# Patient Record
Sex: Female | Born: 1960 | Race: Black or African American | Hispanic: No | Smoking: Never smoker
Health system: Southern US, Community
[De-identification: ages and names within clinical notes are randomized; demographics above are authoritative.]

## PROBLEM LIST (undated history)

## (undated) DIAGNOSIS — F329 Major depressive disorder, single episode, unspecified: Secondary | ICD-10-CM

## (undated) DIAGNOSIS — I10 Essential (primary) hypertension: Secondary | ICD-10-CM

## (undated) DIAGNOSIS — M1990 Unspecified osteoarthritis, unspecified site: Secondary | ICD-10-CM

## (undated) DIAGNOSIS — I714 Abdominal aortic aneurysm, without rupture, unspecified: Secondary | ICD-10-CM

## (undated) DIAGNOSIS — F32A Depression, unspecified: Secondary | ICD-10-CM

## (undated) DIAGNOSIS — F419 Anxiety disorder, unspecified: Secondary | ICD-10-CM

## (undated) DIAGNOSIS — R011 Cardiac murmur, unspecified: Secondary | ICD-10-CM

## (undated) DIAGNOSIS — D860 Sarcoidosis of lung: Secondary | ICD-10-CM

## (undated) DIAGNOSIS — D126 Benign neoplasm of colon, unspecified: Secondary | ICD-10-CM

## (undated) DIAGNOSIS — K279 Peptic ulcer, site unspecified, unspecified as acute or chronic, without hemorrhage or perforation: Secondary | ICD-10-CM

## (undated) DIAGNOSIS — I4892 Unspecified atrial flutter: Secondary | ICD-10-CM

## (undated) DIAGNOSIS — T7840XA Allergy, unspecified, initial encounter: Secondary | ICD-10-CM

## (undated) DIAGNOSIS — H269 Unspecified cataract: Secondary | ICD-10-CM

## (undated) DIAGNOSIS — E042 Nontoxic multinodular goiter: Secondary | ICD-10-CM

## (undated) HISTORY — PX: OTHER SURGICAL HISTORY: SHX169

## (undated) HISTORY — DX: Depression, unspecified: F32.A

## (undated) HISTORY — PX: BUNIONECTOMY: SHX129

## (undated) HISTORY — PX: GLAUCOMA SURGERY: SHX656

## (undated) HISTORY — DX: Unspecified osteoarthritis, unspecified site: M19.90

## (undated) HISTORY — PX: DENTAL SURGERY: SHX609

## (undated) HISTORY — DX: Sarcoidosis of lung: D86.0

## (undated) HISTORY — DX: Abdominal aortic aneurysm, without rupture: I71.4

## (undated) HISTORY — PX: CATARACT EXTRACTION: SUR2

## (undated) HISTORY — DX: Essential (primary) hypertension: I10

## (undated) HISTORY — DX: Allergy, unspecified, initial encounter: T78.40XA

## (undated) HISTORY — PX: MYOMECTOMY: SHX85

## (undated) HISTORY — PX: DILATION AND CURETTAGE OF UTERUS: SHX78

## (undated) HISTORY — DX: Anxiety disorder, unspecified: F41.9

## (undated) HISTORY — DX: Nontoxic multinodular goiter: E04.2

## (undated) HISTORY — DX: Peptic ulcer, site unspecified, unspecified as acute or chronic, without hemorrhage or perforation: K27.9

## (undated) HISTORY — DX: Major depressive disorder, single episode, unspecified: F32.9

## (undated) HISTORY — DX: Benign neoplasm of colon, unspecified: D12.6

## (undated) HISTORY — DX: Cardiac murmur, unspecified: R01.1

## (undated) HISTORY — DX: Unspecified atrial flutter: I48.92

## (undated) HISTORY — DX: Unspecified cataract: H26.9

## (undated) HISTORY — DX: Abdominal aortic aneurysm, without rupture, unspecified: I71.40

---

## 1997-04-13 ENCOUNTER — Ambulatory Visit (HOSPITAL_COMMUNITY): Admission: RE | Admit: 1997-04-13 | Discharge: 1997-04-13 | Payer: Self-pay | Admitting: Obstetrics and Gynecology

## 1998-12-29 ENCOUNTER — Other Ambulatory Visit: Admission: RE | Admit: 1998-12-29 | Discharge: 1998-12-29 | Payer: Self-pay | Admitting: Obstetrics and Gynecology

## 2000-01-22 ENCOUNTER — Other Ambulatory Visit: Admission: RE | Admit: 2000-01-22 | Discharge: 2000-01-22 | Payer: Self-pay | Admitting: Obstetrics and Gynecology

## 2000-05-01 ENCOUNTER — Encounter: Payer: Self-pay | Admitting: Internal Medicine

## 2000-09-26 ENCOUNTER — Ambulatory Visit: Admission: RE | Admit: 2000-09-26 | Discharge: 2000-09-26 | Payer: Self-pay | Admitting: Critical Care Medicine

## 2000-11-27 ENCOUNTER — Encounter: Admission: RE | Admit: 2000-11-27 | Discharge: 2001-02-25 | Payer: Self-pay | Admitting: Internal Medicine

## 2001-02-13 ENCOUNTER — Other Ambulatory Visit: Admission: RE | Admit: 2001-02-13 | Discharge: 2001-02-13 | Payer: Self-pay | Admitting: Obstetrics and Gynecology

## 2001-03-04 ENCOUNTER — Encounter: Admission: RE | Admit: 2001-03-04 | Discharge: 2001-06-02 | Payer: Self-pay | Admitting: Internal Medicine

## 2001-05-19 ENCOUNTER — Encounter: Payer: Self-pay | Admitting: Surgery

## 2001-05-19 ENCOUNTER — Encounter (INDEPENDENT_AMBULATORY_CARE_PROVIDER_SITE_OTHER): Payer: Self-pay | Admitting: Specialist

## 2001-05-19 ENCOUNTER — Encounter: Admission: RE | Admit: 2001-05-19 | Discharge: 2001-05-19 | Payer: Self-pay | Admitting: Surgery

## 2001-09-07 ENCOUNTER — Encounter: Admission: RE | Admit: 2001-09-07 | Discharge: 2001-10-28 | Payer: Self-pay | Admitting: Internal Medicine

## 2001-11-09 ENCOUNTER — Encounter: Admission: RE | Admit: 2001-11-09 | Discharge: 2002-02-07 | Payer: Self-pay | Admitting: Internal Medicine

## 2002-02-26 ENCOUNTER — Other Ambulatory Visit: Admission: RE | Admit: 2002-02-26 | Discharge: 2002-02-26 | Payer: Self-pay | Admitting: Obstetrics and Gynecology

## 2002-05-07 ENCOUNTER — Ambulatory Visit (HOSPITAL_BASED_OUTPATIENT_CLINIC_OR_DEPARTMENT_OTHER): Admission: RE | Admit: 2002-05-07 | Discharge: 2002-05-07 | Payer: Self-pay | Admitting: Critical Care Medicine

## 2002-09-22 ENCOUNTER — Emergency Department (HOSPITAL_COMMUNITY): Admission: EM | Admit: 2002-09-22 | Discharge: 2002-09-22 | Payer: Self-pay | Admitting: Emergency Medicine

## 2003-03-25 ENCOUNTER — Other Ambulatory Visit: Admission: RE | Admit: 2003-03-25 | Discharge: 2003-03-25 | Payer: Self-pay | Admitting: Obstetrics and Gynecology

## 2003-11-17 ENCOUNTER — Ambulatory Visit: Payer: Self-pay | Admitting: Internal Medicine

## 2004-01-27 ENCOUNTER — Ambulatory Visit: Payer: Self-pay | Admitting: Internal Medicine

## 2004-02-17 ENCOUNTER — Ambulatory Visit: Payer: Self-pay | Admitting: Internal Medicine

## 2004-02-21 ENCOUNTER — Ambulatory Visit: Payer: Self-pay | Admitting: Critical Care Medicine

## 2004-03-18 ENCOUNTER — Emergency Department (HOSPITAL_COMMUNITY): Admission: EM | Admit: 2004-03-18 | Discharge: 2004-03-19 | Payer: Self-pay | Admitting: Emergency Medicine

## 2004-04-03 ENCOUNTER — Other Ambulatory Visit: Admission: RE | Admit: 2004-04-03 | Discharge: 2004-04-03 | Payer: Self-pay | Admitting: Obstetrics and Gynecology

## 2004-05-03 ENCOUNTER — Ambulatory Visit: Payer: Self-pay | Admitting: Internal Medicine

## 2004-07-26 ENCOUNTER — Ambulatory Visit: Payer: Self-pay | Admitting: Critical Care Medicine

## 2004-08-06 ENCOUNTER — Ambulatory Visit: Payer: Self-pay | Admitting: Cardiology

## 2004-08-07 ENCOUNTER — Ambulatory Visit: Payer: Self-pay | Admitting: Internal Medicine

## 2004-09-03 ENCOUNTER — Ambulatory Visit: Payer: Self-pay | Admitting: Critical Care Medicine

## 2004-11-05 ENCOUNTER — Ambulatory Visit: Payer: Self-pay | Admitting: Internal Medicine

## 2005-01-30 ENCOUNTER — Ambulatory Visit: Payer: Self-pay | Admitting: Critical Care Medicine

## 2005-02-07 ENCOUNTER — Ambulatory Visit: Payer: Self-pay | Admitting: Internal Medicine

## 2005-05-08 ENCOUNTER — Ambulatory Visit: Payer: Self-pay | Admitting: Internal Medicine

## 2005-05-08 ENCOUNTER — Other Ambulatory Visit: Admission: RE | Admit: 2005-05-08 | Discharge: 2005-05-08 | Payer: Self-pay | Admitting: Obstetrics and Gynecology

## 2005-05-16 ENCOUNTER — Ambulatory Visit: Payer: Self-pay | Admitting: Critical Care Medicine

## 2005-07-01 ENCOUNTER — Encounter: Admission: RE | Admit: 2005-07-01 | Discharge: 2005-07-01 | Payer: Self-pay | Admitting: Obstetrics and Gynecology

## 2005-08-22 ENCOUNTER — Ambulatory Visit: Payer: Self-pay | Admitting: Internal Medicine

## 2005-09-25 ENCOUNTER — Ambulatory Visit: Payer: Self-pay | Admitting: Critical Care Medicine

## 2005-10-02 ENCOUNTER — Ambulatory Visit: Payer: Self-pay | Admitting: Internal Medicine

## 2005-10-21 ENCOUNTER — Ambulatory Visit: Payer: Self-pay | Admitting: Critical Care Medicine

## 2005-11-28 ENCOUNTER — Ambulatory Visit: Payer: Self-pay | Admitting: Gastroenterology

## 2005-12-26 ENCOUNTER — Ambulatory Visit: Payer: Self-pay | Admitting: Gastroenterology

## 2006-01-02 ENCOUNTER — Ambulatory Visit: Payer: Self-pay | Admitting: Internal Medicine

## 2006-01-02 LAB — CONVERTED CEMR LAB
ALT: 23 units/L (ref 0–40)
AST: 25 units/L (ref 0–37)
Albumin: 3.3 g/dL — ABNORMAL LOW (ref 3.5–5.2)
Alkaline Phosphatase: 42 units/L (ref 39–117)
Basophils Absolute: 0.1 10*3/uL (ref 0.0–0.1)
Basophils Relative: 1.2 % — ABNORMAL HIGH (ref 0.0–1.0)
Chloride: 103 meq/L (ref 96–112)
Cholesterol: 137 mg/dL (ref 0–200)
Creatinine, Ser: 0.9 mg/dL (ref 0.4–1.2)
Eosinophil percent: 2.1 % (ref 0.0–5.0)
GFR calc non Af Amer: 72 mL/min
Glomerular Filtration Rate, Af Am: 87 mL/min/{1.73_m2}
Hemoglobin, Urine: NEGATIVE
Ketones, ur: NEGATIVE mg/dL
MCHC: 34.1 g/dL (ref 30.0–36.0)
Nitrite: NEGATIVE
Sodium: 137 meq/L (ref 135–145)
Specific Gravity, Urine: <=1.005 (ref 1.000–1.03)
TSH: 1.36 microintl units/mL (ref 0.35–5.50)
Total Protein, Urine: NEGATIVE mg/dL
Total Protein: 6.8 g/dL (ref 6.0–8.3)
Triglyceride fasting, serum: 86 mg/dL (ref 0–149)
Urine Glucose: NEGATIVE mg/dL
WBC: 7.2 10*3/uL (ref 4.5–10.5)
pH: 7 (ref 5.0–8.0)

## 2006-01-23 ENCOUNTER — Ambulatory Visit: Payer: Self-pay | Admitting: Critical Care Medicine

## 2006-02-19 ENCOUNTER — Ambulatory Visit: Payer: Self-pay | Admitting: Internal Medicine

## 2006-02-19 LAB — CONVERTED CEMR LAB
BUN: 8 mg/dL (ref 6–23)
Calcium: 9.5 mg/dL (ref 8.4–10.5)
Chloride: 108 meq/L (ref 96–112)
Creatinine, Ser: 0.9 mg/dL (ref 0.4–1.2)

## 2006-04-03 ENCOUNTER — Ambulatory Visit: Payer: Self-pay | Admitting: Internal Medicine

## 2006-04-03 LAB — CONVERTED CEMR LAB
BUN: 8 mg/dL (ref 6–23)
CO2: 29 meq/L (ref 19–32)
Calcium: 9.5 mg/dL (ref 8.4–10.5)
GFR calc non Af Amer: 82 mL/min
PRA: 2
Potassium Urine: 25 mmol/L
Potassium: 3.9 meq/L (ref 3.5–5.1)
TSH: 1.53 microintl units/mL (ref 0.35–5.50)

## 2006-04-14 ENCOUNTER — Encounter: Payer: Self-pay | Admitting: Internal Medicine

## 2006-04-14 LAB — CONVERTED CEMR LAB: Dopamine 24 Hr Urine: 145 mcg/24hr (ref <500)

## 2006-06-05 ENCOUNTER — Ambulatory Visit: Payer: Self-pay | Admitting: Critical Care Medicine

## 2006-07-07 LAB — HM MAMMOGRAPHY: HM Mammogram: NORMAL

## 2006-08-12 ENCOUNTER — Ambulatory Visit: Payer: Self-pay | Admitting: Internal Medicine

## 2006-08-12 LAB — CONVERTED CEMR LAB
Bacteria, UA: NEGATIVE
Bilirubin Urine: NEGATIVE
Crystals: NEGATIVE
Hemoglobin, Urine: NEGATIVE
Ketones, ur: NEGATIVE mg/dL
Mucus, UA: NEGATIVE
Nitrite: NEGATIVE
RBC / HPF: NONE SEEN
Specific Gravity, Urine: <=1.005 (ref 1.000–1.03)
Total Protein, Urine: NEGATIVE mg/dL
Urine Glucose: NEGATIVE mg/dL
Urobilinogen, UA: 0.2 (ref 0.0–1.0)
pH: 7 (ref 5.0–8.0)

## 2006-09-05 ENCOUNTER — Ambulatory Visit: Payer: Self-pay | Admitting: Critical Care Medicine

## 2006-11-06 DIAGNOSIS — H409 Unspecified glaucoma: Secondary | ICD-10-CM | POA: Insufficient documentation

## 2006-11-06 DIAGNOSIS — M810 Age-related osteoporosis without current pathological fracture: Secondary | ICD-10-CM | POA: Insufficient documentation

## 2006-11-06 DIAGNOSIS — I10 Essential (primary) hypertension: Secondary | ICD-10-CM | POA: Insufficient documentation

## 2006-12-05 ENCOUNTER — Telehealth: Payer: Self-pay | Admitting: Critical Care Medicine

## 2006-12-09 ENCOUNTER — Ambulatory Visit: Payer: Self-pay | Admitting: Critical Care Medicine

## 2006-12-10 ENCOUNTER — Ambulatory Visit: Payer: Self-pay | Admitting: Internal Medicine

## 2006-12-10 DIAGNOSIS — J4540 Moderate persistent asthma, uncomplicated: Secondary | ICD-10-CM | POA: Insufficient documentation

## 2006-12-10 DIAGNOSIS — B353 Tinea pedis: Secondary | ICD-10-CM | POA: Insufficient documentation

## 2006-12-10 DIAGNOSIS — F418 Other specified anxiety disorders: Secondary | ICD-10-CM | POA: Insufficient documentation

## 2006-12-19 ENCOUNTER — Encounter: Payer: Self-pay | Admitting: Internal Medicine

## 2007-03-11 ENCOUNTER — Ambulatory Visit: Payer: Self-pay | Admitting: Internal Medicine

## 2007-03-18 LAB — CONVERTED CEMR LAB
Basophils Absolute: 0.1 10*3/uL (ref 0.0–0.1)
Bilirubin, Direct: 0.1 mg/dL (ref 0.0–0.3)
Calcium: 9.1 mg/dL (ref 8.4–10.5)
Eosinophils Absolute: 0.2 10*3/uL (ref 0.0–0.6)
Eosinophils Relative: 2.2 % (ref 0.0–5.0)
GFR calc Af Amer: 99 mL/min
GFR calc non Af Amer: 82 mL/min
Glucose, Bld: 87 mg/dL (ref 70–99)
Lymphocytes Relative: 23.5 % (ref 12.0–46.0)
MCHC: 33 g/dL (ref 30.0–36.0)
MCV: 91.4 fL (ref 78.0–100.0)
Neutro Abs: 4.3 10*3/uL (ref 1.4–7.7)
Neutrophils Relative %: 60.6 % (ref 43.0–77.0)
Platelets: 343 10*3/uL (ref 150–400)
Sodium: 139 meq/L (ref 135–145)
TSH: 1.73 microintl units/mL (ref 0.35–5.50)
WBC: 7 10*3/uL (ref 4.5–10.5)

## 2007-03-24 ENCOUNTER — Ambulatory Visit: Payer: Self-pay | Admitting: Internal Medicine

## 2007-03-24 DIAGNOSIS — M1990 Unspecified osteoarthritis, unspecified site: Secondary | ICD-10-CM | POA: Insufficient documentation

## 2007-03-24 DIAGNOSIS — J309 Allergic rhinitis, unspecified: Secondary | ICD-10-CM | POA: Insufficient documentation

## 2007-03-24 DIAGNOSIS — M79609 Pain in unspecified limb: Secondary | ICD-10-CM | POA: Insufficient documentation

## 2007-04-08 ENCOUNTER — Telehealth: Payer: Self-pay | Admitting: Internal Medicine

## 2007-04-09 ENCOUNTER — Ambulatory Visit: Payer: Self-pay | Admitting: Internal Medicine

## 2007-04-09 DIAGNOSIS — R002 Palpitations: Secondary | ICD-10-CM | POA: Insufficient documentation

## 2007-04-10 ENCOUNTER — Telehealth: Payer: Self-pay | Admitting: Internal Medicine

## 2007-04-13 ENCOUNTER — Telehealth: Payer: Self-pay | Admitting: Internal Medicine

## 2007-05-05 ENCOUNTER — Encounter: Payer: Self-pay | Admitting: Critical Care Medicine

## 2007-05-18 ENCOUNTER — Ambulatory Visit: Payer: Self-pay | Admitting: Critical Care Medicine

## 2007-06-18 ENCOUNTER — Ambulatory Visit: Payer: Self-pay | Admitting: Internal Medicine

## 2007-06-28 DIAGNOSIS — D8685 Sarcoid myocarditis: Secondary | ICD-10-CM | POA: Insufficient documentation

## 2007-06-28 DIAGNOSIS — D869 Sarcoidosis, unspecified: Secondary | ICD-10-CM

## 2007-07-16 ENCOUNTER — Telehealth (INDEPENDENT_AMBULATORY_CARE_PROVIDER_SITE_OTHER): Payer: Self-pay | Admitting: *Deleted

## 2007-07-16 ENCOUNTER — Ambulatory Visit: Payer: Self-pay | Admitting: Internal Medicine

## 2007-07-17 ENCOUNTER — Ambulatory Visit: Payer: Self-pay | Admitting: Family Medicine

## 2007-07-17 DIAGNOSIS — M6740 Ganglion, unspecified site: Secondary | ICD-10-CM | POA: Insufficient documentation

## 2007-08-05 ENCOUNTER — Ambulatory Visit: Payer: Self-pay | Admitting: Internal Medicine

## 2007-08-05 LAB — CONVERTED CEMR LAB
Basophils Absolute: 0.1 10*3/uL (ref 0.0–0.1)
Calcium: 9.9 mg/dL (ref 8.4–10.5)
Cholesterol: 139 mg/dL (ref 0–200)
GFR calc Af Amer: 62 mL/min
Glucose, Bld: 98 mg/dL (ref 70–99)
HCT: 39.7 % (ref 36.0–46.0)
Hemoglobin: 13.7 g/dL (ref 12.0–15.0)
MCHC: 34.6 g/dL (ref 30.0–36.0)
Monocytes Absolute: 0.8 10*3/uL (ref 0.1–1.0)
Monocytes Relative: 10.3 % (ref 3.0–12.0)
Neutro Abs: 5 10*3/uL (ref 1.4–7.7)
RDW: 13.9 % (ref 11.5–14.6)
Sodium: 142 meq/L (ref 135–145)
TSH: 2.18 microintl units/mL (ref 0.35–5.50)
Triglycerides: 109 mg/dL (ref 0–149)

## 2007-08-19 ENCOUNTER — Ambulatory Visit: Payer: Self-pay | Admitting: Internal Medicine

## 2007-09-18 ENCOUNTER — Ambulatory Visit: Payer: Self-pay | Admitting: Critical Care Medicine

## 2007-10-16 ENCOUNTER — Encounter: Payer: Self-pay | Admitting: Internal Medicine

## 2007-10-21 ENCOUNTER — Encounter (INDEPENDENT_AMBULATORY_CARE_PROVIDER_SITE_OTHER): Payer: Self-pay | Admitting: Obstetrics and Gynecology

## 2007-10-21 ENCOUNTER — Ambulatory Visit (HOSPITAL_COMMUNITY): Admission: RE | Admit: 2007-10-21 | Discharge: 2007-10-21 | Payer: Self-pay | Admitting: Obstetrics and Gynecology

## 2007-11-09 ENCOUNTER — Ambulatory Visit: Payer: Self-pay | Admitting: Internal Medicine

## 2007-11-09 ENCOUNTER — Ambulatory Visit: Payer: Self-pay | Admitting: Gastroenterology

## 2007-11-09 DIAGNOSIS — R198 Other specified symptoms and signs involving the digestive system and abdomen: Secondary | ICD-10-CM | POA: Insufficient documentation

## 2007-12-02 ENCOUNTER — Ambulatory Visit: Payer: Self-pay | Admitting: Internal Medicine

## 2007-12-02 LAB — CONVERTED CEMR LAB
ALT: 18 units/L (ref 0–35)
Albumin: 3.5 g/dL (ref 3.5–5.2)
CO2: 28 meq/L (ref 19–32)
Calcium: 9.1 mg/dL (ref 8.4–10.5)
Creatinine, Ser: 1 mg/dL (ref 0.4–1.2)
Glucose, Bld: 93 mg/dL (ref 70–99)
TSH: 1.99 microintl units/mL (ref 0.35–5.50)
Total Bilirubin: 0.6 mg/dL (ref 0.3–1.2)
Total Protein: 6.9 g/dL (ref 6.0–8.3)

## 2007-12-07 ENCOUNTER — Ambulatory Visit: Payer: Self-pay | Admitting: Internal Medicine

## 2007-12-07 DIAGNOSIS — R5381 Other malaise: Secondary | ICD-10-CM | POA: Insufficient documentation

## 2007-12-07 DIAGNOSIS — R000 Tachycardia, unspecified: Secondary | ICD-10-CM | POA: Insufficient documentation

## 2007-12-07 DIAGNOSIS — R5383 Other fatigue: Secondary | ICD-10-CM

## 2007-12-28 ENCOUNTER — Ambulatory Visit: Payer: Self-pay | Admitting: Internal Medicine

## 2007-12-28 ENCOUNTER — Telehealth: Payer: Self-pay | Admitting: Internal Medicine

## 2007-12-29 ENCOUNTER — Telehealth: Payer: Self-pay | Admitting: Internal Medicine

## 2008-02-08 ENCOUNTER — Ambulatory Visit: Payer: Self-pay | Admitting: Internal Medicine

## 2008-02-11 ENCOUNTER — Telehealth: Payer: Self-pay | Admitting: Internal Medicine

## 2008-03-09 ENCOUNTER — Ambulatory Visit: Payer: Self-pay | Admitting: Internal Medicine

## 2008-03-22 ENCOUNTER — Encounter: Payer: Self-pay | Admitting: Internal Medicine

## 2008-05-03 ENCOUNTER — Ambulatory Visit: Payer: Self-pay | Admitting: Critical Care Medicine

## 2008-05-05 ENCOUNTER — Encounter: Payer: Self-pay | Admitting: Critical Care Medicine

## 2008-05-23 ENCOUNTER — Telehealth: Payer: Self-pay | Admitting: Internal Medicine

## 2008-05-24 ENCOUNTER — Ambulatory Visit: Payer: Self-pay | Admitting: Internal Medicine

## 2008-05-24 ENCOUNTER — Telehealth: Payer: Self-pay | Admitting: Internal Medicine

## 2008-05-24 DIAGNOSIS — N959 Unspecified menopausal and perimenopausal disorder: Secondary | ICD-10-CM | POA: Insufficient documentation

## 2008-05-26 LAB — CONVERTED CEMR LAB
AST: 28 units/L (ref 0–37)
Alkaline Phosphatase: 60 units/L (ref 39–117)
Basophils Absolute: 0 10*3/uL (ref 0.0–0.1)
Ketones, ur: NEGATIVE mg/dL
Lymphocytes Relative: 24 % (ref 12.0–46.0)
Monocytes Relative: 8.1 % (ref 3.0–12.0)
Neutrophils Relative %: 64.2 % (ref 43.0–77.0)
Platelets: 267 10*3/uL (ref 150.0–400.0)
RDW: 13.7 % (ref 11.5–14.6)
Specific Gravity, Urine: <=1.005 (ref 1.000–1.030)
Total Bilirubin: 0.7 mg/dL (ref 0.3–1.2)
Urine Glucose: NEGATIVE mg/dL
Vitamin B-12: 1147 pg/mL — ABNORMAL HIGH (ref 211–911)
pH: 7.5 (ref 5.0–8.0)

## 2008-06-08 ENCOUNTER — Ambulatory Visit: Payer: Self-pay | Admitting: Internal Medicine

## 2008-07-06 ENCOUNTER — Telehealth: Payer: Self-pay | Admitting: Internal Medicine

## 2008-08-29 ENCOUNTER — Ambulatory Visit: Payer: Self-pay | Admitting: Internal Medicine

## 2008-08-29 LAB — CONVERTED CEMR LAB
CO2: 32 meq/L (ref 19–32)
Calcium: 9.4 mg/dL (ref 8.4–10.5)
Chloride: 100 meq/L (ref 96–112)
Sodium: 139 meq/L (ref 135–145)
TSH: 2.2 microintl units/mL (ref 0.35–5.50)

## 2008-08-31 ENCOUNTER — Ambulatory Visit: Payer: Self-pay | Admitting: Internal Medicine

## 2008-08-31 ENCOUNTER — Ambulatory Visit: Payer: Self-pay | Admitting: Critical Care Medicine

## 2008-09-06 ENCOUNTER — Ambulatory Visit: Payer: Self-pay | Admitting: Internal Medicine

## 2008-09-15 ENCOUNTER — Ambulatory Visit: Payer: Self-pay | Admitting: Critical Care Medicine

## 2008-09-16 ENCOUNTER — Encounter: Payer: Self-pay | Admitting: Critical Care Medicine

## 2008-11-21 ENCOUNTER — Ambulatory Visit: Payer: Self-pay | Admitting: Internal Medicine

## 2008-11-22 LAB — CONVERTED CEMR LAB
Calcium: 9.3 mg/dL (ref 8.4–10.5)
GFR calc non Af Amer: 97.91 mL/min (ref >60)
Glucose, Bld: 103 mg/dL — ABNORMAL HIGH (ref 70–99)
Potassium: 3.2 meq/L — ABNORMAL LOW (ref 3.5–5.1)
Sodium: 140 meq/L (ref 135–145)

## 2008-11-24 ENCOUNTER — Ambulatory Visit: Payer: Self-pay | Admitting: Internal Medicine

## 2008-11-24 DIAGNOSIS — E876 Hypokalemia: Secondary | ICD-10-CM | POA: Insufficient documentation

## 2009-01-04 ENCOUNTER — Telehealth (INDEPENDENT_AMBULATORY_CARE_PROVIDER_SITE_OTHER): Payer: Self-pay | Admitting: *Deleted

## 2009-01-11 ENCOUNTER — Telehealth: Payer: Self-pay | Admitting: Internal Medicine

## 2009-01-11 ENCOUNTER — Ambulatory Visit: Payer: Self-pay | Admitting: Critical Care Medicine

## 2009-01-11 DIAGNOSIS — J018 Other acute sinusitis: Secondary | ICD-10-CM | POA: Insufficient documentation

## 2009-01-23 ENCOUNTER — Telehealth: Payer: Self-pay | Admitting: Pulmonary Disease

## 2009-01-26 ENCOUNTER — Telehealth: Payer: Self-pay | Admitting: Gastroenterology

## 2009-02-20 ENCOUNTER — Ambulatory Visit: Payer: Self-pay | Admitting: Internal Medicine

## 2009-02-20 LAB — CONVERTED CEMR LAB
BUN: 10 mg/dL (ref 6–23)
Chloride: 103 meq/L (ref 96–112)
GFR calc non Af Amer: 75.61 mL/min (ref >60)
Potassium: 3.9 meq/L (ref 3.5–5.1)
Sodium: 144 meq/L (ref 135–145)
TSH: 3.06 microintl units/mL (ref 0.35–5.50)

## 2009-02-23 ENCOUNTER — Ambulatory Visit: Payer: Self-pay | Admitting: Internal Medicine

## 2009-02-23 DIAGNOSIS — J019 Acute sinusitis, unspecified: Secondary | ICD-10-CM | POA: Insufficient documentation

## 2009-02-23 DIAGNOSIS — F4321 Adjustment disorder with depressed mood: Secondary | ICD-10-CM | POA: Insufficient documentation

## 2009-03-06 ENCOUNTER — Telehealth (INDEPENDENT_AMBULATORY_CARE_PROVIDER_SITE_OTHER): Payer: Self-pay | Admitting: *Deleted

## 2009-03-06 ENCOUNTER — Telehealth: Payer: Self-pay | Admitting: Critical Care Medicine

## 2009-04-18 ENCOUNTER — Ambulatory Visit: Payer: Self-pay | Admitting: Internal Medicine

## 2009-04-18 LAB — CONVERTED CEMR LAB
GFR calc non Af Amer: 75.56 mL/min (ref >60)
Potassium: 3.6 meq/L (ref 3.5–5.1)
Sodium: 141 meq/L (ref 135–145)

## 2009-04-23 LAB — CONVERTED CEMR LAB
Alkaline Phosphatase: 71 units/L (ref 39–117)
Bilirubin, Direct: 0 mg/dL (ref 0.0–0.3)
TSH: 2.02 microintl units/mL (ref 0.35–5.50)

## 2009-04-25 ENCOUNTER — Ambulatory Visit: Payer: Self-pay | Admitting: Critical Care Medicine

## 2009-06-30 ENCOUNTER — Encounter: Payer: Self-pay | Admitting: Internal Medicine

## 2009-07-03 ENCOUNTER — Ambulatory Visit: Payer: Self-pay | Admitting: Pulmonary Disease

## 2009-07-06 ENCOUNTER — Telehealth: Payer: Self-pay | Admitting: Critical Care Medicine

## 2009-07-18 ENCOUNTER — Ambulatory Visit: Payer: Self-pay | Admitting: Internal Medicine

## 2009-07-18 LAB — CONVERTED CEMR LAB
BUN: 15 mg/dL (ref 6–23)
Chloride: 104 meq/L (ref 96–112)
Glucose, Bld: 102 mg/dL — ABNORMAL HIGH (ref 70–99)
Potassium: 3.8 meq/L (ref 3.5–5.1)

## 2009-07-20 ENCOUNTER — Ambulatory Visit: Payer: Self-pay | Admitting: Internal Medicine

## 2009-09-04 ENCOUNTER — Ambulatory Visit: Payer: Self-pay | Admitting: Critical Care Medicine

## 2009-09-20 ENCOUNTER — Ambulatory Visit: Payer: Self-pay | Admitting: Critical Care Medicine

## 2009-09-22 ENCOUNTER — Encounter: Payer: Self-pay | Admitting: Critical Care Medicine

## 2009-10-02 ENCOUNTER — Telehealth: Payer: Self-pay | Admitting: Internal Medicine

## 2009-10-20 ENCOUNTER — Ambulatory Visit: Payer: Self-pay | Admitting: Internal Medicine

## 2009-10-20 DIAGNOSIS — M26629 Arthralgia of temporomandibular joint, unspecified side: Secondary | ICD-10-CM | POA: Insufficient documentation

## 2009-12-14 ENCOUNTER — Ambulatory Visit: Payer: Self-pay | Admitting: Critical Care Medicine

## 2010-01-31 ENCOUNTER — Ambulatory Visit
Admission: RE | Admit: 2010-01-31 | Discharge: 2010-01-31 | Payer: Self-pay | Source: Home / Self Care | Attending: Internal Medicine | Admitting: Internal Medicine

## 2010-01-31 ENCOUNTER — Other Ambulatory Visit: Payer: Self-pay | Admitting: Internal Medicine

## 2010-01-31 LAB — BASIC METABOLIC PANEL
BUN: 13 mg/dL (ref 6–23)
CO2: 30 mEq/L (ref 19–32)
Calcium: 10.1 mg/dL (ref 8.4–10.5)
Chloride: 104 mEq/L (ref 96–112)
Creatinine, Ser: 0.9 mg/dL (ref 0.4–1.2)
GFR: 80.89 mL/min (ref >60.00)
Glucose, Bld: 84 mg/dL (ref 70–99)
Potassium: 4 mEq/L (ref 3.5–5.1)
Sodium: 141 mEq/L (ref 135–145)

## 2010-01-31 LAB — CBC WITH DIFFERENTIAL/PLATELET
Basophils Absolute: 0.1 10*3/uL (ref 0.0–0.1)
Basophils Relative: 1.1 % (ref 0.0–3.0)
Eosinophils Absolute: 0.1 10*3/uL (ref 0.0–0.7)
Eosinophils Relative: 2.7 % (ref 0.0–5.0)
HCT: 41.5 % (ref 36.0–46.0)
Hemoglobin: 14.3 g/dL (ref 12.0–15.0)
Lymphocytes Relative: 33.1 % (ref 12.0–46.0)
Lymphs Abs: 1.8 10*3/uL (ref 0.7–4.0)
MCHC: 34.5 g/dL (ref 30.0–36.0)
MCV: 93.3 fl (ref 78.0–100.0)
Monocytes Absolute: 0.6 10*3/uL (ref 0.1–1.0)
Monocytes Relative: 11 % (ref 3.0–12.0)
Neutro Abs: 2.8 10*3/uL (ref 1.4–7.7)
Neutrophils Relative %: 52.1 % (ref 43.0–77.0)
Platelets: 314 10*3/uL (ref 150.0–400.0)
RBC: 4.44 Mil/uL (ref 3.87–5.11)
RDW: 14.5 % (ref 11.5–14.6)
WBC: 5.4 10*3/uL (ref 4.5–10.5)

## 2010-01-31 LAB — URINALYSIS, ROUTINE W REFLEX MICROSCOPIC
Bilirubin Urine: NEGATIVE
Hemoglobin, Urine: NEGATIVE
Ketones, ur: NEGATIVE
Nitrite: NEGATIVE
Specific Gravity, Urine: <=1.005 (ref 1.000–1.030)
Total Protein, Urine: NEGATIVE
Urine Glucose: NEGATIVE
Urobilinogen, UA: 0.2 (ref 0.0–1.0)
pH: 7 (ref 5.0–8.0)

## 2010-01-31 LAB — LIPID PANEL
Cholesterol: 158 mg/dL (ref 0–200)
HDL: 38.5 mg/dL — ABNORMAL LOW (ref >39.00)
LDL Cholesterol: 102 mg/dL — ABNORMAL HIGH (ref 0–99)
Total CHOL/HDL Ratio: 4
Triglycerides: 87 mg/dL (ref 0.0–149.0)
VLDL: 17.4 mg/dL (ref 0.0–40.0)

## 2010-01-31 LAB — HEPATIC FUNCTION PANEL
ALT: 37 U/L — ABNORMAL HIGH (ref 0–35)
AST: 31 U/L (ref 0–37)
Albumin: 4 g/dL (ref 3.5–5.2)
Alkaline Phosphatase: 68 U/L (ref 39–117)
Bilirubin, Direct: 0.1 mg/dL (ref 0.0–0.3)
Total Bilirubin: 0.8 mg/dL (ref 0.3–1.2)
Total Protein: 7 g/dL (ref 6.0–8.3)

## 2010-01-31 LAB — TSH: TSH: 2.37 u[IU]/mL (ref 0.35–5.50)

## 2010-02-04 ENCOUNTER — Encounter: Payer: Self-pay | Admitting: Obstetrics and Gynecology

## 2010-02-05 ENCOUNTER — Ambulatory Visit
Admission: RE | Admit: 2010-02-05 | Discharge: 2010-02-05 | Payer: Self-pay | Source: Home / Self Care | Attending: Internal Medicine | Admitting: Internal Medicine

## 2010-02-05 ENCOUNTER — Encounter: Payer: Self-pay | Admitting: Internal Medicine

## 2010-02-08 ENCOUNTER — Encounter: Payer: Self-pay | Admitting: Internal Medicine

## 2010-02-08 ENCOUNTER — Ambulatory Visit
Admission: RE | Admit: 2010-02-08 | Discharge: 2010-02-08 | Payer: Self-pay | Source: Home / Self Care | Attending: Internal Medicine | Admitting: Internal Medicine

## 2010-02-09 ENCOUNTER — Telehealth: Payer: Self-pay | Admitting: Internal Medicine

## 2010-02-11 LAB — CONVERTED CEMR LAB
GFR calc Af Amer: 86 mL/min
GFR calc non Af Amer: 71 mL/min
Magnesium: 1.9 mg/dL (ref 1.5–2.5)
Potassium: 3.4 meq/L — ABNORMAL LOW (ref 3.5–5.1)
Sodium: 138 meq/L (ref 135–145)

## 2010-02-12 ENCOUNTER — Telehealth: Payer: Self-pay | Admitting: Internal Medicine

## 2010-02-13 ENCOUNTER — Ambulatory Visit
Admission: RE | Admit: 2010-02-13 | Discharge: 2010-02-13 | Payer: Self-pay | Source: Home / Self Care | Attending: Internal Medicine | Admitting: Internal Medicine

## 2010-02-13 DIAGNOSIS — R519 Headache, unspecified: Secondary | ICD-10-CM | POA: Insufficient documentation

## 2010-02-13 DIAGNOSIS — R51 Headache: Secondary | ICD-10-CM | POA: Insufficient documentation

## 2010-02-15 NOTE — Progress Notes (Signed)
Summary: meds  Phone Note Call from Patient Call back at [phone]   Caller: Patient Call For: wright Reason for Call: Talk to Nurse, Talk to Doctor Summary of Call: patient feels like she has the flu, says she spoke to nurse earlier regarding this.Wants to discuss meds. Initial call taken by: Eugene Gavia,  March 06, 2009 2:13 PM  Follow-up for Phone Call        ansewered questions for pt about ibuprofen and tylenol, mucinex and cough syrup Follow-up by: Philipp Deputy CMA,  March 06, 2009 4:23 PM

## 2010-02-15 NOTE — Assessment & Plan Note (Signed)
Summary: NP follow up - sarcoid   Primary Provider/Referring Provider:  Plotnikov  CC:  follow up.  states breathing is doing well.  no new complaints.  would like to see cxr from august and states was told it was "different" than previous.[name]  History of Present Illness: Pulmonary OV: [name] is a  50 year-old [place] American  female with a history of sarcoidosis.    January 11, 2009 4:49 PM Pt does ok except at night gets thick mucous in sinuses.   There is some pressure in sinuses. The pt  hurts across the both frontals and r maxillary sinus Pt had depo injection at [facility] two days ago plus mucinex  April 25, 2009 1:33 PM F/u sarcoid.  Dyspnea only with exertion fast or upstairs.  Sinus pressure is less.  Has sneezing and pndrip esp if outdoors.  Needs clariten rx.   This helps the rhinitis.  No real cough and if does is dry.  No f/c/s. Has occ hot flashes.   07/03/09 Acute OV  KC: the pt is a 50 y/o female who comes in today for an acute sick visit.  She has known sarcoidosis that has been fairly stable, but also has recurrent issues with sinusitis.  She began to have sinus issues about 2-3 weeks ago, and was treated at Urgent care with a zpak.  Despite taking this, she has continued to have nasal congestion, postnasal drip, and cough with purulent material.  She also has some chest congestion, as well as doe above her usual baseline.  She denies any f/c/s.  September 04, 2009 11:39 AM Pt seen 6/20 for acute ov and rx levaquin and pulse pred with sinus rinse. Now is better.  Less cough .  Has stress at work.  Wants to exercise more . 12/14/09--Presents for follow up.  States breathing is doing well.  no new complaints.  would like to see cxr from august, states was told it was "different" than previous. We reviewed her xray which showed chronic sarcoid changes w/ slight progression of scarring in RUL. She is active we talked about advancing her exericse as tolerated. Denies chest pain,   orthopnea, hemoptysis, fever, n/v/d, edema, headache.   Medications Prior to Update: 1)  Bystolic 20 Mg Tabs (Nebivolol Hcl) .[name].. 1 By Mouth Qd 2)  Norvasc 5 Mg  Tabs (Amlodipine Besylate) .... 1/2 By Mouth Daily 3)  Valtrex 1 Gm Tabs (Valacyclovir Hcl) .... 1/2 Tab Two Times A Day 4)  Cosopt 2-0.5 %  Soln (Dorzolamide-Timolol) .[name].. 1 Gtt Ou Two Times A Day in Left Eye 5)  K-Vescent 25 Meq  Tbef (Potassium Bicarbonate) .... Take 1 Tablet By Mouth Bid 6)  Claritin 10 Mg Tabs (Loratadine) .... Take 2 By Mouth  Daily 7)  Multivitamins   Tabs (Multiple Vitamin) .... Take 1 Tablet By Mouth Once A Day 8)  Zovirax 5 % Crea (Acyclovir) .... Use As Needed As Dirr 9)  Triamcinolone Acetonide 0.1 % Crea (Triamcinolone Acetonide) .... Apply Bid To Affected Area Prn 10)  Fish Oil  Oil (Fish Oil) .... Two Capsules Once Daily 11)  Hair/skin/nails  Tabs (Multiple Vitamins-Minerals) .... Take 1 Tablet By Mouth Once A Day 12)  Cozaar 100 Mg Tabs (Losartan Potassium) .[name].. 1 By Mouth Qd 13)  Artificial Tears  Soln (Artificial Tear Solution) .... As Needed To Both Eyes 14)  Neti Pot .... Once Daily 15)  L-S-Htp .... Once Daily 16)  Super Cal Meg Vitd 1000mg  K 500  Mg 800 Iu Vitd .... One To Two Tab Daily 17)  Fluticasone Propionate 50 Mcg/act Susp (Fluticasone Propionate) .... Two Puffs Each Nostril  Daily 18)  Voltaren 1 % Gel (Diclofenac Sodium) .... Use Two Times A Day On A Joint(S) 19)  Vimovo 500-20 Mg Tbec (Naproxen-Esomeprazole) .[name].. 1 By Mouth Once Daily - Two Times A Day Pc As Needed Pain  Current Medications (verified): 1)  Bystolic 20 Mg Tabs (Nebivolol Hcl) .[name].. 1 By Mouth Qd 2)  Norvasc 5 Mg  Tabs (Amlodipine Besylate) .... 1/2 By Mouth Daily 3)  Valtrex 1 Gm Tabs (Valacyclovir Hcl) .... 1/2 Tab Two Times A Day 4)  Cosopt 2-0.5 %  Soln (Dorzolamide-Timolol) .[name].. 1 Gtt Ou Two Times A Day in Left Eye 5)  K-Vescent 25 Meq  Tbef (Potassium Bicarbonate) .... Take 1 Tablet By Mouth Bid 6)  Claritin 10  Mg Tabs (Loratadine) .... Take 2 By Mouth  Daily 7)  Zovirax 5 % Crea (Acyclovir) .... Use As Needed As Dirr 8)  Triamcinolone Acetonide 0.1 % Crea (Triamcinolone Acetonide) .... Apply Twice Daily To Affected Area As Needed 9)  Fish Oil  Oil (Fish Oil) .... Two Capsules Once Daily 10)  Hair/skin/nails  Tabs (Multiple Vitamins-Minerals) .... Take 1 Tablet By Mouth Once A Day 11)  Cozaar 100 Mg Tabs (Losartan Potassium) .[name].. 1 By Mouth Qd 12)  Artificial Tears  Soln (Artificial Tear Solution) .... As Needed To Both Eyes 13)  Neti Pot .... Once Daily 14)  L-S-Htp .... Once Daily 15)  Super Cal Meg Vitd 1000mg  K 500 Mg 800 Iu Vitd .... One To Two Tab Daily 16)  Fluticasone Propionate 50 Mcg/act Susp (Fluticasone Propionate) .... Two Puffs Each Nostril   As Needed 17)  Voltaren 1 % Gel (Diclofenac Sodium) .... Use Two Times A Day On A Joint(S) 18)  Vimovo 500-20 Mg Tbec (Naproxen-Esomeprazole) .[name].. 1 By Mouth Once Daily - Two Times A Day Pc As Needed Pain  Allergies (verified): 1)  ! Sulfa 2)  ! * Flu Shots 3)  ! 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 Simplex Allergic rhinitis Skin test 08/19/07 Osteoarthritis GYN Dr Henderson Cloud  Past Surgical History: Last updated: 05/03/2008 Bunions x 2 Fibroid tumor Dental surgery D+C  R eye glaucoma surgery 2010 Dr Harlon Flor  Family History: Last updated: 02/23/2009 Family History Hypertension CVA, CAD Mother died 5yo- lung cancer, sarcoid  Mother- deceased age 61; sarcoidosis; lung cancer Father- deceased age 77; stroke, Hypertension, heart problems Sibling 1-  living age 68; Hypertension;smoker Sibling 2-living age 51;anemic  Social History: Last updated: 07/20/2009 `Occupation: SW Single/ divorced;no children; 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 Exercise-up to 4 times week Caffeine-1-2 per week  Risk Factors: Smoking Status: never (10/20/2009) Passive Smoke Exposure: yes (10/20/2009)  Review of Systems      See HPI  Vital Signs:  Patient profile:   50 year old female Height:      60 inches Weight:      154.25 pounds BMI:     30.23 O2 Sat:      98 % on Room air Temp:     97.5 degrees F oral Pulse rate:  65 / minute BP sitting:   122 / 74  (left arm) Cuff size:   regular  Vitals Entered By: Boone Master CNA/MA (December 14, 2009 12:04 PM)  O2 Flow:  Room air  Physical Exam  Additional Exam:  Gen: Pleasant, well nourished, in no distress ENT: severe turbinate edema, nares not  purulent  , no postnasal drip Neck: No JVD, no TMG, no carotid bruits Lungs: no use of accessory muscles, no dullness to percussion, clear without rales or rhonchi Cardiovascular: RRR, heart sounds normal, no murmurs or gallops, no peripheral edema Musculoskeletal: No deformities, no cyanosis or clubbing     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   Medications Added to Medication List This Visit: 1)  Triamcinolone Acetonide 0.1 % Crea (Triamcinolone acetonide) .... Apply twice daily to affected area as needed 2)  Fluticasone Propionate 50 Mcg/act Susp (Fluticasone propionate) .... Two puffs each nostril   as needed  Other Orders: Est. Patient Level II (95284)  Patient Instructions: 1)  Continue on same meds.  2)  Advance exercise as tolerated.  3)  follow up Dr. Delford Field in 4 months and as needed

## 2010-02-15 NOTE — Progress Notes (Signed)
Summary: Voltaren Gel   Phone Note Call from Patient Call back at [facility] Phone [phone]   Summary of Call: Patient is requesting rx for voltaren gel. Per pt, she has had this in the past but rx is expired or pharm lost rx.  Initial call taken by: Lamar Sprinkles, CMA,  October 02, 2009 4:45 PM  Follow-up for Phone Call        ok Follow-up by: Tresa Garter MD,  October 02, 2009 6:00 PM  Additional Follow-up for Phone Call Additional follow up Details #1::        Pt informed  Additional Follow-up by: Lamar Sprinkles, CMA,  October 03, 2009 9:24 AM    New/Updated Medications: VOLTAREN 1 % GEL (DICLOFENAC SODIUM) use two times a day on a joint(s) Prescriptions: VOLTAREN 1 % GEL (DICLOFENAC SODIUM) use two times a day on a joint(s)  #90 g x 3   Entered and Authorized by:   Tresa Garter MD   Signed by:   Lamar Sprinkles, CMA on 10/03/2009   Method used:   [facility] to        [facility]  [address] (retail)       [address]       [place], [place]  [address]       Ph: [phone] or [phone]       Fax: [phone]   RxID:   [id number]

## 2010-02-15 NOTE — Miscellaneous (Signed)
Summary: Letter for work  To whom it may concern:   This patient has sarcoidosis  and is under treatment for this since 1993.  Sincerely yours       Shan Levans MD Clinical Lists Changes

## 2010-02-15 NOTE — Miscellaneous (Signed)
Summary: Orders Update pft charges  Clinical Lists Changes  Orders: Added new Service order of Carbon Monoxide diffusing w/capacity (94720) - Signed Added new Service order of Lung Volumes (94240) - Signed Added new Service order of Spirometry (Pre & Post) (94060) - Signed 

## 2010-02-15 NOTE — Progress Notes (Signed)
Summary: fyi  Phone Note Call from Patient Call back at [phone]   Caller: Patient Call For: wright Reason for Call: Talk to Nurse Summary of Call: just to let you know, diagnosed with flu at urgent care on Sunday.  Have appt with [facility] 03/13/2009. Initial call taken by: Angela Coberly,  March 06, 2009 10:55 AM  Follow-up for Phone Call        FYI. Jennifer Castillo CMA  March 06, 2009 11:27 AM   Additional Follow-up for Phone Call Additional follow up Details #1::        Make sure she was given  Tamiflu 75mg two times a day for 5 days by mouth  Additional Follow-up by: Patrick E Wright MD,  March 06, 2009 11:37 AM    Additional Follow-up for Phone Call Additional follow up Details #2::    The was given cough syrup and was told to alternate tylenol and ibuprofen for fever and aches. Pt states symptoms started on Friday evening 03-03-09. She was not given tamiflu. Jennifer Castillo CMA  March 06, 2009 11:42 AM   Additional Follow-up for Phone Call Additional follow up Details #3:: Details for Additional Follow-up Action Taken: Pls Rx Tamiflu 75mg two times a day x 5 days Asap  Additional Follow-up by: Patrick E Wright MD,  March 06, 2009 11:50 AM  New/Updated Medications: TAMIFLU 75 MG CAPS (OSELTAMIVIR PHOSPHATE) two times a day x 5 days Prescriptions: TAMIFLU 75 MG CAPS (OSELTAMIVIR PHOSPHATE) two times a day x 5 days  #10 x 0   Entered by:   Jennifer Castillo CMA   Authorized by:   Patrick E Wright MD   Signed by:   Jennifer Castillo CMA on 03/06/2009   Method used:   [facility]        [facility]  [id number]       [address]       [place], [place]  [address]       Ph: [phone] or [phone]       Fax: [phone]   RxID:   [id number]  rx sent. pt aware to get med and start asap. Carron Curie CMA  March 06, 2009 11:57 AM

## 2010-02-15 NOTE — Progress Notes (Signed)
Summary: TRIAGE   Phone Note Call from Patient Call back at Home Phone [phone]   Caller: Patient Call For: Dr. Arlyce Dice Reason for Call: Talk to Nurse Summary of Call: pt would like medication rx'ed for loose and dark green stools Initial call taken by: Vallarie Mare,  January 26, 2009 10:35 AM  Follow-up for Phone Call        Pt. took 3 days of Augmentin in 12/2008, had diarrhea, so she stopped it. Stools returned to normal.  Now pt. c/o looser stools for 1 week. Has 2 stools daily, always in the morning, stools are very dark green. 1 episode of lower abd. discomfort this morning.  Denies blood, black stools,fever,n/v.   1) Soft,bland diet x2-4 days. Advanced as tolerated. 2) tylenol/Ibuprofen as needed 3) I will call pt., if new orders, after MD reviews.     Follow-up by: Laureen Ochs LPN,  January 26, 2009 11:00 AM  Additional Follow-up for Phone Call Additional follow up Details #1::        c/b if no better next 48 hours, then will need stool for C. dificile Additional Follow-up by: Louis Meckel MD,  January 30, 2009 10:47 AM    Additional Follow-up for Phone Call Additional follow up Details #2::    Message left for patient to callback. Laureen Ochs LPN  January 30, 2009 11:00 AM  Message left for patient to callback. Laureen Ochs LPN  January 30, 2009 3:06 PM  Message left for patient to callback. Laureen Ochs LPN  January 31, 2009 9:14 AM   Pt. states she is much better, her stool color is back to normal and she is eating normally. Pt. instructed to call back as needed.  Follow-up by: Laureen Ochs LPN,  January 31, 2009 2:13 PM

## 2010-02-15 NOTE — Miscellaneous (Signed)
Summary: PFTs   Pulmonary Function Test Date: 09/20/2009 Height (in.): 60 Gender: Female  Pre-Spirometry FVC    Value: 2.35 L/min   Pred: 2.85 L/min     % Pred: 83 % FEV1    Value: 1.98 L     Pred: 2.16 L     % Pred: 92 % FEV1/FVC  Value: 84 %     Pred: 76 %    FEF 25-75  Value: 1.95 L/min   Pred: 2.65 L/min     % Pred: 74 %  Post-Spirometry FVC    Value: 2.44 L/min   Pred: 2.85 L/min     % Pred: 86 % FEV1    Value: 2.05 L     Pred: 2.16 L     % Pred: 95 % FEV1/FVC  Value: 84 %     Pred: 76 %    FEF 25-75  Value: 2.09 L/min   Pred: 2.65 L/min     % Pred: 79 %  Lung Volumes TLC    Value: 3.47 L   % Pred: 83 % RV    Value: 1.08 L   % Pred: 75 % DLCO    Value: 14.5 %   % Pred: 72 % DLCO/VA  Value: 4.54 %   % Pred: 114 %  Comments: Normal spirometry and lung volumes, slight reduction in Dlco compared to 2010 values  Clinical Lists Changes  Observations: Added new observation of PFT COMMENTS: Normal spirometry and lung volumes, slight reduction in Dlco compared to 2010 values (09/22/2009 10:05) Added new observation of DLCO/VA%EXP: 114 % (09/22/2009 10:05) Added new observation of DLCO/VA: 4.54 % (09/22/2009 10:05) Added new observation of DLCO % EXPEC: 72 % (09/22/2009 10:05) Added new observation of DLCO: 14.5 % (09/22/2009 10:05) Added new observation of RV % EXPECT: 75 % (09/22/2009 10:05) Added new observation of RV: 1.08 L (09/22/2009 10:05) Added new observation of TLC % EXPECT: 83 % (09/22/2009 10:05) Added new observation of TLC: 3.47 L (09/22/2009 10:05) Added new observation of FEF2575%EXPS: 79 % (09/22/2009 10:05) Added new observation of PSTFEF25/75P: 2.65  (09/22/2009 10:05) Added new observation of PSTFEF25/75%: 2.09 L/min (09/22/2009 10:05) Added new observation of FEV1FVCPRDPS: 76 % (09/22/2009 10:05) Added new observation of PSTFEV1/FVC: 84 % (09/22/2009 10:05) Added new observation of POSTFEV1%PRD: 95 % (09/22/2009 10:05) Added new observation of FEV1PRDPST:  2.16 L (09/22/2009 10:05) Added new observation of POST FEV1: 2.05 L/min (09/22/2009 10:05) Added new observation of POST FVC%EXP: 86 % (09/22/2009 10:05) Added new observation of FVCPRDPST: 2.85 L/min (09/22/2009 10:05) Added new observation of POST FVC: 2.44 L (09/22/2009 10:05) Added new observation of FEF % EXPEC: 74 % (09/22/2009 10:05) Added new observation of FEF25-75%PRE: 2.65 L/min (09/22/2009 10:05) Added new observation of FEF 25-75%: 1.95 L/min (09/22/2009 10:05) Added new observation of FEV1/FVC PRE: 76 % (09/22/2009 10:05) Added new observation of FEV1/FVC: 84 % (09/22/2009 10:05) Added new observation of FEV1 % EXP: 92 % (09/22/2009 10:05) Added new observation of FEV1 PREDICT: 2.16 L (09/22/2009 10:05) Added new observation of FEV1: 1.98 L (09/22/2009 10:05) Added new observation of FVC % EXPECT: 83 % (09/22/2009 10:05) Added new observation of FVC PREDICT: 2.85 L (09/22/2009 10:05) Added new observation of FVC: 2.35 L (09/22/2009 10:05) Added new observation of PFT DATE: 09/20/2009  (09/22/2009 10:05)  Appended Document: PFTs result noted  patient aware

## 2010-02-15 NOTE — Assessment & Plan Note (Signed)
Summary: acute sick visit for sinusitis   Primary Provider/Referring Provider:  Plotnikov  CC:  Pt is here for a sick visit.  Pw's pt.  Pt was [facility] approx [facility] ago [facility] for sinus infection and has finished abx approx 1 week ago.  Pt c/o increased sob with exertion, coughing up green sputum, PND esp at night, vomited this morning, and and also feeling very "sleepy and tired."  .  History of Present Illness: the pt is a 50 y/o female who comes in today for an acute sick visit.  She has known sarcoidosis that has been fairly stable, but also has recurrent issues with sinusitis.  She began to have sinus issues about 2-3 weeks ago, and was treated at Urgent care with a zpak.  Despite taking this, she has continued to have nasal congestion, postnasal drip, and cough with purulent material.  She also has some chest congestion, as well as doe above her usual baseline.  She denies any f/c/s.  Current Medications (verified): 1)  Bystolic 20 Mg Tabs (Nebivolol Hcl) .[name].. 1 By Mouth Qd 2)  Norvasc 5 Mg  Tabs (Amlodipine Besylate) .... 1/2 By Mouth Daily 3)  Valtrex 1 Gm Tabs (Valacyclovir Hcl) .... 1/2 Tab Two Times A Day 4)  Cosopt 2-0.5 %  Soln (Dorzolamide-Timolol) .[name].. 1 Gtt Ou Two Times A Day in Left Eye 5)  K-Vescent 25 Meq  Tbef (Potassium Bicarbonate) .... Take 1 Tablet By Mouth Bid 6)  Claritin 10 Mg Tabs (Loratadine) .... Take 2 By Mouth  Daily 7)  Multivitamins   Tabs (Multiple Vitamin) .... Take 1 Tablet By Mouth Once A Day 8)  Zovirax 5 % Crea (Acyclovir) .... Use As Needed As Dirr 9)  Triamcinolone Acetonide 0.1 % Crea (Triamcinolone Acetonide) .... Apply Bid To Affected Area Prn 10)  Fish Oil  Oil (Fish Oil) .... Two Capsules Once Daily 11)  Hair/skin/nails  Tabs (Multiple Vitamins-Minerals) .... Take 1 Tablet By Mouth Once A Day 12)  Cozaar 100 Mg Tabs (Losartan Potassium) .[name].. 1 By Mouth Qd 13)  Neti Pot .... As Needed 14)  L-S-Htp .... Once Daily 15)  Super Cal Meg Vitd  1000mg  K 500 Mg 800 Iu Vitd .... One To Two Tab Daily 16)  Artificial Tears  Soln (Artificial Tear Solution) .... As Needed To Right Eye 17)  Astepro 0.15 % Soln (Azelastine Hcl) .... Two Sprays Each Nostril Nightly  Allergies (verified): 1)  ! Sulfa 2)  ! * Flu Shots 3)  ! Augmentin 4)  Cephalexin (Cephalexin) 5)  * Omnicef 6)  Hydrochlorothiazide (Hydrochlorothiazide) 7)  Coreg (Carvedilol) 8)  Sulfamethoxazole (Sulfamethoxazole)  Past History:  Past Medical History: Reviewed history from 09/18/2007 and no changes required. 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 Simplex Allergic rhinitis Skin test 08/19/07 Osteoarthritis  Past Surgical History: Reviewed history from 05/03/2008 and no changes required. Bunions x 2 Fibroid tumor Dental surgery D+C  R eye glaucoma surgery 2010 Dr Harlon Flor  Family History: Reviewed history from 02/23/2009 and no changes required. Family History Hypertension CVA, CAD Mother died 31yo- lung cancer, sarcoid  Mother- deceased age 22; sarcoidosis; lung cancer Father- deceased age 16; stroke, Hypertension, heart problems Sibling 1- living age 69; Hypertension;smoker Sibling 2-living age 21;anemic  Social History: Reviewed history from 06/08/2008 and no changes required. 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 Exercise-up to  4 times week Caffeine-1-2 per week  Review of Systems       The patient complains of shortness of breath with activity, productive cough, sore throat, nasal congestion/difficulty breathing through nose, and change in color of mucus.  The patient denies shortness of breath at rest, non-productive cough, coughing up blood, chest pain, irregular heartbeats, acid heartburn, indigestion, loss of appetite,  weight change, abdominal pain, difficulty swallowing, tooth/dental problems, headaches, sneezing, itching, ear ache, anxiety, depression, hand/feet swelling, joint stiffness or pain, rash, and fever.    Vital Signs:  Patient profile:   50 year old female Height:      60 inches Weight:      147 pounds BMI:     28.81 O2 Sat:      95 % on Room air Temp:     98.2 degrees F oral Pulse rate:   76 / minute BP sitting:   128 / 78  (left arm) Cuff size:   regular  Vitals Entered By: Arman Filter LPN (July 03, 2009 1:28 PM)  O2 Flow:  Room air CC: Pt is here for a sick visit.  Pw's pt.  Pt was [facility]x [facility] for [facility] infection and has finished abx approx 1 week ago.  Pt c/o increased sob with exertion, coughing up green sputum, PND esp at night, vomited this morning, and also feeling very "sleepy and tired."   Comments Medications reviewed with patient Arman Filter LPN  July 03, 2009 1:28 PM    Physical Exam  General:  wd female in nad Nose:  swollen turbinates bilat with significant nasal airway compromise erythematous mucosa Mouth:  mildly erythematious, no exudates or lesions noted. Neck:  no LN noted Lungs:  surprisingly totally clear to auscultation no wheezing or rhonchi Heart:  rrr, no mrg Extremities:  no edema or cyanosis Neurologic:  alert and oriented,moves all 4.   Impression & Recommendations:  Problem # 1:  SINUSITIS, ACUTE (ICD-461.9)  the pt is describing symptoms most c/w acute sinusitis.  She is very congested in her nasal airway, has nasal voice, and I suspect her cough with purulence is coming from the back of her throat.  I suspect the zpak was inadequate treatment, and she may have persistent infection.  She also needs an aggressive nasal hygiene regimen and a course of steroids to reduce nasal airway edema and allow for adequate drainage.  Her chest today is totally clear.  Medications Added to Medication List This Visit: 1)   Hair/skin/nails Tabs (Multiple vitamins-minerals) .... Take 1 tablet by mouth once a day 2)  Levaquin 750 Mg Tabs (Levofloxacin) .... One tablet by mouth daily 3)  Prednisone 10 Mg Tabs (Prednisone) .... Take 4 each day for 2 days, then 3 each day for 2 days, then 2 each day for 2 days, then 1 each day for 2 days, then stop  Other Orders: Est. Patient Level IV (16109)  Patient Instructions: 1)  will treat with levaquin 750mg  one each day for 7 days 2)  will treat with prednisone as directed on prescription for a short course 3)  continue neilmed sinus rinses in am and pm if able 4)  use afrin over the counter 2 sprays in am and pm for 3 days only...use after sinus rinses, but before astepro. 5)  astepro as you are doing. 6)  followup with Dr. Delford Field if not returning to your usual baseline.   Prescriptions: PREDNISONE 10 MG  TABS (PREDNISONE) take  4 each day for 2 days, then 3 each day for 2 days, then 2 each day for 2 days, then 1 each day for 2 days, then stop  #20 x 0   Entered and Authorized by:   Barbaraann Share MD   Signed by:   Barbaraann Share MD on 07/03/2009   Method used:   Print then Give to Patient   RxID:   [id number] LEVAQUIN 750 MG  TABS (LEVOFLOXACIN) One tablet by mouth daily  #7 x 0   Entered and Authorized by:   Barbaraann Share MD   Signed by:   Barbaraann Share MD on 07/03/2009   Method used:   Print then Give to Patient   RxID:   [id number]

## 2010-02-15 NOTE — Miscellaneous (Signed)
Summary: BONE DENSITY  Clinical Lists Changes  Orders: Added new Test order of T-Lumbar Vertebral Assessment (77082) - Signed 

## 2010-02-15 NOTE — Progress Notes (Signed)
Summary: diarrhea  Phone Note Call from Patient Call back at Home Phone [phone]   Caller: Patient Call For: wright Reason for Call: Talk to Nurse Summary of Call: antibiotic gave her diarrhea, augmentin, bowels loose w/ dark green color.  Have not started back, because she doesn't want the diarrhea.  Please resond. Initial call taken by: Eugene Gavia,  January 23, 2009 2:30 PM  Follow-up for Phone Call        VS, please advise as to what abx can be given.Thanks. Reynaldo Minium CMA  January 23, 2009 2:35 PM   Additional Follow-up for Phone Call Additional follow up Details #1::        Patient was seen on 01/11/09 by Dr. Delford Field.  She was given a script for augmentin for 7 days.  She should have completed this by now.  Why is she still taking antibiotics?  If she has completed her course of augmentin, then I am not sure she would need any additional antibiotics.  If she is still having sinus difficulties she would need to come in for an ROV to further evaluate. Additional Follow-up by: Coralyn Helling MD,  January 23, 2009 3:25 PM     Appended Document: diarrhea  Pt aware of VS rec's. States that she only took abx for 3 days; stopped due to side effects;Pt states she will call PCP or Dr. Arlyce Dice regarding green stools. Added Augmentin to her allergy list due to side effects.   Clinical Lists Changes  Allergies: Added new allergy or adverse reaction of AUGMENTIN      Appended Document: diarrhea I agree with VS.  pt needs an OV  pw

## 2010-02-15 NOTE — Assessment & Plan Note (Signed)
Summary: Pulmonary OV   Primary Provider/Referring Provider:  Plotnikov  CC:  4 month follow up.  Pt states breathing is doing " good."  states it is no better or worse.  c/o sneezing and blowing nose with clear mucus and occ drainage x 1 wk.  Denies wheezing and chest tightness.  States she does have an occ dry cough.  Requesting writen rx for claritin so insurance will pay for med.  [name]  History of Present Illness: Pulmonary OV: [name] returns in followup. This is a 50year-old African American female with a history of sarcoidosis.  She is here today for pulmonary f/u.    January 11, 2009 4:49 PM Pt does ok except at night gets thick mucous in sinuses.   There is some pressure in sinuses. The pt  hurts across the both frontals and r maxillary sinus Pt had depo injection at [facility] two days ago plus mucinex  April 25, 2009 1:33 PM F/u sarcoid.  Dyspnea only with exertion fast or upstairs.  Sinus pressure is less.  Has sneezing and pndrip esp if outdoors.  Needs clariten rx.   This helps the rhinitis.  No real cough and if does is dry.  No f/c/s. Has occ hot flashes.    Preventive Screening-Counseling & Management  Alcohol-Tobacco     Smoking Status: never  Current Medications (verified): 1)  Bystolic 20 Mg Tabs (Nebivolol Hcl) .[name].. 1 By Mouth Qd 2)  Norvasc 5 Mg  Tabs (Amlodipine Besylate) .... 1/2 By Mouth Daily 3)  Valtrex 1 Gm Tabs (Valacyclovir Hcl) .... 1/2 Tab Two Times A Day 4)  Cosopt 2-0.5 %  Soln (Dorzolamide-Timolol) .[name].. 1 Gtt Ou Two Times A Day in Left Eye 5)  K-Vescent 25 Meq  Tbef (Potassium Bicarbonate) .... Take 1 Tablet By Mouth Bid 6)  Claritin 10 Mg Tabs (Loratadine) .... Take 2 By Mouth  Daily 7)  Multivitamins   Tabs (Multiple Vitamin) .... Take 1 Tablet By Mouth Once A Day 8)  Zovirax 5 % Crea (Acyclovir) .... Use As Needed As Dirr 9)  Triamcinolone Acetonide 0.1 % Crea (Triamcinolone Acetonide) .... Apply Bid To Affected Area Prn 10)  Fish Oil  Oil (Fish Oil)  .... Two Capsules Once Daily 11)  Hair/skin/nails  Tabs (Multiple Vitamins-Minerals) .... Every Other Day 12)  Cozaar 100 Mg Tabs (Losartan Potassium) .[name].. 1 By Mouth Qd 13)  Neti Pot .... As Needed 14)  L-S-Htp .... Once Daily 15)  Super Cal Meg Vitd 1000mg  K 500 Mg 800 Iu Vitd .... One To Two Tab Daily 16)  Lotrisone 1-0.05 % Crea (Clotrimazole-Betamethasone) .... Use Bid 17)  Artificial Tears  Soln (Artificial Tear Solution) .... As Needed To Right Eye  Allergies (verified): 1)  ! Sulfa 2)  ! * Flu Shots 3)  ! Augmentin 4)  Cephalexin (Cephalexin) 5)  * Omnicef 6)  Hydrochlorothiazide (Hydrochlorothiazide) 7)  Coreg (Carvedilol) 8)  Sulfamethoxazole (Sulfamethoxazole)  Past History:  Past medical, surgical, family and social histories (including risk factors) reviewed, and no changes noted (except as noted below).  Past Medical History: Reviewed history from 09/18/2007 and no changes required. 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 Simplex Allergic rhinitis Skin test 08/19/07 Osteoarthritis  Past Surgical History: Reviewed history from 05/03/2008 and no changes required. Bunions x 2 Fibroid tumor Dental surgery D+C  R eye glaucoma surgery 2010 Dr Harlon Flor  Family History: Reviewed history from  02/23/2009 and no changes required. Family History Hypertension CVA, CAD Mother died 7yo- lung cancer, sarcoid  Mother- deceased age 62; sarcoidosis; lung cancer Father- deceased age 50; stroke, Hypertension, heart problems Sibling 1- living age 91; Hypertension;smoker Sibling 2-living age 71;anemic  Social History: Reviewed history from 06/08/2008 and no changes required. 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 Exercise-up to 4 times week Caffeine-1-2  per week  Review of Systems       The patient complains of shortness of breath with activity, nasal congestion/difficulty breathing through nose, and sneezing.  The patient denies shortness of breath at rest, productive cough, non-productive cough, coughing up blood, chest pain, irregular heartbeats, acid heartburn, indigestion, loss of appetite, weight change, abdominal pain, difficulty swallowing, sore throat, tooth/dental problems, headaches, itching, ear ache, anxiety, depression, hand/feet swelling, joint stiffness or pain, rash, change in color of mucus, and fever.    Vital Signs:  Patient profile:   50 year old female Height:      60 inches Weight:      148.38 pounds BMI:     29.08 O2 Sat:      96 % on Room air Temp:     98.0 degrees F oral Pulse rate:   66 / minute BP sitting:   132 / 78  (right arm) Cuff size:   regular  Vitals Entered By: Gweneth Dimitri RN (April 25, 2009 1:35 PM)  O2 Flow:  Room air CC: 4 month follow up.  Pt states breathing is doing " good."  states it is no better or worse.  c/o sneezing, blowing nose with clear mucus and occ drainage x 1 wk.  Denies wheezing and chest tightness.  States she does have an occ dry cough.  Requesting writen rx for claritin so insurance will pay for med.   Comments Medications reviewed with patient Daytime contact number verified with patient. Gweneth Dimitri RN  April 25, 2009 1:36 PM    Physical Exam  Additional Exam:  Gen: Pleasant, well nourished, in no distress ENT: severe turbinate edema, nares not  purulent  , no postnasal drip Neck: No JVD, no TMG, no carotid bruits Lungs: no use of accessory muscles, no dullness to percussion, clear without rales or rhonchi Cardiovascular: RRR, heart sounds normal, no murmurs or gallops, no peripheral edema Musculoskeletal: No deformities, no cyanosis or clubbing     Impression & Recommendations:  Problem # 1:  ALLERGIC RHINITIS (ICD-477.9) Assessment Deteriorated Severe  allergic rhinitis with flare plan trial astepro two sprays each nostril daily refill claritin cont nasal wash The following medications were removed from the medication list:    Fluticasone Propionate 50 Mcg/act Susp (Fluticasone propionate) .[name]..[name] Two sprays each nostril daily Her updated medication list for this problem includes:    Claritin 10 Mg Tabs (Loratadine) .[name]... Take 2 by mouth  daily    Astepro 0.15 % Soln (Azelastine hcl) .[name]..[name] Two sprays each nostril nightly  Orders: Est. 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  Medications Added to Medication List This Visit: 1)  Valtrex 1 Gm Tabs (Valacyclovir hcl) .... 1/2 tab two times a day 2)  Cosopt 2-0.5 % Soln (Dorzolamide-timolol) .[name].. 1 gtt ou two times a day in left eye 3)  Hair/skin/nails Tabs (Multiple vitamins-minerals) .... Every other  day 4)  Artificial Tears Soln (Artificial tear solution) .... As needed to right eye 5)  Astepro 0.15 % Soln (Azelastine hcl) .... Two sprays each nostril nightly  Complete Medication List: 1)  Bystolic 20 Mg Tabs (Nebivolol hcl) .[name].. 1 by mouth qd 2)  Norvasc 5 Mg Tabs (Amlodipine besylate) .... 1/2 by mouth daily 3)  Valtrex 1 Gm Tabs (Valacyclovir hcl) .... 1/2 tab two times a day 4)  Cosopt 2-0.5 % Soln (Dorzolamide-timolol) .[name].. 1 gtt ou two times a day in left eye 5)  K-vescent 25 Meq Tbef (Potassium bicarbonate) .... Take 1 tablet by mouth bid 6)  Claritin 10 Mg Tabs (Loratadine) .... Take 2 by mouth  daily 7)  Multivitamins Tabs (Multiple vitamin) .... Take 1 tablet by mouth once a day 8)  Zovirax 5 % Crea (Acyclovir) .... Use as needed as dirr 9)  Triamcinolone Acetonide 0.1 % Crea (Triamcinolone acetonide) .... Apply bid to affected area prn 10)  Fish Oil Oil (Fish oil) .... Two capsules once  daily 11)  Hair/skin/nails Tabs (Multiple vitamins-minerals) .... Every other day 12)  Cozaar 100 Mg Tabs (Losartan potassium) .[name].. 1 by mouth qd 13)  Neti Pot  .... As needed 14)  L-s-htp  .... Once daily 15)  Super Cal Meg Vitd 1000mg  K 500 Mg 800 Iu Vitd  .... One to two tab daily 16)  Lotrisone 1-0.05 % Crea (Clotrimazole-betamethasone) .... Use bid 17)  Artificial Tears Soln (Artificial tear solution) .... As needed to right eye 18)  Astepro 0.15 % Soln (Azelastine hcl) .... Two sprays each nostril nightly  Patient Instructions: 1)  Trial astepro two sprays each nostril nightly 2)  Claritin refill was sent 3)  No change in other medications 4)  Return 4 months Prescriptions: CLARITIN 10 MG TABS (LORATADINE) take 2 by mouth  daily  #60 x 6   Entered and Authorized by:   Betty Frisk MD   Signed by:   Betty Frisk MD on 04/25/2009   Method used:   Electronically to        [facility]  [address] (retail)       [address]       [place], [place]  [address]       Ph: [phone] or [phone]       Fax: [phone]   RxID:   [id number] ASTEPRO 0.15 % SOLN (AZELASTINE HCL) Two sprays each nostril nightly  #1 x 6   Entered and Authorized by:   Betty Frisk MD   Signed by:   Betty Frisk MD on 04/25/2009   Method used:   Print then Give to Patient   RxID:   [id number]

## 2010-02-15 NOTE — Assessment & Plan Note (Signed)
Summary: 3 MTH FU  STC   Vital Signs:  Patient profile:   50 year old female Weight:      148 pounds BMI:     29.01 Temp:     98.2 degrees F oral Pulse rate:   85 / minute BP sitting:   126 / 92  (left arm)  Vitals Entered By: Tora Perches (February 23, 2009 8:22 AM) CC: f/u Is Patient Diabetic? No   Primary Care Provider:  Toshiyuki Fredell  CC:  f/u.  History of Present Illness: The patient presents for a follow up of HTN,  back pain, anxiety, depression. Dad died in December 30, 2022. She has had a URI x few days   Preventive Screening-Counseling & Management  Alcohol-Tobacco     Smoking Status: never  Problems Prior to Update: 1)  Other Acute Sinusitis  (ICD-461.8) 2)  Hypokalemia  (ICD-276.8) 3)  Perimenopausal Syndrome  (ICD-627.9) 4)  Fatigue  (ICD-780.79) 5)  Tachycardia  (ICD-785.0) 6)  Allergic Rhinitis  (ICD-477.9) 7)  Asthma  (ICD-493.90) 8)  Hx of Sarcoidosis, Pulmonary  (ICD-135) 9)  Ganglion Cyst, Wrist, Left  (ICD-727.41) 10)  Palpitations  (ICD-785.1) 11)  Osteoarthritis  (ICD-715.90) 12)  Hand Pain  (ICD-729.5) 13)  Tinea Pedis  (ICD-110.4) 14)  Anxiety  (ICD-300.00) 15)  Osteoporosis  (ICD-733.00) 16)  Hypertension  (ICD-401.9) 17)  Depression  (ICD-311) 18)  Glaucoma Nos  (ICD-365.9) 19)  Other Symptoms Involving Digestive System Other  (EAV-409.81)  Current Medications (verified): 1)  Bystolic 20 Mg Tabs (Nebivolol Hcl) .[name].. 1 By Mouth Qd 2)  Norvasc 5 Mg  Tabs (Amlodipine Besylate) .... 1/2 By Mouth Daily 3)  Valtrex 500 Mg  Tabs (Valacyclovir Hcl) .... One By Mouth Once Daily 4)  Voltaren 1 %  Gel (Diclofenac Sodium) .... As Needed 5)  Cosopt 2-0.5 %  Soln (Dorzolamide-Timolol) .[name].. 1 Gtt Ou Two Times A Day 6)  K-Vescent 25 Meq  Tbef (Potassium Bicarbonate) .... Take 1 Tablet By Mouth Bid 7)  Claritin 10 Mg Tabs (Loratadine) .... Take 2 By Mouth  Daily 8)  Multivitamins   Tabs (Multiple Vitamin) .... Take 1 Tablet By Mouth Once A Day 9)  Zovirax 5 % Crea  (Acyclovir) .... Prn 10)  Triamcinolone Acetonide 0.1 % Crea (Triamcinolone Acetonide) .... Apply Bid To Affected Area Prn 11)  Neti Pot .... As Needed 12)  Fish Oil  Oil (Fish Oil) .... Two Capsules Once Daily 13)  Hair/skin/nails  Tabs (Multiple Vitamins-Minerals) .... Once Daily 14)  L-S-Htp .... Once Daily 15)  Super Cal Meg Vitd 1000mg  K 500 Mg 800 Iu Vitd .... One To Two Tab Daily 16)  Cozaar 100 Mg Tabs (Losartan Potassium) .[name].. 1 By Mouth Qd 17)  Fluticasone Propionate 50 Mcg/act Susp (Fluticasone Propionate) .... Two Sprays Each Nostril Daily  Allergies: 1)  ! Sulfa 2)  ! * Flu Shots 3)  ! 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 Exercise-up to  4 times week Caffeine-1-2 per week  Family History: Family History Hypertension CVA, CAD Mother died 51yo- lung cancer, sarcoid  Mother- deceased age 18; sarcoidosis; lung cancer Father- deceased age 1; stroke, Hypertension, heart problems Sibling 1- living age 11; Hypertension;smoker Sibling 2-living age 46;anemic  Review of Systems       The patient complains of anorexia and depression.  The patient denies fever.    Physical Exam  General:  NAD, less  tired Nose:  External nasal examination shows no  deformity or inflammation. Nasal mucosa are pink and moist without lesions or exudates. Mouth:  Oral mucosa and oropharynx without lesions or exudates.  Teeth in good repair. Neck:  No deformities, masses, or tenderness noted. Lungs:  CTA Heart:  Normal rate and regular rhythm. S1 and S2 normal without gallop, murmur, click, rub or other extra sounds. Slight tachy Abdomen:  Bowel sounds positive,abdomen soft and non-tender without masses, organomegaly or hernias noted. Msk:  No deformity or scoliosis noted of thoracic or lumbar spine.  L lat ankle is a little puffy Neurologic:  No cranial nerve deficits noted. Station and gait are normal. Plantar reflexes are down-going bilaterally. DTRs are symmetrical throughout. Sensory, motor and coordinative functions appear intact. Skin:  Intact without suspicious lesions or rashes Psych:  Cognition and judgment appear intact. Alert and cooperative with normal attention span and concentration. No apparent delusions, illusions, hallucinations   Impression & Recommendations:  Problem # 1:  HYPERTENSION (ICD-401.9) Assessment Unchanged  Her updated medication list for this problem includes:    Bystolic 20 Mg Tabs (Nebivolol hcl) .[name]... 1 by mouth qd    Norvasc 5 Mg Tabs (Amlodipine besylate) .[name]... 1/2 by mouth daily    Cozaar 100 Mg Tabs (Losartan potassium) .[name]... 1 by mouth qd  Problem # 2:  ASTHMA (ICD-493.90) Assessment: Unchanged On prescription therapy   Problem # 3:  GRIEF REACTION (ICD-309.0) Assessment: New Discussed  Problem # 4:  ANXIETY (ICD-300.00) Assessment: Unchanged  Problem # 5:  SINUSITIS, ACUTE (ICD-461.9) Assessment: New Finishing Zpac Her updated medication list for this problem includes:    Fluticasone Propionate 50 Mcg/act Susp (Fluticasone propionate) .[name]..[name] Two sprays each nostril daily  Complete Medication List: 1)  Bystolic 20 Mg Tabs (Nebivolol hcl) .[name].. 1 by mouth qd 2)  Norvasc 5 Mg Tabs (Amlodipine besylate)  .... 1/2 by mouth daily 3)  Valtrex 500 Mg Tabs (Valacyclovir hcl) .... One by mouth once daily 4)  Voltaren 1 % Gel (Diclofenac sodium) .... As needed 5)  Cosopt 2-0.5 % Soln (Dorzolamide-timolol) .[name].. 1 gtt ou two times a day 6)  K-vescent 25 Meq Tbef (Potassium bicarbonate) .... Take 1 tablet by mouth bid 7)  Claritin 10 Mg Tabs (Loratadine) .... Take 2 by mouth  daily 8)  Multivitamins Tabs (Multiple vitamin) .... Take 1 tablet by mouth once a day 9)  Zovirax 5 % Crea (Acyclovir) .... Prn 10)  Triamcinolone Acetonide 0.1 % Crea (Triamcinolone acetonide) .... Apply bid to affected area prn 11)  Neti Pot  .... As needed 12)  Fish Oil Oil (Fish oil) .... Two capsules once daily 13)  Hair/skin/nails Tabs (Multiple vitamins-minerals) .... Once daily 14)  L-s-htp  .... Once daily 15)  Super Cal Meg Vitd 1000mg  K 500 Mg 800 Iu Vitd  .... One to two tab daily 16)  Cozaar 100 Mg Tabs (Losartan potassium) .[name].. 1 by mouth qd 17)  Fluticasone Propionate 50 Mcg/act Susp (Fluticasone propionate) .... Two sprays each nostril daily  Patient  Instructions: 1)  Please schedule a follow-up appointment in 2 months.

## 2010-02-15 NOTE — Assessment & Plan Note (Signed)
Summary: 2 MO ROV /NWS  #  MAY COME FASTING   Vital Signs:  Patient profile:   50 year old female Height:      60 inches Weight:      144.50 pounds BMI:     28.32 O2 Sat:      95 % on Room air Temp:     97.0 degrees F oral Pulse rate:   75 / minute BP sitting:   130 / 88  (left arm) Cuff size:   regular  Vitals Entered By: Lucious Groves (April 18, 2009 8:09 AM)  O2 Flow:  Room air CC: F/U--c/o fatigue and not sleeping well./kb Is Patient Diabetic? No Pain Assessment Patient in pain? no      Comments Patient states needs refills of Triamcinolone and Zovirax./kb    Primary Care Provider:  Gala Padovano  CC:  F/U--c/o fatigue and not sleeping well./kb.  History of Present Illness: The patient presents for a follow up of hypertension, depression, hyperlipidemia. C/o fatigue at times. BP was 138/70   Current Medications (verified): 1)  Bystolic 20 Mg Tabs (Nebivolol Hcl) .[name].. 1 By Mouth Qd 2)  Norvasc 5 Mg  Tabs (Amlodipine Besylate) .... 1/2 By Mouth Daily 3)  Valtrex 500 Mg  Tabs (Valacyclovir Hcl) .... One By Mouth Once Daily 4)  Voltaren 1 %  Gel (Diclofenac Sodium) .... As Needed 5)  Cosopt 2-0.5 %  Soln (Dorzolamide-Timolol) .[name].. 1 Gtt Ou Two Times A Day 6)  K-Vescent 25 Meq  Tbef (Potassium Bicarbonate) .... Take 1 Tablet By Mouth Bid 7)  Claritin 10 Mg Tabs (Loratadine) .... Take 2 By Mouth  Daily 8)  Multivitamins   Tabs (Multiple Vitamin) .... Take 1 Tablet By Mouth Once A Day 9)  Zovirax 5 % Crea (Acyclovir) .... Prn 10)  Triamcinolone Acetonide 0.1 % Crea (Triamcinolone Acetonide) .... Apply Bid To Affected Area Prn 11)  Neti Pot .... As Needed 12)  Fish Oil  Oil (Fish Oil) .... Two Capsules Once Daily 13)  Hair/skin/nails  Tabs (Multiple Vitamins-Minerals) .... Once Daily 14)  L-S-Htp .... Once Daily 15)  Super Cal Meg Vitd 1000mg  K 500 Mg 800 Iu Vitd .... One To Two Tab Daily 16)  Cozaar 100 Mg Tabs (Losartan Potassium) .[name].. 1 By Mouth Qd 17)  Fluticasone  Propionate 50 Mcg/act Susp (Fluticasone Propionate) .... Two Sprays Each Nostril Daily 18)  Tamiflu 75 Mg Caps (Oseltamivir Phosphate) .... Two Times A Day X 5 Days  Allergies (verified): 1)  ! Sulfa 2)  ! * Flu Shots 3)  ! Augmentin 4)  Cephalexin (Cephalexin) 5)  * Omnicef 6)  Hydrochlorothiazide (Hydrochlorothiazide) 7)  Coreg (Carvedilol) 8)  Sulfamethoxazole (Sulfamethoxazole)  Review of Systems       The patient complains of weight loss.  The patient denies chest pain, dyspnea on exertion, and melena.    Physical Exam  General:  NAD, less  tired Nose:  External nasal examination shows no deformity or inflammation. Nasal mucosa are pink and moist without lesions or exudates. Mouth:  Oral mucosa and oropharynx without lesions or exudates.  Teeth in good repair. Lungs:  CTA Heart:  Normal rate and regular rhythm. S1 and S2 normal without gallop, murmur, click, rub or other extra sounds. Slight tachy Abdomen:  Bowel sounds positive,abdomen soft and non-tender without masses, organomegaly or hernias noted. Msk:  No deformity or scoliosis noted of thoracic or lumbar spine.  L lat ankle is a little puffy Extremities:  No clubbing, cyanosis,  edema, or deformity noted with normal full range of motion of all joints.   Neurologic:  No cranial nerve deficits noted. Station and gait are normal. Plantar reflexes are down-going bilaterally. DTRs are symmetrical throughout. Sensory, motor and coordinative functions appear intact. Skin:  Intact without suspicious lesions or rashes Psych:  Cognition and judgment appear intact. Alert and cooperative with normal attention span and concentration. No apparent delusions, illusions, hallucinations   Impression & Recommendations:  Problem # 1:  FATIGUE (ICD-780.79) Assessment Unchanged Sleep more  Problem # 2:  ASTHMA (ICD-493.90) Assessment: Unchanged  Problem # 3:  HYPERTENSION (ICD-401.9) Assessment: Unchanged  Her updated medication list  for this problem includes:    Bystolic 20 Mg Tabs (Nebivolol hcl) .[name]... 1 by mouth qd    Norvasc 5 Mg Tabs (Amlodipine besylate) .[name]... 1/2 by mouth daily    Cozaar 100 Mg Tabs (Losartan potassium) .[name]... 1 by mouth qd  BP today: 130/88 Prior BP: 126/92 (02/23/2009)  Labs Reviewed: K+: 3.9 (02/20/2009) Creat: : 1.0 (02/20/2009)   Chol: 139 (08/05/2007)   HDL: 34.4 (08/05/2007)   LDL: 83 (08/05/2007)   TG: 109 (08/05/2007)  Problem # 4:  OSTEOPOROSIS (ICD-733.00) Assessment: Unchanged On prescription/OTC  therapy   Problem # 5:  DEPRESSION (ICD-311) Assessment: Unchanged  Problem # 6:  HYPOKALEMIA (ICD-276.8) Assessment: Comment Only  Orders: TLB-BMP (Basic Metabolic Panel-BMET) (80048-METABOL)  Complete Medication List: 1)  Bystolic 20 Mg Tabs (Nebivolol hcl) .[name].. 1 by mouth qd 2)  Norvasc 5 Mg Tabs (Amlodipine besylate) .... 1/2 by mouth daily 3)  Valtrex 500 Mg Tabs (Valacyclovir hcl) .... One by mouth once daily 4)  Voltaren 1 % Gel (Diclofenac sodium) .... As needed 5)  Cosopt 2-0.5 % Soln (Dorzolamide-timolol) .[name].. 1 gtt ou two times a day 6)  K-vescent 25 Meq Tbef (Potassium bicarbonate) .... Take 1 tablet by mouth bid 7)  Claritin 10 Mg Tabs (Loratadine) .... Take 2 by mouth  daily 8)  Multivitamins Tabs (Multiple vitamin) .... Take 1 tablet by mouth once a day 9)  Zovirax 5 % Crea (Acyclovir) .... Use as needed as dirr 10)  Triamcinolone Acetonide 0.1 % Crea (Triamcinolone acetonide) .... Apply bid to affected area prn 11)  Fish Oil Oil (Fish oil) .... Two capsules once daily 12)  Hair/skin/nails Tabs (Multiple vitamins-minerals) .... Once daily 13)  Cozaar 100 Mg Tabs (Losartan potassium) .[name].. 1 by mouth qd 14)  Fluticasone Propionate 50 Mcg/act Susp (Fluticasone propionate) .... Two sprays each nostril daily 15)  Neti Pot  .... As needed 16)  L-s-htp  .... Once daily 17)  Super Cal Meg Vitd 1000mg  K 500 Mg 800 Iu Vitd  .... One to two tab daily 18)  Lotrisone 1-0.05  % Crea (Clotrimazole-betamethasone) .... Use bid  Patient Instructions: 1)  Please schedule a follow-up appointment in 3 months. 2)  BMP prior to visit, ICD-9: 401.1 Prescriptions: ZOVIRAX 5 % CREA (ACYCLOVIR) use as needed as dirr  #1 x 2   Entered and Authorized by:   Tresa Garter MD   Signed by:   Tresa Garter MD on 04/18/2009   Method used:   Electronically to        [facility] (retail)       [address]       [place], [place]  [id number]       Ph: [phone]       Fax: [phone]   RxID:   [id number] LOTRISONE 1-0.05 %  CREA (CLOTRIMAZOLE-BETAMETHASONE) use bid  #90 g x 1   Entered and Authorized by:   Tresa Garter MD   Signed by:   Tresa Garter MD on 04/18/2009   Method used:   Electronically to        [facility] [address] (retail)       [address]       [place], [place]  [id number]       Ph: [phone]       Fax: [phone]   RxID:   [id number]   Appended Document: 2 MO ROV Natale Milch  #  MAY COME FASTING     Clinical Lists Changes  Orders: Added new Test order of TLB-TSH (Thyroid Stimulating Hormone) (84443-TSH) - Signed Added new Test order of TLB-Hepatic/Liver Function Pnl (80076-HEPATIC) - Signed Added new Test order of TLB-Hemoglobin (Hgb) (85018-HGB) - Signed

## 2010-02-15 NOTE — Assessment & Plan Note (Signed)
Summary: Pulmonary OV   Primary [name]/Referring Betty Platas:  Moore  CC:  4 month follow up.  Pt states breathing is unchange from last OV.  Denies wheezing, chest tightness, and cough.  .  History of Present Illness: Pulmonary OV: [name] is a  50 year-old [place] American  female with a history of sarcoidosis.    January 11, 2009 4:49 PM Pt does ok except at night gets thick mucous in sinuses.   There is some pressure in sinuses. The pt  hurts across the both frontals and r maxillary sinus Pt had depo injection at [facility] two days ago plus mucinex  April 25, 2009 1:33 PM F/u sarcoid.  Dyspnea only with exertion fast or upstairs.  Sinus pressure is less.  Has sneezing and pndrip esp if outdoors.  Needs clariten rx.   This helps the rhinitis.  No real cough and if does is dry.  No f/c/s. Has occ hot flashes.   07/03/09 Acute OV  [name]: the pt is a 50 y/o female who comes in today for an acute sick visit.  She has known sarcoidosis that has been fairly stable, but also has recurrent issues with sinusitis.  She began to have sinus issues about 2-3 weeks ago, and was treated at Urgent care with a zpak.  Despite taking this, she has continued to have nasal congestion, postnasal drip, and cough with purulent material.  She also has some chest congestion, as well as doe above her usual baseline.  She denies any f/c/s.  September 04, 2009 11:39 AM Pt seen 6/20 for acute ov and rx levaquin and pulse pred with sinus rinse. Now is better.  Less cough .  Has stress at work.  Wants to exercise more .   Preventive Screening-Counseling & Management  Alcohol-Tobacco     Smoking Status: never     Passive Smoke Exposure: yes  Current Medications (verified): 1)  Bystolic 20 Mg Tabs (Nebivolol Hcl) .[name].. 1 By Mouth Qd 2)  Norvasc 5 Mg  Tabs (Amlodipine Besylate) .... 1/2 By Mouth Daily 3)  Valtrex 1 Gm Tabs (Valacyclovir Hcl) .... 1/2 Tab Two Times A Day 4)  Cosopt 2-0.5 %  Soln (Dorzolamide-Timolol)  .[name].. 1 Gtt Ou Two Times A Day in Left Eye 5)  K-Vescent 25 Meq  Tbef (Potassium Bicarbonate) .... Take 1 Tablet By Mouth Bid 6)  Claritin 10 Mg Tabs (Loratadine) .... Take 2 By Mouth  Daily 7)  Multivitamins   Tabs (Multiple Vitamin) .... Take 1 Tablet By Mouth Once A Day 8)  Zovirax 5 % Crea (Acyclovir) .... Use As Needed As Dirr 9)  Triamcinolone Acetonide 0.1 % Crea (Triamcinolone Acetonide) .... Apply Bid To Affected Area Prn 10)  Fish Oil  Oil (Fish Oil) .... Two Capsules Once Daily 11)  Hair/skin/nails  Tabs (Multiple Vitamins-Minerals) .... Take 1 Tablet By Mouth Once A Day 12)  Cozaar 100 Mg Tabs (Losartan Potassium) .[name].. 1 By Mouth Qd 13)  Artificial Tears  Soln (Artificial Tear Solution) .... As Needed To Both Eyes 14)  Neti Pot .... Once Daily 15)  L-S-Htp .... Once Daily 16)  Super Cal Meg Vitd 1000mg  K 500 Mg 800 Iu Vitd .... One To Two Tab Daily 17)  Fluticasone Propionate 50 Mcg/act Susp (Fluticasone Propionate) .... As Needed At Bedtime  Allergies (verified): 1)  ! Sulfa 2)  ! * Flu Shots 3)  ! Augmentin 4)  Cephalexin (Cephalexin) 5)  * Omnicef 6)  Hydrochlorothiazide (Hydrochlorothiazide) 7)  Coreg (  Carvedilol) 8)  Sulfamethoxazole (Sulfamethoxazole)  Clinical Reports Reviewed:  CXR:  08/31/2008: CXR Results:  Findings: Mild bilateral hilar and right paratracheal lymphadenopathy remains stable.   Mild bilateral upper lobe interstitial infiltrates are again seen, without significant change.  No new or worsening areas of pulmonary infiltrate are seen.  There is no evidence of pleural effusion.   IMPRESSION: Stable bilateral hilar and right paratracheal adenopathy, and chronic bilateral upper lobe interstitial infiltrates, all consistent with known diagnosis of sarcoidosis.  No acute findings.    PFT's:  09/16/2008: DLCO %Predicted:  86 FEF 25/75 %Predicted:  62 FEV1 %Predicted:  103 FEV1/FVC %Predicted:  2.68 FVC %Predicted:  107 Post Spirometry FEF  25/75 %Predicted:  68 Post Spirometry FEV1 %Predicted:  101 Post Spirometry FVC %Predicted:  100 RV %Predicted:  109 TLC %Predicted:  111   Past History:  Past medical, surgical, family and social histories (including risk factors) reviewed, and no changes noted (except as noted below).  Past Medical History: Reviewed history from 07/20/2009 and no changes required. 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 [facility] Allergic rhinitis Skin test 08/19/07 Osteoarthritis GYN Dr Henderson Cloud  Past Surgical History: Reviewed history from 05/03/2008 and no changes required. Bunions x 2 Fibroid tumor Dental surgery D+C  R eye glaucoma surgery 2010 Dr Harlon Flor  Family History: Reviewed history from 02/23/2009 and no changes required. Family History Hypertension CVA, CAD Mother died 61yo- lung cancer, sarcoid  Mother- deceased age 25; sarcoidosis; lung cancer Father- deceased age 36; stroke, Hypertension, heart problems Sibling 1- living age 71; Hypertension;smoker Sibling 2-living age 51;anemic  Social History: Reviewed history from 07/20/2009 and no changes required. `Occupation: SW Single/ divorced;no children; 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 Exercise-up to 4 times week Caffeine-1-2 per week  Review of Systems       The patient complains of shortness of breath with activity.  The patient denies shortness of breath at rest, productive cough, non-productive cough, coughing up blood, chest pain, irregular heartbeats, acid heartburn, indigestion, loss of appetite, weight change, abdominal pain, difficulty swallowing, sore throat, tooth/dental problems, headaches, nasal congestion/difficulty breathing through nose, sneezing, itching, ear ache, anxiety, depression, hand/feet  swelling, joint stiffness or pain, rash, change in color of mucus, and fever.    Vital Signs:  Patient profile:   50 year old female Height:      60 inches Weight:      149.13 pounds BMI:     29.23 O2 Sat:      98 % on Room air Temp:     98.2 degrees F oral Pulse rate:   71 / minute BP sitting:   132 / 84  (right arm) Cuff size:   regular  Vitals Entered By: Gweneth Dimitri RN (September 04, 2009 11:30 AM)  O2 Flow:  Room air CC: 4 month follow up.  Pt states breathing is unchange from last OV.  Denies wheezing, chest tightness, cough.   Comments Medications reviewed with patient Daytime contact number verified with patient. Gweneth Dimitri RN  September 04, 2009 11:31 AM    Physical Exam  Additional Exam:  Gen: Pleasant, well nourished, in no distress ENT: severe turbinate edema, nares not  purulent  , no postnasal drip Neck: No JVD, no TMG, no carotid bruits Lungs: no use of accessory muscles, no dullness to percussion, clear without rales or rhonchi Cardiovascular: RRR,  heart sounds normal, no murmurs or gallops, no peripheral edema Musculoskeletal: No deformities, no cyanosis or clubbing     CXR  Procedure date:  09/04/2009  Findings:      IMPRESSION: Findings compatible with sarcoidosis as above.  Scar in the right upper lobe shows some progression since the prior study.    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) Assessment Unchanged stable sarcoidosis plan f/u cxr>>>stable f/u pfts   Problem # 2:  SINUSITIS, ACUTE (ICD-461.9) Assessment: Improved recent sinusitis improved plan cont inhaled meds and sinus rinse The following medications were removed from the medication list:    Astepro 0.15 % Soln (Azelastine hcl) .[name]..[name] Two sprays each nostril nightly Her updated medication list for this problem includes:    Fluticasone Propionate 50 Mcg/act Susp (Fluticasone propionate) .[name]..[name] Two puffs each nostril   daily  Medications Added to Medication List This Visit: 1)  Artificial Tears Soln (Artificial tear solution) .... As needed to both eyes 2)  Neti Pot  .... Once daily 3)  Fluticasone Propionate 50 Mcg/act Susp (Fluticasone propionate) .... As needed at bedtime 4)  Fluticasone Propionate 50 Mcg/act Susp (Fluticasone propionate) .... Two puffs each nostril  daily  Complete Medication List: 1)  Bystolic 20 Mg Tabs (Nebivolol hcl) .[name].. 1 by mouth qd 2)  Norvasc 5 Mg Tabs (Amlodipine besylate) .... 1/2 by mouth daily 3)  Valtrex 1 Gm Tabs (Valacyclovir hcl) .... 1/2 tab two times a day 4)  Cosopt 2-0.5 % Soln (Dorzolamide-timolol) .[name].. 1 gtt ou two times a day in left eye 5)  K-vescent 25 Meq Tbef (Potassium bicarbonate) .... Take 1 tablet by mouth bid 6)  Claritin 10 Mg Tabs (Loratadine) .... Take 2 by mouth  daily 7)  Multivitamins Tabs (Multiple vitamin) .... Take 1 tablet by mouth once a day 8)  Zovirax 5 % Crea (Acyclovir) .... Use as needed as dirr 9)  Triamcinolone Acetonide 0.1 % Crea (Triamcinolone acetonide) .... Apply bid to affected area prn 10)  Fish Oil Oil (Fish oil) .... Two capsules once daily 11)  Hair/skin/nails Tabs (Multiple vitamins-minerals) .... Take 1 tablet by mouth once a day 12)  Cozaar 100 Mg Tabs (Losartan potassium) .[name].. 1 by mouth qd 13)  Artificial Tears Soln (Artificial tear solution) .... As needed to both eyes 14)  Neti Pot  .... Once daily 15)  L-s-htp  .... Once daily 16)  Super Cal Meg Vitd 1000mg  K 500 Mg 800 Iu Vitd  .... One to two tab daily 17)  Fluticasone Propionate 50 Mcg/act Susp (Fluticasone propionate) .... Two puffs each nostril  daily  Other Orders: Est. Patient Level III (32440) T-2 View CXR (71020TC) Pulmonary Referral (Pulmonary)  Patient Instructions: 1)  A chest xray will be obtained today 2)  A set of pulmonary funciton studies will be obtained 3)  Stay on fluticasone two sprays each nostril daily for two weeks then as needed 4)   Return 4 months, I will call with XRAY and lung function test results Prescriptions: FLUTICASONE PROPIONATE 50 MCG/ACT SUSP (FLUTICASONE PROPIONATE) Two puffs each nostril  daily  #1 x 6   Entered and Authorized by:   Storm Frisk MD   Signed by:   Storm Frisk MD on 09/04/2009   Method used:   Print then Give to Patient   RxID:   [id number]   Prevention & Chronic Care Immunizations   Influenza vaccine: Not documented    Tetanus booster: 04/01/1994:  given    Pneumococcal vaccine: Not documented  Colorectal Screening   Hemoccult: Not documented    Colonoscopy: Not documented  Other Screening   Pap smear: normal  (07/07/2006)    Mammogram: normal  (07/07/2006)   Smoking status: never  (09/04/2009)  Lipids   Total Cholesterol: 139  (08/05/2007)   LDL: 83  (08/05/2007)   LDL Direct: Not documented   HDL: 34.4  (08/05/2007)   Triglycerides: 109  (08/05/2007)  Hypertension   Last Blood Pressure: 132 / 84  (09/04/2009)   Serum creatinine: 0.9  (07/18/2009)   Serum potassium 3.8  (07/18/2009)  Self-Management Support :    Hypertension self-management support: Not documented

## 2010-02-15 NOTE — Assessment & Plan Note (Signed)
Summary: 3 MO ROV /NWS #   Vital Signs:  Patient profile:   50 year old female Height:      60 inches (152.40 cm) Weight:      148 pounds (67.27 kg) BMI:     29.01 O2 Sat:      95 % on Room air Temp:     98.6 degrees F (37.00 degrees C) oral Pulse rate:   78 / minute Pulse rhythm:   regular Resp:     16 per minute BP sitting:   130 / 88  (left arm) Cuff size:   regular  Vitals Entered By: Lanier Prude, CMA(AAMA) (July 20, 2009 8:04 AM)  O2 Flow:  Room air CC: 3 mo f/u. c/o frequent nocturia Is Patient Diabetic? No Comments pt is not taking Levaquin or Prednisone.  Please remove from list   Primary Care Provider:  Dmiya Malphrus  CC:  3 mo f/u. c/o frequent nocturia.  History of Present Illness: The patient presents for a follow up of hypertension, sarcoid, bronchitis. Dr Shelle Iron treated for bronchitis. She saw her GYN and had labs incl HIV, LFTs   Current Medications (verified): 1)  Bystolic 20 Mg Tabs (Nebivolol Hcl) .[name].. 1 By Mouth Qd 2)  Norvasc 5 Mg  Tabs (Amlodipine Besylate) .... 1/2 By Mouth Daily 3)  Valtrex 1 Gm Tabs (Valacyclovir Hcl) .... 1/2 Tab Two Times A Day 4)  Cosopt 2-0.5 %  Soln (Dorzolamide-Timolol) .[name].. 1 Gtt Ou Two Times A Day in Left Eye 5)  K-Vescent 25 Meq  Tbef (Potassium Bicarbonate) .... Take 1 Tablet By Mouth Bid 6)  Claritin 10 Mg Tabs (Loratadine) .... Take 2 By Mouth  Daily 7)  Multivitamins   Tabs (Multiple Vitamin) .... Take 1 Tablet By Mouth Once A Day 8)  Zovirax 5 % Crea (Acyclovir) .... Use As Needed As Dirr 9)  Triamcinolone Acetonide 0.1 % Crea (Triamcinolone Acetonide) .... Apply Bid To Affected Area Prn 10)  Fish Oil  Oil (Fish Oil) .... Two Capsules Once Daily 11)  Hair/skin/nails  Tabs (Multiple Vitamins-Minerals) .... Take 1 Tablet By Mouth Once A Day 12)  Cozaar 100 Mg Tabs (Losartan Potassium) .[name].. 1 By Mouth Qd 13)  Neti Pot .... As Needed 14)  L-S-Htp .... Once Daily 15)  Super Cal Meg Vitd 1000mg  K 500 Mg 800 Iu Vitd ....  One To Two Tab Daily 16)  Artificial Tears  Soln (Artificial Tear Solution) .... As Needed To Right Eye 17)  Astepro 0.15 % Soln (Azelastine Hcl) .... Two Sprays Each Nostril Nightly 18)  Levaquin 750 Mg  Tabs (Levofloxacin) .... One Tablet By Mouth Daily 19)  Prednisone 10 Mg  Tabs (Prednisone) .... Take 4 Each Day For 2 Days, Then 3 Each Day For 2 Days, Then 2 Each Day For 2 Days, Then 1 Each Day For 2 Days, Then Stop  Allergies (verified): 1)  ! Sulfa 2)  ! * Flu Shots 3)  ! 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 Exercise-up to 4 times week Caffeine-1-2 per week  Physical Exam  General:  NAD, less  tired Ears:  External ear exam shows no significant lesions or deformities.  Otoscopic examination reveals clear canals, tympanic membranes are intact bilaterally without bulging, retraction, inflammation or discharge. Hearing is grossly normal bilaterally. Nose:  External nasal examination shows no deformity or inflammation. Nasal mucosa are pink and moist without lesions or exudates. Mouth:  Oral mucosa and oropharynx without lesions or exudates.  Teeth in good repair. Neck:  No deformities, masses, or tenderness noted. Lungs:  CTA Heart:  Normal rate and regular  rhythm. S1 and S2 normal without gallop, murmur, click, rub or other extra sounds. Slight tachy Abdomen:  Bowel sounds positive,abdomen soft and non-tender without masses, organomegaly or hernias noted. Msk:  No deformity or scoliosis noted of thoracic or lumbar spine.  L lat ankle is a little puffy Extremities:  No clubbing, cyanosis, edema, or deformity noted with normal full range of motion of all joints.   Neurologic:  No cranial nerve deficits noted. Station and gait are normal. Plantar reflexes are down-going bilaterally. DTRs are symmetrical throughout. Sensory, motor and coordinative functions appear intact. Skin:  Intact without suspicious lesions or rashes   Impression & Recommendations:  Problem # 1:  ASTHMA (ICD-493.90) Assessment Improved  The following medications were removed from the medication list:    Prednisone 10 Mg Tabs (Prednisone) .[name]... Take 4 each day for 2 days, then 3 each day for 2 days, then 2 each day for 2 days, then 1 each day for 2 days, then stop  Problem # 2:  DEPRESSION (ICD-311) Assessment: Improved  Problem # 3:  HYPERTENSION (ICD-401.9) Assessment: Improved  Her updated medication list for this problem includes:    Bystolic 20 Mg Tabs (Nebivolol hcl) .[name]... 1 by mouth qd    Norvasc 5 Mg Tabs (Amlodipine besylate) .[name]... 1/2 by mouth daily    Cozaar 100 Mg Tabs (Losartan potassium) .[name]... 1 by mouth qd  BP today: 130/88 Prior BP: 128/78 (07/03/2009)  Labs Reviewed: K+: 3.8 (07/18/2009) Creat: : 0.9 (07/18/2009)   Chol: 139 (08/05/2007)   HDL: 34.4 (08/05/2007)   LDL: 83 (08/05/2007)   TG: 109 (08/05/2007)  Problem # 4:  OSTEOPOROSIS (ICD-733.00) Assessment: Unchanged  Problem # 5:  HYPOKALEMIA (ICD-276.8) Assessment: Improved The labs were reviewed with the patient.   Problem # 6:  PALPITATIONS (ICD-785.1) Assessment: Improved  Her updated medication list for this problem includes:    Bystolic 20 Mg Tabs (Nebivolol hcl) .[name]... 1 by mouth  qd  Complete Medication List: 1)  Bystolic 20 Mg Tabs (Nebivolol hcl) .[name].. 1 by mouth qd 2)  Norvasc 5 Mg Tabs (Amlodipine besylate) .... 1/2 by mouth daily 3)  Valtrex 1 Gm Tabs (Valacyclovir hcl) .... 1/2 tab two times a day 4)  Cosopt 2-0.5 % Soln (Dorzolamide-timolol) .[name].. 1 gtt ou two times a day in left eye 5)  K-vescent 25 Meq Tbef (Potassium bicarbonate) .... Take 1 tablet by mouth bid 6)  Claritin 10 Mg Tabs (Loratadine) .... Take 2 by mouth  daily 7)  Multivitamins Tabs (Multiple vitamin) .... Take 1 tablet by mouth once a day 8)  Zovirax 5 % Crea (Acyclovir) .... Use as needed as dirr 9)  Triamcinolone Acetonide 0.1 % Crea (Triamcinolone acetonide) .... Apply bid to affected area prn 10)  Fish Oil Oil (Fish oil) .... Two capsules  once daily 11)  Hair/skin/nails Tabs (Multiple vitamins-minerals) .... Take 1 tablet by mouth once a day 12)  Cozaar 100 Mg Tabs (Losartan potassium) .[name].. 1 by mouth qd 13)  Artificial Tears Soln (Artificial tear solution) .... As needed to right eye 14)  Astepro 0.15 % Soln (Azelastine hcl) .... Two sprays each nostril nightly 15)  Neti Pot  .... As needed 16)  L-s-htp  .... Once daily 17)  Super Cal Meg Vitd 1000mg  K 500 Mg 800 Iu Vitd  .... One to two tab daily  Patient Instructions: 1)  Please schedule a follow-up appointment in 3 months.

## 2010-02-15 NOTE — Progress Notes (Signed)
Summary: call after 2:00pm  Phone Note Call from Patient Call back at Home Phone [phone]   Caller: Patient Call For: wright Reason for Call: Talk to Nurse Summary of Call: still taking all meds, given by [facility].  Sinus area plegm is thick but clear.  Plegm she is coughing up is green.  Need to know what to do re: thick ness in her sinus mucous.  Please advise. Initial call taken by: Eugene Gavia,  July 06, 2009 12:08 PM  Follow-up for Phone Call        Pt was seen on 6/20 by [facility] and given Levaquin and Prednisone.   Called, spoke with pt.  Pt states cough is getting better but still having a prod cough with green mucus.  States early this am she coughed up some blood tinged mucus x1.  Pt also states sinus issues are getting better.  States mucus is now clear but is "thicker."  Pt has 2 questions.  1.  What she could do for the thick sinus mucus and 2.  Currently on K-vescent two times a day, ? if she should stop that while on prednisone and abx.  Has heard not to take a mineral with these meds.  Informed pt PW is out of the office until tomorrow-pt requesting this to be addressed today by [facility] in the absence of PW.  WIll forward message to [facility]-pls advise.  Thanks! Follow-up by: Gweneth Dimitri RN,  July 06, 2009 3:33 PM  Additional Follow-up for Phone Call Additional follow up Details #1::        1) can take mucinex 2 tabs am and pm until better.  Needs to do sinus rinses am and pm as prescribed. 2)no issues with the mentioned meds and potassium supplements. Additional Follow-up by: Barbaraann Share MD,  July 06, 2009 5:19 PM    Additional Follow-up for Phone Call Additional follow up Details #2::    Spoke with pt.  pt informed of above recs and statement per [facility].  Also informed pt if sxs do not improve or get worse to call office back.  She verbalized understanding.  Will forward message to PW as FYI.  Gweneth Dimitri RN  July 06, 2009 5:23 PM   Additional Follow-up for Phone Call Additional  follow up Details #3:: Details for Additional Follow-up Action Taken: noted Additional Follow-up by: Storm Frisk MD,  July 07, 2009 9:05 AM

## 2010-02-15 NOTE — Letter (Signed)
Summary: Out of Work  [facility]  [address] [place], [place] [address]   Phone: [phone]  Fax: [phone]    February 08, 2010   Employee:  JOLISSA KAPRAL    To Whom It May Concern:   For Medical reasons, please excuse the above named employee from work for the following dates:  Start:   02/08/10  End:   02/12/10  If you need additional information, please feel free to contact our office.         Sincerely,    Etta Grandchild MD

## 2010-02-15 NOTE — Assessment & Plan Note (Signed)
Summary: DR AVP PT/NO SLOT BP:  155/98---STC   Vital Signs:  Patient profile:   50 year old female Menstrual status:  postmenopausal Height:      60 inches Weight:      146 pounds BMI:     28.62 O2 Sat:      97 % on Room air Temp:     97.6 degrees F oral Pulse rate:   68 / minute Pulse rhythm:   regular Resp:     16 per minute BP supine:   150 / 98  (right arm) BP sitting:   146 / 98  (left arm) Cuff size:   regular  Vitals Entered By: Rock Nephew CMA (February 08, 2010 9:49 AM)  Nutrition Counseling: Patient's BMI is greater than 25 and therefore counseled on weight management options.  O2 Flow:  Room air     Menstrual Status postmenopausal Last PAP Result normal   Primary Care Provider:  Plotnikov   History of Present Illness:  Hypertension Follow-Up      This is a 50 year old woman who presents for Hypertension follow-up.  The patient reports fatigue, but denies lightheadedness, urinary frequency, headaches, edema, and rash.  The patient denies the following associated symptoms: chest pain, chest pressure, exercise intolerance, dyspnea, palpitations, syncope, leg edema, and pedal edema.  Compliance with medications (by patient report) has been near 100%.  The patient reports that dietary compliance has been poor.  The patient reports no exercise.  Adjunctive measures currently used by the patient include relaxation.    Preventive Screening-Counseling & Management  Alcohol-Tobacco     Alcohol drinks/day: 0     Alcohol Counseling: not indicated; patient does not drink     Smoking Status: never     Tobacco Counseling: not indicated; no tobacco use  Hep-HIV-STD-Contraception     Hepatitis Risk: no risk noted     HIV Risk: no risk noted     STD Risk: no risk noted  Clinical Review Panels:  Prevention   Last Mammogram:  normal (07/07/2006)   Last Pap Smear:  normal (07/07/2006)  Immunizations   Last Tetanus Booster:  given (04/01/1994)  Lipid Management  Cholesterol:  158 (01/31/2010)   LDL (bad choesterol):  102 (01/31/2010)   HDL (good cholesterol):  38.50 (01/31/2010)   Triglycerides:  86 (01/02/2006)  Diabetes Management   HgBA1C:  5.9 (11/21/2008)   Creatinine:  0.9 (01/31/2010)  CBC   WBC:  5.4 (01/31/2010)   RBC:  4.44 (01/31/2010)   Hgb:  14.3 (01/31/2010)   Hct:  41.5 (01/31/2010)   Platelets:  314.0 (01/31/2010)   MCV  93.3 (01/31/2010)   MCHC  34.5 (01/31/2010)   RDW  14.5 (01/31/2010)   PMN:  52.1 (01/31/2010)   Lymphs:  33.1 (01/31/2010)   Monos:  11.0 (01/31/2010)   Eosinophils:  2.7 (01/31/2010)   Basophil:  1.1 (01/31/2010)  Complete Metabolic Panel   Glucose:  84 (01/31/2010)   Sodium:  141 (01/31/2010)   Potassium:  4.0 (01/31/2010)   Chloride:  104 (01/31/2010)   CO2:  30 (01/31/2010)   BUN:  13 (01/31/2010)   Creatinine:  0.9 (01/31/2010)   Albumin:  4.0 (01/31/2010)   Total Protein:  7.0 (01/31/2010)   Calcium:  10.1 (01/31/2010)   Total Bili:  0.8 (01/31/2010)   Alk Phos:  68 (01/31/2010)   SGPT (ALT):  37 (01/31/2010)   SGOT (AST):  31 (01/31/2010)   Current Medications (verified): 1)  Bystolic 20 Mg Tabs (Nebivolol  Hcl) .... 1 By Mouth Qd 2)  Norvasc 5 Mg  Tabs (Amlodipine Besylate) .... 1/2 By Mouth Daily 3)  Valtrex 1 Gm Tabs (Valacyclovir Hcl) .... 1/2 Tab Two Times A Day 4)  Cosopt 2-0.5 %  Soln (Dorzolamide-Timolol) .[name].. 1 Gtt Ou Two Times A Day in Left Eye 5)  K-Vescent 25 Meq  Tbef (Potassium Bicarbonate) .... Take 1 Tablet By Mouth Bid 6)  Claritin 10 Mg Tabs (Loratadine) .... Take 2 By Mouth  Daily 7)  Zovirax 5 % Crea (Acyclovir) .... Use As Needed As Dirr 8)  Triamcinolone Acetonide 0.1 % Crea (Triamcinolone Acetonide) .... Apply Twice Daily To Affected Area As Needed 9)  Fish Oil  Oil (Fish Oil) .... Two Capsules Once Daily 10)  Hair/skin/nails  Tabs (Multiple Vitamins-Minerals) .... Take 1 Tablet By Mouth Once A Day 11)  Cozaar 100 Mg Tabs (Losartan Potassium) .[name].. 1 By Mouth  Qd 12)  Artificial Tears  Soln (Artificial Tear Solution) .... As Needed To Both Eyes 13)  Neti Pot .... Once Daily 14)  L-S-Htp .... Once Daily 15)  Super Cal Meg Vitd 1000mg  K 500 Mg 800 Iu Vitd .... One To Two Tab Daily 16)  Fluticasone Propionate 50 Mcg/act Susp (Fluticasone Propionate) .... Two Puffs Each Nostril   As Needed  Allergies (verified): 1)  ! Sulfa 2)  ! * Flu Shots 3)  ! 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 Exercise-up to 4 times week Caffeine-1-2 per week  Risk Factors: Smoking Status: never (02/08/2010) Passive Smoke Exposure: yes (10/20/2009)  Social History: Hepatitis Risk:  no risk noted HIV Risk:  no risk noted STD Risk:  no risk noted  Review of Systems  The patient denies anorexia, fever, weight loss, weight gain, chest pain, syncope, dyspnea on exertion, peripheral edema, prolonged cough, headaches, hemoptysis, abdominal pain, hematuria, suspicious skin lesions, difficulty walking, depression, unusual weight change, abnormal bleeding, enlarged lymph nodes, and angioedema.    Physical Exam  General:  alert, well-developed, well-nourished, well-hydrated, appropriate dress, normal appearance, healthy-appearing, cooperative to examination, and overweight-appearing.   Head:  normocephalic, atraumatic, no abnormalities observed, and no abnormalities palpated.   Eyes:  vision grossly intact, pupils equal, no nystagmus, and a-v nicking.   Mouth:  Oral mucosa and oropharynx without lesions or exudates.   Teeth in good repair. Neck:  supple, full ROM, no masses, no thyromegaly, no thyroid nodules or tenderness, no JVD, normal carotid upstroke, no carotid bruits, no cervical lymphadenopathy, and no neck tenderness.   Lungs:  normal respiratory effort, no intercostal retractions, no accessory muscle use, normal breath sounds, no dullness, no fremitus, no crackles, and no wheezes.   Heart:  normal rate, regular rhythm, no murmur, no gallop, no rub, and no JVD.   Abdomen:  soft, non-tender, normal bowel sounds, no distention, no masses, no guarding, no rigidity, no rebound tenderness, no abdominal hernia, no inguinal hernia, no hepatomegaly, and no splenomegaly.   Msk:  No deformity or scoliosis noted of thoracic or lumbar spine.   Pulses:  R and L carotid,radial,femoral,dorsalis pedis and posterior tibial pulses are full and equal bilaterally Extremities:  No clubbing, cyanosis, edema, or deformity noted with normal full range of motion of all joints.   Neurologic:  No cranial nerve deficits noted. Station and gait are normal. Plantar reflexes are down-going bilaterally. DTRs are symmetrical throughout. Sensory, motor and coordinative functions appear intact. Skin:  Intact without suspicious lesions or rashes Cervical Nodes:  no anterior cervical adenopathy and no posterior cervical adenopathy.   Axillary Nodes:  no R axillary adenopathy and no L axillary adenopathy.   Inguinal Nodes:  no R inguinal adenopathy and no L inguinal adenopathy.   Psych:  Cognition and judgment appear intact. Alert and cooperative with normal attention span and concentration. No apparent delusions, illusions, hallucinations   Impression & Recommendations:  Problem # 1:  HYPERTENSION (ICD-401.9) Assessment Deteriorated  The following medications were removed from the medication list:    Norvasc 5 Mg Tabs (Amlodipine besylate) .[name]... 1/2 by mouth daily    Cozaar 100 Mg Tabs (Losartan potassium) .[name]... 1 by mouth qd Her  updated medication list for this problem includes:    Bystolic 20 Mg Tabs (Nebivolol hcl) .[name]... 1 by mouth qd    Azor 10-40 Mg Tabs (Amlodipine-olmesartan) ..... One by mouth once daily for high blood pressure  BP today: 146/98 Prior BP: 134/92 (02/05/2010)  Labs Reviewed: K+: 4.0 (01/31/2010) Creat: : 0.9 (01/31/2010)   Chol: 158 (01/31/2010)   HDL: 38.50 (01/31/2010)   LDL: 102 (01/31/2010)   TG: 87.0 (01/31/2010)  Problem # 2:  HYPOKALEMIA (ICD-276.8) Assessment: Improved  Complete Medication List: 1)  Bystolic 20 Mg Tabs (Nebivolol hcl) .[name].. 1 by mouth qd 2)  Valtrex 1 Gm Tabs (Valacyclovir hcl) .... 1/2 tab two times a day 3)  Cosopt 2-0.5 % Soln (Dorzolamide-timolol) .[name].. 1 gtt ou two times a day in left eye 4)  K-vescent 25 Meq Tbef (Potassium bicarbonate) .... Take 1 tablet by mouth bid 5)  Claritin 10 Mg Tabs (Loratadine) .... Take 2 by mouth  daily 6)  Zovirax 5 % Crea (Acyclovir) .... Use as needed as dirr 7)  Triamcinolone Acetonide 0.1 % Crea (Triamcinolone acetonide) .... Apply twice daily to affected area as needed 8)  Fish Oil Oil (Fish oil) .... Two capsules once daily 9)  Hair/skin/nails Tabs (Multiple vitamins-minerals) .... Take 1 tablet by mouth once a day 10)  Artificial Tears Soln (Artificial tear solution) .... As needed to both eyes 11)  Neti Pot  .... Once daily 12)  L-s-htp  .... Once daily 13)  Super Cal Meg Vitd 1000mg  K 500 Mg 800 Iu Vitd  .... One to two tab daily 14)  Fluticasone Propionate 50 Mcg/act Susp (Fluticasone propionate) .... Two puffs each nostril   as needed 15)  Azor 10-40 Mg Tabs (Amlodipine-olmesartan) .... One by mouth once daily for high blood pressure  Patient Instructions: 1)  Please schedule a follow-up appointment in 2 weeks. 2)  It is important that you exercise regularly at least 20 minutes 5 times a week. If you develop chest pain, have severe difficulty breathing, or feel very tired , stop exercising immediately and seek  medical attention. 3)  You need to lose weight. Consider a lower calorie diet and regular exercise.  4)  Check your Blood Pressure regularly. If it is above 140/90: you should make an appointment. Prescriptions: AZOR 10-40 MG TABS (AMLODIPINE-OLMESARTAN) One by mouth once daily for high blood pressure  #112 x 0   Entered and Authorized by:   Etta Grandchild MD   Signed by:  Etta Grandchild MD on 02/08/2010   Method used:   Samples Given   RxID:   [id number]    Orders Added: 1)  Est. Patient Level IV [69629]

## 2010-02-15 NOTE — Assessment & Plan Note (Signed)
Summary: 3 MO ROV /NWS  #   Vital Signs:  Patient profile:   50 year old female Height:      60 inches Weight:      150 pounds BMI:     29.40 Temp:     98.9 degrees F oral BP sitting:   118 / 86  (left arm) Cuff size:   regular  Vitals Entered By: Lanier Prude, CMA(AAMA) (October 20, 2009 7:53 AM) CC: 3 mo f/u c/o Lt side jaw inflammation Is Patient Diabetic? No   Primary Care Provider:  Plotnikov  CC:  3 mo f/u c/o Lt side jaw inflammation.  History of Present Illness: The patient presents for a follow up of HTN, anxiety, depression and sarcoid. C/o L jaw pain - saw Dr Ezzard Standing - TMJ, Motrin helped   Preventive Screening-Counseling & Management  Alcohol-Tobacco     Smoking Status: never     Passive Smoke Exposure: yes  Current Medications (verified): 1)  Bystolic 20 Mg Tabs (Nebivolol Hcl) .[name].. 1 By Mouth Qd 2)  Norvasc 5 Mg  Tabs (Amlodipine Besylate) .... 1/2 By Mouth Daily 3)  Valtrex 1 Gm Tabs (Valacyclovir Hcl) .... 1/2 Tab Two Times A Day 4)  Cosopt 2-0.5 %  Soln (Dorzolamide-Timolol) .[name].. 1 Gtt Ou Two Times A Day in Left Eye 5)  K-Vescent 25 Meq  Tbef (Potassium Bicarbonate) .... Take 1 Tablet By Mouth Bid 6)  Claritin 10 Mg Tabs (Loratadine) .... Take 2 By Mouth  Daily 7)  Multivitamins   Tabs (Multiple Vitamin) .... Take 1 Tablet By Mouth Once A Day 8)  Zovirax 5 % Crea (Acyclovir) .... Use As Needed As Dirr 9)  Triamcinolone Acetonide 0.1 % Crea (Triamcinolone Acetonide) .... Apply Bid To Affected Area Prn 10)  Fish Oil  Oil (Fish Oil) .... Two Capsules Once Daily 11)  Hair/skin/nails  Tabs (Multiple Vitamins-Minerals) .... Take 1 Tablet By Mouth Once A Day 12)  Cozaar 100 Mg Tabs (Losartan Potassium) .[name].. 1 By Mouth Qd 13)  Artificial Tears  Soln (Artificial Tear Solution) .... As Needed To Both Eyes 14)  Neti Pot .... Once Daily 15)  L-S-Htp .... Once Daily 16)  Super Cal Meg Vitd 1000mg  K 500 Mg 800 Iu Vitd .... One To Two Tab Daily 17)  Fluticasone  Propionate 50 Mcg/act Susp (Fluticasone Propionate) .... Two Puffs Each Nostril  Daily 18)  Voltaren 1 % Gel (Diclofenac Sodium) .... Use Two Times A Day On A Joint(S)  Allergies (verified): 1)  ! Sulfa 2)  ! * Flu Shots 3)  ! Augmentin 4)  Cephalexin (Cephalexin) 5)  * Omnicef 6)  Hydrochlorothiazide (Hydrochlorothiazide) 7)  Coreg (Carvedilol) 8)  Sulfamethoxazole (Sulfamethoxazole)  Review of Systems  The patient denies chest pain and dyspnea on exertion.         moody at times  Physical Exam  General:  NAD, less  tired Head:  L TMJ is tender Nose:  External nasal examination shows no deformity or inflammation. Nasal mucosa are pink and moist without lesions or exudates. Mouth:  Oral mucosa and oropharynx without lesions or exudates.  Teeth in good repair. Neck:  No deformities, masses, or tenderness noted. Lungs:  CTA Heart:  Normal rate and regular rhythm. S1 and S2 normal without gallop, murmur, click, rub or other extra sounds. Slight tachy Abdomen:  Bowel sounds positive,abdomen soft and non-tender without masses, organomegaly or hernias noted. Msk:  No deformity or scoliosis noted of thoracic or lumbar spine.  L lat ankle is a little puffy Neurologic:  No cranial nerve deficits noted. Station and gait are normal. Plantar reflexes are down-going bilaterally. DTRs are symmetrical throughout. Sensory, motor and coordinative functions appear intact. Skin:  Intact without suspicious lesions or rashes Psych:  Cognition and judgment appear intact. Alert and cooperative with normal attention span and concentration. No apparent delusions, illusions, hallucinations   Impression & Recommendations:  Problem # 1:  TMJ PAIN (ICD-524.62) L Assessment New  Problem # 2:  DEPRESSION (ICD-311) Assessment: Improved  Problem # 3:  GRIEF REACTION (ICD-309.0) Assessment: Improved  Problem # 4:  HYPERTENSION (ICD-401.9) Assessment: Improved  Her updated medication list for this  problem includes:    Bystolic 20 Mg Tabs (Nebivolol hcl) .[name]... 1 by mouth qd    Norvasc 5 Mg Tabs (Amlodipine besylate) .[name]... 1/2 by mouth daily    Cozaar 100 Mg Tabs (Losartan potassium) .[name]... 1 by mouth qd  BP today: 118/86 Prior BP: 132/84 (09/04/2009)  Labs Reviewed: K+: 3.8 (07/18/2009) Creat: : 0.9 (07/18/2009)   Chol: 139 (08/05/2007)   HDL: 34.4 (08/05/2007)   LDL: 83 (08/05/2007)   TG: 109 (08/05/2007)  Complete Medication List: 1)  Bystolic 20 Mg Tabs (Nebivolol hcl) .[name].. 1 by mouth qd 2)  Norvasc 5 Mg Tabs (Amlodipine besylate) .... 1/2 by mouth daily 3)  Valtrex 1 Gm Tabs (Valacyclovir hcl) .... 1/2 tab two times a day 4)  Cosopt 2-0.5 % Soln (Dorzolamide-timolol) .[name].. 1 gtt ou two times a day in left eye 5)  K-vescent 25 Meq Tbef (Potassium bicarbonate) .... Take 1 tablet by mouth bid 6)  Claritin 10 Mg Tabs (Loratadine) .... Take 2 by mouth  daily 7)  Multivitamins Tabs (Multiple vitamin) .... Take 1 tablet by mouth once a day 8)  Zovirax 5 % Crea (Acyclovir) .... Use as needed as dirr 9)  Triamcinolone Acetonide 0.1 % Crea (Triamcinolone acetonide) .... Apply bid to affected area prn 10)  Fish Oil Oil (Fish oil) .... Two capsules once daily 11)  Hair/skin/nails Tabs (Multiple vitamins-minerals) .... Take 1 tablet by mouth once a day 12)  Cozaar 100 Mg Tabs (Losartan potassium) .[name].. 1 by mouth qd 13)  Artificial Tears Soln (Artificial tear solution) .... As needed to both eyes 14)  Neti Pot  .... Once daily 15)  L-s-htp  .... Once daily 16)  Super Cal Meg Vitd 1000mg  K 500 Mg 800 Iu Vitd  .... One to two tab daily 17)  Fluticasone Propionate 50 Mcg/act Susp (Fluticasone propionate) .... Two puffs each nostril  daily 18)  Voltaren 1 % Gel (Diclofenac sodium) .... Use two times a day on a joint(s) 19)  Vimovo 500-20 Mg Tbec (Naproxen-esomeprazole) .[name].. 1 by mouth once daily - two times a day pc as needed pain  Patient Instructions: 1)  Please schedule a follow-up  appointment in 3 months well w/labs.  Contraindications/Deferment of Procedures/Staging:    Test/Procedure: FLU VAX    Reason for deferment: patient declined  Prescriptions: VIMOVO 500-20 MG TBEC (NAPROXEN-ESOMEPRAZOLE) 1 by mouth once daily - two times a day pc as needed pain  #60 x 3   Entered and Authorized by:   Tresa Garter MD   Signed by:   Tresa Garter MD on 10/20/2009   Method used:   Print then Give to Patient   RxID:   [id number]    Not Administered:    Influenza Vaccine not given due to: patient condition

## 2010-02-15 NOTE — Assessment & Plan Note (Signed)
Summary: 3 mos well//#/cd   Vital Signs:  Patient profile:   50 year old female Height:      60 inches Weight:      148 pounds BMI:     29.01 Temp:     98.6 degrees F oral Pulse rate:   72 / minute Pulse rhythm:   regular Resp:     16 per minute BP sitting:   134 / 92  (left arm) Cuff size:   regular  Vitals Entered By: Lanier Prude, CMA(AAMA) (February 05, 2010 9:35 AM) CC: CPX Is Patient Diabetic? No   Primary Care Provider:  Plotnikov  CC:  CPX.  History of Present Illness: The patient presents for a preventive health examination  C/o peeling between toes L foot  Current Medications (verified): 1)  Bystolic 20 Mg Tabs (Nebivolol Hcl) .[name].. 1 By Mouth Qd 2)  Norvasc 5 Mg  Tabs (Amlodipine Besylate) .... 1/2 By Mouth Daily 3)  Valtrex 1 Gm Tabs (Valacyclovir Hcl) .... 1/2 Tab Two Times A Day 4)  Cosopt 2-0.5 %  Soln (Dorzolamide-Timolol) .[name].. 1 Gtt Ou Two Times A Day in Left Eye 5)  K-Vescent 25 Meq  Tbef (Potassium Bicarbonate) .... Take 1 Tablet By Mouth Bid 6)  Claritin 10 Mg Tabs (Loratadine) .... Take 2 By Mouth  Daily 7)  Zovirax 5 % Crea (Acyclovir) .... Use As Needed As Dirr 8)  Triamcinolone Acetonide 0.1 % Crea (Triamcinolone Acetonide) .... Apply Twice Daily To Affected Area As Needed 9)  Fish Oil  Oil (Fish Oil) .... Two Capsules Once Daily 10)  Hair/skin/nails  Tabs (Multiple Vitamins-Minerals) .... Take 1 Tablet By Mouth Once A Day 11)  Cozaar 100 Mg Tabs (Losartan Potassium) .[name].. 1 By Mouth Qd 12)  Artificial Tears  Soln (Artificial Tear Solution) .... As Needed To Both Eyes 13)  Neti Pot .... Once Daily 14)  L-S-Htp .... Once Daily 15)  Super Cal Meg Vitd 1000mg  K 500 Mg 800 Iu Vitd .... One To Two Tab Daily 16)  Fluticasone Propionate 50 Mcg/act Susp (Fluticasone Propionate) .... Two Puffs Each Nostril   As Needed 17)  Voltaren 1 % Gel (Diclofenac Sodium) .... Use Two Times A Day On A Joint(S) 18)  Vimovo 500-20 Mg Tbec (Naproxen-Esomeprazole) .[name].. 1  By Mouth Once Daily - Two Times A Day Pc As Needed Pain  Allergies (verified): 1)  ! Sulfa 2)  ! * Flu Shots 3)  ! 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 Exercise-up to 4 times week Caffeine-1-2 per week  Family History: Family History Hypertension CVA, CAD Mother died 71yo- lung cancer, sarcoid  Mother- deceased age 47; sarcoidosis; lung cancer Father- deceased age 85; stroke, Hypertension, heart problems Sibling 1- living age 58; Hypertension;smoker, CRF Sibling 2-living age 46;anemic  Review of Systems       The patient complains of depression.  The patient  denies anorexia, fever, weight loss, weight gain, vision loss, decreased hearing, hoarseness, chest pain, syncope, dyspnea on exertion, peripheral edema, prolonged cough, headaches, hemoptysis, abdominal pain, melena, hematochezia, severe indigestion/heartburn, hematuria, incontinence, genital sores, muscle weakness, suspicious skin lesions, transient blindness, difficulty walking, unusual weight change, abnormal bleeding, enlarged lymph nodes, angioedema, and breast masses.    Physical Exam  General:  NAD, less  tired Head:  L TMJ is tender Ears:   External ear exam shows no significant lesions or deformities.  Otoscopic examination reveals clear canals, tympanic membranes are intact bilaterally without bulging, retraction, inflammation or discharge. Hearing is grossly normal bilaterally. Nose:  External nasal examination shows no deformity or inflammation. Nasal mucosa are pink and moist without lesions or exudates. Mouth:  Oral mucosa and oropharynx without lesions or exudates.  Teeth in good repair. Neck:  No deformities, masses, or tenderness noted. Lungs:  CTA Heart:  Normal rate and regular rhythm. S1 and S2 normal without gallop, murmur, click, rub or other extra sounds. Slight tachy Abdomen:  Bowel sounds positive,abdomen soft and non-tender without masses, organomegaly or hernias noted. Msk:  No deformity or scoliosis noted of thoracic or lumbar spine.  L lat ankle is a little puffy Extremities:  No clubbing, cyanosis, edema, or deformity noted with normal full range of motion of all joints.   Neurologic:  No cranial nerve deficits noted. Station and gait are normal. Plantar reflexes are down-going bilaterally. DTRs are symmetrical throughout. Sensory, motor and coordinative functions appear intact. Skin:  Intact without suspicious lesions or rashes Psych:  Cognition and judgment appear intact. Alert and cooperative with normal attention span and concentration. No apparent delusions, illusions, hallucinations   Impression & Recommendations:  Problem # 1:  ROUTINE GENERAL MEDICAL EXAM@HEALTH  CARE FACL (ICD-V70.0) Assessment New Health and age related issues were discussed. Available screening tests and vaccinations were discussed as well. Healthy life style including good diet and exercise was discussed.  The labs were reviewed with the patient.  She declined shots GYN q 12 months  Colon Up to date Orders: EKG w/ Interpretation (93000) OK T-Bone Densitometry (34742)  Problem # 2:  ASTHMA (ICD-493.90) Assessment:  Unchanged On the regimen of medicine(s) reflected in the chart    Problem # 3:  HYPERTENSION (ICD-401.9) Assessment: Unchanged  Her updated medication list for this problem includes:    Bystolic 20 Mg Tabs (Nebivolol hcl) .[name]... 1 by mouth qd    Norvasc 5 Mg Tabs (Amlodipine besylate) .[name]... 1/2 by mouth daily    Cozaar 100 Mg Tabs (Losartan potassium) .[name]... 1 by mouth qd  BP today: 134/92 Prior BP: 122/74 (12/14/2009)  Labs Reviewed: K+: 4.0 (01/31/2010) Creat: : 0.9 (01/31/2010)   Chol: 158 (01/31/2010)   HDL: 38.50 (01/31/2010)   LDL: 102 (01/31/2010)   TG: 87.0 (01/31/2010)  Problem # 4:  DEPRESSION (ICD-311) Assessment: Unchanged  Complete Medication List: 1)  Bystolic 20 Mg Tabs (Nebivolol hcl) .[name].. 1 by mouth qd 2)  Norvasc 5 Mg Tabs (Amlodipine besylate) .... 1/2 by mouth daily 3)  Valtrex 1 Gm Tabs (Valacyclovir hcl) .... 1/2 tab two times a day 4)  Cosopt 2-0.5 % Soln (Dorzolamide-timolol) .[name].. 1 gtt ou two times a day in left eye 5)  K-vescent 25 Meq Tbef (Potassium bicarbonate) .... Take 1 tablet by mouth bid 6)  Claritin 10 Mg Tabs (Loratadine) .... Take 2 by mouth  daily 7)  Zovirax 5 % Crea (Acyclovir) .... Use as needed as dirr 8)  Triamcinolone Acetonide 0.1 %  Crea (Triamcinolone acetonide) .... Apply twice daily to affected area as needed 9)  Fish Oil Oil (Fish oil) .... Two capsules once daily 10)  Hair/skin/nails Tabs (Multiple vitamins-minerals) .... Take 1 tablet by mouth once a day 11)  Cozaar 100 Mg Tabs (Losartan potassium) .[name].. 1 by mouth qd 12)  Artificial Tears Soln (Artificial tear solution) .... As needed to both eyes 13)  Neti Pot  .... Once daily 14)  L-s-htp  .... Once daily 15)  Super Cal Meg Vitd 1000mg  K 500 Mg 800 Iu Vitd  .... One to two tab daily 16)  Fluticasone Propionate 50 Mcg/act Susp (Fluticasone propionate) .... Two puffs each nostril   as needed  Patient Instructions: 1)  Please schedule a follow-up appointment in 3 months. 2)  BMP  prior to visit, ICD-9:401.1   Orders Added: 1)  EKG w/ Interpretation [93000] 2)  T-Bone Densitometry [77080] 3)  Est. Patient age 34-64 [68]    Contraindications/Deferment of Procedures/Staging:    Test/Procedure: Pneumovax vaccine    Reason for deferment: patient declined

## 2010-02-21 NOTE — Assessment & Plan Note (Signed)
Summary: ?bp meds-lb   Vital Signs:  Patient profile:   50 year old female Menstrual status:  postmenopausal Height:      60 inches Weight:      147 pounds BMI:     28.81 Temp:     98.4 degrees F oral Pulse rate:   76 / minute Pulse rhythm:   regular Resp:     16 per minute BP sitting:   130 / 85  (left arm) Cuff size:   regular  Vitals Entered By: Lanier Prude, CMA(AAMA) (February 13, 2010 11:32 AM) CC: f/u to discuss new medication that caused headache and changes in stool color Is Patient Diabetic? No Comments pt took last Azor on Saturday   Primary Care Provider:  Plotnikov  CC:  f/u to discuss new medication that caused headache and changes in stool color.  History of Present Illness: C/o HA on Azor. She stopped Azor and HA stopped, then she started back on Losartan/Amlod 5. F/u HTN  C/o stress  Allergies: 1)  ! Sulfa 2)  ! * Flu Shots 3)  ! 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 Exercise-up to 4 times week Caffeine-1-2 per week  Review of Systems  The patient denies fever, weight loss, weight gain, chest pain, syncope, and dyspnea on exertion.    Physical Exam  General:  alert, well-developed, well-nourished, well-hydrated, appropriate dress, normal appearance, healthy-appearing, cooperative to examination, and  overweight-appearing.   Mouth:  Oral mucosa and oropharynx without lesions or exudates.  Teeth in good repair. Lungs:  normal respiratory effort, no intercostal retractions, no accessory muscle use, normal breath sounds, no dullness, no fremitus, no crackles, and no wheezes.   Heart:  normal rate, regular rhythm, no murmur, no gallop, no rub, and no JVD.   Abdomen:  soft, non-tender, normal bowel sounds, no distention, no masses, no guarding, no rigidity, no rebound tenderness, no abdominal hernia, no inguinal hernia, no hepatomegaly, and no splenomegaly.   Msk:  No deformity or scoliosis noted of thoracic or lumbar spine.   Neurologic:  No cranial nerve deficits noted. Station and gait are normal. Plantar reflexes are down-going bilaterally. DTRs are symmetrical throughout. Sensory, motor and coordinative functions appear intact. Skin:  Intact without suspicious lesions or rashes Psych:  Cognition and judgment appear intact. Alert and cooperative with normal attention span and concentration. No apparent delusions, illusions, hallucinations   Impression & Recommendations:  Problem # 1:  HEADACHE (ICD-784.0) due to azor Assessment New D/c Azor  Problem # 2:  HYPERTENSION (ICD-401.9) Assessment: Comment Only  The following medications were removed from the medication list:    Azor 10-40 Mg Tabs (Amlodipine-olmesartan) ..... One by mouth once daily for high blood pressure Her updated medication list for this problem includes:    Losartan Potassium 100 Mg Tabs (Losartan potassium) .[name]... 1 by mouth once daily for blood pressure    Amlodipine Besylate 5 Mg Tabs (Amlodipine besylate) .[name]... 1 by mouth qd    Bystolic 20 Mg Tabs (Nebivolol hcl) .Marland KitchenMarland KitchenMarland KitchenMarland Kitchen 1  by mouth qd  Problem # 3:  Stress Assessment: Unchanged Discussed. Coping OK. Started to exercise  Complete Medication List: 1)  Losartan Potassium 100 Mg Tabs (Losartan potassium) .[name].. 1 by mouth once daily for blood pressure 2)  Amlodipine  Besylate 5 Mg Tabs (Amlodipine besylate) .[name].. 1 by mouth qd 3)  Bystolic 20 Mg Tabs (Nebivolol hcl) .[name].. 1 by mouth qd 4)  Valtrex 1 Gm Tabs (Valacyclovir hcl) .... 1/2 tab two times a day 5)  Cosopt 2-0.5 % Soln (Dorzolamide-timolol) .[name].. 1 gtt ou two times a day in left eye 6)  K-vescent 25 Meq Tbef (Potassium bicarbonate) .... Take 1 tablet by mouth bid 7)  Claritin 10 Mg Tabs (Loratadine) .... Take 2 by mouth  daily 8)  Zovirax 5 % Crea (Acyclovir) .... Use as needed as dirr 9)  Triamcinolone Acetonide 0.1 % Crea (Triamcinolone acetonide) .... Apply twice daily to affected area as needed 10)  Fish Oil Oil (Fish oil) .... Two capsules once daily 11)  Hair/skin/nails Tabs (Multiple vitamins-minerals) .... Take 1 tablet by mouth once a day 12)  Artificial Tears Soln (Artificial tear solution) .... As needed to both eyes 13)  Neti Pot  .... Once daily 14)  L-s-htp  .... Once daily 15)  Super Cal Meg Vitd 1000mg  K 500 Mg 800 Iu Vitd  .... One to two tab daily 16)  Fluticasone Propionate 50 Mcg/act Susp (Fluticasone propionate) .... Two puffs each nostril   as needed  Patient Instructions: 1)  Keep return office visit   Prescriptions: LOSARTAN POTASSIUM 100 MG TABS (LOSARTAN POTASSIUM) 1 by mouth once daily for blood pressure  #30 x 12   Entered and Authorized by:   Tresa Garter MD   Signed by:   Tresa Garter MD on 02/13/2010   Method used:   Print then Give to Patient   RxID:   [id number] AMLODIPINE BESYLATE 5 MG TABS (AMLODIPINE BESYLATE) 1 by mouth qd  #30 x 12   Entered and Authorized by:   Tresa Garter MD   Signed by:   Tresa Garter MD on 02/13/2010   Method used:   Print then Give to Patient   RxID:   [id number]    Orders Added: 1)  Est. Patient Level IV [13244]

## 2010-02-21 NOTE — Progress Notes (Signed)
Summary: [name]   Phone Note Call from Patient   Summary of Call: Please see call a nurse report and phone note - pt has apt tomorrow for eval.  Initial call taken by: Lamar Sprinkles, CMA,  February 12, 2010 4:39 PM  Follow-up for Phone Call        Thank you!  Follow-up by: Tresa Garter MD,  February 12, 2010 6:26 PM

## 2010-02-21 NOTE — Progress Notes (Signed)
Summary: Call Report  Phone Note Other Incoming   Caller: [facility] Summary of Call: [facility] Triage Call Report Triage Record Num: [id number] Operator: Edgar Frisk Patient Name: [name] Call Date & Time: 02/10/2010 12:00:03AM Patient Phone: [phone] PCP: Sonda Primes Patient Gender: Female PCP Fax : [phone] Patient DOB: 08-25-1960 Practice Name: [facility] Reason for Call: Pt calling reports she was placed on new BP med Azor , BP tonight 129/83. States had headache earlier but is relieved now. Pt concerned Med caused headache. Wants to know if should continue med. Advised to continue med in am , IF has another headache after taking med to call back Also call for any other questions or concerns Protocol(s) Used: Medication Question Calls, No Triage (Adults) Recommended Outcome per Protocol: Provide Information or Advice Only Reason for Outcome: Caller has medication question only and triager answers question Care Advice:  ~ 01/ Initial call taken by: Margaret Pyle, CMA,  February 12, 2010 8:25 AM  Follow-up for Phone Call        Noted Thank you!  Follow-up by: Tresa Garter MD,  February 12, 2010 6:00 PM

## 2010-02-21 NOTE — Progress Notes (Signed)
Summary: HEADACHES FROM BP MEDS?  Phone Note Call from Patient   Caller: Patient Summary of Call: PT SAW DR Rolan Lipa AND WAS GIVEN A NEW BP MEDICATION.  BP IS LOWER, BUT SHE IS HAVING HEADACHES.  SHE WANTS A CALL.  [phone] Initial call taken by: Hilarie Fredrickson,  February 09, 2010 3:23 PM  Follow-up for Phone Call        Returned call to patient who stated that she took her first dose of Azor today and started having headaches. She did mention a BP reading of 129/83, HR 68. She wants to know if she needs to cut tablet in half, or go back to amlodipine and lorsartan? Patient would like to know what MD suggest. Thanks.Alvy Beal Archie CMA  February 09, 2010 5:03 PM   Additional Follow-up for Phone Call Additional follow up Details #1::        that BP is great, if she can stay on Azor it wold be great Additional Follow-up by: Etta Grandchild MD,  February 12, 2010 7:15 AM    Additional Follow-up for Phone Call Additional follow up Details #2::    left detailed vm on pt's # Follow-up by: Lamar Sprinkles, CMA,  February 12, 2010 2:43 PM

## 2010-02-22 ENCOUNTER — Ambulatory Visit: Payer: Self-pay | Admitting: Internal Medicine

## 2010-02-27 ENCOUNTER — Ambulatory Visit (INDEPENDENT_AMBULATORY_CARE_PROVIDER_SITE_OTHER): Payer: 59 | Admitting: Internal Medicine

## 2010-02-27 ENCOUNTER — Encounter: Payer: Self-pay | Admitting: Internal Medicine

## 2010-02-27 DIAGNOSIS — I10 Essential (primary) hypertension: Secondary | ICD-10-CM

## 2010-02-27 DIAGNOSIS — N760 Acute vaginitis: Secondary | ICD-10-CM | POA: Insufficient documentation

## 2010-03-07 NOTE — Assessment & Plan Note (Signed)
Summary: 2 wk f/u #/cd   Vital Signs:  Patient profile:   50 year old female Menstrual status:  postmenopausal Height:      60 inches Weight:      150 pounds BMI:     29.40 Temp:     98.5 degrees F oral Pulse rate:   76 / minute Pulse rhythm:   regular Resp:     16 per minute BP sitting:   127 / 85  (left arm) Cuff size:   regular  Vitals Entered By: Lanier Prude, CMA(AAMA) (February 27, 2010 7:58 AM) CC: f/u Is Patient Diabetic? No   Primary Care Provider:  Namon Villarin  CC:  f/u.  History of Present Illness: F/u HTN. BP is nl at home C/o yeast infection - d/c and some itching  Current Medications (verified): 1)  Losartan Potassium 100 Mg Tabs (Losartan Potassium) .[name].. 1 By Mouth Once Daily For Blood Pressure 2)  Amlodipine Besylate 5 Mg Tabs (Amlodipine Besylate) .[name].. 1 By Mouth Qd 3)  Bystolic 20 Mg Tabs (Nebivolol Hcl) .[name].. 1 By Mouth Qd 4)  Valtrex 1 Gm Tabs (Valacyclovir Hcl) .... 1/2 Tab Two Times A Day 5)  Cosopt 2-0.5 %  Soln (Dorzolamide-Timolol) .[name].. 1 Gtt Ou Two Times A Day in Left Eye 6)  K-Vescent 25 Meq  Tbef (Potassium Bicarbonate) .... Take 1 Tablet By Mouth Bid 7)  Claritin 10 Mg Tabs (Loratadine) .... Take 2 By Mouth  Daily 8)  Zovirax 5 % Crea (Acyclovir) .... Use As Needed As Dirr 9)  Triamcinolone Acetonide 0.1 % Crea (Triamcinolone Acetonide) .... Apply Twice Daily To Affected Area As Needed 10)  Fish Oil  Oil (Fish Oil) .... Two Capsules Once Daily 11)  Hair/skin/nails  Tabs (Multiple Vitamins-Minerals) .... Take 1 Tablet By Mouth Once A Day 12)  Artificial Tears  Soln (Artificial Tear Solution) .... As Needed To Both Eyes 13)  Neti Pot .... Once Daily 14)  L-S-Htp .... Once Daily 15)  Super Cal Meg Vitd 1000mg  K 500 Mg 800 Iu Vitd .... One To Two Tab Daily 16)  Fluticasone Propionate 50 Mcg/act Susp (Fluticasone Propionate) .... Two Puffs Each Nostril   As Needed  Allergies (verified): 1)  ! Sulfa 2)  ! * Flu Shots 3)  ! Augmentin 4)   Cephalexin (Cephalexin) 5)  * Omnicef 6)  Hydrochlorothiazide (Hydrochlorothiazide) 7)  Coreg (Carvedilol) 8)  Sulfamethoxazole (Sulfamethoxazole)  Physical Exam  General:  alert, well-developed, well-nourished, well-hydrated, appropriate dress, normal appearance, healthy-appearing, cooperative to examination, and overweight-appearing.   Nose:  External nasal examination shows no deformity or inflammation. Nasal mucosa are pink and moist without lesions or exudates. Mouth:  Oral mucosa and oropharynx without lesions or exudates.  Teeth in good repair. Neck:  supple, full ROM, no masses, no thyromegaly, no thyroid nodules or tenderness, no JVD, normal carotid upstroke, no carotid bruits, no cervical lymphadenopathy, and no neck tenderness.   Lungs:  normal respiratory effort, no intercostal retractions, no accessory muscle use, normal breath sounds, no dullness, no fremitus, no crackles, and no wheezes.   Heart:  normal rate, regular rhythm, no murmur, no gallop, no rub, and no JVD.   Abdomen:  soft, non-tender, normal bowel sounds, no distention, no masses, no guarding, no rigidity, no rebound tenderness, no abdominal hernia, no inguinal hernia, no hepatomegaly, and no splenomegaly.   Msk:  No deformity or scoliosis noted of thoracic or lumbar spine.   Neurologic:  No cranial nerve deficits noted. Station and gait are  normal. Plantar reflexes are down-going bilaterally. DTRs are symmetrical throughout. Sensory, motor and coordinative functions appear intact.   Impression & Recommendations:  Problem # 1:  HYPERTENSION (ICD-401.9) Assessment Improved  Her updated medication list for this problem includes:    Losartan Potassium 100 Mg Tabs (Losartan potassium) .[name]... 1 by mouth once daily for blood pressure    Amlodipine Besylate 5 Mg Tabs (Amlodipine besylate) .[name]... 1 by mouth qd    Bystolic 20 Mg Tabs (Nebivolol hcl) .[name]... 1 by mouth qd  Problem # 2:  VAGINITIS (ICD-616.10) Assessment:  New Diflucan  Complete Medication List: 1)  Losartan Potassium 100 Mg Tabs (Losartan potassium) .[name].. 1 by mouth once daily for blood pressure 2)  Amlodipine Besylate 5 Mg Tabs (Amlodipine besylate) .[name].. 1 by mouth qd 3)  Bystolic 20 Mg Tabs (Nebivolol hcl) .[name].. 1 by mouth qd 4)  Valtrex 1 Gm Tabs (Valacyclovir hcl) .... 1/2 tab two times a day 5)  Cosopt 2-0.5 % Soln (Dorzolamide-timolol) .[name].. 1 gtt ou two times a day in left eye 6)  K-vescent 25 Meq Tbef (Potassium bicarbonate) .... Take 1 tablet by mouth bid 7)  Claritin 10 Mg Tabs (Loratadine) .... Take 2 by mouth  daily 8)  Zovirax 5 % Crea (Acyclovir) .... Use as needed as dirr 9)  Triamcinolone Acetonide 0.1 % Crea (Triamcinolone acetonide) .... Apply twice daily to affected area as needed 10)  Fish Oil Oil (Fish oil) .... Two capsules once daily 11)  Hair/skin/nails Tabs (Multiple vitamins-minerals) .... Take 1 tablet by mouth once a day 12)  Artificial Tears Soln (Artificial tear solution) .... As needed to both eyes 13)  Neti Pot  .... Once daily 14)  L-s-htp  .... Once daily 15)  Super Cal Meg Vitd 1000mg  K 500 Mg 800 Iu Vitd  .... One to two tab daily 16)  Fluticasone Propionate 50 Mcg/act Susp (Fluticasone propionate) .... Two puffs each nostril   as needed 17)  Diflucan 150 Mg Tabs (Fluconazole) .[name].. 1 by mouth once daily once for yeast infection  Patient Instructions: 1)  Please schedule a follow-up appointment in 3 months. 2)  BMP prior to visit, ICD-9:401.1 Prescriptions: DIFLUCAN 150 MG TABS (FLUCONAZOLE) 1 by mouth once daily once for yeast infection  #1 x 1   Entered and Authorized by:   Tresa Garter MD   Signed by:   Tresa Garter MD on 02/27/2010   Method used:   Electronically to        [facility] (retail)       [address]       [place], [place]  [id number]       Ph: [phone]       Fax: [phone]   RxID:   [id number]    Orders Added: 1)  Est. Patient  Level III [16010]

## 2010-04-30 ENCOUNTER — Other Ambulatory Visit: Payer: Self-pay

## 2010-04-30 ENCOUNTER — Other Ambulatory Visit: Payer: Self-pay | Admitting: Internal Medicine

## 2010-04-30 DIAGNOSIS — I10 Essential (primary) hypertension: Secondary | ICD-10-CM

## 2010-05-02 ENCOUNTER — Encounter: Payer: Self-pay | Admitting: Critical Care Medicine

## 2010-05-02 ENCOUNTER — Other Ambulatory Visit (INDEPENDENT_AMBULATORY_CARE_PROVIDER_SITE_OTHER): Payer: 59

## 2010-05-02 DIAGNOSIS — I10 Essential (primary) hypertension: Secondary | ICD-10-CM

## 2010-05-02 LAB — BASIC METABOLIC PANEL
BUN: 17 mg/dL (ref 6–23)
CO2: 32 mEq/L (ref 19–32)
Calcium: 9.8 mg/dL (ref 8.4–10.5)
Chloride: 104 mEq/L (ref 96–112)
Creatinine, Ser: 1 mg/dL (ref 0.4–1.2)
GFR: 78.87 mL/min (ref >60.00)
Glucose, Bld: 79 mg/dL (ref 70–99)
Potassium: 4.1 mEq/L (ref 3.5–5.1)
Sodium: 143 mEq/L (ref 135–145)

## 2010-05-04 ENCOUNTER — Ambulatory Visit (INDEPENDENT_AMBULATORY_CARE_PROVIDER_SITE_OTHER): Payer: 59 | Admitting: Critical Care Medicine

## 2010-05-04 ENCOUNTER — Encounter: Payer: Self-pay | Admitting: Critical Care Medicine

## 2010-05-04 VITALS — BP 120/70 | HR 70 | Temp 97.5°F | Ht 60.0 in | Wt 149.2 lb

## 2010-05-04 DIAGNOSIS — J309 Allergic rhinitis, unspecified: Secondary | ICD-10-CM

## 2010-05-04 DIAGNOSIS — D869 Sarcoidosis, unspecified: Secondary | ICD-10-CM

## 2010-05-04 DIAGNOSIS — J45909 Unspecified asthma, uncomplicated: Secondary | ICD-10-CM

## 2010-05-04 MED ORDER — LEVOCETIRIZINE DIHYDROCHLORIDE 5 MG PO TABS: 5.0000 mg | ORAL_TABLET | Freq: Every evening | ORAL | Status: DC

## 2010-05-04 NOTE — Progress Notes (Signed)
Subjective:    Patient ID: [name], female    DOB: [birth year], 50 y.o.   MRN: [id number]  HPI 50 y.o. AAF  female with a history of sarcoidosis.  January 11, 2009 4:49 PM  Pt does ok except at night gets thick mucous in sinuses. There is some pressure in sinuses. The pt hurts across the both frontals and r maxillary sinus  Pt had depo injection at [facility] two days ago plus mucinex  April 25, 2009 1:33 PM  F/u sarcoid. Dyspnea only with exertion fast or upstairs. Sinus pressure is less. Has sneezing and pndrip esp if outdoors. Needs clariten rx. This helps the rhinitis. No real cough and if does is dry. No f/c/s. Has occ hot flashes.  07/03/09 Acute OV KC:  the pt is a 50 y/o female who comes in today for an acute sick visit. She has known sarcoidosis that has been fairly stable, but also has recurrent issues with sinusitis. She began to have sinus issues about 2-3 weeks ago, and was treated at Urgent care with a zpak. Despite taking this, she has continued to have nasal congestion, postnasal drip, and cough with purulent material. She also has some chest congestion, as well as doe above her usual baseline. She denies any f/c/s.  September 04, 2009 11:39 AM  Pt seen 6/20 for acute ov and rx levaquin and pulse pred with sinus rinse.  Now is better. Less cough . Has stress at work. Wants to exercise more .  12/14/09--Presents for follow up. States breathing is doing well. no new complaints. would like to see cxr from august, states was told it was "different" than previous. We reviewed her xray which showed chronic sarcoid changes w/ slight progression of scarring in RUL. She is active we talked about advancing her exericse as tolerated. Denies chest pain, orthopnea, hemoptysis, fever, n/v/d, edema, headache.   05/04/2010 Since last ov, no major issues. Here for F/u.  Does exercise before work, now BP is better.  No real cough.  Notes more allergy issues. Pt denies any significant sore throat, notes  nasal congestion but no  excess secretions, fever, chills, sweats, unintended weight loss, pleurtic or exertional chest pain, orthopnea PND, or leg swelling Pt denies any increase in rescue therapy over baseline, denies waking up needing it or having any early am or nocturnal exacerbations of coughing/wheezing/or dyspnea. Pt also denies any obvious fluctuation in symptoms with  weather or environmental change or other alleviating or aggravating factors     Review of Systems Constitutional:   No  weight loss, night sweats,  Fevers, chills, fatigue, lassitude. HEENT:   No headaches,  Difficulty swallowing,  Tooth/dental problems,  Sore throat,                Notes  Sneezing, no  itching, ear ache, notes nasal congestion and  post nasal drip,   CV:  No chest pain,  Orthopnea, PND, swelling in lower extremities, anasarca, dizziness, palpitations  GI  No heartburn, indigestion, abdominal pain, nausea, vomiting, diarrhea, change in bowel habits, loss of appetite  Resp: No shortness of breath with exertion or at rest.  No excess mucus, no productive cough,  No non-productive cough,  No coughing up of blood.  No change in color of mucus.  No wheezing.  No chest wall deformity  Skin: no rash or lesions.  GU: no dysuria, change in color of urine, no urgency or frequency.  No flank pain.  MS:  No joint  pain or swelling.  No decreased range of motion.  No back pain.  Psych:  No change in mood or affect. No depression or anxiety.  No memory loss.     Objective:   Physical Exam Gen: Pleasant, well-nourished, in no distress,  normal affect  ENT: No lesions,  mouth clear,  oropharynx clear, no postnasal drip  Neck: No JVD, no TMG, no carotid bruits  Lungs: No use of accessory muscles, no dullness to percussion, clear without rales or rhonchi  Cardiovascular: RRR, heart sounds normal, no murmur or gallops, no peripheral edema  Abdomen: soft and NT, no HSM,  BS normal  Musculoskeletal: No  deformities, no cyanosis or clubbing  Neuro: alert, non focal  Skin: Warm, no lesions or rashes        Assessment & Plan:

## 2010-05-04 NOTE — Patient Instructions (Signed)
Stop claritin Start Xyzal one daily No other med changes Return 4 months

## 2010-05-05 NOTE — Assessment & Plan Note (Signed)
Stable inactive sarcoidosis

## 2010-05-05 NOTE — Assessment & Plan Note (Signed)
Mild intermittent asthma, stable Plan No change in medications. Return in       4 months

## 2010-05-05 NOTE — Assessment & Plan Note (Signed)
Moderate allergic rhinitis, failed claritin Plan  d/c claritin, start Xyzal

## 2010-05-07 ENCOUNTER — Encounter: Payer: Self-pay | Admitting: Internal Medicine

## 2010-05-07 ENCOUNTER — Ambulatory Visit (INDEPENDENT_AMBULATORY_CARE_PROVIDER_SITE_OTHER): Payer: 59 | Admitting: Internal Medicine

## 2010-05-07 DIAGNOSIS — R51 Headache: Secondary | ICD-10-CM

## 2010-05-07 DIAGNOSIS — F329 Major depressive disorder, single episode, unspecified: Secondary | ICD-10-CM

## 2010-05-07 DIAGNOSIS — I10 Essential (primary) hypertension: Secondary | ICD-10-CM

## 2010-05-07 DIAGNOSIS — F411 Generalized anxiety disorder: Secondary | ICD-10-CM

## 2010-05-07 DIAGNOSIS — F4321 Adjustment disorder with depressed mood: Secondary | ICD-10-CM

## 2010-05-07 MED ORDER — SALICYLIC ACID 6 % EX LOTN: 1.0000 "application " | TOPICAL_LOTION | CUTANEOUS | Status: DC

## 2010-05-07 NOTE — Progress Notes (Signed)
  Subjective:    Patient ID: [name], female    DOB: 22-May-1960, 50 y.o.   MRN: [id number]  HPI  The patient presents for a follow-up of  chronic hypertension, chronic dyslipidemia, sarcoidosis, depression controlled with medicines    Review of Systems  Constitutional: Negative for fever, appetite change and fatigue.  HENT: Negative for ear pain, mouth sores and voice change.   Respiratory: Negative for cough.   Cardiovascular: Negative for leg swelling.  Genitourinary: Negative for menstrual problem and pelvic pain.  Skin: Positive for rash (between toes).  Neurological: Negative for syncope.  Psychiatric/Behavioral: Negative for behavioral problems. The patient is nervous/anxious.    Wt Readings from Last 3 Encounters:  05/07/10 147 lb (66.679 kg)  05/04/10 149 lb 3.2 oz (67.677 kg)  02/27/10 150 lb (68.04 kg)        Objective:   Physical Exam  Constitutional: She appears well-developed and well-nourished. No distress.  HENT:  Head: Normocephalic.  Right Ear: External ear normal.  Left Ear: External ear normal.  Nose: Nose normal.  Mouth/Throat: Oropharynx is clear and moist.  Eyes: Conjunctivae are normal. Pupils are equal, round, and reactive to light. Right eye exhibits no discharge. Left eye exhibits no discharge.  Neck: Normal range of motion. Neck supple. No JVD present. No tracheal deviation present. No thyromegaly present.  Cardiovascular: Normal rate, regular rhythm and normal heart sounds.   Pulmonary/Chest: No stridor. No respiratory distress. She has no wheezes.  Abdominal: Soft. Bowel sounds are normal. She exhibits no distension and no mass. There is no tenderness. There is no rebound and no guarding.  Musculoskeletal: She exhibits no edema and no tenderness.  Lymphadenopathy:    She has no cervical adenopathy.  Neurological: She displays normal reflexes. No cranial nerve deficit. She exhibits normal muscle tone. Coordination normal.  Skin: Rash  (dry skin between toes) noted. No erythema.  Psychiatric: Her behavior is normal. Judgment and thought content normal.       Better - not sad        Lab Results  Component Value Date   WBC 5.4 01/31/2010   HGB 14.3 01/31/2010   HCT 41.5 01/31/2010   PLT 314.0 01/31/2010   CHOL 158 01/31/2010   TRIG 87.0 01/31/2010   HDL 38.50* 01/31/2010   ALT 37* 01/31/2010   AST 31 01/31/2010   NA 143 05/02/2010   K 4.1 05/02/2010   CL 104 05/02/2010   CREATININE 1.0 05/02/2010   BUN 17 05/02/2010   CO2 32 05/02/2010   TSH 2.37 01/31/2010   HGBA1C 5.9 11/21/2008     Assessment & Plan:  DEPRESSION Better - exercise helped  GRIEF REACTION Better  HYPERTENSION Cont Rx  HEADACHE Better now.  ANXIETY Better

## 2010-05-07 NOTE — Assessment & Plan Note (Signed)
Better - exercise helped

## 2010-05-07 NOTE — Assessment & Plan Note (Signed)
Better  

## 2010-05-07 NOTE — Assessment & Plan Note (Signed)
Cont Rx 

## 2010-05-07 NOTE — Assessment & Plan Note (Signed)
Better now 

## 2010-05-19 ENCOUNTER — Ambulatory Visit (INDEPENDENT_AMBULATORY_CARE_PROVIDER_SITE_OTHER): Payer: 59 | Admitting: Family Medicine

## 2010-05-19 ENCOUNTER — Encounter: Payer: Self-pay | Admitting: Family Medicine

## 2010-05-19 DIAGNOSIS — R229 Localized swelling, mass and lump, unspecified: Secondary | ICD-10-CM

## 2010-05-19 DIAGNOSIS — M109 Gout, unspecified: Secondary | ICD-10-CM

## 2010-05-19 DIAGNOSIS — R609 Edema, unspecified: Secondary | ICD-10-CM

## 2010-05-19 MED ORDER — METHYLPREDNISOLONE ACETATE 80 MG/ML IJ SUSP
120.0000 mg | Freq: Once | INTRAMUSCULAR | Status: AC
  Administered 2010-05-19: 120 mg via INTRAMUSCULAR

## 2010-05-19 MED ORDER — HYDROCODONE-ACETAMINOPHEN 10-660 MG PO TABS: 1.0000 | ORAL_TABLET | Freq: Four times a day (QID) | ORAL | Status: DC | PRN

## 2010-05-19 NOTE — Patient Instructions (Addendum)
This is gout. The shot will reduce swelling. Use the pain medications as needed. Wear your wrist brace every night in bed to prevent another flare up. Follow up as needed. Given a note to be out of work on 05-21-10.

## 2010-05-19 NOTE — Progress Notes (Signed)
  Subjective:    Patient ID: [name], female    DOB: [birth year], 50 y.o.   MRN: [id number]  HPI Here with 3 days of swelling and severe pain in the right 3rd finger. No recent trauma. She woke up with this finger bothering her 3 mornings ago   Review of Systems     Objective:   Physical Exam        Assessment & Plan:

## 2010-05-21 ENCOUNTER — Telehealth: Payer: Self-pay

## 2010-05-21 NOTE — Telephone Encounter (Signed)
Noted. OV if still having issues.

## 2010-05-21 NOTE — Telephone Encounter (Signed)
[facility] Triage Call Report Triage Record Num: [id number] Operator: Durward Mallard [facility] Patient Name: [name] Call Date & Time: 05/19/2010 9:58:34AM Patient Phone: [phone] PCP: Sonda Primes Patient Gender: Female PCP Fax : [phone] Patient DOB: Jun 10, 1960 Practice Name: [facility] Reason for Call: Patient is calling and states that her rt middle finger is hurting and swollen and started 05/17/10 and went to UC yesterday but no better; today the sx are worse and the pain is now going down the wrist; can not even write or type; needs to hold hand in an upright position; no injury that she is aware of; would like to have an appt today as soon as possible; called office for an appt; appt for 11:15am today; pt will comply; Hand Non-Injury Guideline Protocol(s) Used: Hand Non-Injury Recommended Outcome per Protocol: See Provider within 4 hours Reason for Outcome: Severe pain with movement that limits normal activities Care Advice: ~ Remove any rings on the fingers of the affected hand, if possible. 05/19/2010 10:10:36AM Page 1 of 1 [email or web address]

## 2010-05-22 NOTE — Telephone Encounter (Signed)
Pt was seen at Saturday clinic by Dr Clent Ridges

## 2010-05-26 ENCOUNTER — Other Ambulatory Visit: Payer: Self-pay | Admitting: Internal Medicine

## 2010-05-28 ENCOUNTER — Other Ambulatory Visit: Payer: Self-pay | Admitting: Internal Medicine

## 2010-05-28 MED ORDER — AMLODIPINE BESYLATE 5 MG PO TABS: 5.0000 mg | ORAL_TABLET | Freq: Every day | ORAL | Status: DC

## 2010-05-29 NOTE — Assessment & Plan Note (Signed)
[facility]                             PULMONARY OFFICE NOTE   NAME:[name], [name]                      MRN:          [id number]  DATE:06/05/2006                            DOB:          [birth year]    HISTORY OF THE PRESENT ILLNESS:  The patient is a 50 year old African-  American female with a history of sarcoidosis.  The patient notes a dry  cough particularly in the morning, some postnasal drainage and no real  heartburn.  She is on Singulair daily.   PHYSICAL EXAMINATION:  VITAL SIGNS:  On examination temp is 98.  Blood  pressure 116/80.  Pulse 76.  Saturation 97% on room air.  CHEST:  The chest is  shown to be clear today without adventitious  breath sounds.  There are no wheezes, rales or rhonchi noted.  HEART:  Cardiac exam shows a regular rate and rhythm without S3. Normal  S1 and S2.  ABDOMEN:  The abdomen is soft and nontender.  EXTREMITIES:  The extremities show no edema or clubbing.  SKIN:  The skin is clear.  NEUROLOGIC:  The neurological examination is intact.  HEENT AND NECK:  The head, eyes, ears, nose and throat exam, and neck  exam shows no jugular venous distention and no lymphadenopathy.  Oropharynx is clear.  The neck is supple.   IMPRESSION:  Allergic rhinitis with postnasal drip syndrome.   PLAN:  The plan for the patient is to:  1. Maintain Singulair daily.  2. We will begin Nasonex two sprays to each nostril daily.  3. We will see the patient back when he returns for follow up.     Charlcie Cradle Delford Field, MD, [facility]  Electronically Signed    PEW/MedQ  DD: 06/05/2006  DT: 06/05/2006  Job #: [id number]   cc:   Georgina Quint. Plotnikov, MD

## 2010-05-29 NOTE — Assessment & Plan Note (Signed)
[facility]                             PULMONARY OFFICE NOTE   NAME:Hulbert, AQUARIUS TREMPER                      MRN:          [id number]  DATE:12/09/2006                            DOB:          June 30, 1960    Ms. Fread returns in followup. This is a 50 year old African American  female with a history of sarcoidosis, recent acute bronchitis and  treated with a five day course of Avelox, now improving. Still somewhat  short of breath with exertion, having an occasional cough.   Maintains Nasonex one spray each nostril daily, Singulair 10 mg daily.  She is off of the Astelin.   PHYSICAL EXAMINATION:  Temperature 98.0, blood pressure 108/76, pulse  93, saturation is 97% room air.  CHEST: Showed distant breath sounds. No evidence of wheeze or rhonchi.  CARDIAC: Showed a regular rate and rhythm without S3. Normal S1, S2.  ABDOMEN: Soft, nontender.  EXTREMITIES: Showed no edema or clubbing.  SKIN: Was clear.  NEUROLOGIC: Was intact.  HEENT: Showed no jugular venous distention. No lymphadenopathy.   IMPRESSION:  Is that of recent acute bronchitis.   PLAN:  Is to finish course of Avelox. Return to this office in three  months.     Charlcie Cradle Delford Field, MD, [facility]  Electronically Signed    PEW/MedQ  DD: 12/09/2006  DT: 12/09/2006  Job #: [id number]

## 2010-05-29 NOTE — Op Note (Signed)
NAMEAJAYA, [name]               ACCOUNT NO.:  [id number]   MEDICAL RECORD NO.:  [id number]          PATIENT TYPE:  AMB   LOCATION:  SDC                           FACILITY:  [facility]   PHYSICIAN:  Guy Sandifer. Henderson Cloud, M.D. DATE OF BIRTH:  [birth year]   DATE OF PROCEDURE:  10/21/2007  DATE OF DISCHARGE:                               OPERATIVE REPORT   PREOPERATIVE DIAGNOSIS:  Menometrorrhagia.   POSTOPERATIVE DIAGNOSIS:  Menometrorrhagia.   PROCEDURE:  Hysteroscopy with dilatation and curettage.   SURGEON:  Guy Sandifer. Henderson Cloud, MD   ANESTHESIA:  General with LMA.   SPECIMENS:  Endometrial curettings to pathology, I and O's of sorbitol  distending media 10 mL deficit.   ESTIMATED BLOOD LOSS:  Minimal.   INDICATIONS AND CONSENTS:  The patient is a 50 year old black female  with irregular heavy bleeding.  Hysteroscopy, dilatation and curettage  of possible resectoscope was discussed preoperatively.  Potential risks  and complications have been discussed preoperatively including, but not  limited to infection, uterine perforation, organ damage, bleeding  requiring transfusion of blood products with possible HIV and hepatitis  acquisition, DVT, PE, pneumonia, and recurrent abnormal bleeding.  All  questions have been answered and consent is signed on the chart.   FINDINGS:  The endometrial cavity is without abnormal structure.  Fallopian tube ostia identified bilaterally.   PROCEDURE:  The patient was taken to the operating room where she was  identified, placed in dorsal supine position, and general anesthesia was  induced via LMA.  She was then placed in the dorsal lithotomy position  where she was prepped, bladder straight catheterized, and she was draped  in sterile fashion.  Bivalve speculum was placed in vagina and the  anterior cervical lip was injected with 1% Xylocaine and grasped with  single-tooth tenaculum.  The paracervical block was placed at 2, 4, 5,  7, 8 and 10 o'clock  positions with approximately 20 mL of the same  solution.  The cervix was gently progressively dilated.  Hysteroscope is  placed endocervical canal advanced under direct visualization using  distending media.  The above findings were noted.  Hysteroscope was  withdrawn and sharp curettage was carried out.  Hysteroscope was then  replaced in the endocervical canal again advanced under direct  visualization.  Again the cavity appears to be clean.  Hysteroscope is  withdrawn and procedure is terminated.  All counts were correct.  The  patient was awakened and taken to recovery room in stable condition.     Guy Sandifer Henderson Cloud, M.D.  Electronically Signed    JET/MEDQ  D:  10/21/2007  T:  10/21/2007  Job:  [id number]

## 2010-05-29 NOTE — Assessment & Plan Note (Signed)
[facility]                             PULMONARY OFFICE NOTE   NAME:[name], [name]                      MRN:          [id number]  DATE:09/05/2006                            DOB:          April 22, 1960    [name] is a 50 year old African-American female with a history of  sarcoidosis with eye and lung involvement. She has noted fatigue for  three weeks, increased sleepiness, and she recently had her Effexor  dropped to 37.5 mg daily without much change. Her cough was worse in the  morning. She does not increase post-nasal drainage. She is off of  systemic steroids at this time.   She maintains:  1. Singulair 10 mg daily.  2. Astelin 2 sprays each nostril b.i.d.  3. Nasonex has been off.   PHYSICAL EXAMINATION:  VITAL SIGNS:  Temperature 98, blood pressure  120/80, pulse 78, saturation 95% on room air.  CHEST:  Diminished breath sounds with prolonged expiratory phase. No  wheeze or rhonchi noted.  CARDIAC:  Regular rate and rhythm without S3, normal S1, S2.  ABDOMEN:  Soft and nontender.  EXTREMITIES:  No edema or clubbing.   IMPRESSION:  Chronic rhinitis with increased flare and stable  sarcoidosis.   PLAN:  Obtain chest x-ray. Begin Nasonex 2 sprays b.i.d. to each  nostril. Continue Astelin as prescribed. We will see the patient back in  10 months.     Charlcie Cradle Delford Field, MD, [facility]  Electronically Signed    PEW/MedQ  DD: 09/05/2006  DT: 09/07/2006  Job #: [id number]

## 2010-05-30 ENCOUNTER — Encounter: Payer: Self-pay | Admitting: Internal Medicine

## 2010-06-01 NOTE — Assessment & Plan Note (Signed)
[facility]                             PULMONARY OFFICE NOTE   NAME:Hashimi, CLIO GERHART                      MRN:          [id number]  DATE:01/23/2006                            DOB:          01/27/1960    Ms. Weitzman is a 50 year old [place] American female with history of  sarcoidosis. 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 She has been doing quite well off of  systemic steroids. She does use topical eye drops containing steroid  solution. She is on Singulair 10 mg daily.   PHYSICAL EXAMINATION:  Temperature is 98.0, blood pressure 128/84, pulse  is 72, saturation is 99% on room air.  CHEST: Showed to be clear without evidence of wheeze or rhonchi.  CARDIAC: Showed a regular rate and rhythm without S3. Normal S1, S2.  ABDOMEN: Soft and nontender.  EXTREMITIES: Showed no edema or clubbing.  SKIN: Was clear.  NEUROLOGIC: Was intact.  HEENT: Showed no jugular venous distention, lymphadenopathy. Oropharynx  clear.  NECK: Supple.   IMPRESSION:  Here would be that of sarcoidosis with stable pulmonary  process.   PLAN:  Is for the patient to maintain off systemic steroids. Will see  the patient back in followup in 4 months.     Charlcie Cradle Delford Field, MD, [facility]  Electronically Signed    PEW/MedQ  DD: 01/23/2006  DT: 01/23/2006  Job #: [id number]   cc:   Georgina Quint. Plotnikov, MD

## 2010-06-01 NOTE — Assessment & Plan Note (Signed)
[facility]                               PULMONARY OFFICE NOTE   NAME:Moore, Betty KESHISHYAN                      MRN:          [id number]  DATE:09/25/2005                            DOB:          1960-02-28    [name] returns today in followup.  This is a 50 year old African-  American female, history of sarcoidosis.  She has minimal cough and sneezing  in the morning, otherwise doing well with no dyspnea. She has not been on  prednisone since 2003.  Her medication profile is unchanged.  She is in the  Singulair 10 mg daily.   EXAMINATION:  GENERAL:  This is a middle-aged African-American female in no  distress.  VITAL SIGNS:  Temperature 98.  Blood pressure 120/80.  Pulse 83.  Saturation  98% on room air.  CHEST:  Showed to be clear with distant breath sounds.  CARDIAC:  Showed a regular rate and rhythm without S3.  Normal S1, S2.  ABDOMEN:  Soft, non-tender.  EXTREMITIES:  Showed no edema or clubbing.  SKIN:  Clear.  NEUROLOGIC:  Exam was intact.  HEENT:  Showed no jugular distention, lymphadenopathy, oropharynx clear.  NECK:  Supple.   IMPRESSION:  Sarcoidosis stable at this time.   PLAN:  To obtain chest x-ray today and as well to obtain a full set of  pulmonary function studies.  Once results of these are available, further  recommendation will follow.                                   Charlcie Cradle Delford Field, MD, FCCP   PEW/MedQ  DD:  09/25/2005  DT:  09/26/2005  Job #:  [id number]

## 2010-06-01 NOTE — Assessment & Plan Note (Signed)
[facility]                           GASTROENTEROLOGY OFFICE NOTE   NAME:[name], [name]                      MRN:          [id number]  DATE:11/28/2005                            DOB:          10-Dec-1960    REFERRING PHYSICIAN:  Georgina Quint. Plotnikov, MD   REASON FOR CONSULTATION:  Change in bowel habits.   Ms. Rasmusson is a pleasant 50 year old African-American female referred  through the courtesy of Dr. Posey Rea for evaluation.  Over the past couple  of months she has noted a change in bowel habits consisting of 2-3 loose  stools daily.  She has noted excess flatus.  There has been no change in her  medications or her diet.  She has had some lactose intolerance in the past.  Several weeks ago she had some crampy lower abdominal pain that has  subsided.  She denies melena or hematochezia.   PAST MEDICAL HISTORY:  Pertinent for hypertension and sleep apnea.  She has  sarcoidosis.  She has glaucoma.   FAMILY HISTORY:  Pertinent for a mother who died from lung cancer.  She also  had sarcoidosis.  Father had a CVA.   MEDICATIONS:  Singulair, bilberry, lutein, Lotemax, Norvasc, Fosamax,  Atacand, Valtrex, Cosopt, Alphagan, and Effexor.   ALLERGIES:  SULFA.   She neither smokes nor drinks.  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   REVIEW OF SYSTEMS:  Positive for frequent cough at night, excessive fatigue  and some dyspnea on exertion.   PHYSICAL EXAMINATION:  VITAL SIGNS:  Pulse 76, blood pressure 100/70, weight  142.  HEENT:  EOMI. PERRLA. Sclerae are anicteric.  Conjunctivae are pink.  NECK:  Supple without thyromegaly, adenopathy or carotid bruits.  CHEST:  Clear. To auscultation and percussion without adventitious sounds.  CARDIAC:  Regular rhythm; normal S1 S2.  There are no murmurs, gallops or  rubs.  ABDOMEN:  Bowel sounds are normoactive.  Abdomen is soft, non-tender and non-  distended.  There are no abdominal masses,  tenderness, splenic enlargement  or hepatomegaly.  EXTREMITIES:  Full range of motion.  No cyanosis, clubbing or edema.  RECTAL:  Deferred.   IMPRESSION:  1. Mild change in bowel habits with excess flatus.  This could be due to a      subtle change in diet and she is unaware of this.  Structural lesion of      the colon is less likely though ought to be ruled out.  2. Sarcoidosis.  3. Probable lactose intolerance.   RECOMMENDATION:  Colonoscopy.     Barbette Hair. Arlyce Dice, MD,FACG  Electronically Signed    RDK/MedQ  DD: 11/28/2005  DT: 11/28/2005  Job #: [id number]

## 2010-06-01 NOTE — Assessment & Plan Note (Signed)
[facility]                                 ON-CALL NOTE   NAME:[name], [name]                        MRN:          [id number]  DATE:04/15/2007                            DOB:          12-Jan-1961    TELEPHONE NUMBER:  [phone]   PRIMARY CARE PHYSICIAN:  Dr. Posey Rea.   The patient has been calling for two days for a refill on her potassium  whose dosing has recently been increased because she says it has been  low, when she saw the doctor last week. The pharmacy has faxed in the  request and she had called the triage nurse yesterday, but she has not  had any refills sent in on this. We will refill her K-Lite generic, 25  mEq, 1 p.o. b.i.d., number of 60. Other refills should come from her  primary doctor. I called it in to [facility] on [address], phone [phone].     Neta Mends. Panosh, MD  Electronically Signed    WKP/MedQ  DD: 04/15/2007  DT: 04/16/2007  Job #: [id number]

## 2010-08-02 ENCOUNTER — Other Ambulatory Visit: Payer: Self-pay | Admitting: *Deleted

## 2010-08-02 DIAGNOSIS — I10 Essential (primary) hypertension: Secondary | ICD-10-CM

## 2010-08-06 ENCOUNTER — Ambulatory Visit: Payer: 59 | Admitting: Internal Medicine

## 2010-08-30 ENCOUNTER — Other Ambulatory Visit (INDEPENDENT_AMBULATORY_CARE_PROVIDER_SITE_OTHER): Payer: 59

## 2010-08-30 DIAGNOSIS — I10 Essential (primary) hypertension: Secondary | ICD-10-CM

## 2010-08-30 LAB — BASIC METABOLIC PANEL
BUN: 18 mg/dL (ref 6–23)
CO2: 29 mEq/L (ref 19–32)
GFR: 78.77 mL/min (ref >60.00)
Glucose, Bld: 94 mg/dL (ref 70–99)
Potassium: 3.6 mEq/L (ref 3.5–5.1)

## 2010-09-04 ENCOUNTER — Ambulatory Visit: Payer: 59 | Admitting: Internal Medicine

## 2010-09-04 ENCOUNTER — Other Ambulatory Visit (INDEPENDENT_AMBULATORY_CARE_PROVIDER_SITE_OTHER): Payer: 59

## 2010-09-04 ENCOUNTER — Ambulatory Visit (INDEPENDENT_AMBULATORY_CARE_PROVIDER_SITE_OTHER): Payer: 59 | Admitting: Internal Medicine

## 2010-09-04 ENCOUNTER — Telehealth: Payer: Self-pay | Admitting: Internal Medicine

## 2010-09-04 ENCOUNTER — Encounter: Payer: Self-pay | Admitting: Internal Medicine

## 2010-09-04 DIAGNOSIS — R5383 Other fatigue: Secondary | ICD-10-CM

## 2010-09-04 DIAGNOSIS — R5381 Other malaise: Secondary | ICD-10-CM

## 2010-09-04 DIAGNOSIS — F329 Major depressive disorder, single episode, unspecified: Secondary | ICD-10-CM

## 2010-09-04 DIAGNOSIS — J45909 Unspecified asthma, uncomplicated: Secondary | ICD-10-CM

## 2010-09-04 DIAGNOSIS — I10 Essential (primary) hypertension: Secondary | ICD-10-CM

## 2010-09-04 DIAGNOSIS — M1990 Unspecified osteoarthritis, unspecified site: Secondary | ICD-10-CM

## 2010-09-04 LAB — BASIC METABOLIC PANEL
BUN: 17 mg/dL (ref 6–23)
CO2: 34 mEq/L — ABNORMAL HIGH (ref 19–32)
Calcium: 10.2 mg/dL (ref 8.4–10.5)
Chloride: 103 mEq/L (ref 96–112)
Creatinine, Ser: 0.9 mg/dL (ref 0.4–1.2)

## 2010-09-04 LAB — URINALYSIS
Bilirubin Urine: NEGATIVE
Hgb urine dipstick: NEGATIVE
Ketones, ur: NEGATIVE
Total Protein, Urine: NEGATIVE
pH: 8 (ref 5.0–8.0)

## 2010-09-04 LAB — CBC WITH DIFFERENTIAL/PLATELET
Basophils Relative: 0.7 % (ref 0.0–3.0)
Eosinophils Absolute: 0.2 10*3/uL (ref 0.0–0.7)
Eosinophils Relative: 2.4 % (ref 0.0–5.0)
HCT: 40.3 % (ref 36.0–46.0)
Lymphs Abs: 2 10*3/uL (ref 0.7–4.0)
MCHC: 33.3 g/dL (ref 30.0–36.0)
MCV: 94.6 fl (ref 78.0–100.0)
Monocytes Absolute: 0.8 10*3/uL (ref 0.1–1.0)
Neutrophils Relative %: 56.8 % (ref 43.0–77.0)
Platelets: 327 10*3/uL (ref 150.0–400.0)
WBC: 7.1 10*3/uL (ref 4.5–10.5)

## 2010-09-04 LAB — MAGNESIUM: Magnesium: 2 mg/dL (ref 1.5–2.5)

## 2010-09-04 NOTE — Assessment & Plan Note (Signed)
On RX prn

## 2010-09-04 NOTE — Assessment & Plan Note (Addendum)
It relapsed 2 d ago: check CBC. CBG 150 postprand Check CBGs at home and record sx

## 2010-09-04 NOTE — Telephone Encounter (Signed)
Stacey , please, inform the patient: labs are OK   Please, keep  next office visit appointment.   Thank you !   

## 2010-09-04 NOTE — Assessment & Plan Note (Signed)
On Rx 

## 2010-09-04 NOTE — Progress Notes (Signed)
  Subjective:    Patient ID: Erasmo Score, female    DOB: 03-27-1960, 50 y.o.   MRN: [id number]  HPI   The patient presents for a follow-up of  chronic hypertension, chronic dyslipidemia, sarcoid controlled with medicines C/o feeling bad on Sun-Mon w/heavy legs. She [place] steak [place] fish [place] on [place] - no GI upset.   Review of Systems  Constitutional: Positive for fatigue. Negative for chills, activity change, appetite change and unexpected weight change.  HENT: Negative for congestion, mouth sores and sinus pressure.   Eyes: Negative for visual disturbance.  Respiratory: Negative for cough and chest tightness.   Gastrointestinal: Negative for nausea and abdominal pain.  Genitourinary: Negative for frequency, difficulty urinating and vaginal pain.  Musculoskeletal: Positive for arthralgias. Negative for back pain and gait problem.  Skin: Negative for pallor and rash.  Neurological: Negative for dizziness, tremors, weakness, numbness and headaches.  Psychiatric/Behavioral: Negative for confusion and sleep disturbance.       Objective:   Physical Exam  Constitutional: She appears well-developed and well-nourished. No distress.  HENT:  Head: Normocephalic.  Right Ear: External ear normal.  Left Ear: External ear normal.  Nose: Nose normal.  Mouth/Throat: Oropharynx is clear and moist.  Eyes: Conjunctivae are normal. Pupils are equal, round, and reactive to light. Right eye exhibits no discharge. Left eye exhibits no discharge.  Neck: Normal range of motion. Neck supple. No JVD present. No tracheal deviation present. No thyromegaly present.  Cardiovascular: Normal rate, regular rhythm and normal heart sounds.   Pulmonary/Chest: No stridor. No respiratory distress. She has no wheezes.  Abdominal: Soft. Bowel sounds are normal. She exhibits no distension and no mass. There is no tenderness. There is no rebound and no guarding.  Musculoskeletal: She exhibits no edema and no  tenderness.  Lymphadenopathy:    She has no cervical adenopathy.  Neurological: She displays normal reflexes. No cranial nerve deficit. She exhibits normal muscle tone. Coordination normal.  Skin: No rash noted. No erythema.  Psychiatric: Her behavior is normal. Judgment and thought content normal.     Lab Results  Component Value Date   WBC 5.4 01/31/2010   HGB 14.3 01/31/2010   HCT 41.5 01/31/2010   PLT 314.0 01/31/2010   CHOL 158 01/31/2010   TRIG 87.0 01/31/2010   HDL 38.50* 01/31/2010   ALT 37* 01/31/2010   AST 31 01/31/2010   NA 140 08/30/2010   K 3.6 08/30/2010   CL 103 08/30/2010   CREATININE 1.0 08/30/2010   BUN 18 08/30/2010   CO2 29 08/30/2010   TSH 2.37 01/31/2010   HGBA1C 5.9 11/21/2008        Assessment & Plan:

## 2010-09-05 NOTE — Telephone Encounter (Signed)
Left detailed mess informing pt of below.  

## 2010-09-10 ENCOUNTER — Ambulatory Visit: Payer: 59 | Admitting: Critical Care Medicine

## 2010-09-11 ENCOUNTER — Telehealth: Payer: Self-pay | Admitting: *Deleted

## 2010-09-11 NOTE — Telephone Encounter (Signed)
Med list updated, Patient informed

## 2010-09-11 NOTE — Telephone Encounter (Signed)
Take Bystolic 1/2 tab a day Thx

## 2010-09-11 NOTE — Telephone Encounter (Signed)
Patient requesting advisement. She has been exercising - fasting cbg this am was 104 and BP 110/74. Pt wants to know if she could decrease or stop any BP meds?

## 2010-09-11 NOTE — Telephone Encounter (Signed)
Pt is already taking 1/2 of 20 mg tab daily. She is currently also taking 5 mg amlodipine and 100 mg of losartan. She would like to try 1/2 tab of amlodipine.

## 2010-09-11 NOTE — Telephone Encounter (Signed)
Agree. Thx 

## 2010-09-12 ENCOUNTER — Ambulatory Visit (INDEPENDENT_AMBULATORY_CARE_PROVIDER_SITE_OTHER): Payer: 59 | Admitting: Critical Care Medicine

## 2010-09-12 ENCOUNTER — Encounter: Payer: Self-pay | Admitting: Critical Care Medicine

## 2010-09-12 VITALS — BP 124/86 | HR 70 | Temp 98.0°F | Ht 60.0 in | Wt 148.8 lb

## 2010-09-12 DIAGNOSIS — J45909 Unspecified asthma, uncomplicated: Secondary | ICD-10-CM

## 2010-09-12 DIAGNOSIS — G4710 Hypersomnia, unspecified: Secondary | ICD-10-CM

## 2010-09-12 NOTE — Patient Instructions (Signed)
No change in medications. A Home Sleep study will be obtained, I will call with results Return in        4 months

## 2010-09-12 NOTE — Progress Notes (Signed)
Subjective:    Patient ID: [name], female    DOB: October 27, 1960, 50 y.o.   MRN: [id number]  HPI  50 y.o. AAF  female with a history of sarcoidosis.   05/04/10 Since last ov, no major issues. Here for F/u.  Does exercise before work, now BP is better.  No real cough.  Notes more allergy issues. Pt denies any significant sore throat, notes nasal congestion but no  excess secretions, fever, chills, sweats, unintended weight loss, pleurtic or exertional chest pain, orthopnea PND, or leg swelling Pt denies any increase in rescue therapy over baseline, denies waking up needing it or having any early am or nocturnal exacerbations of coughing/wheezing/or dyspnea. Pt also denies any obvious fluctuation in symptoms with  weather or environmental change or other alleviating or aggravating factors  09/12/2010 Saw PCP 8/21: BS high, since then doing better. Had fatigue.  Pts dyspnea levels the same.  Now no cough or mucus.  No real chest pain. No wheeze.  Sl sinus issue, better with rinse.   Asthma History     09/12/10 1018         Asthma History:   Symptoms  0-2 days/week         Nighttime Awakenings  0-2/month         Asthma interference with normal activity  No limitations         SABA use (not for EIB)  0-2 days/wk         Risk: Exacerbations requiring oral systemic steroids  0-1 / year            Review of Systems  Constitutional:   No  weight loss, night sweats,  Fevers, chills, fatigue, lassitude. HEENT:   No headaches,  Difficulty swallowing,  Tooth/dental problems,  Sore throat,                Notes  Sneezing, no  itching, ear ache, notes nasal congestion and  post nasal drip,   CV:  No chest pain,  Orthopnea, PND, swelling in lower extremities, anasarca, dizziness, palpitations  GI  No heartburn, indigestion, abdominal pain, nausea, vomiting, diarrhea, change in bowel habits, loss of appetite  Resp: No shortness of breath with exertion or at rest.  No excess mucus, no  productive cough,  No non-productive cough,  No coughing up of blood.  No change in color of mucus.  No wheezing.  No chest wall deformity  Skin: no rash or lesions.  GU: no dysuria, change in color of urine, no urgency or frequency.  No flank pain.  MS:  No joint pain or swelling.  No decreased range of motion.  No back pain.  Psych:  No change in mood or affect. No depression or anxiety.  No memory loss.     Objective:   Physical Exam  Gen: Pleasant, well-nourished, in no distress,  normal affect  ENT: No lesions,  mouth clear,  oropharynx clear, no postnasal drip  Neck: No JVD, no TMG, no carotid bruits  Lungs: No use of accessory muscles, no dullness to percussion, clear without rales or rhonchi  Cardiovascular: RRR, heart sounds normal, no murmur or gallops, no peripheral edema  Abdomen: soft and NT, no HSM,  BS normal  Musculoskeletal: No deformities, no cyanosis or clubbing  Neuro: alert, non focal  Skin: Warm, no lesions or rashes        Assessment & Plan:   ASTHMA Chronic persistent asthma  Stable  Plan No change in  inhaled or maintenance medications. Return in   4 months  Hypersomnia Symptom complex c/w OSA Plan  Home sleep study    Updated Medication List Outpatient Encounter Prescriptions as of 09/12/2010  Medication Sig Dispense Refill  . acyclovir (ZOVIRAX) 5 % cream Apply topically as needed.        [name] amLODipine (NORVASC) 5 MG tablet Take 2.5 mg by mouth daily.        . Dorzolamide HCl-Timolol Mal (COSOPT OP) Apply to eye. 1 drop two times a day in left eye       . fluconazole (DIFLUCAN) 150 MG tablet Take 150 mg by mouth as needed.       . fluticasone (VERAMYST) 27.5 MCG/SPRAY nasal spray 2 sprays by Nasal route daily. As needed      . Hypertonic Nasal Wash (NASAFLO NETI POT NASAL WASH NA) by Nasal route.        [name] levocetirizine (XYZAL) 5 MG tablet Take 1 tablet (5 mg total) by mouth every evening.  30 tablet  11  . losartan (COZAAR) 100  MG tablet Take 100 mg by mouth daily.        . Multiple Vitamins-Minerals (HAIR/SKIN/NAILS) TABS Take by mouth daily.        . Nebivolol HCl (BYSTOLIC) 20 MG TABS Take 0.5 tablets by mouth daily.       . Omega-3 Fatty Acids (FISH OIL) 1000 MG CAPS Take by mouth 2 (two) times daily.        . potassium bicarbonate (K-VESCENT) 25 MEQ disintegrating tablet Take 25 mEq by mouth 2 (two) times daily.        . valACYclovir (VALTREX) 1000 MG tablet Take 1,000 mg by mouth 2 (two) times daily. 1/2 tablet two times a day       . DISCONTD: esomeprazole (NEXIUM) 40 MG capsule Take 40 mg by mouth daily before breakfast.        . DISCONTD: Hydrocodone-Acetaminophen 10-660 MG TABS Take 1 tablet by mouth 4 (four) times daily as needed (pain).  40 each  0  . DISCONTD: indomethacin (INDOCIN) 50 MG capsule Take 50 mg by mouth 3 (three) times daily with meals.        [name] DISCONTD: PRED FORTE 1 % ophthalmic suspension Place 1 drop into the left eye Twice daily.      [name] DISCONTD: Salicylic Acid (SALEX) 6 % LOTN Apply 1 application topically 1 dose over 46 hours.  335 mL  0  . DISCONTD: triamcinolone (KENALOG) 0.1 % cream Apply topically 2 (two) times daily.

## 2010-09-13 NOTE — Assessment & Plan Note (Signed)
Chronic persistent asthma  Stable  Plan No change in inhaled or maintenance medications. Return in   4 months

## 2010-09-13 NOTE — Assessment & Plan Note (Signed)
Symptom complex c/w OSA Plan  Home sleep study

## 2010-09-18 ENCOUNTER — Telehealth: Payer: Self-pay | Admitting: Critical Care Medicine

## 2010-09-18 NOTE — Telephone Encounter (Signed)
Pt will  to come get [facility] Thursday 09/20/10

## 2010-09-24 ENCOUNTER — Ambulatory Visit (INDEPENDENT_AMBULATORY_CARE_PROVIDER_SITE_OTHER): Payer: 59 | Admitting: Internal Medicine

## 2010-09-24 DIAGNOSIS — G4710 Hypersomnia, unspecified: Secondary | ICD-10-CM

## 2010-09-24 DIAGNOSIS — G4733 Obstructive sleep apnea (adult) (pediatric): Secondary | ICD-10-CM

## 2010-09-24 NOTE — Telephone Encounter (Signed)
Per libby this has already been taken care of

## 2010-09-26 ENCOUNTER — Telehealth: Payer: Self-pay | Admitting: Critical Care Medicine

## 2010-09-26 DIAGNOSIS — G4733 Obstructive sleep apnea (adult) (pediatric): Secondary | ICD-10-CM

## 2010-09-26 NOTE — Telephone Encounter (Signed)
Sleep study pos sleep apnea Pt needs sleep med eval

## 2010-09-27 NOTE — Telephone Encounter (Signed)
Spoke to pt she is  aware she needs to see dr young if the appt is far off see dr Vassie Loll transfer to [facility] who will set up this consult [name]

## 2010-10-15 LAB — COMPREHENSIVE METABOLIC PANEL
ALT: 22
AST: 25
CO2: 28
Calcium: 9.3
Chloride: 101
GFR calc non Af Amer: >60
Glucose, Bld: 91
Sodium: 137
Total Bilirubin: 0.2 — ABNORMAL LOW

## 2010-10-15 LAB — HCG, SERUM, QUALITATIVE: Preg, Serum: NEGATIVE

## 2010-10-15 LAB — CBC
Hemoglobin: 12.7
MCHC: 34
MCV: 92.7
RBC: 4.04
WBC: 7

## 2010-11-09 ENCOUNTER — Ambulatory Visit (INDEPENDENT_AMBULATORY_CARE_PROVIDER_SITE_OTHER): Payer: 59 | Admitting: Internal Medicine

## 2010-11-09 ENCOUNTER — Encounter: Payer: Self-pay | Admitting: Internal Medicine

## 2010-11-09 VITALS — BP 128/86 | HR 86 | Ht 60.0 in | Wt 149.0 lb

## 2010-11-09 DIAGNOSIS — J309 Allergic rhinitis, unspecified: Secondary | ICD-10-CM

## 2010-11-09 DIAGNOSIS — G4733 Obstructive sleep apnea (adult) (pediatric): Secondary | ICD-10-CM

## 2010-11-09 MED ORDER — GLUCOSE BLOOD VI STRP: ORAL_STRIP | Status: AC

## 2010-11-09 NOTE — Patient Instructions (Signed)
Instead of your xyzal antihistamine, which can cause drowsiness- try otc antihistamine fexofenadine/ Allegra 180    1 daily  Add sample Nasonex nasal steroid spray   1-2 puffs in each nostril once daily at bedtime  We can talk more about your sleep study when you return, and whether to try using a CPAP device   Courtesy refill for glucose test strips

## 2010-11-09 NOTE — Progress Notes (Signed)
11/09/10- 50 year old female followed in the past for sarcoid and allergic rhinitis complicated by glaucoma, HBP, asthma. Dr. Delford Field obtained history the she stays tired. She sleeps alone but is told she snores. A remote nocturnal polysomnogram have been negative. Now, an unattended home sleep study "[name]" on 09/19/2010 documented mild obstructive apnea, AHI 9.8 per hour with mean oxygen saturation of 90%, minimum of 75% on room air and significant snoring. Bedtime is between 9 and 11 PM, sleep latency 10 minutes, waking once or twice before up in the morning. She sets 2 alarm clocks, 1 across the room. Has cut back on caffeine. Naps do help. Denies history of heart disease, ENT surgery or thyroid disease. Uses saline lavage occasionally for stuffy nose, Xyzal for allergy. Declines flu vaccine. Never smoked. 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  ROS-see HPI Constitutional:   No-   weight loss, night sweats, fevers, chills, fatigue, lassitude. HEENT:   No-  headaches, difficulty swallowing, tooth/dental problems, sore throat,       No-  sneezing, itching, ear ache, +nasal congestion, post nasal drip,  CV:  No-   chest pain, orthopnea, PND, swelling in lower extremities, anasarca, dizziness, palpitations Resp: No- acute  shortness of breath with exertion or at rest.              No-   productive cough,  No non-productive cough,  No- coughing up of blood.              No-   change in color of mucus.  No- wheezing.   Skin: No-   rash or lesions. GI:  No-   heartburn, indigestion, abdominal pain, nausea, vomiting, diarrhea,                 change in bowel habits, loss of appetite GU: No-   dysuria, change in color of urine, no urgency or frequency.  No- flank pain. MS:  No-   joint pain or swelling.  No- decreased range of motion.  No- back pain. Neuro-     nothing unusual Psych:  No- change in mood or affect. No depression or anxiety.  No memory loss.  OBJ General-  Alert, Oriented, Affect-appropriate, Distress- none acute, medium build Skin- rash-none, lesions- none, excoriation- none Lymphadenopathy- none Head- atraumatic            Eyes- Gross vision intact, PERRLA, conjunctivae clear secretions. Periorbital edema            Ears- Hearing, canals-normal            Nose- Mucus bridging, no-Septal dev,  polyps, erosion, perforation             Throat- Mallampati II-III , mucosa clear , drainage- none, tonsils- atrophic Neck- flexible , trachea midline, no stridor , thyroid nl, carotid no bruit Chest - symmetrical excursion , unlabored           Heart/CV- RRR , no murmur , no gallop  , no rub, nl s1 s2                           - JVD- none , edema- none, stasis changes- none, varices- none           Lung- clear to P&A, wheeze- none, cough- none , dullness-none, rub- none           Chest wall-  Abd- tender-no, distended-no, bowel sounds-present, HSM- no Br/  Gen/ Rectal- Not done, not indicated Extrem- cyanosis- none, clubbing, none, atrophy- none, strength- nl Neuro- grossly intact to observation

## 2010-11-11 DIAGNOSIS — G4733 Obstructive sleep apnea (adult) (pediatric): Secondary | ICD-10-CM | POA: Insufficient documentation

## 2010-11-11 NOTE — Assessment & Plan Note (Signed)
She is going to add Nasonex while continuing use of Allegra when needed.

## 2010-11-11 NOTE — Assessment & Plan Note (Signed)
We spent time discussing the medical significance, test results, physiology and available treatments. She wants to wait on CPAP until she tries more regular therapy for rhinitis, and weight loss. This is reasonable.

## 2010-11-12 ENCOUNTER — Other Ambulatory Visit: Payer: Self-pay | Admitting: Internal Medicine

## 2010-11-13 ENCOUNTER — Encounter: Payer: Self-pay | Admitting: Internal Medicine

## 2010-12-05 ENCOUNTER — Encounter: Payer: Self-pay | Admitting: Internal Medicine

## 2010-12-05 ENCOUNTER — Ambulatory Visit (INDEPENDENT_AMBULATORY_CARE_PROVIDER_SITE_OTHER): Payer: 59 | Admitting: Internal Medicine

## 2010-12-05 DIAGNOSIS — I10 Essential (primary) hypertension: Secondary | ICD-10-CM

## 2010-12-05 DIAGNOSIS — J309 Allergic rhinitis, unspecified: Secondary | ICD-10-CM

## 2010-12-05 DIAGNOSIS — J45909 Unspecified asthma, uncomplicated: Secondary | ICD-10-CM

## 2010-12-05 DIAGNOSIS — F329 Major depressive disorder, single episode, unspecified: Secondary | ICD-10-CM

## 2010-12-05 DIAGNOSIS — F411 Generalized anxiety disorder: Secondary | ICD-10-CM

## 2010-12-05 MED ORDER — FEXOFENADINE HCL 180 MG PO TABS: 180.0000 mg | ORAL_TABLET | Freq: Every day | ORAL | Status: DC

## 2010-12-05 NOTE — Progress Notes (Signed)
  Subjective:    Patient ID: [name], female    DOB: 15-Jul-1960, 50 y.o.   MRN: [id number]  HPI  The patient presents for a follow-up of  chronic hypertension, chronic dyslipidemia, sarcoid, allergies  controlled with medicines    Review of Systems  Constitutional: Negative for chills, activity change, appetite change, fatigue and unexpected weight change.  HENT: Negative for congestion, mouth sores and sinus pressure.   Eyes: Negative for visual disturbance.  Respiratory: Negative for cough and chest tightness.   Gastrointestinal: Negative for nausea and abdominal pain.  Genitourinary: Negative for frequency, difficulty urinating and vaginal pain.  Musculoskeletal: Negative for back pain and gait problem.  Skin: Negative for pallor and rash.  Neurological: Negative for dizziness, tremors, weakness, numbness and headaches.  Psychiatric/Behavioral: Negative for confusion and sleep disturbance.   Wt Readings from Last 3 Encounters:  12/05/10 147 lb (66.679 kg)  11/09/10 149 lb (67.586 kg)  09/12/10 148 lb 12.8 oz (67.495 kg)       Objective:   Physical Exam  Constitutional: She appears well-developed and well-nourished. No distress.  HENT:  Head: Normocephalic.  Right Ear: External ear normal.  Left Ear: External ear normal.  Nose: Nose normal.  Mouth/Throat: Oropharynx is clear and moist.  Eyes: Conjunctivae are normal. Pupils are equal, round, and reactive to light. Right eye exhibits no discharge. Left eye exhibits no discharge.  Neck: Normal range of motion. Neck supple. No JVD present. No tracheal deviation present. No thyromegaly present.  Cardiovascular: Normal rate, regular rhythm and normal heart sounds.   Pulmonary/Chest: No stridor. No respiratory distress. She has no wheezes.  Abdominal: Soft. Bowel sounds are normal. She exhibits no distension and no mass. There is no tenderness. There is no rebound and no guarding.  Musculoskeletal: She exhibits no edema  and no tenderness.  Lymphadenopathy:    She has no cervical adenopathy.  Neurological: She displays normal reflexes. No cranial nerve deficit. She exhibits normal muscle tone. Coordination normal.  Skin: No rash noted. No erythema.  Psychiatric: She has a normal mood and affect. Her behavior is normal. Judgment and thought content normal.          Assessment & Plan:

## 2010-12-05 NOTE — Assessment & Plan Note (Signed)
Continue with current prescription therapy as reflected on the Med list.  

## 2010-12-05 NOTE — Assessment & Plan Note (Signed)
Not on meds now - better

## 2010-12-05 NOTE — Assessment & Plan Note (Signed)
On allegra - doing well

## 2010-12-11 ENCOUNTER — Telehealth: Payer: Self-pay | Admitting: Internal Medicine

## 2010-12-11 DIAGNOSIS — Z0000 Encounter for general adult medical examination without abnormal findings: Secondary | ICD-10-CM

## 2010-12-11 NOTE — Telephone Encounter (Signed)
Labs entered.

## 2010-12-11 NOTE — Telephone Encounter (Signed)
Message copied by Virgina Evener on Tue Dec 11, 2010  4:21 PM ------      Message from: COUSIN, SHARON T      Created: Wed Dec 05, 2010  8:57 AM      Regarding: PHY DATE:  03/08/11       THANKS

## 2010-12-14 ENCOUNTER — Ambulatory Visit (INDEPENDENT_AMBULATORY_CARE_PROVIDER_SITE_OTHER): Payer: 59 | Admitting: Internal Medicine

## 2010-12-14 ENCOUNTER — Encounter: Payer: Self-pay | Admitting: Internal Medicine

## 2010-12-14 VITALS — BP 110/68 | HR 92 | Ht 60.0 in | Wt 148.6 lb

## 2010-12-14 DIAGNOSIS — G4733 Obstructive sleep apnea (adult) (pediatric): Secondary | ICD-10-CM

## 2010-12-14 DIAGNOSIS — J309 Allergic rhinitis, unspecified: Secondary | ICD-10-CM

## 2010-12-14 MED ORDER — FLUTICASONE PROPIONATE 50 MCG/ACT NA SUSP: 2.0000 | Freq: Every day | NASAL | Status: DC

## 2010-12-14 NOTE — Patient Instructions (Signed)
Order- schedule NPSG split protocol for dx OSA  Ok to take Sudafed decongestant occasionally as needed for nasal congestion  Continue the saline nasal rinses.

## 2010-12-14 NOTE — Progress Notes (Signed)
11/09/10- 50 year old female followed in the past for sarcoid and allergic rhinitis complicated by glaucoma, HBP, asthma. Dr. Delford Field obtained history the she stays tired. She sleeps alone but is told she snores. A remote nocturnal polysomnogram have been negative. Now, an unattended home sleep study "[name]" on 09/19/2010 documented mild obstructive apnea, AHI 9.8 per hour with mean oxygen saturation of 90%, minimum of 75% on room air and significant snoring. Bedtime is between 9 and 11 PM, sleep latency 10 minutes, waking once or twice before up in the morning. She sets 2 alarm clocks, 1 across the room. Has cut back on caffeine. Naps do help. Denies history of heart disease, ENT surgery or thyroid disease. Uses saline lavage occasionally for stuffy nose, Xyzal for allergy. Declines flu vaccine. Never smoked. 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  12/14/10- 50 year old female followed in the past for sarcoid and allergic rhinitis complicated by glaucoma, HBP, asthma. OSA She declines flu vaccine after discussion. Gives history of an intense systemic reaction to her first one. She had had a home sleep study showing mild obstructive sleep apnea, 9.8 per hour as detailed last visit. She wanted to wait on CPAP. She has decided now that she wants a more sensitive and detailed overnight sleep Center attended sleep study before making a treatment decision. We talked again about conservative measures such as sleeping off flat of back and keeping weight down. Rhinitis has been better, using Allegra and Nasonex. For the past 2 days she has had increased nasal congestion and frontal sinus pressure. She has begun using a saline nasal rinse.  ROS-see HPI Constitutional:   No-   weight loss, night sweats, fevers, chills, fatigue, lassitude. HEENT:   +  headaches, No-difficulty swallowing, tooth/dental problems, sore throat,       No-  sneezing, itching, ear ache, +nasal congestion, post  nasal drip,  CV:  No-   chest pain, orthopnea, PND, swelling in lower extremities, anasarca, dizziness, palpitations Resp: No- acute  shortness of breath with exertion or at rest.              No-   productive cough,  No non-productive cough,  No- coughing up of blood.              N.o-   change in color of mucus.  No- wheezing.   Skin: No-   rash or lesions. GI:  No-   heartburn, indigestion, abdominal pain, nausea, vomiting, diarrhea,                 change in bowel habits, loss of appetite GU: No-   dysuria, change in color of urine, no urgency or frequency.  No- flank pain. MS:  No-   joint pain or swelling.  No- decreased range of motion.  No- back pain. Neuro-     nothing unusual Psych:  No- change in mood or affect. No depression or anxiety.  No memory loss.  OBJ General- Alert, Oriented, Affect-appropriate, Distress- none acute, medium build Skin- rash-none, lesions- none, excoriation- none Lymphadenopathy- none Head- atraumatic            Eyes- Gross vision intact, PERRLA, conjunctivae clear secretions. Mild periorbital edema            Ears- Hearing, canals-normal            Nose- Mucus bridging, no-Septal dev,  polyps, erosion, perforation             Throat- Mallampati  II-III , mucosa clear , drainage- none, tonsils- atrophic Neck- flexible , trachea midline, no stridor , thyroid nl, carotid no bruit Chest - symmetrical excursion , unlabored           Heart/CV- RRR , no murmur , no gallop  , no rub, nl s1 s2                           - JVD- none , edema- none, stasis changes- none, varices- none           Lung- clear to P&A, wheeze- none, cough- none , dullness-none, rub- none           Chest wall-  Abd- tender-no, distended-no, bowel sounds-present, HSM- no Br/ Gen/ Rectal- Not done, not indicated Extrem- cyanosis- none, clubbing, none, atrophy- none, strength- nl Neuro- grossly intact to observation

## 2010-12-18 NOTE — Assessment & Plan Note (Signed)
She has been better controlled using Allegra and Nasonex. She wants to change back to her preferred maintenance fluticasone.

## 2010-12-18 NOTE — Assessment & Plan Note (Signed)
We had a good discussion of sleep apnea and appropriate therapies for mild disease. She is worried about it all and not satisfied that this is the best assessment and available. She has asked me to schedule a formal attended study. She wants to defer a decision about CPAP until that result is available. Meanwhile she understands to try to keep her weight down and to continue treatment for nasal congestion.

## 2010-12-27 ENCOUNTER — Telehealth: Payer: Self-pay | Admitting: Gastroenterology

## 2010-12-27 ENCOUNTER — Encounter: Payer: Self-pay | Admitting: Nurse Practitioner

## 2010-12-27 NOTE — Telephone Encounter (Signed)
Pt scheduled to see Willette Cluster NP 12/28/10@2 :30pm for abdominal pain. Pt states she has had pain on her left side for about a week and a half. Pt aware of appt date and time.

## 2010-12-27 NOTE — Telephone Encounter (Signed)
Left message for pt to call back  °

## 2010-12-28 ENCOUNTER — Ambulatory Visit (INDEPENDENT_AMBULATORY_CARE_PROVIDER_SITE_OTHER): Payer: 59 | Admitting: Nurse Practitioner

## 2010-12-28 ENCOUNTER — Other Ambulatory Visit (INDEPENDENT_AMBULATORY_CARE_PROVIDER_SITE_OTHER): Payer: 59

## 2010-12-28 ENCOUNTER — Encounter: Payer: Self-pay | Admitting: Nurse Practitioner

## 2010-12-28 VITALS — BP 110/72 | HR 68 | Ht 60.0 in | Wt 148.0 lb

## 2010-12-28 DIAGNOSIS — R1032 Left lower quadrant pain: Secondary | ICD-10-CM

## 2010-12-28 LAB — CBC WITH DIFFERENTIAL/PLATELET
Basophils Absolute: 0.1 10*3/uL (ref 0.0–0.1)
Basophils Relative: 0.9 % (ref 0.0–3.0)
Eosinophils Absolute: 0.2 10*3/uL (ref 0.0–0.7)
Lymphocytes Relative: 33.9 % (ref 12.0–46.0)
MCHC: 34.4 g/dL (ref 30.0–36.0)
MCV: 92.8 fl (ref 78.0–100.0)
Monocytes Absolute: 0.8 10*3/uL (ref 0.1–1.0)
Neutrophils Relative %: 51.2 % (ref 43.0–77.0)
Platelets: 311 10*3/uL (ref 150.0–400.0)
RBC: 4.13 Mil/uL (ref 3.87–5.11)
RDW: 14.2 % (ref 11.5–14.6)

## 2010-12-28 LAB — URINALYSIS
Bilirubin Urine: NEGATIVE
Hgb urine dipstick: NEGATIVE
Ketones, ur: NEGATIVE
Leukocytes, UA: NEGATIVE
Specific Gravity, Urine: 1.01 (ref 1.000–1.030)
Urine Glucose: NEGATIVE
Urobilinogen, UA: 0.2 (ref 0.0–1.0)

## 2010-12-28 NOTE — Progress Notes (Signed)
12/28/2010 LYNDALL BELLOT [id number] 1960-05-22   HISTORY OF PRESENT ILLNESS: Ms. Conley is a 50 year old female who underwent colonoscopy by Dr. Arlyce Dice in December 2007 for evaluation of bowel changes. The examination was completely normal.  She was last seen in 2009 for loose stools felt to be related to antibiotics. She comes in today with a one-week history of mild left lower quadrant discomfort associated with increased intestinal gas. Symptoms most prominent in the mornings and at night. The discomfort is about a 1 out of 10 on pain scale. She describes discomfort and sensation that something is "moving through or around " LLQ. Stools overall okay except slightly lighter in color and of smaller caliber lately.  No unusual vaginal discharge, no urinary symptoms  Past Medical History  Diagnosis Date  . Osteoporosis   . Hypertension   . Depression   . Glaucoma   . Pulmonary sarcoidosis   . Glaucoma   . Vitamin D deficiency   . Anxiety   . Asthma   . Depression   . Herpes simplex   . Osteoarthritis    Past Surgical History  Procedure Date  . Bunionectomy     x2  . Myomectomy   . Dental surgery   . Dilation and curettage of uterus   . Glaucoma surgery     Right eye    reports that she has never smoked. She has never used smokeless tobacco. She reports that she does not drink alcohol or use illicit drugs. family history includes Cancer in her mother; Hypertension in her father; and Stroke in her father. Allergies  Allergen Reactions  . Carvedilol     REACTION: tired  . Cefdinir   . Cephalexin     REACTION: unspecified  . Hydrochlorothiazide     REACTION: unspecified  . WUJ:WJXBJYNWGNF+AOZHYQMVH+QIONGEXBMW Acid+Aspartame     REACTION: GI symptoms  . Sulfamethoxazole     REACTION: unspecified  . Sulfonamide Derivatives       Outpatient Encounter Prescriptions as of 12/28/2010  Medication Sig Dispense Refill  . 5-Hydroxytryptophan (5-HTP PO) Take 500 mg by mouth 2  (two) times daily.        [name] acyclovir (ZOVIRAX) 5 % cream Apply topically as needed.        [name] amLODipine (NORVASC) 5 MG tablet Take 2.5 mg by mouth daily.        . Calcium-Magnesium-Vitamin D (SUPER CAL-MAG-D) [phone] MG-MG-UNIT TABS Take by mouth. 1-2 daily at bedtime       . Dorzolamide HCl-Timolol Mal (COSOPT OP) Apply to eye. 1 drop two times a day in left eye       . fexofenadine (ALLEGRA) 180 MG tablet Take 1 tablet (180 mg total) by mouth daily.  90 tablet  3  . fluticasone (FLONASE) 50 MCG/ACT nasal spray Place 2 sprays into the nose daily.  16 g  prn  . glucose blood (FREESTYLE LITE) test strip Use as instructed  100 each  0  . Hypertonic Nasal Wash (NASAFLO NETI POT NASAL WASH NA) by Nasal route.        [name] K-VESCENT 25 MEQ disintegrating tablet TAKE ONE (1) TABLET(S) BY MOUTH TWICE DAILY  60 tablet  5  . losartan (COZAAR) 100 MG tablet Take 100 mg by mouth daily.        . Multiple Vitamins-Minerals (HAIR/SKIN/NAILS) TABS Take by mouth daily.        . Nebivolol HCl (BYSTOLIC) 20 MG TABS Take 0.5 tablets by mouth daily.       [name]  Omega-3 Fatty Acids (FISH OIL) 1000 MG CAPS Take by mouth. 2 tabs once daily      . valACYclovir (VALTREX) 1000 MG tablet Take 1,000 mg by mouth 2 (two) times daily. 1/2 tablet two times a day       . vitamin B-12 (CYANOCOBALAMIN) 500 MCG tablet Take 500 mcg by mouth daily.        [name] DISCONTD: Doxylamine Succinate, Sleep, (SLEEP AID PO) Take by mouth as needed. supplement       . DISCONTD: fluconazole (DIFLUCAN) 150 MG tablet Take 150 mg by mouth as needed.         REVIEW OF SYSTEMS  : Positive for fatigue, sleeping problems, and anxiety. All other systems reviewed and negative except where noted in the History of Present Illness.   PHYSICAL EXAM: BP 110/72  Pulse 68  Ht 5' (1.524 m)  Wt 67.132 kg (148 lb)  BMI 28.90 kg/m2 General: Well developed black female in no acute distress Head: Normocephalic and atraumatic Eyes:  sclerae anicteric,conjunctive  pink. Ears: Normal auditory acuity Mouth: No deformity or lesions Neck: Supple, no masses.  Lungs: Clear throughout to auscultation Heart: Regular rate and rhythm Abdomen: Soft, non distended, nontender. No masses or hepatomegaly noted. Normal Bowel sounds Rectal: No stool in vault.No external lesions felt.  Musculoskeletal: Symmetrical with no gross deformities  Skin: No lesions on visible extremities Extremities: No edema or deformities noted Neurological: Alert oriented x 4, grossly nonfocal Cervical Nodes:  No significant cervical adenopathy Psychological:  Alert and cooperative. Normal mood and affect  ASSESSMENT AND PLAN;

## 2010-12-28 NOTE — Assessment & Plan Note (Addendum)
Mild (1/10 on the pain scale) LLQ pain and increased intestinal gas of one week duration. Bowel movements are normal except for being lighter in color, smalller caliber than usual. Patient started a probiotic several days ago, so far no significant improvement. Rectal exam unremarkable. Check a urinalysis, CBC. Etiology of this mild pain is not clear. If pain doesn't subside or if it progresses then she may need a CTscan. Will call her with labs results and conditon update in a couple of days.

## 2010-12-28 NOTE — Patient Instructions (Signed)
Go directly to the basement to have your labs drawn today. Continue your probiotic.  Call us on Monday or Tuesday with a update on your symptoms. cc: Sonda Primes, MD

## 2010-12-31 NOTE — Progress Notes (Signed)
Reviewed, would start Levsin SL.125 mg q 4 hrs prn LLQ abd. Pain for symptomatic relieve. Also start Metamucil 1 tsp po qd while awaiting CT scan.

## 2011-01-01 ENCOUNTER — Telehealth: Payer: Self-pay | Admitting: *Deleted

## 2011-01-01 NOTE — Telephone Encounter (Signed)
Spoke with patient and gave her results. She states she was feeling better on Saturday and Sunday but not as good today. States still feels the sensation in her stomach but not pain.

## 2011-01-01 NOTE — Telephone Encounter (Signed)
If discomfort is still in lower abdomen lets start her on Levsin 0.125mg  tid as needed for discomfort. Also, please start her on daily Citrucel. If discomfort persists she needs to back to clinic. Thanks

## 2011-01-01 NOTE — Telephone Encounter (Signed)
Message copied by Daphine Deutscher on Tue Jan 01, 2011  1:40 PM ------      Message from: Meredith Pel      Created: Tue Jan 01, 2011 12:06 PM       Rene Kocher, please let patient know that all labs are normal. Is she feeling any better? Thanks

## 2011-01-02 NOTE — Telephone Encounter (Signed)
Left a message for patient to call me. 

## 2011-01-02 NOTE — Telephone Encounter (Signed)
Spoke with patient and she is feeling better today. She will try taking Citrucel. Levsin is listed as an allergy. Did not order this.

## 2011-01-03 ENCOUNTER — Encounter: Payer: 59 | Admitting: Internal Medicine

## 2011-01-11 ENCOUNTER — Ambulatory Visit (HOSPITAL_BASED_OUTPATIENT_CLINIC_OR_DEPARTMENT_OTHER): Payer: 59 | Attending: Internal Medicine

## 2011-01-11 VITALS — Ht 60.0 in | Wt 147.0 lb

## 2011-01-11 DIAGNOSIS — R0989 Other specified symptoms and signs involving the circulatory and respiratory systems: Secondary | ICD-10-CM | POA: Insufficient documentation

## 2011-01-11 DIAGNOSIS — R0609 Other forms of dyspnea: Secondary | ICD-10-CM | POA: Insufficient documentation

## 2011-01-11 DIAGNOSIS — I498 Other specified cardiac arrhythmias: Secondary | ICD-10-CM | POA: Insufficient documentation

## 2011-01-11 DIAGNOSIS — G4733 Obstructive sleep apnea (adult) (pediatric): Secondary | ICD-10-CM

## 2011-01-11 DIAGNOSIS — R259 Unspecified abnormal involuntary movements: Secondary | ICD-10-CM | POA: Insufficient documentation

## 2011-01-11 DIAGNOSIS — G4710 Hypersomnia, unspecified: Secondary | ICD-10-CM | POA: Insufficient documentation

## 2011-01-16 ENCOUNTER — Other Ambulatory Visit: Payer: Self-pay | Admitting: *Deleted

## 2011-01-16 ENCOUNTER — Ambulatory Visit: Payer: 59 | Admitting: Critical Care Medicine

## 2011-01-16 MED ORDER — LOSARTAN POTASSIUM 100 MG PO TABS: 100.0000 mg | ORAL_TABLET | Freq: Every day | ORAL | Status: DC

## 2011-01-19 DIAGNOSIS — G4730 Sleep apnea, unspecified: Secondary | ICD-10-CM

## 2011-01-19 DIAGNOSIS — R259 Unspecified abnormal involuntary movements: Secondary | ICD-10-CM

## 2011-01-19 DIAGNOSIS — I498 Other specified cardiac arrhythmias: Secondary | ICD-10-CM

## 2011-01-19 DIAGNOSIS — R0989 Other specified symptoms and signs involving the circulatory and respiratory systems: Secondary | ICD-10-CM

## 2011-01-19 DIAGNOSIS — R0609 Other forms of dyspnea: Secondary | ICD-10-CM

## 2011-01-19 DIAGNOSIS — G4710 Hypersomnia, unspecified: Secondary | ICD-10-CM

## 2011-01-20 NOTE — Procedures (Signed)
[name], [name]               ACCOUNT NO.:  [id number]  MEDICAL RECORD NO.:  [id number]          PATIENT TYPE:  OUT  LOCATION:  SLEEP CENTER                 FACILITY:  [facility]  PHYSICIAN:  Ponce Skillman D. Maple Hudson, MD, FCCP, FACPDATE OF BIRTH:  06-Jun-1960  DATE OF STUDY:  01/11/2011                           NOCTURNAL POLYSOMNOGRAM  REFERRING PHYSICIAN:  Glory Graefe D. Maple Hudson, MD, FCCP, FACP  REFERRING PHYSICIAN:  Rameen Quinney D. Alick Lecomte, MD, FCCP, FACP  INDICATION FOR STUDY:  Hypersomnia with sleep apnea.  EPWORTH SLEEPINESS SCORE:  5/24.  BMI 28.7, weight 147 pounds, height 60 inches, neck 13 inches.  MEDICATIONS:  Home medications are charted and reviewed.  SLEEP ARCHITECTURE:  Total sleep time 383.5 minutes with sleep efficiency 92%.  Stage I was 9.8%, stage II 50.4%, stage III 14.2%, REM 17.6% of total sleep time.  Sleep latency 1.5 minutes, REM latency 62.5 minutes, awake after sleep onset 32 minutes, arousal index 6.7.  BEDTIME MEDICATION:  None.  RESPIRATORY DATA:  Apnea-hypopnea index (AHI) 0.2 per hour.  A single event was recorded as a central apnea while nonsupine and non-REM sleep. CPAP protocol titration was not indicated.  OXYGEN DATA:  Moderately loud snoring with oxygen desaturation to a nadir of 86% and the mean oxygen saturation through the study of 91.2% on room air.  CARDIAC DATA:  Sinus arrhythmia.  MOVEMENT-PARASOMNIA:  A total of 78 limb jerks were counted, of which 13 were associated with arousals or awakening for periodic limb movement with arousal index of 2 per hour.  One bathroom trip.  IMPRESSIONS-RECOMMENDATIONS: 1. Unremarkable sleep architecture for Sleep Center environment     without medication. 2. Insignificant respiratory disturbance of sleep, within normal     limits.  AHI 0.2 per hour (the normal range is from 0-5 per hour).     Moderately loud snoring with oxygen desaturation to a nadir of 86%     and the mean oxygen saturation through the study  of 91.2% on room     air.  A total of 7.6 minutes was recorded with room air saturation     less than 88% during the total recording time, awake, and asleep. 3. Periodic limb movement with arousal.  A total of 78 limb jerks were     counted, of which 13 were associated     with arousal or awakening for periodic limb movement with arousal     index of 2 per hour.  There may be modest benefit to considering     specific therapy if clinically appropriate.     Browning Southwood D. Maple Hudson, MD, [facility], FACP Diplomate, Biomedical engineer of Sleep Medicine Electronically Signed    CDY/MEDQ  D:  01/19/2011 13:41:34  T:  01/20/2011 02:46:43  Job:  [id number]

## 2011-01-25 ENCOUNTER — Encounter: Payer: Self-pay | Admitting: Internal Medicine

## 2011-01-25 ENCOUNTER — Ambulatory Visit (INDEPENDENT_AMBULATORY_CARE_PROVIDER_SITE_OTHER): Payer: 59 | Admitting: Internal Medicine

## 2011-01-25 VITALS — BP 126/72 | HR 69 | Ht 60.0 in | Wt 145.6 lb

## 2011-01-25 DIAGNOSIS — G4710 Hypersomnia, unspecified: Secondary | ICD-10-CM

## 2011-01-25 DIAGNOSIS — J309 Allergic rhinitis, unspecified: Secondary | ICD-10-CM

## 2011-01-25 DIAGNOSIS — D869 Sarcoidosis, unspecified: Secondary | ICD-10-CM

## 2011-01-25 DIAGNOSIS — G4733 Obstructive sleep apnea (adult) (pediatric): Secondary | ICD-10-CM

## 2011-01-25 MED ORDER — MOMETASONE FUROATE 50 MCG/ACT NA SUSP: 2.0000 | Freq: Every day | NASAL | Status: DC

## 2011-01-25 NOTE — Patient Instructions (Signed)
You do not have significant sleep apnea. We will just try to keep your nose working well and remind you that it may help to sleep off the flat of your back.  We have changed your nasal spray back to nasonex as you requested.

## 2011-01-25 NOTE — Progress Notes (Signed)
11/09/10- 51 year old female followed in the past for sarcoid and allergic rhinitis complicated by glaucoma, HBP, asthma. Dr. Delford Field obtained history the she stays tired. She sleeps alone but is told she snores. A remote nocturnal polysomnogram have been negative. Now, an unattended home sleep study "[name]" on 09/19/2010 documented mild obstructive apnea, AHI 9.8 per hour with mean oxygen saturation of 90%, minimum of 75% on room air and significant snoring. Bedtime is between 9 and 11 PM, sleep latency 10 minutes, waking once or twice before up in the morning. She sets 2 alarm clocks, 1 across the room. Has cut back on caffeine. Naps do help. Denies history of heart disease, ENT surgery or thyroid disease. Uses saline lavage occasionally for stuffy nose, Xyzal for allergy. Declines flu vaccine. Never smoked. 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  12/14/10- 51 year old female followed in the past for sarcoid and allergic rhinitis complicated by glaucoma, HBP, asthma. OSA She declines flu vaccine after discussion. Gives history of an intense systemic reaction to her first one. She had had a home sleep study showing mild obstructive sleep apnea, 9.8 per hour as detailed last visit. She wanted to wait on CPAP. She has decided now that she wants a more sensitive and detailed overnight sleep Center attended sleep study before making a treatment decision. We talked again about conservative measures such as sleeping off flat of back and keeping weight down. Rhinitis has been better, using Allegra and Nasonex. For the past 2 days she has had increased nasal congestion and frontal sinus pressure. She has begun using a saline nasal rinse.  01/25/11- 51 year old female followed in the past for sarcoid and allergic rhinitis complicated by glaucoma, HBP, asthma.  She prefers Nasonex to [employer] and asks her prescription be changed. Allegra helps her allergy symptoms. Has had no significant  respiratory infection this winter. We reviewed her attended nocturnal polysomnogram 01/11/2011-AHI 0.2 per hour, within normal limits. Her unattended study("[name]") had shown AHI 9.8 per hour. She has lost 4 pounds between the 2 studies. Based on these results, I suggested only very conservative therapy, sleeping off flat of back as we continue treating for nasal congestion. I am removing obstructive sleep apnea from her problem list.   ROS-see HPI Constitutional:   No-   weight loss, night sweats, fevers, chills, fatigue, lassitude. HEENT:   +  headaches, No-difficulty swallowing, tooth/dental problems, sore throat,       No-  sneezing, itching, ear ache, +nasal congestion, post nasal drip,  CV:  No-   chest pain, orthopnea, PND, swelling in lower extremities, anasarca, dizziness, palpitations Resp: No- acute  shortness of breath with exertion or at rest.              No-   productive cough,  No non-productive cough,  No- coughing up of blood.              N.o-   change in color of mucus.  No- wheezing.   Skin: No-   rash or lesions. GI:  No-   heartburn, indigestion, abdominal pain, nausea, vomiting, diarrhea,                 change in bowel habits, loss of appetite GU:  MS:  No-   joint pain or swelling.  No- decreased range of motion.  No- back pain. Neuro-     nothing unusual Psych:  No- change in mood or affect. No depression or anxiety.  No memory loss.  OBJ General- Alert, Oriented, Affect-appropriate, Distress- none acute, medium build Skin- rash-none, lesions- none, excoriation- none Lymphadenopathy- none Head- atraumatic            Eyes- Gross vision intact, PERRLA, conjunctivae clear secretions. Mild periorbital edema            Ears- Hearing, canals-normal            Nose- +Mucus bridging, +Septal dev,  polyps, erosion, perforation             Throat- Mallampati III , mucosa clear , drainage- none, tonsils- atrophic Neck- flexible , trachea midline, no stridor , thyroid nl,  carotid no bruit Chest - symmetrical excursion , unlabored           Heart/CV- RRR , no murmur , no gallop  , no rub, nl s1 s2                           - JVD- none , edema- none, stasis changes- none, varices- none           Lung- clear to P&A, wheeze- none, cough- none , dullness-none, rub- none           Chest wall-  Abd-  Br/ Gen/ Rectal- Not done, not indicated Extrem- cyanosis- none, clubbing, none, atrophy- none, strength- nl Neuro- grossly intact to observation

## 2011-01-27 NOTE — Assessment & Plan Note (Signed)
She prefers Nasonex to [facility] and is being changed back to that. Mild septal deviation is noted, without obstruction.

## 2011-01-27 NOTE — Assessment & Plan Note (Signed)
Watch this is a symptom of depression. Organic hypersomnia is considered unlikely.

## 2011-01-27 NOTE — Assessment & Plan Note (Signed)
We discussed weight control, sleep off flat of back and treatment for nasal congestion. I considered this problem ruled out.

## 2011-02-13 ENCOUNTER — Ambulatory Visit (INDEPENDENT_AMBULATORY_CARE_PROVIDER_SITE_OTHER): Payer: 59 | Admitting: Critical Care Medicine

## 2011-02-13 ENCOUNTER — Encounter: Payer: Self-pay | Admitting: *Deleted

## 2011-02-13 ENCOUNTER — Encounter: Payer: Self-pay | Admitting: Critical Care Medicine

## 2011-02-13 ENCOUNTER — Telehealth: Payer: Self-pay | Admitting: *Deleted

## 2011-02-13 VITALS — BP 120/74 | HR 69 | Temp 98.6°F | Ht 60.0 in | Wt 145.8 lb

## 2011-02-13 DIAGNOSIS — J310 Chronic rhinitis: Secondary | ICD-10-CM

## 2011-02-13 DIAGNOSIS — J309 Allergic rhinitis, unspecified: Secondary | ICD-10-CM

## 2011-02-13 NOTE — Patient Instructions (Signed)
No change in medications. Return in      4 months A referral back to [facility] will be made

## 2011-02-13 NOTE — Assessment & Plan Note (Signed)
Severe persistent rhinitis with nasal obstruction noted on exam  Plan Referral to ENT for evaluation of sinuses ? If candidate for nasal septoplasty/turbinate reduction? Cont nasonex and allegra

## 2011-02-13 NOTE — Progress Notes (Signed)
Subjective:    Patient ID: [name], female    DOB: 1960/07/25, 51 y.o.   MRN: [id number]  HPI  51 y.o. AAF  female with a history of sarcoidosis.   02/13/2011 Osa not diagnosed. Notes this week gets congested in the head. For three nights takes mucinex.  Occ sinus rinse is used.  Nasonex is used.    Using allegra as well. Notes dyspnea is going fast. Pt denies any significant sore throat, nasal congestion or excess secretions, fever, chills, sweats, unintended weight loss, pleurtic or exertional chest pain, orthopnea PND, or leg swelling Pt denies any increase in rescue therapy over baseline, denies waking up needing it or having any early am or nocturnal exacerbations of coughing/wheezing/or dyspnea. Pt also denies any obvious fluctuation in symptoms with  weather or environmental change or other alleviating or aggravating factors    Past Medical History  Diagnosis Date  . Osteoporosis   . Hypertension   . Depression   . Glaucoma   . Pulmonary sarcoidosis   . Glaucoma   . Vitamin D deficiency   . Anxiety   . Asthma   . Depression   . Herpes simplex   . Osteoarthritis      Family History  Problem Relation Age of Onset  . Cancer Mother     died at age 21-lung cancer, sarcoid  . Stroke Father   . Hypertension Father      History   Social History  . Marital Status: Divorced    Spouse Name: N/A    Number of Children: N/A  . Years of Education: N/A   Occupational History  . Not on file.   Social History Main Topics  . Smoking status: Never Smoker   . Smokeless tobacco: Never Used  . Alcohol Use: No  . Drug Use: No  . Sexually Active: Not on file   Other Topics Concern  . Not on file   Social History Narrative   Single/divorced; no children; she has a friend nowNever smokedPositive history of passive tobacco smoke exposure.Exercise- up to 4 times a weekCaffeine- 1-2 per week     Allergies  Allergen Reactions  . Carvedilol     REACTION: tired  .  Cefdinir   . Cephalexin     REACTION: unspecified  . Hydrochlorothiazide     REACTION: unspecified  . WUJ:[id number]+AOZHYQMVH+QIONGEXBMW Acid+Aspartame     REACTION: GI symptoms  . Sulfamethoxazole     REACTION: unspecified  . Sulfonamide Derivatives      Outpatient Prescriptions Prior to Visit  Medication Sig Dispense Refill  . acyclovir (ZOVIRAX) 5 % cream Apply topically as needed.        [name] amLODipine (NORVASC) 5 MG tablet Take by mouth daily. 1/2 to 1 tablet daily      . Calcium-Magnesium-Vitamin D (SUPER CAL-MAG-D) 500-250-125 MG-MG-UNIT TABS Take by mouth. 1-2 daily at bedtime       . Dorzolamide HCl-Timolol Mal (COSOPT OP) Apply to eye. 1 drop two times a day in left eye       . fexofenadine (ALLEGRA) 180 MG tablet Take 1 tablet (180 mg total) by mouth daily.  90 tablet  3  . Hypertonic Nasal Wash (NASAFLO NETI POT NASAL WASH NA) by Nasal route.        [name] K-VESCENT 25 MEQ disintegrating tablet TAKE ONE (1) TABLET(S) BY MOUTH TWICE DAILY  60 tablet  5  . losartan (COZAAR) 100 MG tablet Take 1 tablet (100 mg total) by mouth  daily.  30 tablet  5  . mometasone (NASONEX) 50 MCG/ACT nasal spray Place 2 sprays into the nose daily.  17 g  prn  . Multiple Vitamins-Minerals (HAIR/SKIN/NAILS) TABS Take by mouth daily.        . Nebivolol HCl (BYSTOLIC) 20 MG TABS Take 0.5 tablets by mouth daily.       . Omega-3 Fatty Acids (FISH OIL) 1000 MG CAPS Take by mouth. 2 tabs once daily      . valACYclovir (VALTREX) 1000 MG tablet Take 500 mg by mouth 2 (two) times daily. 1/2 tablet two times a day      . 5-Hydroxytryptophan (5-HTP PO) Take 500 mg by mouth 2 (two) times daily.        [name] glucose blood (FREESTYLE LITE) test strip Use as instructed  100 each  0  . vitamin B-12 (CYANOCOBALAMIN) 500 MCG tablet Take 500 mcg by mouth daily.            Review of Systems  Constitutional:   No  weight loss, night sweats,  Fevers, chills, fatigue, lassitude. HEENT:   No headaches,  Difficulty  swallowing,  Tooth/dental problems,  Sore throat,                Notes  Sneezing, no  itching, ear ache, notes nasal congestion and  post nasal drip,   CV:  No chest pain,  Orthopnea, PND, swelling in lower extremities, anasarca, dizziness, palpitations  GI  No heartburn, indigestion, abdominal pain, nausea, vomiting, diarrhea, change in bowel habits, loss of appetite  Resp: No shortness of breath with exertion or at rest.  No excess mucus, no productive cough,  No non-productive cough,  No coughing up of blood.  No change in color of mucus.  No wheezing.  No chest wall deformity  Skin: no rash or lesions.  GU: no dysuria, change in color of urine, no urgency or frequency.  No flank pain.  MS:  No joint pain or swelling.  No decreased range of motion.  No back pain.  Psych:  No change in mood or affect. No depression or anxiety.  No memory loss.     Objective:   Physical Exam  BP 120/74  Pulse 69  Temp(Src) 98.6 F (37 C) (Oral)  Ht 5' (1.524 m)  Wt 145 lb 12.8 oz (66.134 kg)  BMI 28.47 kg/m2  SpO2 97%  Gen: Pleasant, well-nourished, in no distress,  normal affect  ENT: No lesions,  mouth clear,  oropharynx clear, no postnasal drip, severe nasal turbinate inflammation and narrowed nares, no purulence  Neck: No JVD, no TMG, no carotid bruits  Lungs: No use of accessory muscles, no dullness to percussion, clear without rales or rhonchi  Cardiovascular: RRR, heart sounds normal, no murmur or gallops, no peripheral edema  Abdomen: soft and NT, no HSM,  BS normal  Musculoskeletal: No deformities, no cyanosis or clubbing  Neuro: alert, non focal  Skin: Warm, no lesions or rashes        Assessment & Plan:   ALLERGIC RHINITIS Severe persistent rhinitis with nasal obstruction noted on exam  Plan Referral to ENT for evaluation of sinuses ? If candidate for nasal septoplasty/turbinate reduction? Cont nasonex and allegra      Updated Medication List Outpatient  Encounter Prescriptions as of 02/13/2011  Medication Sig Dispense Refill  . acyclovir (ZOVIRAX) 5 % cream Apply topically as needed.        [name] amLODipine (NORVASC) 5 MG tablet Take by  mouth daily. 1/2 to 1 tablet daily      . b complex vitamins capsule Take 1 capsule by mouth daily.      . Calcium-Magnesium-Vitamin D (SUPER CAL-MAG-D) 2177091190 MG-MG-UNIT TABS Take by mouth. 1-2 daily at bedtime       . Dorzolamide HCl-Timolol Mal (COSOPT OP) Apply to eye. 1 drop two times a day in left eye       . fexofenadine (ALLEGRA) 180 MG tablet Take 1 tablet (180 mg total) by mouth daily.  90 tablet  3  . Hypertonic Nasal Wash (NASAFLO NETI POT NASAL WASH NA) by Nasal route.        [name] K-VESCENT 25 MEQ disintegrating tablet TAKE ONE (1) TABLET(S) BY MOUTH TWICE DAILY  60 tablet  5  . losartan (COZAAR) 100 MG tablet Take 1 tablet (100 mg total) by mouth daily.  30 tablet  5  . mometasone (NASONEX) 50 MCG/ACT nasal spray Place 2 sprays into the nose daily.  17 g  prn  . Multiple Vitamins-Minerals (HAIR/SKIN/NAILS) TABS Take by mouth daily.        . Nebivolol HCl (BYSTOLIC) 20 MG TABS Take 0.5 tablets by mouth daily.       . Omega-3 Fatty Acids (FISH OIL) 1000 MG CAPS Take by mouth. 2 tabs once daily      . valACYclovir (VALTREX) 1000 MG tablet Take 500 mg by mouth 2 (two) times daily. 1/2 tablet two times a day      . 5-Hydroxytryptophan (5-HTP PO) Take 500 mg by mouth 2 (two) times daily.        [name] glucose blood (FREESTYLE LITE) test strip Use as instructed  100 each  0  . vitamin B-12 (CYANOCOBALAMIN) 500 MCG tablet Take 500 mcg by mouth daily.

## 2011-02-14 NOTE — Telephone Encounter (Signed)
Called, spoke with pt.  She would like letter to be faxed to [phone] and letter mailed to her home address which I verified.  Both have been done -- pt aware.  Nothing further needed at this time.

## 2011-02-18 ENCOUNTER — Other Ambulatory Visit: Payer: Self-pay | Admitting: Otolaryngology

## 2011-02-18 DIAGNOSIS — IMO0001 Reserved for inherently not codable concepts without codable children: Secondary | ICD-10-CM

## 2011-02-21 ENCOUNTER — Other Ambulatory Visit: Payer: 59

## 2011-02-22 ENCOUNTER — Ambulatory Visit
Admission: RE | Admit: 2011-02-22 | Discharge: 2011-02-22 | Disposition: A | Discharge status: Home / Self Care | Payer: 59 | Source: Ambulatory Visit | Attending: Otolaryngology | Admitting: Otolaryngology

## 2011-02-22 DIAGNOSIS — IMO0001 Reserved for inherently not codable concepts without codable children: Secondary | ICD-10-CM

## 2011-03-05 ENCOUNTER — Other Ambulatory Visit: Payer: Self-pay | Admitting: *Deleted

## 2011-03-05 MED ORDER — AMLODIPINE BESYLATE 5 MG PO TABS: 2.5000 mg | ORAL_TABLET | Freq: Every day | ORAL | Status: DC

## 2011-03-08 ENCOUNTER — Ambulatory Visit (INDEPENDENT_AMBULATORY_CARE_PROVIDER_SITE_OTHER): Payer: 59 | Admitting: Internal Medicine

## 2011-03-08 ENCOUNTER — Other Ambulatory Visit (INDEPENDENT_AMBULATORY_CARE_PROVIDER_SITE_OTHER): Payer: 59

## 2011-03-08 ENCOUNTER — Telehealth: Payer: Self-pay | Admitting: Internal Medicine

## 2011-03-08 ENCOUNTER — Encounter: Payer: Self-pay | Admitting: Internal Medicine

## 2011-03-08 VITALS — BP 110/60 | HR 72 | Temp 98.0°F | Resp 16 | Ht 60.0 in | Wt 140.0 lb

## 2011-03-08 DIAGNOSIS — I10 Essential (primary) hypertension: Secondary | ICD-10-CM

## 2011-03-08 DIAGNOSIS — Z0000 Encounter for general adult medical examination without abnormal findings: Secondary | ICD-10-CM

## 2011-03-08 DIAGNOSIS — F329 Major depressive disorder, single episode, unspecified: Secondary | ICD-10-CM

## 2011-03-08 DIAGNOSIS — J343 Hypertrophy of nasal turbinates: Secondary | ICD-10-CM

## 2011-03-08 DIAGNOSIS — M1990 Unspecified osteoarthritis, unspecified site: Secondary | ICD-10-CM

## 2011-03-08 DIAGNOSIS — R51 Headache: Secondary | ICD-10-CM

## 2011-03-08 LAB — LIPID PANEL
Cholesterol: 158 mg/dL (ref 0–200)
HDL: 43.8 mg/dL (ref >39.00)
LDL Cholesterol: 98 mg/dL (ref 0–99)
Total CHOL/HDL Ratio: 4
Triglycerides: 82 mg/dL (ref 0.0–149.0)
VLDL: 16.4 mg/dL (ref 0.0–40.0)

## 2011-03-08 LAB — CBC
MCV: 93.6 fl (ref 78.0–100.0)
Platelets: 297 10*3/uL (ref 150.0–400.0)
RBC: 4.4 Mil/uL (ref 3.87–5.11)
WBC: 5.1 10*3/uL (ref 4.5–10.5)

## 2011-03-08 LAB — URINALYSIS
Ketones, ur: NEGATIVE
Specific Gravity, Urine: 1.01 (ref 1.000–1.030)
Total Protein, Urine: NEGATIVE
Urobilinogen, UA: 0.2 (ref 0.0–1.0)
pH: 8.5 (ref 5.0–8.0)

## 2011-03-08 LAB — BASIC METABOLIC PANEL
Calcium: 10.1 mg/dL (ref 8.4–10.5)
GFR: 70.89 mL/min (ref >60.00)
Glucose, Bld: 93 mg/dL (ref 70–99)
Potassium: 4.1 mEq/L (ref 3.5–5.1)
Sodium: 139 mEq/L (ref 135–145)

## 2011-03-08 LAB — HEPATIC FUNCTION PANEL
ALT: 27 U/L (ref 0–35)
AST: 27 U/L (ref 0–37)
Albumin: 4.2 g/dL (ref 3.5–5.2)
Total Bilirubin: 0.9 mg/dL (ref 0.3–1.2)
Total Protein: 7.4 g/dL (ref 6.0–8.3)

## 2011-03-08 LAB — TSH: TSH: 1.97 u[IU]/mL (ref 0.35–5.50)

## 2011-03-08 NOTE — Telephone Encounter (Signed)
Stacey, please, inform patient that all labs are ok Thx 

## 2011-03-08 NOTE — Progress Notes (Signed)
Patient ID: [name], female   DOB: 1960-11-23, 51 y.o.   MRN: [id number]  Subjective:    Patient ID: [name], female    DOB: Jul 30, 1960, 51 y.o.   MRN: [id number]  HPI  The patient presents for a follow-up of  chronic hypertension, chronic dyslipidemia, sarcoid, allergies  controlled with medicines  C/o nasal congestion - Dr Narda Bonds    Review of Systems  Constitutional: Negative for chills, activity change, appetite change, fatigue and unexpected weight change.  HENT: Negative for congestion, mouth sores and sinus pressure.   Eyes: Negative for visual disturbance.  Respiratory: Negative for cough and chest tightness.   Gastrointestinal: Negative for nausea and abdominal pain.  Genitourinary: Negative for frequency, difficulty urinating and vaginal pain.  Musculoskeletal: Negative for back pain and gait problem.  Skin: Negative for pallor and rash.  Neurological: Negative for dizziness, tremors, weakness, numbness and headaches.  Psychiatric/Behavioral: Negative for confusion and sleep disturbance.   Wt Readings from Last 3 Encounters:  03/08/11 140 lb (63.504 kg)  02/13/11 145 lb 12.8 oz (66.134 kg)  01/25/11 145 lb 9.6 oz (66.044 kg)    BP Readings from Last 3 Encounters:  03/08/11 110/60  02/13/11 120/74  01/25/11 126/72        Objective:   Physical Exam  Constitutional: She appears well-developed and well-nourished. No distress.  HENT:  Head: Normocephalic.  Right Ear: External ear normal.  Left Ear: External ear normal.  Nose: Nose normal.  Mouth/Throat: Oropharynx is clear and moist.  Eyes: Conjunctivae are normal. Pupils are equal, round, and reactive to light. Right eye exhibits no discharge. Left eye exhibits no discharge.  Neck: Normal range of motion. Neck supple. No JVD present. No tracheal deviation present. No thyromegaly present.  Cardiovascular: Normal rate, regular rhythm and normal heart sounds.   Pulmonary/Chest: No stridor. No  respiratory distress. She has no wheezes.  Abdominal: Soft. Bowel sounds are normal. She exhibits no distension and no mass. There is no tenderness. There is no rebound and no guarding.  Musculoskeletal: She exhibits no edema and no tenderness.  Lymphadenopathy:    She has no cervical adenopathy.  Neurological: She displays normal reflexes. No cranial nerve deficit. She exhibits normal muscle tone. Coordination normal.  Skin: No rash noted. No erythema.  Psychiatric: She has a normal mood and affect. Her behavior is normal. Judgment and thought content normal.   Lab Results  Component Value Date   WBC 7.0 12/28/2010   HGB 13.2 12/28/2010   HCT 38.3 12/28/2010   PLT 311.0 12/28/2010   GLUCOSE 97 09/04/2010   CHOL 158 01/31/2010   TRIG 87.0 01/31/2010   HDL 38.50* 01/31/2010   LDLCALC 102* 01/31/2010   ALT 37* 01/31/2010   AST 31 01/31/2010   NA 141 09/04/2010   K 3.5 09/04/2010   CL 103 09/04/2010   CREATININE 0.9 09/04/2010   BUN 17 09/04/2010   CO2 34* 09/04/2010   TSH 2.37 01/31/2010   HGBA1C 5.9 11/21/2008          Assessment & Plan:

## 2011-03-08 NOTE — Assessment & Plan Note (Signed)
Continue with current prescription therapy as reflected on the Med list.  

## 2011-03-08 NOTE — Assessment & Plan Note (Signed)
2/13 Dr Ezzard Standing  --  Planning surgery

## 2011-03-08 NOTE — Assessment & Plan Note (Signed)
Better  

## 2011-03-11 NOTE — Telephone Encounter (Signed)
Left detailed mess informing pt of below.  

## 2011-05-25 ENCOUNTER — Ambulatory Visit (INDEPENDENT_AMBULATORY_CARE_PROVIDER_SITE_OTHER): Payer: 59 | Admitting: Family Medicine

## 2011-05-25 VITALS — BP 110/80 | HR 81 | Temp 98.8°F | Wt 139.0 lb

## 2011-05-25 DIAGNOSIS — J329 Chronic sinusitis, unspecified: Secondary | ICD-10-CM

## 2011-05-25 MED ORDER — CLARITHROMYCIN ER 500 MG PO TB24: 1000.0000 mg | ORAL_TABLET | Freq: Every day | ORAL | Status: AC

## 2011-05-25 MED ORDER — FLUCONAZOLE 150 MG PO TABS: ORAL_TABLET | ORAL | Status: DC

## 2011-05-25 MED ORDER — METHYLPREDNISOLONE ACETATE 80 MG/ML IJ SUSP
120.0000 mg | Freq: Once | INTRAMUSCULAR | Status: AC
  Administered 2011-05-25: 120 mg via INTRAMUSCULAR

## 2011-05-25 MED ORDER — MOMETASONE FUROATE 50 MCG/ACT NA SUSP: 2.0000 | Freq: Every day | NASAL | Status: DC

## 2011-05-25 NOTE — Patient Instructions (Signed)

## 2011-05-25 NOTE — Progress Notes (Signed)
  Subjective:     [name] is a 51 y.o. female who presents for evaluation of sinus pain. Symptoms include: congestion, cough, facial pain, fevers, headaches, nasal congestion and sinus pressure. Onset of symptoms was 1 week ago. Symptoms have been gradually worsening since that time. Past history is significant for no history of pneumonia or bronchitis. Patient is a non-smoker.  The following portions of the patient's history were reviewed and updated as appropriate: allergies, current medications, past family history, past medical history, past social history, past surgical history and problem list.  Review of Systems Pertinent items are noted in HPI.   Objective:    BP 110/80  Pulse 81  Temp(Src) 98.8 F (37.1 C) (Oral)  Wt 139 lb (63.05 kg)  SpO2 96% General appearance: alert, cooperative, appears stated age and no distress Ears: normal TM's and external ear canals both ears Nose: green discharge, moderate congestion, turbinates red, swollen, sinus tenderness bilateral Throat: lips, mucosa, and tongue normal; teeth and gums normal Neck: moderate anterior cervical adenopathy and thyroid not enlarged, symmetric, no tenderness/mass/nodules Lungs: clear to auscultation bilaterally Heart: S1, S2 normal    Assessment:    Acute bacterial sinusitis.    Plan:    Nasal steroids per medication orders. Antihistamines per medication orders. Biaxin per medication orders. f/u prn

## 2011-05-31 ENCOUNTER — Other Ambulatory Visit: Payer: Self-pay | Admitting: Internal Medicine

## 2011-06-20 ENCOUNTER — Other Ambulatory Visit: Payer: Self-pay | Admitting: Internal Medicine

## 2011-06-21 ENCOUNTER — Telehealth: Payer: Self-pay | Admitting: Internal Medicine

## 2011-06-21 NOTE — Telephone Encounter (Signed)
The pt called and is hoping to get more information on why her prescription refill was denied.   Thanks!

## 2011-06-21 NOTE — Telephone Encounter (Signed)
What med refill? Called pt to get more details. No answer/vm.  

## 2011-06-21 NOTE — Telephone Encounter (Signed)
What med refill? Called pt to get more details. No answer/vm.

## 2011-06-25 NOTE — Telephone Encounter (Signed)
Left mess for patient to call back.  

## 2011-07-01 NOTE — Telephone Encounter (Signed)
Left mess for patient to call back. Closing phone note.

## 2011-07-03 ENCOUNTER — Ambulatory Visit (INDEPENDENT_AMBULATORY_CARE_PROVIDER_SITE_OTHER)
Admission: RE | Admit: 2011-07-03 | Discharge: 2011-07-03 | Disposition: A | Discharge status: Home / Self Care | Payer: 59 | Source: Ambulatory Visit | Attending: Critical Care Medicine | Admitting: Critical Care Medicine

## 2011-07-03 ENCOUNTER — Ambulatory Visit (INDEPENDENT_AMBULATORY_CARE_PROVIDER_SITE_OTHER): Payer: 59 | Admitting: Critical Care Medicine

## 2011-07-03 ENCOUNTER — Encounter: Payer: Self-pay | Admitting: Critical Care Medicine

## 2011-07-03 VITALS — BP 118/72 | HR 61 | Temp 98.1°F | Ht 60.25 in | Wt 139.0 lb

## 2011-07-03 DIAGNOSIS — D869 Sarcoidosis, unspecified: Secondary | ICD-10-CM

## 2011-07-03 NOTE — Progress Notes (Signed)
Quick Note:  Called pt's home and cell #s - [email or web address] ______ 

## 2011-07-03 NOTE — Progress Notes (Signed)
Quick Note:  Notify the patient that the Xray is stable and no change in sarcoidosis  ______

## 2011-07-03 NOTE — Assessment & Plan Note (Signed)
Stable pulmonary sarcoidosis stage II Plan Maintain off systemic steroids at this time Note stable chest x-ray

## 2011-07-03 NOTE — Patient Instructions (Signed)
Ok to stay off nasonex for now [facility] to use saline rinse as needed Stay on allegra Return 6 months

## 2011-07-03 NOTE — Progress Notes (Signed)
Subjective:    Patient ID: [name], female    DOB: 21-Jul-1960, 51 y.o.   MRN: [id number]  HPI  51 y.o. AAF  female with a history of sarcoidosis Stage II   07/03/2011 Pt did see ENT, turbinates enlarged but did not recommend surgery.  Did recommend the nasal rinse Now doing well. No new pulm issues. Pt denies any significant sore throat, nasal congestion or excess secretions, fever, chills, sweats, unintended weight loss, pleurtic or exertional chest pain, orthopnea PND, or leg swelling Pt denies any increase in rescue therapy over baseline, denies waking up needing it or having any early am or nocturnal exacerbations of coughing/wheezing/or dyspnea. Pt also denies any obvious fluctuation in symptoms with  weather or environmental change or other alleviating or aggravating factors    Past Medical History  Diagnosis Date  . Osteoporosis   . Hypertension   . Depression   . Glaucoma   . Pulmonary sarcoidosis   . Glaucoma   . Vitamin d deficiency   . Anxiety   . Asthma   . Depression   . Herpes simplex   . Osteoarthritis      Family History  Problem Relation Age of Onset  . Cancer Mother     died at age 50-lung cancer, sarcoid  . Stroke Father   . Hypertension Father      History   Social History  . Marital Status: Divorced    Spouse Name: N/A    Number of Children: N/A  . Years of Education: N/A   Occupational History  . Not on file.   Social History Main Topics  . Smoking status: Never Smoker   . Smokeless tobacco: Never Used  . Alcohol Use: No  . Drug Use: No  . Sexually Active: Not on file   Other Topics Concern  . Not on file   Social History Narrative   Single/divorced; no children; she has a friend nowNever smokedPositive history of passive tobacco smoke exposure.Exercise- up to 4 times a weekCaffeine- 1-2 per week     Allergies  Allergen Reactions  . Amoxicillin-Pot Clavulanate     REACTION: GI symptoms  . Carvedilol     REACTION:  tired  . Cefdinir   . Cephalexin     REACTION: unspecified  . Hydrochlorothiazide     REACTION: unspecified  . Sulfamethoxazole     REACTION: unspecified  . Sulfonamide Derivatives      Outpatient Prescriptions Prior to Visit  Medication Sig Dispense Refill  . acyclovir (ZOVIRAX) 5 % cream Apply topically as needed.        [name] amLODipine (NORVASC) 5 MG tablet Take 0.5 tablets (2.5 mg total) by mouth daily. 1/2 to 1 tablet daily  30 tablet  5  . Dorzolamide HCl-Timolol Mal (COSOPT OP) Apply to eye. 1 drop two times a day in left eye       . fexofenadine (ALLEGRA) 180 MG tablet Take 1 tablet (180 mg total) by mouth daily.  90 tablet  3  . fluconazole (DIFLUCAN) 150 MG tablet 1 po qd x1, may repeat in 3 days prn  2 tablet  2  . glucose blood (FREESTYLE LITE) test strip Use as instructed  100 each  0  . Hypertonic Nasal Wash (NASAFLO NETI POT NASAL WASH NA) by Nasal route.        [name] K-VESCENT 25 MEQ disintegrating tablet DISSOLVE & TAKE ONE TABLET BY MOUTH TWICE DAILY  60 each  0  . losartan (  COZAAR) 100 MG tablet Take 1 tablet (100 mg total) by mouth daily.  30 tablet  5  . Multiple Vitamins-Minerals (HAIR/SKIN/NAILS) TABS Take by mouth daily.        . Nebivolol HCl (BYSTOLIC) 20 MG TABS Take 0.5 tablets by mouth daily.       . Pyridoxine HCl (B-6) 100 MG TABS Take 1 tablet by mouth daily.      . valACYclovir (VALTREX) 1000 MG tablet Take 500 mg by mouth 2 (two) times daily. 1/2 tablet two times a day      . Omega-3 Fatty Acids (FISH OIL) 1000 MG CAPS Take by mouth. 2 tabs once daily      . b complex vitamins capsule Take 1 capsule by mouth daily.      . mometasone (NASONEX) 50 MCG/ACT nasal spray Place 2 sprays into the nose daily.  17 g  prn  . Multiple Vitamins-Minerals (MULTIVITAMIN PO) Take 3 each by mouth daily. [employer] Of Life          Review of Systems  Constitutional:   No  weight loss, night sweats,  Fevers, chills, fatigue, lassitude. HEENT:   No headaches,  Difficulty  swallowing,  Tooth/dental problems,  Sore throat,                Notes  Sneezing, no  itching, ear ache, notes nasal congestion and  post nasal drip,   CV:  No chest pain,  Orthopnea, PND, swelling in lower extremities, anasarca, dizziness, palpitations  GI  No heartburn, indigestion, abdominal pain, nausea, vomiting, diarrhea, change in bowel habits, loss of appetite  Resp: No shortness of breath with exertion or at rest.  No excess mucus, no productive cough,  No non-productive cough,  No coughing up of blood.  No change in color of mucus.  No wheezing.  No chest wall deformity  Skin: no rash or lesions.  GU: no dysuria, change in color of urine, no urgency or frequency.  No flank pain.  MS:  No joint pain or swelling.  No decreased range of motion.  No back pain.  Psych:  No change in mood or affect. No depression or anxiety.  No memory loss.     Objective:   Physical Exam  BP 118/72  Pulse 61  Temp 98.1 F (36.7 C) (Oral)  Ht 5' 0.25" (1.53 m)  Wt 139 lb (63.05 kg)  BMI 26.92 kg/m2  SpO2 96%  Gen: Pleasant, well-nourished, in no distress,  normal affect  ENT: No lesions,  mouth clear,  oropharynx clear, no postnasal drip, severe nasal turbinate inflammation and narrowed nares, no purulence  Neck: No JVD, no TMG, no carotid bruits  Lungs: No use of accessory muscles, no dullness to percussion, clear without rales or rhonchi  Cardiovascular: RRR, heart sounds normal, no murmur or gallops, no peripheral edema  Abdomen: soft and NT, no HSM,  BS normal  Musculoskeletal: No deformities, no cyanosis or clubbing  Neuro: alert, non focal  Skin: Warm, no lesions or rashes        Assessment & Plan:   SARCOIDOSIS, PULMONARY Stable pulmonary sarcoidosis stage II Plan Maintain off systemic steroids at this time Note stable chest x-ray    Updated Medication List Outpatient Encounter Prescriptions as of 07/03/2011  Medication Sig Dispense Refill  . acyclovir  (ZOVIRAX) 5 % cream Apply topically as needed.        [name] amLODipine (NORVASC) 5 MG tablet Take 0.5 tablets (2.5 mg total) by mouth  daily. 1/2 to 1 tablet daily  30 tablet  5  . Calcium-Magnesium-Vitamin D (SUPER CAL-MAG-D) 500-250-125 MG-MG-UNIT TABS Take by mouth. 1-2 daily      . Dorzolamide HCl-Timolol Mal (COSOPT OP) Apply to eye. 1 drop two times a day in left eye       . fexofenadine (ALLEGRA) 180 MG tablet Take 1 tablet (180 mg total) by mouth daily.  90 tablet  3  . fluconazole (DIFLUCAN) 150 MG tablet 1 po qd x1, may repeat in 3 days prn  2 tablet  2  . glucose blood (FREESTYLE LITE) test strip Use as instructed  100 each  0  . Hypertonic Nasal Wash (NASAFLO NETI POT NASAL WASH NA) by Nasal route.        [name] K-VESCENT 25 MEQ disintegrating tablet DISSOLVE & TAKE ONE TABLET BY MOUTH TWICE DAILY  60 each  0  . losartan (COZAAR) 100 MG tablet Take 1 tablet (100 mg total) by mouth daily.  30 tablet  5  . Multiple Vitamins-Minerals (HAIR/SKIN/NAILS) TABS Take by mouth daily.        . Nebivolol HCl (BYSTOLIC) 20 MG TABS Take 0.5 tablets by mouth daily.       . Pyridoxine HCl (B-6) 100 MG TABS Take 1 tablet by mouth daily.      . valACYclovir (VALTREX) 1000 MG tablet Take 500 mg by mouth 2 (two) times daily. 1/2 tablet two times a day      . mometasone (NASONEX) 50 MCG/ACT nasal spray Place 2 sprays into the nose daily. ON HOLD      . Omega-3 Fatty Acids (FISH OIL) 1000 MG CAPS Take by mouth. 2 tabs once daily      . DISCONTD: b complex vitamins capsule Take 1 capsule by mouth daily.      [name] DISCONTD: mometasone (NASONEX) 50 MCG/ACT nasal spray Place 2 sprays into the nose daily.  17 g  prn  . DISCONTD: Multiple Vitamins-Minerals (MULTIVITAMIN PO) Take 3 each by mouth daily. [employer] Life

## 2011-07-04 NOTE — Progress Notes (Signed)
Quick Note:  Called, spoke with pt. Advised CXR is stable; no change in sarcoidosis per Dr. Delford Field. She verbalized understanding of this and voiced no further questions or concerns at this time. ______

## 2011-07-08 ENCOUNTER — Encounter: Payer: Self-pay | Admitting: Internal Medicine

## 2011-07-08 ENCOUNTER — Other Ambulatory Visit (INDEPENDENT_AMBULATORY_CARE_PROVIDER_SITE_OTHER): Payer: 59

## 2011-07-08 ENCOUNTER — Ambulatory Visit (INDEPENDENT_AMBULATORY_CARE_PROVIDER_SITE_OTHER): Payer: 59 | Admitting: Internal Medicine

## 2011-07-08 VITALS — BP 128/84 | HR 62 | Temp 98.2°F | Resp 16 | Wt 140.0 lb

## 2011-07-08 DIAGNOSIS — J45909 Unspecified asthma, uncomplicated: Secondary | ICD-10-CM

## 2011-07-08 DIAGNOSIS — F3289 Other specified depressive episodes: Secondary | ICD-10-CM

## 2011-07-08 DIAGNOSIS — R002 Palpitations: Secondary | ICD-10-CM

## 2011-07-08 DIAGNOSIS — E785 Hyperlipidemia, unspecified: Secondary | ICD-10-CM

## 2011-07-08 DIAGNOSIS — I10 Essential (primary) hypertension: Secondary | ICD-10-CM

## 2011-07-08 DIAGNOSIS — M899 Disorder of bone, unspecified: Secondary | ICD-10-CM

## 2011-07-08 DIAGNOSIS — M1990 Unspecified osteoarthritis, unspecified site: Secondary | ICD-10-CM

## 2011-07-08 DIAGNOSIS — N959 Unspecified menopausal and perimenopausal disorder: Secondary | ICD-10-CM

## 2011-07-08 DIAGNOSIS — F329 Major depressive disorder, single episode, unspecified: Secondary | ICD-10-CM

## 2011-07-08 DIAGNOSIS — J343 Hypertrophy of nasal turbinates: Secondary | ICD-10-CM

## 2011-07-08 DIAGNOSIS — R51 Headache: Secondary | ICD-10-CM

## 2011-07-08 DIAGNOSIS — M8580 Other specified disorders of bone density and structure, unspecified site: Secondary | ICD-10-CM

## 2011-07-08 LAB — LIPID PANEL
Cholesterol: 140 mg/dL (ref 0–200)
VLDL: 16.6 mg/dL (ref 0.0–40.0)

## 2011-07-08 LAB — CBC WITH DIFFERENTIAL/PLATELET
Basophils Absolute: 0.1 10*3/uL (ref 0.0–0.1)
Eosinophils Absolute: 0.1 10*3/uL (ref 0.0–0.7)
HCT: 39.9 % (ref 36.0–46.0)
Lymphocytes Relative: 29 % (ref 12.0–46.0)
Lymphs Abs: 1.7 10*3/uL (ref 0.7–4.0)
MCHC: 33.3 g/dL (ref 30.0–36.0)
Monocytes Relative: 11.1 % (ref 3.0–12.0)
Neutro Abs: 3.3 10*3/uL (ref 1.4–7.7)
Platelets: 313 10*3/uL (ref 150.0–400.0)
RDW: 14.9 % — ABNORMAL HIGH (ref 11.5–14.6)

## 2011-07-08 LAB — BASIC METABOLIC PANEL
BUN: 15 mg/dL (ref 6–23)
CO2: 29 mEq/L (ref 19–32)
Calcium: 9.6 mg/dL (ref 8.4–10.5)
GFR: 91.58 mL/min (ref >60.00)
Glucose, Bld: 90 mg/dL (ref 70–99)

## 2011-07-08 LAB — URINALYSIS
Hgb urine dipstick: NEGATIVE
Ketones, ur: NEGATIVE
Urine Glucose: NEGATIVE
Urobilinogen, UA: 0.2 (ref 0.0–1.0)

## 2011-07-08 LAB — TSH: TSH: 1.61 u[IU]/mL (ref 0.35–5.50)

## 2011-07-08 NOTE — Assessment & Plan Note (Signed)
Resolved 6 mo ago

## 2011-07-08 NOTE — Assessment & Plan Note (Signed)
Better  

## 2011-07-08 NOTE — Assessment & Plan Note (Signed)
Continue with current prescription therapy as reflected on the Med list.  

## 2011-07-08 NOTE — Progress Notes (Signed)
  Subjective:    Patient ID: [name], female    DOB: 12-02-1960, 51 y.o.   MRN: [id number]  HPI  The patient presents for a follow-up of  chronic hypertension, chronic dyslipidemia, sarcoid, allergies  controlled with medicines  F/u on nasal congestion - better    Review of Systems  Constitutional: Negative for chills, activity change, appetite change, fatigue and unexpected weight change.  HENT: Negative for congestion, mouth sores and sinus pressure.   Eyes: Negative for visual disturbance.  Respiratory: Negative for cough and chest tightness.   Gastrointestinal: Negative for nausea and abdominal pain.  Genitourinary: Negative for frequency, difficulty urinating and vaginal pain.  Musculoskeletal: Negative for back pain and gait problem.  Skin: Negative for pallor and rash.  Neurological: Negative for dizziness, tremors, weakness, numbness and headaches.  Psychiatric/Behavioral: Negative for confusion and disturbed wake/sleep cycle.   Wt Readings from Last 3 Encounters:  07/08/11 140 lb (63.504 kg)  07/03/11 139 lb (63.05 kg)  05/25/11 139 lb (63.05 kg)    BP Readings from Last 3 Encounters:  07/08/11 128/84  07/03/11 118/72  05/25/11 110/80        Objective:   Physical Exam  Constitutional: She appears well-developed and well-nourished. No distress.  HENT:  Head: Normocephalic.  Right Ear: External ear normal.  Left Ear: External ear normal.  Nose: Nose normal.  Mouth/Throat: Oropharynx is clear and moist.  Eyes: Conjunctivae are normal. Pupils are equal, round, and reactive to light. Right eye exhibits no discharge. Left eye exhibits no discharge.  Neck: Normal range of motion. Neck supple. No JVD present. No tracheal deviation present. No thyromegaly present.  Cardiovascular: Normal rate, regular rhythm and normal heart sounds.   Pulmonary/Chest: No stridor. No respiratory distress. She has no wheezes.  Abdominal: Soft. Bowel sounds are normal. She  exhibits no distension and no mass. There is no tenderness. There is no rebound and no guarding.  Musculoskeletal: She exhibits no edema and no tenderness.  Lymphadenopathy:    She has no cervical adenopathy.  Neurological: She displays normal reflexes. No cranial nerve deficit. She exhibits normal muscle tone. Coordination normal.  Skin: No rash noted. No erythema.  Psychiatric: She has a normal mood and affect. Her behavior is normal. Judgment and thought content normal.   Lab Results  Component Value Date   WBC 5.1 03/08/2011   HGB 13.7 03/08/2011   HCT 41.2 03/08/2011   PLT 297.0 03/08/2011   GLUCOSE 93 03/08/2011   CHOL 158 03/08/2011   TRIG 82.0 03/08/2011   HDL 43.80 03/08/2011   LDLCALC 98 03/08/2011   ALT 27 03/08/2011   AST 27 03/08/2011   NA 139 03/08/2011   K 4.1 03/08/2011   CL 102 03/08/2011   CREATININE 1.1 03/08/2011   BUN 16 03/08/2011   CO2 31 03/08/2011   TSH 1.97 03/08/2011   HGBA1C 5.9 11/21/2008          Assessment & Plan:

## 2011-07-08 NOTE — Patient Instructions (Signed)
BP Readings from Last 3 Encounters:  07/08/11 128/84  07/03/11 118/72  05/25/11 110/80   Wt Readings from Last 3 Encounters:  07/08/11 140 lb (63.504 kg)  07/03/11 139 lb (63.05 kg)  05/25/11 139 lb (63.05 kg)

## 2011-07-11 ENCOUNTER — Telehealth: Payer: Self-pay | Admitting: Internal Medicine

## 2011-07-11 NOTE — Telephone Encounter (Signed)
Called pt on Tuesday and called patient this am (6/27 @ 9am).  Left two voice mails for pt to call back and schedule.

## 2011-07-11 NOTE — Telephone Encounter (Signed)
Message copied by Newell Coral on Thu Jul 11, 2011  8:53 AM ------      Message from: Shelbie Proctor A      Created: Tue Jul 09, 2011 11:20 AM      Regarding: need a bone Meygan, Kyser [[id number]]       Order #: [id number] Procedure: DG BONE DENSITY       Order Date: 07/08/2011 Proc Category: Lenise Arena Dexa Orderables       Priority: Routine Class: Ancillary Performed       Standing Status: Future Expires: 09/06/2012 Status:         Ordering User: Tresa Garter, MD [[id number]] Department: [facility] Provider: Timoteo Expose Provider: Tresa Garter, MD       Diagnosis: Osteopenia             Sched Instruct: BDS if time Dx -- osteopenia      Thx!       Visit Types: DG DEXA [150200]      GG[facility] BONE DENSITY [151112]      [facility] DEXA 1 HOUR [1511]       Comment:         Order Specific Questions       Question Answer Comment       Reason for exam: f/u osteopenia         Is the patient pregnant? No         Preferred imaging location? [address]

## 2011-07-16 ENCOUNTER — Other Ambulatory Visit: Payer: Self-pay | Admitting: Internal Medicine

## 2011-07-17 ENCOUNTER — Other Ambulatory Visit: Payer: Self-pay | Admitting: Internal Medicine

## 2011-07-22 ENCOUNTER — Inpatient Hospital Stay: Admission: RE | Admit: 2011-07-22 | Payer: 59 | Source: Ambulatory Visit

## 2011-07-23 ENCOUNTER — Other Ambulatory Visit: Payer: Self-pay | Admitting: Internal Medicine

## 2011-09-02 ENCOUNTER — Other Ambulatory Visit (INDEPENDENT_AMBULATORY_CARE_PROVIDER_SITE_OTHER): Payer: 59

## 2011-09-02 ENCOUNTER — Encounter: Payer: Self-pay | Admitting: Internal Medicine

## 2011-09-02 ENCOUNTER — Ambulatory Visit (INDEPENDENT_AMBULATORY_CARE_PROVIDER_SITE_OTHER): Payer: 59 | Admitting: Internal Medicine

## 2011-09-02 ENCOUNTER — Ambulatory Visit
Admission: RE | Admit: 2011-09-02 | Discharge: 2011-09-02 | Disposition: A | Discharge status: Home / Self Care | Payer: Self-pay | Source: Ambulatory Visit | Attending: Internal Medicine | Admitting: Internal Medicine

## 2011-09-02 VITALS — BP 118/90 | HR 80 | Temp 97.2°F | Resp 16 | Wt 141.0 lb

## 2011-09-02 DIAGNOSIS — M8580 Other specified disorders of bone density and structure, unspecified site: Secondary | ICD-10-CM

## 2011-09-02 DIAGNOSIS — I10 Essential (primary) hypertension: Secondary | ICD-10-CM

## 2011-09-02 DIAGNOSIS — R739 Hyperglycemia, unspecified: Secondary | ICD-10-CM | POA: Insufficient documentation

## 2011-09-02 DIAGNOSIS — F329 Major depressive disorder, single episode, unspecified: Secondary | ICD-10-CM

## 2011-09-02 DIAGNOSIS — E785 Hyperlipidemia, unspecified: Secondary | ICD-10-CM

## 2011-09-02 DIAGNOSIS — F411 Generalized anxiety disorder: Secondary | ICD-10-CM

## 2011-09-02 DIAGNOSIS — F3289 Other specified depressive episodes: Secondary | ICD-10-CM

## 2011-09-02 DIAGNOSIS — R002 Palpitations: Secondary | ICD-10-CM

## 2011-09-02 DIAGNOSIS — N3289 Other specified disorders of bladder: Secondary | ICD-10-CM

## 2011-09-02 DIAGNOSIS — J45909 Unspecified asthma, uncomplicated: Secondary | ICD-10-CM

## 2011-09-02 DIAGNOSIS — N959 Unspecified menopausal and perimenopausal disorder: Secondary | ICD-10-CM

## 2011-09-02 LAB — URINALYSIS
Hgb urine dipstick: NEGATIVE
Nitrite: NEGATIVE
Specific Gravity, Urine: <=1.005 (ref 1.000–1.030)
Total Protein, Urine: NEGATIVE
pH: 7 (ref 5.0–8.0)

## 2011-09-02 LAB — BASIC METABOLIC PANEL
CO2: 31 mEq/L (ref 19–32)
Calcium: 10.1 mg/dL (ref 8.4–10.5)
Chloride: 104 mEq/L (ref 96–112)
Potassium: 3.8 mEq/L (ref 3.5–5.1)
Sodium: 142 mEq/L (ref 135–145)

## 2011-09-02 LAB — LIPID PANEL
Cholesterol: 149 mg/dL (ref 0–200)
LDL Cholesterol: 75 mg/dL (ref 0–99)
Triglycerides: 145 mg/dL (ref 0.0–149.0)

## 2011-09-02 LAB — HEPATIC FUNCTION PANEL
Albumin: 4.3 g/dL (ref 3.5–5.2)
Alkaline Phosphatase: 85 U/L (ref 39–117)
Total Protein: 7.8 g/dL (ref 6.0–8.3)

## 2011-09-02 MED ORDER — FESOTERODINE FUMARATE ER 4 MG PO TB24: 4.0000 mg | ORAL_TABLET | Freq: Every day | ORAL | Status: DC

## 2011-09-02 NOTE — Assessment & Plan Note (Signed)
Avoid irritants

## 2011-09-02 NOTE — Progress Notes (Signed)
Patient ID: [name], female   DOB: [birth year], 51 y.o.   MRN: [id number]  Subjective:    Patient ID: [name], female    DOB: 1960-10-16, 51 y.o.   MRN: [id number]  Urinary Frequency  This is a new problem. The current episode started more than 1 month ago. The problem occurs every urination. The problem has been unchanged. There has been no fever. There is no history of pyelonephritis. Associated symptoms include frequency and urgency. Pertinent negatives include no chills or nausea. The treatment provided no relief.    The patient presents for a follow-up of  chronic hypertension, chronic dyslipidemia, sarcoid, allergies  controlled with medicines  F/u on nasal congestion - better    Review of Systems  Constitutional: Negative for chills, activity change, appetite change, fatigue and unexpected weight change.  HENT: Negative for congestion, mouth sores and sinus pressure.   Eyes: Negative for visual disturbance.  Respiratory: Negative for cough and chest tightness.   Gastrointestinal: Negative for nausea and abdominal pain.  Genitourinary: Positive for urgency and frequency. Negative for difficulty urinating and vaginal pain.  Musculoskeletal: Negative for back pain and gait problem.  Skin: Negative for pallor and rash.  Neurological: Negative for dizziness, tremors, weakness, numbness and headaches.  Psychiatric/Behavioral: Negative for confusion and disturbed wake/sleep cycle.   Wt Readings from Last 3 Encounters:  09/02/11 141 lb (63.957 kg)  07/08/11 140 lb (63.504 kg)  07/03/11 139 lb (63.05 kg)    BP Readings from Last 3 Encounters:  09/02/11 118/90  07/08/11 128/84  07/03/11 118/72        Objective:   Physical Exam  Constitutional: She appears well-developed and well-nourished. No distress.  HENT:  Head: Normocephalic.  Right Ear: External ear normal.  Left Ear: External ear normal.  Nose: Nose normal.  Mouth/Throat: Oropharynx is clear and moist.   Eyes: Conjunctivae are normal. Pupils are equal, round, and reactive to light. Right eye exhibits no discharge. Left eye exhibits no discharge.  Neck: Normal range of motion. Neck supple. No JVD present. No tracheal deviation present. No thyromegaly present.  Cardiovascular: Normal rate, regular rhythm and normal heart sounds.   Pulmonary/Chest: No stridor. No respiratory distress. She has no wheezes.  Abdominal: Soft. Bowel sounds are normal. She exhibits no distension and no mass. There is no tenderness. There is no rebound and no guarding.  Musculoskeletal: She exhibits no edema and no tenderness.  Lymphadenopathy:    She has no cervical adenopathy.  Neurological: She displays normal reflexes. No cranial nerve deficit. She exhibits normal muscle tone. Coordination normal.  Skin: No rash noted. No erythema.  Psychiatric: She has a normal mood and affect. Her behavior is normal. Judgment and thought content normal.   Lab Results  Component Value Date   WBC 5.8 07/08/2011   HGB 13.3 07/08/2011   HCT 39.9 07/08/2011   PLT 313.0 07/08/2011   GLUCOSE 90 07/08/2011   CHOL 140 07/08/2011   TRIG 83.0 07/08/2011   HDL 44.00 07/08/2011   LDLCALC 79 07/08/2011   ALT 27 03/08/2011   AST 27 03/08/2011   NA 140 07/08/2011   K 3.7 07/08/2011   CL 104 07/08/2011   CREATININE 0.8 07/08/2011   BUN 15 07/08/2011   CO2 29 07/08/2011   TSH 1.61 07/08/2011   HGBA1C 5.9 11/21/2008          Assessment & Plan:

## 2011-09-02 NOTE — Assessment & Plan Note (Signed)
Better  

## 2011-09-02 NOTE — Assessment & Plan Note (Signed)
Continue with current prescription therapy as reflected on the Med list.  

## 2011-09-02 NOTE — Assessment & Plan Note (Signed)
8/13 likely aggravated by caffeine/water

## 2011-09-02 NOTE — Assessment & Plan Note (Signed)
Avoid stress

## 2011-09-02 NOTE — Assessment & Plan Note (Signed)
Check A1c. 

## 2011-09-04 ENCOUNTER — Other Ambulatory Visit: Payer: Self-pay | Admitting: Internal Medicine

## 2011-09-30 ENCOUNTER — Encounter: Payer: Self-pay | Admitting: Gastroenterology

## 2011-09-30 ENCOUNTER — Ambulatory Visit (INDEPENDENT_AMBULATORY_CARE_PROVIDER_SITE_OTHER): Payer: 59 | Admitting: Gastroenterology

## 2011-09-30 VITALS — BP 110/70 | HR 72 | Ht 60.0 in | Wt 142.2 lb

## 2011-09-30 DIAGNOSIS — R1012 Left upper quadrant pain: Secondary | ICD-10-CM | POA: Insufficient documentation

## 2011-09-30 NOTE — Assessment & Plan Note (Addendum)
Patient has more of a sensation in her left upper quadrant than pain which clearly is associated with chewing trident gum. I don't think there is any underlying GI pathology.

## 2011-09-30 NOTE — Progress Notes (Signed)
History of Present Illness:  Mrs. Langseth has returned for evaluation of left upper quadrant discomfort. When she chews trident gum she has a sensation in her left upper quadrant which is very poorly described. She's  denies pain, per se.  She is without excess belching or pyrosis. She also notes excess gas when she eats yogurt.    Review of Systems: Pertinent positive and negative review of systems were noted in the above HPI section. All other review of systems were otherwise negative.    Current Medications, Allergies, Past Medical History, Past Surgical History, Family History and Social History were reviewed in [facility] electronic medical record  Vital signs were reviewed in today's medical record. Physical Exam: General: Well developed , well nourished, no acute distress On abdominal exam there are no  masses,  tenderness or organomegaly

## 2011-09-30 NOTE — Patient Instructions (Addendum)
Follow up as needed

## 2011-10-14 ENCOUNTER — Ambulatory Visit (INDEPENDENT_AMBULATORY_CARE_PROVIDER_SITE_OTHER): Payer: 59 | Admitting: Internal Medicine

## 2011-10-14 ENCOUNTER — Encounter: Payer: Self-pay | Admitting: Internal Medicine

## 2011-10-14 VITALS — BP 120/70 | HR 68 | Temp 98.3°F | Resp 16 | Wt 143.0 lb

## 2011-10-14 DIAGNOSIS — F329 Major depressive disorder, single episode, unspecified: Secondary | ICD-10-CM

## 2011-10-14 DIAGNOSIS — R5383 Other fatigue: Secondary | ICD-10-CM

## 2011-10-14 DIAGNOSIS — R739 Hyperglycemia, unspecified: Secondary | ICD-10-CM

## 2011-10-14 DIAGNOSIS — F3289 Other specified depressive episodes: Secondary | ICD-10-CM

## 2011-10-14 DIAGNOSIS — I10 Essential (primary) hypertension: Secondary | ICD-10-CM

## 2011-10-14 DIAGNOSIS — J45909 Unspecified asthma, uncomplicated: Secondary | ICD-10-CM

## 2011-10-14 DIAGNOSIS — R5381 Other malaise: Secondary | ICD-10-CM

## 2011-10-14 DIAGNOSIS — F411 Generalized anxiety disorder: Secondary | ICD-10-CM

## 2011-10-14 DIAGNOSIS — R7309 Other abnormal glucose: Secondary | ICD-10-CM

## 2011-10-14 NOTE — Assessment & Plan Note (Signed)
Continue with current prescription therapy as reflected on the Med list.  

## 2011-10-14 NOTE — Assessment & Plan Note (Signed)
Discussed  She started walking

## 2011-10-14 NOTE — Assessment & Plan Note (Signed)
Watching CBGs 

## 2011-10-14 NOTE — Progress Notes (Signed)
   Subjective:   Patient ID: [name], female    DOB: 28-Jul-1960, 51 y.o.   MRN: [id number]  HPI  The patient presents for a follow-up of  chronic hypertension, chronic dyslipidemia, sarcoid, allergies  controlled with medicines      Review of Systems  Constitutional: Negative for chills, activity change, appetite change, fatigue and unexpected weight change.  HENT: Negative for congestion, mouth sores and sinus pressure.   Eyes: Negative for visual disturbance.  Respiratory: Negative for cough and chest tightness.   Gastrointestinal: Negative for nausea and abdominal pain.  Genitourinary: Negative for frequency, difficulty urinating and vaginal pain.  Musculoskeletal: Negative for back pain and gait problem.  Skin: Negative for pallor and rash.  Neurological: Negative for dizziness, tremors, weakness, numbness and headaches.  Psychiatric/Behavioral: Negative for confusion and disturbed wake/sleep cycle.   Wt Readings from Last 3 Encounters:  10/14/11 143 lb (64.864 kg)  09/30/11 142 lb 4 oz (64.524 kg)  09/02/11 141 lb (63.957 kg)    BP Readings from Last 3 Encounters:  10/14/11 120/70  09/30/11 110/70  09/02/11 118/90        Objective:   Physical Exam  Constitutional: She appears well-developed and well-nourished. No distress.  HENT:  Head: Normocephalic.  Right Ear: External ear normal.  Left Ear: External ear normal.  Nose: Nose normal.  Mouth/Throat: Oropharynx is clear and moist.  Eyes: Conjunctivae normal are normal. Pupils are equal, round, and reactive to light. Right eye exhibits no discharge. Left eye exhibits no discharge.  Neck: Normal range of motion. Neck supple. No JVD present. No tracheal deviation present. No thyromegaly present.  Cardiovascular: Normal rate, regular rhythm and normal heart sounds.   Pulmonary/Chest: No stridor. No respiratory distress. She has no wheezes.  Abdominal: Soft. Bowel sounds are normal. She exhibits no distension  and no mass. There is no tenderness. There is no rebound and no guarding.  Musculoskeletal: She exhibits no edema and no tenderness.  Lymphadenopathy:    She has no cervical adenopathy.  Neurological: She displays normal reflexes. No cranial nerve deficit. She exhibits normal muscle tone. Coordination normal.  Skin: No rash noted. No erythema.  Psychiatric: She has a normal mood and affect. Her behavior is normal. Judgment and thought content normal.   Lab Results  Component Value Date   WBC 5.8 07/08/2011   HGB 13.3 07/08/2011   HCT 39.9 07/08/2011   PLT 313.0 07/08/2011   GLUCOSE 83 09/02/2011   CHOL 149 09/02/2011   TRIG 145.0 09/02/2011   HDL 44.70 09/02/2011   LDLCALC 75 09/02/2011   ALT 32 09/02/2011   AST 35 09/02/2011   NA 142 09/02/2011   K 3.8 09/02/2011   CL 104 09/02/2011   CREATININE 0.9 09/02/2011   BUN 19 09/02/2011   CO2 31 09/02/2011   TSH 1.46 09/02/2011   HGBA1C 5.8 09/02/2011          Assessment & Plan:

## 2011-10-14 NOTE — Assessment & Plan Note (Signed)
Doing well lately 

## 2011-10-22 ENCOUNTER — Ambulatory Visit: Payer: 59 | Admitting: Adult Health

## 2011-10-24 ENCOUNTER — Other Ambulatory Visit: Payer: Self-pay | Admitting: *Deleted

## 2011-10-24 ENCOUNTER — Ambulatory Visit (INDEPENDENT_AMBULATORY_CARE_PROVIDER_SITE_OTHER): Payer: 59 | Admitting: Adult Health

## 2011-10-24 ENCOUNTER — Encounter: Payer: Self-pay | Admitting: Adult Health

## 2011-10-24 VITALS — BP 132/88 | HR 75 | Temp 97.4°F | Ht 60.0 in | Wt 145.2 lb

## 2011-10-24 DIAGNOSIS — J209 Acute bronchitis, unspecified: Secondary | ICD-10-CM | POA: Insufficient documentation

## 2011-10-24 DIAGNOSIS — J45909 Unspecified asthma, uncomplicated: Secondary | ICD-10-CM

## 2011-10-24 MED ORDER — AZITHROMYCIN 250 MG PO TABS: ORAL_TABLET | ORAL | Status: AC

## 2011-10-24 MED ORDER — METHYLPREDNISOLONE ACETATE 80 MG/ML IJ SUSP
80.0000 mg | Freq: Once | INTRAMUSCULAR | Status: AC
  Administered 2011-10-24: 80 mg via INTRAMUSCULAR

## 2011-10-24 MED ORDER — NEBIVOLOL HCL 20 MG PO TABS: 1.0000 | ORAL_TABLET | Freq: Two times a day (BID) | ORAL | Status: DC

## 2011-10-24 NOTE — Patient Instructions (Addendum)
Zpack take as directed.  Mucinex DM Twice daily As needed  Cough/congestion  Fluids and rest  Please contact office for sooner follow up if symptoms do not improve or worsen or seek emergency care   

## 2011-10-24 NOTE — Progress Notes (Addendum)
C Subjective:    Patient ID: Erasmo Score, female    DOB: 01-09-1961, 51 y.o.   MRN: [id number]  HPI 51 y.o. AAF  female with a history of sarcoidosis Stage II, and allergic rhinitis   10/24/2011 Acute OV  Complains of  prod cough for 6 days - Soreness across back - Worse at night - Mild sinus drainage after using neti pot - SOB with increased walking -  Denies wheezing  Or fever.  Leaving on trip in am .  Using robitussium with some help.  No orthopnea or edema.     Review of Systems Constitutional:   No  weight loss, night sweats,  Fevers, chills, fatigue, or  lassitude.  HEENT:   No headaches,  Difficulty swallowing,  Tooth/dental problems, or  Sore throat,                No sneezing, itching, ear ache, + nasal congestion, post nasal drip,   CV:  No chest pain,  Orthopnea, PND, swelling in lower extremities, anasarca, dizziness, palpitations, syncope.   GI  No heartburn, indigestion, abdominal pain, nausea, vomiting, diarrhea, change in bowel habits, loss of appetite, bloody stools.   Resp:  ,  No coughing up of blood.     No chest wall deformity  Skin: no rash or lesions.  GU: no dysuria, change in color of urine, no urgency or frequency.  No flank pain, no hematuria   MS:  No joint pain or swelling.  No decreased range of motion.  No back pain.  Psych:  No change in mood or affect. No depression or anxiety.  No memory loss.         Objective:   Physical Exam GEN: A/Ox3; pleasant , NAD, well nourished   HEENT:  [place]/AT,  EACs-clear, TMs-wnl, NOSE-mod turbinate edema,  THROAT-clear, no lesions, no postnasal drip or exudate noted.   NECK:  Supple w/ fair ROM; no JVD; normal carotid impulses w/o bruits; no thyromegaly or nodules palpated; no lymphadenopathy.  RESP  Coarse BS , wheezes/ rales/ or rhonchi.no accessory muscle use, no dullness to percussion  CARD:  RRR, no m/r/g  , no peripheral edema, pulses intact, no cyanosis or clubbing.  GI:   Soft & nt; nml  bowel sounds; no organomegaly or masses detected.  Musco: Warm bil, no deformities or joint swelling noted.   Neuro: alert, no focal deficits noted.    Skin: Warm, no lesions or rashes         Assessment & Plan:

## 2011-10-24 NOTE — Assessment & Plan Note (Signed)
URI /AR flare vs early bronchitis  Plan   Zpack take as directed  Mucinex DM Twice daily   Fluids and rest  Follow up 1 month with Dr. Delford Field

## 2011-11-20 ENCOUNTER — Telehealth: Payer: Self-pay | Admitting: Critical Care Medicine

## 2011-11-20 NOTE — Telephone Encounter (Signed)
lmtcb

## 2011-11-21 NOTE — Telephone Encounter (Signed)
She can do this type of work, her lung condition will not prohibit this work

## 2011-11-21 NOTE — Telephone Encounter (Signed)
Pt advised. Yandriel Boening, CMA  

## 2011-11-21 NOTE — Telephone Encounter (Signed)
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 Delford Field thinks this is okay for her to do given her pulmonary issues. If it is not okay, she is going to need a letter stating that she should not do this for her employer. Pls advise.

## 2011-11-21 NOTE — Telephone Encounter (Signed)
[email or web address].Jennifer Castillo, CMA  

## 2011-12-02 ENCOUNTER — Encounter: Payer: Self-pay | Admitting: Critical Care Medicine

## 2011-12-02 ENCOUNTER — Ambulatory Visit (INDEPENDENT_AMBULATORY_CARE_PROVIDER_SITE_OTHER): Payer: 59 | Admitting: Critical Care Medicine

## 2011-12-02 VITALS — BP 120/76 | HR 65 | Temp 98.3°F | Ht 60.0 in | Wt 145.8 lb

## 2011-12-02 DIAGNOSIS — D860 Sarcoidosis of lung: Secondary | ICD-10-CM

## 2011-12-02 DIAGNOSIS — D869 Sarcoidosis, unspecified: Secondary | ICD-10-CM

## 2011-12-02 DIAGNOSIS — J45909 Unspecified asthma, uncomplicated: Secondary | ICD-10-CM

## 2011-12-02 DIAGNOSIS — J99 Respiratory disorders in diseases classified elsewhere: Secondary | ICD-10-CM

## 2011-12-02 NOTE — Patient Instructions (Signed)
No change in medications. Return in        6 months        

## 2011-12-02 NOTE — Progress Notes (Signed)
C Subjective:    Patient ID: Erasmo Score, female    DOB: 23-May-1960, 51 y.o.   MRN: [id number]  HPI  51 y.o. AAF  female with a history of sarcoidosis Stage II, and allergic rhinitis   10/10  Acute OV  Complains of  prod cough for 6 days - Soreness across back - Worse at night - Mild sinus drainage after using neti pot - SOB with increased walking -  Denies wheezing  Or fever.  Leaving on trip in am .  Using robitussium with some help.  No orthopnea or edema.   12/02/2011 No new issues. Pt is better with Zpak Pt denies any significant sore throat, nasal congestion or excess secretions, fever, chills, sweats, unintended weight loss, pleurtic or exertional chest pain, orthopnea PND, or leg swelling Pt denies any increase in rescue therapy over baseline, denies waking up needing it or having any early am or nocturnal exacerbations of coughing/wheezing/or dyspnea. Pt also denies any obvious fluctuation in symptoms with  weather or environmental change or other alleviating or aggravating factors      Review of Systems  Constitutional:   No  weight loss, night sweats,  Fevers, chills, fatigue, or  lassitude.  HEENT:   No headaches,  Difficulty swallowing,  Tooth/dental problems, or  Sore throat,                No sneezing, itching, ear ache, + nasal congestion, post nasal drip,   CV:  No chest pain,  Orthopnea, PND, swelling in lower extremities, anasarca, dizziness, palpitations, syncope.   GI  No heartburn, indigestion, abdominal pain, nausea, vomiting, diarrhea, change in bowel habits, loss of appetite, bloody stools.   Resp:  ,  No coughing up of blood.     No chest wall deformity  Skin: no rash or lesions.  GU: no dysuria, change in color of urine, no urgency or frequency.  No flank pain, no hematuria   MS:  No joint pain or swelling.  No decreased range of motion.  No back pain.  Psych:  No change in mood or affect. No depression or anxiety.  No memory loss.         Objective:   Physical Exam BP 120/76  Pulse 65  Temp 98.3 F (36.8 C) (Oral)  Ht 5' (1.524 m)  Wt 145 lb 12.8 oz (66.134 kg)  BMI 28.47 kg/m2  SpO2 97%  GEN: A/Ox3; pleasant , NAD, well nourished   HEENT:  [place]/AT,  EACs-clear, TMs-wnl, NOSE-mod turbinate edema,  THROAT-clear, no lesions, no postnasal drip or exudate noted.   NECK:  Supple w/ fair ROM; no JVD; normal carotid impulses w/o bruits; no thyromegaly or nodules palpated; no lymphadenopathy.  RESP  Coarse BS , no wheezes/ rales/ or rhonchi.no accessory muscle use, no dullness to percussion  CARD:  RRR, no m/r/g  , no peripheral edema, pulses intact, no cyanosis or clubbing.  GI:   Soft & nt; nml bowel sounds; no organomegaly or masses detected.  Musco: Warm bil, no deformities or joint swelling noted.   Neuro: alert, no focal deficits noted.    Skin: Warm, no lesions or rashes         Assessment & Plan:   ASTHMA Chronic persistent asthma with recent acute bronchitis flare now improved Plan No additional antibiotics indicated No need for additional inhaled medications  SARCOIDOSIS, PULMONARY Pulmonary sarcoidosis with persistent remission Plan Observation for now   Updated Medication List Outpatient Encounter Prescriptions as of  12/02/2011  Medication Sig Dispense Refill  . acyclovir (ZOVIRAX) 5 % cream Apply topically as needed.        [name] amLODipine (NORVASC) 5 MG tablet take 1/2 to 1 tablet by mouth once daily  30 tablet  5  . Calcium-Magnesium-Vitamin D (SUPER CAL-MAG-D) 500-250-125 MG-MG-UNIT TABS Take by mouth. 1-2 daily      . Dorzolamide HCl-Timolol Mal (COSOPT OP) Apply to eye. 1 drop two times a day in left eye       . fexofenadine (ALLEGRA) 180 MG tablet Take 1 tablet (180 mg total) by mouth daily.  90 tablet  3  . Hypertonic Nasal Wash (NASAFLO NETI POT NASAL WASH NA) by Nasal route.        [name] losartan (COZAAR) 100 MG tablet take 1 tablet by mouth once daily  30 tablet  5  . Multiple  Vitamins-Minerals (HAIR/SKIN/NAILS) TABS Take by mouth 2 (two) times daily.       . Nebivolol HCl 20 MG TABS Take 0.5 tablets by mouth daily.      . potassium bicarbonate (K-VESCENT) 25 MEQ disintegrating tablet       . Pyridoxine HCl (B-6) 100 MG TABS Take 2 tablets by mouth daily.       . valACYclovir (VALTREX) 1000 MG tablet Take 500 mg by mouth 2 (two) times daily. 1/2 tablet two times a day      . [DISCONTINUED] K-VESCENT 25 MEQ disintegrating tablet DISSOLVE & TAKE ONE TABLET BY MOUTH TWICE DAILY  60 each  0  . [DISCONTINUED] fluconazole (DIFLUCAN) 150 MG tablet 1 po qd x1, may repeat in 3 days prn  2 tablet  2

## 2011-12-02 NOTE — Assessment & Plan Note (Signed)
Pulmonary sarcoidosis with persistent remission Plan Observation for now

## 2011-12-02 NOTE — Assessment & Plan Note (Signed)
Chronic persistent asthma with recent acute bronchitis flare now improved Plan No additional antibiotics indicated No need for additional inhaled medications

## 2011-12-26 ENCOUNTER — Other Ambulatory Visit: Payer: Self-pay | Admitting: *Deleted

## 2011-12-26 MED ORDER — FEXOFENADINE HCL 180 MG PO TABS: 180.0000 mg | ORAL_TABLET | Freq: Every day | ORAL | Status: DC

## 2011-12-26 NOTE — Telephone Encounter (Signed)
Pt requesting refill of Allegra-has contacted pharmacy but hasn't heard anything from office-rx sent to [facility], pt informed via VM and to callback office with any questions/concerns.

## 2012-01-04 ENCOUNTER — Other Ambulatory Visit: Payer: Self-pay | Admitting: Internal Medicine

## 2012-01-07 ENCOUNTER — Encounter: Payer: Self-pay | Admitting: Internal Medicine

## 2012-01-07 ENCOUNTER — Ambulatory Visit (INDEPENDENT_AMBULATORY_CARE_PROVIDER_SITE_OTHER): Payer: 59 | Admitting: Internal Medicine

## 2012-01-07 VITALS — BP 120/90 | HR 76 | Temp 98.4°F | Resp 16 | Wt 146.0 lb

## 2012-01-07 DIAGNOSIS — J309 Allergic rhinitis, unspecified: Secondary | ICD-10-CM

## 2012-01-07 DIAGNOSIS — J069 Acute upper respiratory infection, unspecified: Secondary | ICD-10-CM | POA: Insufficient documentation

## 2012-01-07 MED ORDER — AZITHROMYCIN 250 MG PO TABS: ORAL_TABLET | ORAL | Status: DC

## 2012-01-07 MED ORDER — METHYLPREDNISOLONE ACETATE 80 MG/ML IJ SUSP
120.0000 mg | Freq: Once | INTRAMUSCULAR | Status: AC
  Administered 2012-01-07: 120 mg via INTRAMUSCULAR

## 2012-01-07 NOTE — Assessment & Plan Note (Signed)
Depomedrol 120 mg im

## 2012-01-07 NOTE — Progress Notes (Signed)
Patient ID: [name], female   DOB: [birth year], 51 y.o.   MRN: [id number]   Subjective:   Patient ID: [name], female    DOB: December 19, 1960, 51 y.o.   MRN: [id number]  HPI  C/o congestion x 1-2 d C/o dizziness at times; stress  The patient presents for a follow-up of  chronic hypertension, chronic dyslipidemia, sarcoid, allergies  controlled with medicines      Review of Systems  Constitutional: Negative for chills, activity change, appetite change, fatigue and unexpected weight change.  HENT: Negative for congestion, mouth sores and sinus pressure.   Eyes: Negative for visual disturbance.  Respiratory: Negative for cough and chest tightness.   Gastrointestinal: Negative for nausea and abdominal pain.  Genitourinary: Negative for frequency, difficulty urinating and vaginal pain.  Musculoskeletal: Negative for back pain and gait problem.  Skin: Negative for pallor and rash.  Neurological: Negative for dizziness, tremors, weakness, numbness and headaches.  Psychiatric/Behavioral: Negative for confusion and sleep disturbance.   Wt Readings from Last 3 Encounters:  01/07/12 146 lb (66.225 kg)  12/02/11 145 lb 12.8 oz (66.134 kg)  10/24/11 145 lb 3.2 oz (65.862 kg)    BP Readings from Last 3 Encounters:  01/07/12 120/90  12/02/11 120/76  10/24/11 132/88        Objective:   Physical Exam  Constitutional: She appears well-developed and well-nourished. No distress.  HENT:  Head: Normocephalic.  Right Ear: External ear normal.  Left Ear: External ear normal.  Nose: Nose normal.  Mouth/Throat: Oropharynx is clear and moist.  Eyes: Conjunctivae normal are normal. Pupils are equal, round, and reactive to light. Right eye exhibits no discharge. Left eye exhibits no discharge.  Neck: Normal range of motion. Neck supple. No JVD present. No tracheal deviation present. No thyromegaly present.  Cardiovascular: Normal rate, regular rhythm and normal heart sounds.    Pulmonary/Chest: No stridor. No respiratory distress. She has no wheezes.  Abdominal: Soft. Bowel sounds are normal. She exhibits no distension and no mass. There is no tenderness. There is no rebound and no guarding.  Musculoskeletal: She exhibits no edema and no tenderness.  Lymphadenopathy:    She has no cervical adenopathy.  Neurological: She displays normal reflexes. No cranial nerve deficit. She exhibits normal muscle tone. Coordination normal.  Skin: No rash noted. No erythema.  Psychiatric: She has a normal mood and affect. Her behavior is normal. Judgment and thought content normal.   Lab Results  Component Value Date   WBC 5.8 07/08/2011   HGB 13.3 07/08/2011   HCT 39.9 07/08/2011   PLT 313.0 07/08/2011   GLUCOSE 83 09/02/2011   CHOL 149 09/02/2011   TRIG 145.0 09/02/2011   HDL 44.70 09/02/2011   LDLCALC 75 09/02/2011   ALT 32 09/02/2011   AST 35 09/02/2011   NA 142 09/02/2011   K 3.8 09/02/2011   CL 104 09/02/2011   CREATININE 0.9 09/02/2011   BUN 19 09/02/2011   CO2 31 09/02/2011   TSH 1.46 09/02/2011   HGBA1C 5.8 09/02/2011          Assessment & Plan:

## 2012-01-07 NOTE — Assessment & Plan Note (Signed)
Poss viral OTC meds

## 2012-01-11 ENCOUNTER — Encounter: Payer: Self-pay | Admitting: Internal Medicine

## 2012-01-14 ENCOUNTER — Encounter: Payer: Self-pay | Admitting: Internal Medicine

## 2012-01-14 ENCOUNTER — Ambulatory Visit (INDEPENDENT_AMBULATORY_CARE_PROVIDER_SITE_OTHER): Payer: 59 | Admitting: Internal Medicine

## 2012-01-14 ENCOUNTER — Other Ambulatory Visit (INDEPENDENT_AMBULATORY_CARE_PROVIDER_SITE_OTHER): Payer: 59

## 2012-01-14 VITALS — BP 130/90 | HR 63 | Temp 98.2°F | Ht 60.0 in | Wt 144.8 lb

## 2012-01-14 DIAGNOSIS — J309 Allergic rhinitis, unspecified: Secondary | ICD-10-CM

## 2012-01-14 DIAGNOSIS — R5383 Other fatigue: Secondary | ICD-10-CM

## 2012-01-14 DIAGNOSIS — F329 Major depressive disorder, single episode, unspecified: Secondary | ICD-10-CM

## 2012-01-14 DIAGNOSIS — R739 Hyperglycemia, unspecified: Secondary | ICD-10-CM

## 2012-01-14 DIAGNOSIS — F411 Generalized anxiety disorder: Secondary | ICD-10-CM

## 2012-01-14 DIAGNOSIS — J45909 Unspecified asthma, uncomplicated: Secondary | ICD-10-CM

## 2012-01-14 DIAGNOSIS — R5381 Other malaise: Secondary | ICD-10-CM

## 2012-01-14 DIAGNOSIS — I10 Essential (primary) hypertension: Secondary | ICD-10-CM

## 2012-01-14 DIAGNOSIS — R42 Dizziness and giddiness: Secondary | ICD-10-CM

## 2012-01-14 DIAGNOSIS — R7309 Other abnormal glucose: Secondary | ICD-10-CM

## 2012-01-14 LAB — BASIC METABOLIC PANEL
BUN: 21 mg/dL (ref 6–23)
CO2: 29 mEq/L (ref 19–32)
Chloride: 105 mEq/L (ref 96–112)
Creatinine, Ser: 1.1 mg/dL (ref 0.4–1.2)

## 2012-01-14 MED ORDER — MECLIZINE HCL 12.5 MG PO TABS: 12.5000 mg | ORAL_TABLET | Freq: Three times a day (TID) | ORAL | Status: DC | PRN

## 2012-01-14 NOTE — Progress Notes (Signed)
   Subjective:   Patient ID: [name], female    DOB: 1961-01-09, 51 y.o.   MRN: [id number]  Hypertension Pertinent negatives include no headaches.    C/o congestion x 7 d - better C/o dizziness at times - worse; stress  The patient presents for a follow-up of  chronic hypertension, chronic dyslipidemia, sarcoid, allergies  controlled with medicines      Review of Systems  Constitutional: Negative for chills, activity change, appetite change, fatigue and unexpected weight change.  HENT: Negative for congestion, mouth sores and sinus pressure.   Eyes: Negative for visual disturbance.  Respiratory: Negative for cough and chest tightness.   Gastrointestinal: Negative for nausea and abdominal pain.  Genitourinary: Negative for frequency, difficulty urinating and vaginal pain.  Musculoskeletal: Negative for back pain and gait problem.  Skin: Negative for pallor and rash.  Neurological: Negative for dizziness, tremors, weakness, numbness and headaches.  Psychiatric/Behavioral: Negative for confusion and sleep disturbance.   Wt Readings from Last 3 Encounters:  01/14/12 144 lb 12 oz (65.658 kg)  01/07/12 146 lb (66.225 kg)  12/02/11 145 lb 12.8 oz (66.134 kg)    BP Readings from Last 3 Encounters:  01/14/12 130/90  01/07/12 120/90  12/02/11 120/76        Objective:   Physical Exam  Constitutional: She appears well-developed and well-nourished. No distress.  HENT:  Head: Normocephalic.  Right Ear: External ear normal.  Left Ear: External ear normal.  Nose: Nose normal.  Mouth/Throat: Oropharynx is clear and moist.  Eyes: Conjunctivae normal are normal. Pupils are equal, round, and reactive to light. Right eye exhibits no discharge. Left eye exhibits no discharge.  Neck: Normal range of motion. Neck supple. No JVD present. No tracheal deviation present. No thyromegaly present.  Cardiovascular: Normal rate, regular rhythm and normal heart sounds.   Pulmonary/Chest:  No stridor. No respiratory distress. She has no wheezes.  Abdominal: Soft. Bowel sounds are normal. She exhibits no distension and no mass. There is no tenderness. There is no rebound and no guarding.  Musculoskeletal: She exhibits no edema and no tenderness.  Lymphadenopathy:    She has no cervical adenopathy.  Neurological: She displays normal reflexes. No cranial nerve deficit. She exhibits normal muscle tone. Coordination normal.  Skin: No rash noted. No erythema.  Psychiatric: She has a normal mood and affect. Her behavior is normal. Judgment and thought content normal.  H-P (-) today Not ataxic Lab Results  Component Value Date   WBC 5.8 07/08/2011   HGB 13.3 07/08/2011   HCT 39.9 07/08/2011   PLT 313.0 07/08/2011   GLUCOSE 83 09/02/2011   CHOL 149 09/02/2011   TRIG 145.0 09/02/2011   HDL 44.70 09/02/2011   LDLCALC 75 09/02/2011   ALT 32 09/02/2011   AST 35 09/02/2011   NA 142 09/02/2011   K 3.8 09/02/2011   CL 104 09/02/2011   CREATININE 0.9 09/02/2011   BUN 19 09/02/2011   CO2 31 09/02/2011   TSH 1.46 09/02/2011   HGBA1C 5.8 09/02/2011          Assessment & Plan:

## 2012-01-14 NOTE — Assessment & Plan Note (Signed)
Continue with current prescription therapy as reflected on the Med list.  

## 2012-01-14 NOTE — Assessment & Plan Note (Signed)
Benign Positional Vertigo symptoms Start Meclizine. Start Brandt - Daroff exercise several times a day as dirrected.  

## 2012-01-14 NOTE — Assessment & Plan Note (Signed)
Better  

## 2012-01-14 NOTE — Assessment & Plan Note (Signed)
Rechecked BP 125/85 Continue with current prescription therapy as reflected on the Med list.

## 2012-01-20 ENCOUNTER — Ambulatory Visit: Payer: 59 | Admitting: Internal Medicine

## 2012-02-29 ENCOUNTER — Encounter: Payer: Self-pay | Admitting: Internal Medicine

## 2012-02-29 ENCOUNTER — Ambulatory Visit (INDEPENDENT_AMBULATORY_CARE_PROVIDER_SITE_OTHER): Payer: 59 | Admitting: Internal Medicine

## 2012-02-29 VITALS — BP 128/80 | HR 70 | Temp 97.4°F | Wt 144.0 lb

## 2012-02-29 DIAGNOSIS — J309 Allergic rhinitis, unspecified: Secondary | ICD-10-CM

## 2012-02-29 DIAGNOSIS — J069 Acute upper respiratory infection, unspecified: Secondary | ICD-10-CM

## 2012-02-29 DIAGNOSIS — J45909 Unspecified asthma, uncomplicated: Secondary | ICD-10-CM

## 2012-02-29 MED ORDER — AZITHROMYCIN 250 MG PO TABS: ORAL_TABLET | ORAL | Status: DC

## 2012-02-29 MED ORDER — FEXOFENADINE HCL 180 MG PO TABS: 180.0000 mg | ORAL_TABLET | Freq: Every day | ORAL | Status: DC

## 2012-02-29 NOTE — Progress Notes (Signed)
   Subjective:    HPI  C/o URI sx's, congestion and cough x 3-4 d, not better w/OTC meds C/o dizziness at times; stress  The patient presents for a follow-up of  chronic asthme, allergies      Review of Systems  Constitutional: Negative for chills, activity change, appetite change, fatigue and unexpected weight change.  HENT: Negative for congestion, mouth sores and sinus pressure.   Eyes: Negative for visual disturbance.  Respiratory: Negative for cough and chest tightness.   Gastrointestinal: Negative for nausea and abdominal pain.  Genitourinary: Negative for frequency, difficulty urinating and vaginal pain.  Musculoskeletal: Negative for back pain and gait problem.  Skin: Negative for pallor and rash.  Neurological: Negative for dizziness, tremors, weakness, numbness and headaches.  Psychiatric/Behavioral: Negative for confusion and sleep disturbance.   Wt Readings from Last 3 Encounters:  02/29/12 144 lb (65.318 kg)  01/14/12 144 lb 12 oz (65.658 kg)  01/07/12 146 lb (66.225 kg)    BP Readings from Last 3 Encounters:  02/29/12 128/80  01/14/12 130/90  01/07/12 120/90        Objective:   Physical Exam  Constitutional: She appears well-developed and well-nourished. No distress.  HENT:  Head: Normocephalic.  Right Ear: External ear normal.  Left Ear: External ear normal.  Nose: Nose normal.  eryth throat  Eyes: Conjunctivae are normal. Pupils are equal, round, and reactive to light. Right eye exhibits no discharge. Left eye exhibits no discharge.  Neck: Normal range of motion. Neck supple. No JVD present. No tracheal deviation present. No thyromegaly present.  Cardiovascular: Normal rate, regular rhythm and normal heart sounds.   Pulmonary/Chest: No stridor. No respiratory distress. She has no wheezes.  Abdominal: Soft. Bowel sounds are normal. She exhibits no distension and no mass. There is no tenderness. There is no rebound and no guarding.   Musculoskeletal: She exhibits no edema and no tenderness.  Lymphadenopathy:    She has no cervical adenopathy.  Neurological: She displays normal reflexes. No cranial nerve deficit. She exhibits normal muscle tone. Coordination normal.  Skin: No rash noted. No erythema.  Psychiatric: She has a normal mood and affect. Her behavior is normal. Judgment and thought content normal.   Lab Results  Component Value Date   WBC 5.8 07/08/2011   HGB 13.3 07/08/2011   HCT 39.9 07/08/2011   PLT 313.0 07/08/2011   GLUCOSE 96 01/14/2012   CHOL 149 09/02/2011   TRIG 145.0 09/02/2011   HDL 44.70 09/02/2011   LDLCALC 75 09/02/2011   ALT 32 09/02/2011   AST 35 09/02/2011   NA 142 01/14/2012   K 4.0 01/14/2012   CL 105 01/14/2012   CREATININE 1.1 01/14/2012   BUN 21 01/14/2012   CO2 29 01/14/2012   TSH 1.46 09/02/2011   HGBA1C 6.2 01/14/2012          Assessment & Plan:

## 2012-02-29 NOTE — Assessment & Plan Note (Addendum)
Zpac if worse Flu test

## 2012-02-29 NOTE — Assessment & Plan Note (Signed)
Stable

## 2012-02-29 NOTE — Assessment & Plan Note (Signed)
Allegra is too $$$ Other options discussed

## 2012-03-06 ENCOUNTER — Other Ambulatory Visit: Payer: Self-pay | Admitting: Internal Medicine

## 2012-03-06 NOTE — Telephone Encounter (Signed)
Patient Information:  Caller Name: Alajia  Phone: [phone]  Patient: Brandin, Dilday  Gender: Female  DOB: [birth year]  Age: 52 Years  PCP: Plotnikov, Alex (Adults only)  Pregnant: No  Office Follow Up:  Does the office need to follow up with this patient?: Yes  Instructions For The Office: Use [facility] in [place] please.   Symptoms  Reason For Call & Symptoms: Patient calling for a refill on the antibiotic that she was given last Saturday, 2/15.  She is feeling better but still has green mucous.  Reviewed Health History In EMR: Yes  Reviewed Medications In EMR: Yes  Reviewed Allergies In EMR: Yes  Reviewed Surgeries / Procedures: Yes  Date of Onset of Symptoms: 02/25/2012  Treatments Tried: Zpac  Treatments Tried Worked: No OB / GYN:  LMP: Unknown  Guideline(s) Used:  Cough  Disposition Per Guideline:   See Within 3 Days in Office  Reason For Disposition Reached:   Cough has been present for > 10 days  Advice Given:  N/A  RN Overrode Recommendation:  Patient Requests Prescription  Asking for additional medication?  Should she be rechecked  tomorrow at [facility] with her hx of sarcodosis?

## 2012-03-17 ENCOUNTER — Other Ambulatory Visit: Payer: Self-pay | Admitting: Internal Medicine

## 2012-04-10 ENCOUNTER — Ambulatory Visit: Payer: 59 | Admitting: Critical Care Medicine

## 2012-04-13 ENCOUNTER — Ambulatory Visit: Payer: 59 | Admitting: Internal Medicine

## 2012-04-27 ENCOUNTER — Encounter: Payer: Self-pay | Admitting: Internal Medicine

## 2012-04-27 ENCOUNTER — Ambulatory Visit (INDEPENDENT_AMBULATORY_CARE_PROVIDER_SITE_OTHER): Payer: 59 | Admitting: Internal Medicine

## 2012-04-27 ENCOUNTER — Other Ambulatory Visit (INDEPENDENT_AMBULATORY_CARE_PROVIDER_SITE_OTHER): Payer: 59

## 2012-04-27 VITALS — BP 130/80 | HR 72 | Temp 98.1°F | Resp 16 | Wt 146.0 lb

## 2012-04-27 DIAGNOSIS — F329 Major depressive disorder, single episode, unspecified: Secondary | ICD-10-CM

## 2012-04-27 DIAGNOSIS — J45909 Unspecified asthma, uncomplicated: Secondary | ICD-10-CM

## 2012-04-27 DIAGNOSIS — B351 Tinea unguium: Secondary | ICD-10-CM | POA: Insufficient documentation

## 2012-04-27 DIAGNOSIS — I10 Essential (primary) hypertension: Secondary | ICD-10-CM

## 2012-04-27 DIAGNOSIS — R5381 Other malaise: Secondary | ICD-10-CM

## 2012-04-27 DIAGNOSIS — R5383 Other fatigue: Secondary | ICD-10-CM

## 2012-04-27 DIAGNOSIS — F411 Generalized anxiety disorder: Secondary | ICD-10-CM

## 2012-04-27 LAB — BASIC METABOLIC PANEL
CO2: 30 mEq/L (ref 19–32)
Chloride: 102 mEq/L (ref 96–112)
Creatinine, Ser: 1 mg/dL (ref 0.4–1.2)
Potassium: 4 mEq/L (ref 3.5–5.1)
Sodium: 141 mEq/L (ref 135–145)

## 2012-04-27 MED ORDER — FLUCONAZOLE 150 MG PO TABS: 150.0000 mg | ORAL_TABLET | Freq: Once | ORAL | Status: DC

## 2012-04-27 MED ORDER — CICLOPIROX 8 % EX SOLN: Freq: Every day | CUTANEOUS | Status: DC

## 2012-04-27 NOTE — Assessment & Plan Note (Signed)
Continue with current prescription therapy as reflected on the Med list.  

## 2012-04-27 NOTE — Progress Notes (Signed)
   Subjective:    HPI  C/o toenail discoloration x weeks C/o dizziness at times; stress  The patient presents for a follow-up of  chronic asthma, allergies      Review of Systems  Constitutional: Negative for chills, activity change, appetite change, fatigue and unexpected weight change.  HENT: Negative for congestion, mouth sores and sinus pressure.   Eyes: Negative for visual disturbance.  Respiratory: Negative for cough and chest tightness.   Gastrointestinal: Negative for nausea and abdominal pain.  Genitourinary: Negative for frequency, difficulty urinating and vaginal pain.  Musculoskeletal: Negative for back pain and gait problem.  Skin: Negative for pallor and rash.  Neurological: Negative for dizziness, tremors, weakness, numbness and headaches.  Psychiatric/Behavioral: Negative for confusion and sleep disturbance.   Wt Readings from Last 3 Encounters:  04/27/12 146 lb (66.225 kg)  02/29/12 144 lb (65.318 kg)  01/14/12 144 lb 12 oz (65.658 kg)    BP Readings from Last 3 Encounters:  04/27/12 130/90  02/29/12 128/80  01/14/12 130/90        Objective:   Physical Exam  Constitutional: She appears well-developed and well-nourished. No distress.  HENT:  Head: Normocephalic.  Right Ear: External ear normal.  Left Ear: External ear normal.  Nose: Nose normal.  eryth throat  Eyes: Conjunctivae are normal. Pupils are equal, round, and reactive to light. Right eye exhibits no discharge. Left eye exhibits no discharge.  Neck: Normal range of motion. Neck supple. No JVD present. No tracheal deviation present. No thyromegaly present.  Cardiovascular: Normal rate, regular rhythm and normal heart sounds.   Pulmonary/Chest: No stridor. No respiratory distress. She has no wheezes.  Abdominal: Soft. Bowel sounds are normal. She exhibits no distension and no mass. There is no tenderness. There is no rebound and no guarding.  Musculoskeletal: She exhibits no edema and no  tenderness.  Lymphadenopathy:    She has no cervical adenopathy.  Neurological: She displays normal reflexes. No cranial nerve deficit. She exhibits normal muscle tone. Coordination normal.  Skin: No rash noted. No erythema.  Psychiatric: She has a normal mood and affect. Her behavior is normal. Judgment and thought content normal.  onycho x1 neck muscles are tender L>R  Lab Results  Component Value Date   WBC 5.8 07/08/2011   HGB 13.3 07/08/2011   HCT 39.9 07/08/2011   PLT 313.0 07/08/2011   GLUCOSE 96 01/14/2012   CHOL 149 09/02/2011   TRIG 145.0 09/02/2011   HDL 44.70 09/02/2011   LDLCALC 75 09/02/2011   ALT 32 09/02/2011   AST 35 09/02/2011   NA 142 01/14/2012   K 4.0 01/14/2012   CL 105 01/14/2012   CREATININE 1.1 01/14/2012   BUN 21 01/14/2012   CO2 29 01/14/2012   TSH 1.46 09/02/2011   HGBA1C 6.2 01/14/2012          Assessment & Plan:

## 2012-04-27 NOTE — Assessment & Plan Note (Signed)
Doing better.   

## 2012-04-27 NOTE — Assessment & Plan Note (Signed)
Stress is better ?

## 2012-04-27 NOTE — Assessment & Plan Note (Signed)
[place]

## 2012-06-02 ENCOUNTER — Ambulatory Visit (INDEPENDENT_AMBULATORY_CARE_PROVIDER_SITE_OTHER): Payer: 59 | Admitting: Critical Care Medicine

## 2012-06-02 ENCOUNTER — Ambulatory Visit (INDEPENDENT_AMBULATORY_CARE_PROVIDER_SITE_OTHER)
Admission: RE | Admit: 2012-06-02 | Discharge: 2012-06-02 | Disposition: A | Discharge status: Home / Self Care | Payer: 59 | Source: Ambulatory Visit | Attending: Critical Care Medicine | Admitting: Critical Care Medicine

## 2012-06-02 ENCOUNTER — Encounter: Payer: Self-pay | Admitting: Critical Care Medicine

## 2012-06-02 VITALS — BP 130/84 | HR 67 | Temp 98.2°F | Ht 60.0 in | Wt 150.6 lb

## 2012-06-02 DIAGNOSIS — D869 Sarcoidosis, unspecified: Secondary | ICD-10-CM

## 2012-06-02 NOTE — Patient Instructions (Addendum)
A chest xray will be obtained Pulmonary functions will be obtained Return 6 months

## 2012-06-02 NOTE — Progress Notes (Signed)
C Subjective:    Patient ID: Erasmo Score, female    DOB: 1960-03-10, 52 y.o.   MRN: [id number]  HPI  52 y.o.  AAF  female with a history of sarcoidosis Stage II, and allergic rhinitis   06/02/2012 Pt is at baseline.  No real cough.  Pt denies any significant sore throat, nasal congestion or excess secretions, fever, chills, sweats, unintended weight loss, pleurtic or exertional chest pain, orthopnea PND, or leg swelling Pt denies any increase in rescue therapy over baseline, denies waking up needing it or having any early am or nocturnal exacerbations of coughing/wheezing/or dyspnea. Pt also denies any obvious fluctuation in symptoms with  weather or environmental change or other alleviating or aggravating factors  Review of Systems  Constitutional:   No  weight loss, night sweats,  Fevers, chills, fatigue, or  lassitude.  HEENT:   No headaches,  Difficulty swallowing,  Tooth/dental problems, or  Sore throat,                No sneezing, itching, ear ache, no nasal congestion, post nasal drip,   CV:  No chest pain,  Orthopnea, PND, swelling in lower extremities, anasarca, dizziness, palpitations, syncope.   GI  No heartburn, indigestion, abdominal pain, nausea, vomiting, diarrhea, change in bowel habits, loss of appetite, bloody stools.   Resp:  ,  No coughing up of blood.     No chest wall deformity  Skin: no rash or lesions.  GU: no dysuria, change in color of urine, no urgency or frequency.  No flank pain, no hematuria   MS:  No joint pain or swelling.  No decreased range of motion.  No back pain.  Psych:  No change in mood or affect. No depression or anxiety.  No memory loss.     Objective:   Physical Exam BP 130/84  Pulse 67  Temp(Src) 98.2 F (36.8 C) (Oral)  Ht 5' (1.524 m)  Wt 150 lb 9.6 oz (68.312 kg)  BMI 29.41 kg/m2  SpO2 97%  GEN: A/Ox3; pleasant , NAD, well nourished   HEENT:  [place]/AT,  EACs-clear, TMs-wnl, NOSE-mod turbinate edema,  THROAT-clear, no  lesions, no postnasal drip or exudate noted.   NECK:  Supple w/ fair ROM; no JVD; normal carotid impulses w/o bruits; no thyromegaly or nodules palpated; no lymphadenopathy.  RESP  Coarse BS , no wheezes/ rales/ or rhonchi.no accessory muscle use, no dullness to percussion  CARD:  RRR, no m/r/g  , no peripheral edema, pulses intact, no cyanosis or clubbing.  GI:   Soft & nt; nml bowel sounds; no organomegaly or masses detected.  Musco: Warm bil, no deformities or joint swelling noted.   Neuro: alert, no focal deficits noted.    Skin: Warm, no lesions or rashes  Dg Chest 2 View  06/02/2012   *RADIOLOGY REPORT*  Clinical Data: Sarcoidosis, follow-up  CHEST - 2 VIEW  Comparison: Chest x-ray of 06/19/2013and 09/04/2009  Findings: There has been worsening of coarse interstitial markings primarily involving the upper lobes right greater than left in this patient with history of sarcoidosis.  An active process would be difficult to exclude.  No effusion is seen.  Mediastinal contours are stable.  The heart is within normal limits in size.  No bony abnormality is seen.  IMPRESSION: Worsening of coarse interstitial markings primarily in the upper lobes in this patient with sarcoidosis.  An active process cannot be excluded.   Original Report Authenticated By: Dwyane Dee, M.D.  Assessment & Plan:   SARCOIDOSIS, PULMONARY Pulmonary sarcoidosis with evidence on chest x-ray of progression in both upper lung zones with now parenchymal involvement The patient appears to be relatively asymptomatic despite x-ray changes Plan Obtain CT scan of the chest high resolution without contrast Obtain full set of pulmonary function studies No change in medications at this time    Updated Medication List Outpatient Encounter Prescriptions as of 06/02/2012  Medication Sig Dispense Refill  . acyclovir (ZOVIRAX) 5 % cream Apply topically as needed.        [name] amLODipine (NORVASC) 5 MG tablet take 1/2  to 1 tablet by mouth once daily  30 tablet  5  . Calcium-Magnesium-Vitamin D (SUPER CAL-MAG-D) 500-250-125 MG-MG-UNIT TABS Take by mouth. 1-2 daily      . Dorzolamide HCl-Timolol Mal (COSOPT OP) Apply to eye. 1 drop two times a day in left eye       . fexofenadine (ALLEGRA) 180 MG tablet Take 90 mg by mouth daily as needed.      . fluticasone (FLONASE) 50 MCG/ACT nasal spray Place 2 sprays into the nose daily.      . Hypertonic Nasal Wash (NASAFLO NETI POT NASAL WASH NA) by Nasal route.        [name] losartan (COZAAR) 100 MG tablet take 1 tablet by mouth once daily  30 tablet  5  . Multiple Vitamins-Minerals (HAIR/SKIN/NAILS) TABS Take by mouth 2 (two) times daily.       . Nebivolol HCl 20 MG TABS Take 0.5 tablets by mouth daily.      . potassium bicarbonate (K-EFFERVESCENT) 25 MEQ disintegrating tablet Take 25 mEq by mouth. 1-2 times daily      . Pyridoxine HCl (B-6) 100 MG TABS Take 2-3 tablets by mouth daily.       . valACYclovir (VALTREX) 1000 MG tablet Take 500 mg by mouth 2 (two) times daily. 1/2 tablet two times a day      . [DISCONTINUED] fexofenadine (ALLEGRA) 180 MG tablet Take 1 tablet (180 mg total) by mouth daily.  90 tablet  2  . [DISCONTINUED] K-EFFERVESCENT 25 MEQ disintegrating tablet DISSOLVE & TAKE ONE TABLET BY MOUTH TWICE DAILY  60 tablet  5  . [DISCONTINUED] azithromycin (ZITHROMAX) 250 MG tablet TAKE 2 TABLETS BY MOUTH TODAY, THEN TAKE 1 TABLET DAILY FOR 4 DAYS  6 tablet  0  . [DISCONTINUED] ciclopirox (PENLAC) 8 % solution Apply topically at bedtime. Apply over nail and surrounding skin. Apply daily over previous coat. After seven (7) days, may remove with alcohol and continue cycle.  6.6 mL  0  . [DISCONTINUED] fluconazole (DIFLUCAN) 150 MG tablet Take 1 tablet (150 mg total) by mouth once. prn  1 tablet  2  . [DISCONTINUED] meclizine (ANTIVERT) 12.5 MG tablet Take 1 tablet (12.5 mg total) by mouth 3 (three) times daily as needed.  60 tablet  1  . [DISCONTINUED] potassium  bicarbonate (K-VESCENT) 25 MEQ disintegrating tablet        No facility-administered encounter medications on file as of 06/02/2012.

## 2012-06-03 NOTE — Assessment & Plan Note (Signed)
Pulmonary sarcoidosis with evidence on chest x-ray of progression in both upper lung zones with now parenchymal involvement The patient appears to be relatively asymptomatic despite x-ray changes Plan Obtain CT scan of the chest high resolution without contrast Obtain full set of pulmonary function studies No change in medications at this time

## 2012-06-03 NOTE — Progress Notes (Signed)
Quick Note:  Notify the patient her xray shows mild progression in sarcoidosis. She will need a CT Chest in addition to the pulmonary function studies ______

## 2012-06-04 NOTE — Progress Notes (Signed)
Quick Note:  Called # listed as pt's home # - went directly to VM. [email or web address] Called # listed at pt's cell # - line busy x 4 ______

## 2012-06-05 ENCOUNTER — Telehealth: Payer: Self-pay | Admitting: Critical Care Medicine

## 2012-06-05 ENCOUNTER — Ambulatory Visit (INDEPENDENT_AMBULATORY_CARE_PROVIDER_SITE_OTHER)
Admission: RE | Admit: 2012-06-05 | Discharge: 2012-06-05 | Disposition: A | Discharge status: Home / Self Care | Payer: 59 | Source: Ambulatory Visit | Attending: Critical Care Medicine | Admitting: Critical Care Medicine

## 2012-06-05 DIAGNOSIS — D869 Sarcoidosis, unspecified: Secondary | ICD-10-CM

## 2012-06-05 MED ORDER — PREDNISONE 10 MG PO TABS: ORAL_TABLET | ORAL | Status: DC

## 2012-06-05 NOTE — Telephone Encounter (Signed)
Pt aware of CT Chest results which are worse Pt needs an OV with me after June 11 PFTs I sent prednisone to pharmacy and pt aware to pick up

## 2012-06-10 ENCOUNTER — Other Ambulatory Visit: Payer: Self-pay | Admitting: Internal Medicine

## 2012-06-10 NOTE — Telephone Encounter (Signed)
Called, spoke with pt.   Dr. Delford Field isn't in the office on June 11 or any after this date during the same week in the [place] office (pt prefers [place] office). We have scheduled pt to see PW on Monday, June 16 at 1:30 pm in [place]. Appt reminder card mailed to pt's verified address per pt's request. She verbalized understanding of appt date and time, is ok with this appt, and will call back if anything is needed prior to this.

## 2012-06-24 ENCOUNTER — Ambulatory Visit (INDEPENDENT_AMBULATORY_CARE_PROVIDER_SITE_OTHER): Payer: 59 | Admitting: Critical Care Medicine

## 2012-06-24 DIAGNOSIS — D869 Sarcoidosis, unspecified: Secondary | ICD-10-CM

## 2012-06-24 DIAGNOSIS — J45909 Unspecified asthma, uncomplicated: Secondary | ICD-10-CM

## 2012-06-24 LAB — PULMONARY FUNCTION TEST
DL/VA % pred: 120 %
FEF2575-%Change-Post: 11 %
FEF2575-%Pred-Pre: 71 %
FEV1-%Change-Post: 3 %
FEV1-%Pred-Post: 104 %
FEV1-%Pred-Pre: 101 %
FEV1-Pre: 1.93 L
FEV1FVC-%Pred-Pre: 92 %
FEV6-%Pred-Post: 111 %
FEV6FVC-%Pred-Pre: 103 %
FVC-Post: 2.6 L
Post FEV1/FVC ratio: 77 %
RV: 1.2 L
TLC % pred: 84 %

## 2012-06-24 NOTE — Progress Notes (Signed)
PFT done today. 

## 2012-06-29 ENCOUNTER — Ambulatory Visit (INDEPENDENT_AMBULATORY_CARE_PROVIDER_SITE_OTHER): Payer: 59 | Admitting: Critical Care Medicine

## 2012-06-29 ENCOUNTER — Encounter: Payer: Self-pay | Admitting: Critical Care Medicine

## 2012-06-29 VITALS — BP 122/80 | HR 64 | Temp 98.7°F | Ht 60.0 in | Wt 149.0 lb

## 2012-06-29 DIAGNOSIS — D869 Sarcoidosis, unspecified: Secondary | ICD-10-CM

## 2012-06-29 MED ORDER — MOMETASONE FUROATE 220 MCG/INH IN AEPB: 2.0000 | INHALATION_SPRAY | Freq: Every day | RESPIRATORY_TRACT | Status: DC

## 2012-06-29 NOTE — Progress Notes (Signed)
C Subjective:    Patient ID: Erasmo Score, female    DOB: 1960/11/25, 52 y.o.   MRN: [id number]  HPI  52 y.o.  AAF  female with a history of sarcoidosis Stage II, and allergic rhinitis   06/02/2012 Pt is at baseline.  No real cough.  Pt denies any significant sore throat, nasal congestion or excess secretions, fever, chills, sweats, unintended weight loss, pleurtic or exertional chest pain, orthopnea PND, or leg swelling Pt denies any increase in rescue therapy over baseline, denies waking up needing it or having any early am or nocturnal exacerbations of coughing/wheezing/or dyspnea. Pt also denies any obvious fluctuation in symptoms with  weather or environmental change or other alleviating or aggravating factors  06/29/2012 Pt had [place] CXR and PFTs done today.   Pred already Rx and finished.  Doing well now.  PFTs are normal  Review of Systems  Constitutional:   No  weight loss, night sweats,  Fevers, chills, fatigue, or  lassitude.  HEENT:   No headaches,  Difficulty swallowing,  Tooth/dental problems, or  Sore throat,                No sneezing, itching, ear ache, no nasal congestion, post nasal drip,   CV:  No chest pain,  Orthopnea, PND, swelling in lower extremities, anasarca, dizziness, palpitations, syncope.   GI  No heartburn, indigestion, abdominal pain, nausea, vomiting, diarrhea, change in bowel habits, loss of appetite, bloody stools.   Resp:  ,  No coughing up of blood.     No chest wall deformity  Skin: no rash or lesions.  GU: no dysuria, change in color of urine, no urgency or frequency.  No flank pain, no hematuria   MS:  No joint pain or swelling.  No decreased range of motion.  No back pain.  Psych:  No change in mood or affect. No depression or anxiety.  No memory loss.     Objective:   Physical Exam BP 122/80  Pulse 64  Temp(Src) 98.7 F (37.1 C) (Oral)  Ht 5' (1.524 m)  Wt 149 lb (67.586 kg)  BMI 29.1 kg/m2  SpO2 98%  GEN: A/Ox3; pleasant  , NAD, well nourished   HEENT:  [place]/AT,  EACs-clear, TMs-wnl, NOSE-mod turbinate edema,  THROAT-clear, no lesions, no postnasal drip or exudate noted.   NECK:  Supple w/ fair ROM; no JVD; normal carotid impulses w/o bruits; no thyromegaly or nodules palpated; no lymphadenopathy.  RESP  Coarse BS , no wheezes/ rales/ or rhonchi.no accessory muscle use, no dullness to percussion  CARD:  RRR, no m/r/g  , no peripheral edema, pulses intact, no cyanosis or clubbing.  GI:   Soft & nt; nml bowel sounds; no organomegaly or masses detected.  Musco: Warm bil, no deformities or joint swelling noted.   Neuro: alert, no focal deficits noted.    Skin: Warm, no lesions or rashes  No results found.        Assessment & Plan:   SARCOIDOSIS, PULMONARY Pulmonary sarcoidosis with evidence of progression in both upper lung zones status post improvement following pulse prednisone Plan To begin topical steroids inhaled   Asmanex two puff daily No other medication changes Return 2 months     Updated Medication List Outpatient Encounter Prescriptions as of 06/29/2012  Medication Sig Dispense Refill  . acyclovir (ZOVIRAX) 5 % cream Apply topically as needed.        [name] amLODipine (NORVASC) 5 MG tablet TAKE 1/2 TO 1  TABLET BY MOUTH ONCE DAILY  30 tablet  5  . Calcium-Magnesium-Vitamin D (SUPER CAL-MAG-D) 500-250-125 MG-MG-UNIT TABS Take by mouth. 1-2 daily      . Dorzolamide HCl-Timolol Mal (COSOPT OP) Apply to eye. 1 drop two times a day in left eye       . fexofenadine (ALLEGRA) 180 MG tablet Take 45 mg by mouth daily as needed.       . fluticasone (FLONASE) 50 MCG/ACT nasal spray Place 2 sprays into the nose daily.      . Hypertonic Nasal Wash (NASAFLO NETI POT NASAL WASH NA) by Nasal route.        [name] losartan (COZAAR) 100 MG tablet take 1 tablet by mouth once daily  30 tablet  5  . Multiple Vitamins-Minerals (HAIR/SKIN/NAILS) TABS Take by mouth 2 (two) times daily.       . Nebivolol HCl 20 MG TABS  Take 0.5 tablets by mouth daily.      . potassium bicarbonate (K-EFFERVESCENT) 25 MEQ disintegrating tablet Take 25 mEq by mouth. 1-2 times daily      . Pyridoxine HCl (B-6) 100 MG TABS Take 2-3 tablets by mouth daily.       . valACYclovir (VALTREX) 1000 MG tablet Take 500 mg by mouth 2 (two) times daily. 1/2 tablet two times a day      . mometasone (ASMANEX 120 METERED DOSES) 220 MCG/INH inhaler Inhale 2 puffs into the lungs daily.  1 Inhaler  12  . [DISCONTINUED] predniSONE (DELTASONE) 10 MG tablet Take 4 for three days 3 for three days 2 for three days then one daily and stay  40 tablet  6   No facility-administered encounter medications on file as of 06/29/2012.

## 2012-06-29 NOTE — Patient Instructions (Addendum)
Start Asmanex two puff daily No other medication changes Return 2 months

## 2012-06-30 NOTE — Assessment & Plan Note (Signed)
Pulmonary sarcoidosis with evidence of progression in both upper lung zones status post improvement following pulse prednisone Plan To begin topical steroids inhaled   Asmanex two puff daily No other medication changes Return 2 months

## 2012-07-06 ENCOUNTER — Telehealth: Payer: Self-pay | Admitting: Gastroenterology

## 2012-07-06 ENCOUNTER — Other Ambulatory Visit: Payer: Self-pay | Admitting: Internal Medicine

## 2012-07-06 NOTE — Telephone Encounter (Signed)
Pt states she has been having abdominal pain and is requesting to be seen, pt actually wants an EGD done. Pt scheduled to see Doug Sou PA tomorrow at 10am. Pt aware of appt date and time.

## 2012-07-07 ENCOUNTER — Encounter: Payer: Self-pay | Admitting: Gastroenterology

## 2012-07-07 ENCOUNTER — Ambulatory Visit (INDEPENDENT_AMBULATORY_CARE_PROVIDER_SITE_OTHER): Payer: 59 | Admitting: Gastroenterology

## 2012-07-07 VITALS — BP 120/82 | HR 66 | Ht 60.25 in | Wt 149.5 lb

## 2012-07-07 DIAGNOSIS — R1032 Left lower quadrant pain: Secondary | ICD-10-CM

## 2012-07-07 NOTE — Progress Notes (Signed)
07/07/2012 [name] [id number] 11/19/1960   History of Present Illness:  Ms. [name] is a 52 year old female who underwent colonoscopy by Dr. Arlyce Dice in December 2007 for evaluation of bowel changes. The examination was completely normal.  She comes in today with complaints of LLQ abdominal pain.  She was seen for this same pain almost two years ago at which time she had some labs drawn and a urinalysis ordered; they were unremarkable.  She was given levsin to try as well, but the plan was to consider CT scan if pain continued or worsened.  Today she says that the same discomfort is still present as it was almost two years ago.  There constantly feels like there is something in her LLQ, but the "pain" comes and goes.  She had an episode of pain yesterday that reached a 7/10 in intensity.  She says that it was the worst episode that she has experienced.  Pain does not seem to be triggered by food; pain began yesterday morning before she ate anything, but then was relieved by having a BM.  Says that stool was soft yesterday, but has regular, normal BM's for the most part.  No blood in stool.  No nausea or vomiting, fevers or chills.   Current Medications, Allergies, Past Medical History, Past Surgical History, Family History and Social History were reviewed in [employer] record.   Physical Exam: BP 120/82  Pulse 66  Ht 5' 0.25" (1.53 m)  Wt 149 lb 8 oz (67.813 kg)  BMI 28.97 kg/m2 General: Well developed, black female in no acute distress Head: Normocephalic and atraumatic Eyes:  sclerae anicteric, conjunctiva pink  Ears: Normal auditory acuity Lungs: Clear throughout to auscultation Heart: Regular rate and rhythm Abdomen: Soft, non-distended. No masses, no hepatomegaly. Normal bowel sounds.  LLQ TTP without R/R/G. Musculoskeletal: Symmetrical with no gross deformities  Extremities: No edema  Neurological: Alert oriented x 4, grossly nonfocal Psychological:  Alert  and cooperative. Normal mood and affect  Assessment and Recommendations: -LLQ abdominal pain that has been present/intermittent for almost two years.  Constant discomfort but has episodic pain that reached a 7/10 on the pain scale yesterday.  Will check CT scan of the abdomen pelvis since the pain has been ongoing and worsening in intensity at times.

## 2012-07-07 NOTE — Patient Instructions (Addendum)
You have been scheduled for a CT scan of the abdomen and pelvis at [facility] ([address]---this is in the same building as Architectural technologist).   You are scheduled on 07/08/12 at 2 pm . You should arrive 15 minutes prior to your appointment time for registration. Please follow the written instructions below on the day of your exam:  WARNING: IF YOU ARE ALLERGIC TO IODINE/X-RAY DYE, PLEASE NOTIFY RADIOLOGY IMMEDIATELY AT [phone]! YOU WILL BE GIVEN A 13 HOUR PREMEDICATION PREP.  1) Do not eat or drink anything after 10 am (4 hours prior to your test) 2) You have been given 2 bottles of oral contrast to drink. The solution may taste better if refrigerated, but do NOT add ice or any other liquid to this solution. Shake well before drinking.    Drink 1 bottle of contrast @  12 noon (2 hours prior to your exam)  Drink 1 bottle of contrast @ 1 pm  (1 hour prior to your exam)  You may take any medications as prescribed with a small amount of water except for the following: Metformin, Glucophage, Glucovance, Avandamet, Riomet, Fortamet, Actoplus Met, Janumet, Glumetza or Metaglip. The above medications must be held the day of the exam AND 48 hours after the exam.  The purpose of you drinking the oral contrast is to aid in the visualization of your intestinal tract. The contrast solution may cause some diarrhea. Before your exam is started, you will be given a small amount of fluid to drink. Depending on your individual set of symptoms, you may also receive an intravenous injection of x-ray contrast/dye. Plan on being at [facility] for 30 minutes or long, depending on the type of exam you are having performed.  This test typically takes 30-45 minutes to complete.  If you have any questions regarding your exam or if you need to reschedule, you may call the CT department at [phone] between the hours of 8:00 am and 5:00 pm,  Monday-Friday.  ________________________________________________________________________ CC:  Jacinta Shoe MD

## 2012-07-08 ENCOUNTER — Ambulatory Visit (INDEPENDENT_AMBULATORY_CARE_PROVIDER_SITE_OTHER)
Admission: RE | Admit: 2012-07-08 | Discharge: 2012-07-08 | Disposition: A | Discharge status: Home / Self Care | Payer: 59 | Source: Ambulatory Visit | Attending: Gastroenterology | Admitting: Gastroenterology

## 2012-07-08 DIAGNOSIS — R1032 Left lower quadrant pain: Secondary | ICD-10-CM

## 2012-07-08 MED ORDER — IOHEXOL 300 MG/ML  SOLN
100.0000 mL | Freq: Once | INTRAMUSCULAR | Status: AC | PRN
  Administered 2012-07-08: 100 mL via INTRAVENOUS

## 2012-07-08 NOTE — Progress Notes (Signed)
What is your assessment of the etiology for the pain?  Could she have acute diverticulitis? Ischemic colitis?  I think a differential diagnosis is important.

## 2012-07-09 ENCOUNTER — Encounter: Payer: Self-pay | Admitting: Critical Care Medicine

## 2012-07-09 ENCOUNTER — Telehealth: Payer: Self-pay | Admitting: Gastroenterology

## 2012-07-09 MED ORDER — DICYCLOMINE HCL 10 MG PO CAPS: ORAL_CAPSULE | ORAL | Status: DC

## 2012-07-09 NOTE — Telephone Encounter (Signed)
Patient given results and rx sent to pharmacy.

## 2012-07-09 NOTE — Telephone Encounter (Signed)
Patient asking for CT results. Please, advise. 

## 2012-07-09 NOTE — Telephone Encounter (Signed)
Patient's CT scan is normal, showing no cause for pain.  Try anti-spasmodic such as bentyl 10 mg BID.

## 2012-07-15 ENCOUNTER — Encounter: Payer: Self-pay | Admitting: Critical Care Medicine

## 2012-07-16 ENCOUNTER — Ambulatory Visit: Payer: 59 | Admitting: Internal Medicine

## 2012-07-24 ENCOUNTER — Encounter: Payer: Self-pay | Admitting: Internal Medicine

## 2012-07-24 ENCOUNTER — Ambulatory Visit (INDEPENDENT_AMBULATORY_CARE_PROVIDER_SITE_OTHER): Payer: 59 | Admitting: Internal Medicine

## 2012-07-24 ENCOUNTER — Other Ambulatory Visit (INDEPENDENT_AMBULATORY_CARE_PROVIDER_SITE_OTHER): Payer: 59

## 2012-07-24 VITALS — BP 140/92 | HR 72 | Temp 98.2°F | Resp 16 | Wt 149.0 lb

## 2012-07-24 DIAGNOSIS — F411 Generalized anxiety disorder: Secondary | ICD-10-CM

## 2012-07-24 DIAGNOSIS — W19XXXS Unspecified fall, sequela: Secondary | ICD-10-CM

## 2012-07-24 DIAGNOSIS — J45909 Unspecified asthma, uncomplicated: Secondary | ICD-10-CM

## 2012-07-24 DIAGNOSIS — W010XXS Fall on same level from slipping, tripping and stumbling without subsequent striking against object, sequela: Secondary | ICD-10-CM

## 2012-07-24 DIAGNOSIS — R739 Hyperglycemia, unspecified: Secondary | ICD-10-CM

## 2012-07-24 DIAGNOSIS — R7309 Other abnormal glucose: Secondary | ICD-10-CM

## 2012-07-24 DIAGNOSIS — I10 Essential (primary) hypertension: Secondary | ICD-10-CM

## 2012-07-24 DIAGNOSIS — W010XXA Fall on same level from slipping, tripping and stumbling without subsequent striking against object, initial encounter: Secondary | ICD-10-CM | POA: Insufficient documentation

## 2012-07-24 LAB — HEMOGLOBIN A1C: Hgb A1c MFr Bld: 6.2 % (ref 4.6–6.5)

## 2012-07-24 LAB — URINALYSIS
Specific Gravity, Urine: 1.015 (ref 1.000–1.030)
Total Protein, Urine: NEGATIVE
Urine Glucose: NEGATIVE
pH: 7.5 (ref 5.0–8.0)

## 2012-07-24 LAB — BASIC METABOLIC PANEL
CO2: 31 mEq/L (ref 19–32)
Chloride: 104 mEq/L (ref 96–112)
Creatinine, Ser: 1 mg/dL (ref 0.4–1.2)
Potassium: 3.5 mEq/L (ref 3.5–5.1)
Sodium: 141 mEq/L (ref 135–145)

## 2012-07-24 NOTE — Assessment & Plan Note (Signed)
Continue with current prescription therapy as reflected on the Med list.  

## 2012-07-24 NOTE — Progress Notes (Signed)
   Subjective:   Hypertension Pertinent negatives include no headaches.    C/o a fall on Mon at work - seeing a Radiographer, therapeutic doctor  The patient presents for a follow-up of  chronic hypertension, chronic dyslipidemia, sarcoid, allergies  controlled with medicines      Review of Systems  Constitutional: Negative for chills, activity change, appetite change, fatigue and unexpected weight change.  HENT: Negative for congestion, mouth sores and sinus pressure.   Eyes: Negative for visual disturbance.  Respiratory: Negative for cough and chest tightness.   Gastrointestinal: Negative for nausea and abdominal pain.  Genitourinary: Negative for frequency, difficulty urinating and vaginal pain.  Musculoskeletal: Negative for back pain and gait problem.  Skin: Negative for pallor and rash.  Neurological: Negative for dizziness, tremors, weakness, numbness and headaches.  Psychiatric/Behavioral: Negative for confusion and sleep disturbance.   Wt Readings from Last 3 Encounters:  07/24/12 149 lb (67.586 kg)  07/07/12 149 lb 8 oz (67.813 kg)  06/29/12 149 lb (67.586 kg)    BP Readings from Last 3 Encounters:  07/24/12 140/92  07/07/12 120/82  06/29/12 122/80        Objective:   Physical Exam  Constitutional: She appears well-developed and well-nourished. No distress.  HENT:  Head: Normocephalic.  Right Ear: External ear normal.  Left Ear: External ear normal.  Nose: Nose normal.  Mouth/Throat: Oropharynx is clear and moist.  Eyes: Conjunctivae are normal. Pupils are equal, round, and reactive to light. Right eye exhibits no discharge. Left eye exhibits no discharge.  Neck: Normal range of motion. Neck supple. No JVD present. No tracheal deviation present. No thyromegaly present.  Cardiovascular: Normal rate, regular rhythm and normal heart sounds.   Pulmonary/Chest: No stridor. No respiratory distress. She has no wheezes.  Abdominal: Soft. Bowel sounds are normal. She  exhibits no distension and no mass. There is no tenderness. There is no rebound and no guarding.  Musculoskeletal: She exhibits no edema and no tenderness.  Lymphadenopathy:    She has no cervical adenopathy.  Neurological: She displays normal reflexes. No cranial nerve deficit. She exhibits normal muscle tone. Coordination normal.  Skin: No rash noted. No erythema.  Psychiatric: She has a normal mood and affect. Her behavior is normal. Judgment and thought content normal.  sore in the shoulders area Not ataxic Lab Results  Component Value Date   WBC 5.8 07/08/2011   HGB 13.3 07/08/2011   HCT 39.9 07/08/2011   PLT 313.0 07/08/2011   GLUCOSE 91 04/27/2012   CHOL 149 09/02/2011   TRIG 145.0 09/02/2011   HDL 44.70 09/02/2011   LDLCALC 75 09/02/2011   ALT 32 09/02/2011   AST 35 09/02/2011   NA 141 04/27/2012   K 4.0 04/27/2012   CL 102 04/27/2012   CREATININE 1.0 04/27/2012   BUN 19 04/27/2012   CO2 30 04/27/2012   TSH 1.46 09/02/2011   HGBA1C 6.2 01/14/2012          Assessment & Plan:

## 2012-07-24 NOTE — Assessment & Plan Note (Signed)
Contusions - per workman's comp

## 2012-07-24 NOTE — Assessment & Plan Note (Signed)
Watching labs 

## 2012-09-08 ENCOUNTER — Ambulatory Visit: Payer: 59 | Admitting: Critical Care Medicine

## 2012-09-09 ENCOUNTER — Ambulatory Visit: Payer: 59 | Admitting: Critical Care Medicine

## 2012-09-11 ENCOUNTER — Encounter: Payer: Self-pay | Admitting: Critical Care Medicine

## 2012-09-11 ENCOUNTER — Ambulatory Visit (INDEPENDENT_AMBULATORY_CARE_PROVIDER_SITE_OTHER): Payer: 59 | Admitting: Critical Care Medicine

## 2012-09-11 VITALS — BP 126/86 | HR 68 | Temp 98.1°F | Ht 60.25 in | Wt 148.2 lb

## 2012-09-11 DIAGNOSIS — J45909 Unspecified asthma, uncomplicated: Secondary | ICD-10-CM

## 2012-09-11 MED ORDER — MOMETASONE FUROATE 220 MCG/INH IN AEPB: 2.0000 | INHALATION_SPRAY | Freq: Every day | RESPIRATORY_TRACT | Status: DC

## 2012-09-11 NOTE — Progress Notes (Signed)
  Subjective:    Patient ID: [name], female    DOB: [birth year], 52 y.o.   MRN: [id number]  HPI 52 y.o.   AAF  female with a history of sarcoidosis Stage II, and allergic rhinitis   09/11/2012 Chief Complaint  Patient presents with  . 2 month follow up    Breathing doing well overall.  Does have DOE, sneezing, and PND.  No wheezing, chest tightness, or cough at this time.   Not using asmanex correctly. Only on one puff every few days Now off pred.  No real cough.  Notes pndrip. Sneezing. Notes some dyspnea on exertion   Review of Systems Constitutional:   No  weight loss, night sweats,  Fevers, chills, fatigue, lassitude. HEENT:   No headaches,  Difficulty swallowing,  Tooth/dental problems,  Sore throat,                No sneezing, itching, ear ache, nasal congestion, post nasal drip,   CV:  No chest pain,  Orthopnea, PND, swelling in lower extremities, anasarca, dizziness, palpitations  GI  No heartburn, indigestion, abdominal pain, nausea, vomiting, diarrhea, change in bowel habits, loss of appetite  Resp: No shortness of breath with exertion or at rest.  No excess mucus, no productive cough,  No non-productive cough,  No coughing up of blood.  No change in color of mucus.  No wheezing.  No chest wall deformity  Skin: no rash or lesions.  GU: no dysuria, change in color of urine, no urgency or frequency.  No flank pain.  MS:  No joint pain or swelling.  No decreased range of motion.  No back pain.  Psych:  No change in mood or affect. No depression or anxiety.  No memory loss.     Objective:   Physical Exam  Filed Vitals:   09/11/12 1128  BP: 126/86  Pulse: 68  Temp: 98.1 F (36.7 C)  TempSrc: Oral  Height: 5' 0.25" (1.53 m)  Weight: 148 lb 3.2 oz (67.223 kg)  SpO2: 96%    Gen: Pleasant, well-nourished, in no distress,  normal affect  ENT: No lesions,  mouth clear,  oropharynx clear, no postnasal drip  Neck: No JVD, no TMG, no carotid bruits  Lungs:  No use of accessory muscles, no dullness to percussion, clear without rales or rhonchi  Cardiovascular: RRR, heart sounds normal, no murmur or gallops, no peripheral edema  Abdomen: soft and NT, no HSM,  BS normal  Musculoskeletal: No deformities, no cyanosis or clubbing  Neuro: alert, non focal  Skin: Warm, no lesions or rashes  No results found.       Assessment & Plan:

## 2012-09-11 NOTE — Patient Instructions (Addendum)
Use asmanex two puff daily Return 4 months

## 2012-09-12 NOTE — Assessment & Plan Note (Signed)
Chronic persis asthma  Plan Cont asmanex

## 2012-09-28 ENCOUNTER — Other Ambulatory Visit: Payer: Self-pay | Admitting: Internal Medicine

## 2012-10-23 ENCOUNTER — Encounter: Payer: Self-pay | Admitting: Internal Medicine

## 2012-10-23 ENCOUNTER — Other Ambulatory Visit (INDEPENDENT_AMBULATORY_CARE_PROVIDER_SITE_OTHER): Payer: 59

## 2012-10-23 ENCOUNTER — Ambulatory Visit (INDEPENDENT_AMBULATORY_CARE_PROVIDER_SITE_OTHER): Payer: 59 | Admitting: Internal Medicine

## 2012-10-23 VITALS — BP 125/85 | HR 88 | Temp 98.5°F | Resp 16 | Wt 147.0 lb

## 2012-10-23 DIAGNOSIS — J45909 Unspecified asthma, uncomplicated: Secondary | ICD-10-CM

## 2012-10-23 DIAGNOSIS — R739 Hyperglycemia, unspecified: Secondary | ICD-10-CM

## 2012-10-23 DIAGNOSIS — R7309 Other abnormal glucose: Secondary | ICD-10-CM

## 2012-10-23 DIAGNOSIS — I10 Essential (primary) hypertension: Secondary | ICD-10-CM

## 2012-10-23 DIAGNOSIS — F411 Generalized anxiety disorder: Secondary | ICD-10-CM

## 2012-10-23 LAB — CBC WITH DIFFERENTIAL/PLATELET
Basophils Relative: 0.9 % (ref 0.0–3.0)
Eosinophils Relative: 2.9 % (ref 0.0–5.0)
HCT: 40.5 % (ref 36.0–46.0)
Lymphs Abs: 1.5 10*3/uL (ref 0.7–4.0)
MCV: 90.5 fl (ref 78.0–100.0)
Monocytes Absolute: 0.8 10*3/uL (ref 0.1–1.0)
Monocytes Relative: 12.5 % — ABNORMAL HIGH (ref 3.0–12.0)
Neutrophils Relative %: 60.6 % (ref 43.0–77.0)
RBC: 4.48 Mil/uL (ref 3.87–5.11)
WBC: 6.6 10*3/uL (ref 4.5–10.5)

## 2012-10-23 LAB — BASIC METABOLIC PANEL
Chloride: 104 mEq/L (ref 96–112)
GFR: 69.67 mL/min (ref >60.00)
Potassium: 4.1 mEq/L (ref 3.5–5.1)

## 2012-10-23 LAB — VITAMIN B12: Vitamin B-12: >1500 pg/mL — ABNORMAL HIGH (ref 211–911)

## 2012-10-23 MED ORDER — MOMETASONE FUROATE 220 MCG/INH IN AEPB: 2.0000 | INHALATION_SPRAY | Freq: Every day | RESPIRATORY_TRACT | Status: DC

## 2012-10-23 NOTE — Patient Instructions (Signed)
Gluten free trial (no wheat products) for 4-6 weeks. OK to use gluten-free bread and gluten-free pasta.  Milk free trial (no milk, ice cream, cheese and yogurt) for 4-6 weeks. OK to use almond, coconut, rice or soy milk. "Almond breeze" brand tastes good.  

## 2012-10-23 NOTE — Progress Notes (Signed)
   Subjective:   Hypertension This is a chronic problem. Pertinent negatives include no headaches.    F/u a fall on Mon at work - seeing a Arts administrator. Better  The patient presents for a follow-up of  chronic hypertension, chronic dyslipidemia, sarcoid, allergies  controlled with medicines      Review of Systems  Constitutional: Negative for chills, activity change, appetite change, fatigue and unexpected weight change.  HENT: Negative for congestion, mouth sores and sinus pressure.   Eyes: Negative for visual disturbance.  Respiratory: Negative for cough and chest tightness.   Gastrointestinal: Negative for nausea and abdominal pain.  Genitourinary: Negative for frequency, difficulty urinating and vaginal pain.  Musculoskeletal: Negative for back pain and gait problem.  Skin: Negative for pallor and rash.  Neurological: Negative for dizziness, tremors, weakness, numbness and headaches.  Psychiatric/Behavioral: Negative for confusion and sleep disturbance.   Wt Readings from Last 3 Encounters:  10/23/12 147 lb (66.679 kg)  09/11/12 148 lb 3.2 oz (67.223 kg)  07/24/12 149 lb (67.586 kg)    BP Readings from Last 3 Encounters:  10/23/12 146/90  09/11/12 126/86  07/24/12 140/92        Objective:   Physical Exam  Constitutional: She appears well-developed and well-nourished. No distress.  HENT:  Head: Normocephalic.  Right Ear: External ear normal.  Left Ear: External ear normal.  Nose: Nose normal.  Mouth/Throat: Oropharynx is clear and moist.  Eyes: Conjunctivae are normal. Pupils are equal, round, and reactive to light. Right eye exhibits no discharge. Left eye exhibits no discharge.  Neck: Normal range of motion. Neck supple. No JVD present. No tracheal deviation present. No thyromegaly present.  Cardiovascular: Normal rate, regular rhythm and normal heart sounds.   Pulmonary/Chest: No stridor. No respiratory distress. She has no wheezes.  Abdominal:  Soft. Bowel sounds are normal. She exhibits no distension and no mass. There is no tenderness. There is no rebound and no guarding.  Musculoskeletal: She exhibits no edema and no tenderness.  Lymphadenopathy:    She has no cervical adenopathy.  Neurological: She displays normal reflexes. No cranial nerve deficit. She exhibits normal muscle tone. Coordination normal.  Skin: No rash noted. No erythema.  Psychiatric: She has a normal mood and affect. Her behavior is normal. Judgment and thought content normal.  sore in the shoulders area Not ataxic Lab Results  Component Value Date   WBC 5.8 07/08/2011   HGB 13.3 07/08/2011   HCT 39.9 07/08/2011   PLT 313.0 07/08/2011   GLUCOSE 91 07/24/2012   CHOL 149 09/02/2011   TRIG 145.0 09/02/2011   HDL 44.70 09/02/2011   LDLCALC 75 09/02/2011   ALT 32 09/02/2011   AST 35 09/02/2011   NA 141 07/24/2012   K 3.5 07/24/2012   CL 104 07/24/2012   CREATININE 1.0 07/24/2012   BUN 17 07/24/2012   CO2 31 07/24/2012   TSH 1.46 09/02/2011   HGBA1C 6.2 07/24/2012          Assessment & Plan:

## 2012-10-23 NOTE — Assessment & Plan Note (Signed)
Continue with current prescription therapy as reflected on the Med list.  

## 2012-10-23 NOTE — Assessment & Plan Note (Signed)
Asmanex bid

## 2012-10-23 NOTE — Assessment & Plan Note (Signed)
Labs

## 2013-01-25 ENCOUNTER — Encounter: Payer: Self-pay | Admitting: Internal Medicine

## 2013-01-25 ENCOUNTER — Other Ambulatory Visit (INDEPENDENT_AMBULATORY_CARE_PROVIDER_SITE_OTHER): Payer: 59

## 2013-01-25 ENCOUNTER — Ambulatory Visit (INDEPENDENT_AMBULATORY_CARE_PROVIDER_SITE_OTHER): Payer: 59 | Admitting: Internal Medicine

## 2013-01-25 VITALS — BP 130/80 | HR 72 | Temp 98.2°F | Resp 16 | Wt 147.0 lb

## 2013-01-25 DIAGNOSIS — I10 Essential (primary) hypertension: Secondary | ICD-10-CM

## 2013-01-25 DIAGNOSIS — Z20828 Contact with and (suspected) exposure to other viral communicable diseases: Secondary | ICD-10-CM

## 2013-01-25 DIAGNOSIS — J45909 Unspecified asthma, uncomplicated: Secondary | ICD-10-CM

## 2013-01-25 DIAGNOSIS — F411 Generalized anxiety disorder: Secondary | ICD-10-CM

## 2013-01-25 DIAGNOSIS — J309 Allergic rhinitis, unspecified: Secondary | ICD-10-CM

## 2013-01-25 DIAGNOSIS — R739 Hyperglycemia, unspecified: Secondary | ICD-10-CM

## 2013-01-25 DIAGNOSIS — R7309 Other abnormal glucose: Secondary | ICD-10-CM

## 2013-01-25 DIAGNOSIS — M1990 Unspecified osteoarthritis, unspecified site: Secondary | ICD-10-CM

## 2013-01-25 LAB — BASIC METABOLIC PANEL
BUN: 18 mg/dL (ref 6–23)
CALCIUM: 10.5 mg/dL (ref 8.4–10.5)
CO2: 33 meq/L — AB (ref 19–32)
CREATININE: 1 mg/dL (ref 0.4–1.2)
Chloride: 105 mEq/L (ref 96–112)
GFR: 72.76 mL/min (ref >60.00)
GLUCOSE: 100 mg/dL — AB (ref 70–99)
Potassium: 4.1 mEq/L (ref 3.5–5.1)
Sodium: 145 mEq/L (ref 135–145)

## 2013-01-25 LAB — HEPATIC FUNCTION PANEL
ALK PHOS: 73 U/L (ref 39–117)
ALT: 31 U/L (ref 0–35)
AST: 29 U/L (ref 0–37)
Albumin: 4.1 g/dL (ref 3.5–5.2)
BILIRUBIN TOTAL: 0.7 mg/dL (ref 0.3–1.2)
Bilirubin, Direct: 0.1 mg/dL (ref 0.0–0.3)
Total Protein: 7.6 g/dL (ref 6.0–8.3)

## 2013-01-25 LAB — LIPID PANEL
CHOL/HDL RATIO: 4
Cholesterol: 150 mg/dL (ref 0–200)
HDL: 38.9 mg/dL — ABNORMAL LOW (ref >39.00)
LDL Cholesterol: 100 mg/dL — ABNORMAL HIGH (ref 0–99)
TRIGLYCERIDES: 55 mg/dL (ref 0.0–149.0)
VLDL: 11 mg/dL (ref 0.0–40.0)

## 2013-01-25 LAB — IBC PANEL
IRON: 59 ug/dL (ref 42–145)
SATURATION RATIOS: 18.1 % — AB (ref 20.0–50.0)
Transferrin: 232.6 mg/dL (ref 212.0–360.0)

## 2013-01-25 NOTE — Assessment & Plan Note (Signed)
Continue with current prescription therapy as reflected on the Med list.  

## 2013-01-25 NOTE — Progress Notes (Signed)
Pre visit review using our clinic review tool, if applicable. No additional management support is needed unless otherwise documented below in the visit note. 

## 2013-01-25 NOTE — Progress Notes (Signed)
   Subjective:   Hypertension This is a chronic problem. Pertinent negatives include no headaches.   C/o exposure to flu yesterday  F/u a fall on Mon at work - seeing a Copy. Better  The patient presents for a follow-up of  chronic hypertension, chronic dyslipidemia, sarcoid, allergies  controlled with medicines      Review of Systems  Constitutional: Negative for chills, activity change, appetite change, fatigue and unexpected weight change.  HENT: Negative for congestion, mouth sores and sinus pressure.   Eyes: Negative for visual disturbance.  Respiratory: Negative for cough and chest tightness.   Gastrointestinal: Negative for nausea and abdominal pain.  Genitourinary: Negative for frequency, difficulty urinating and vaginal pain.  Musculoskeletal: Negative for back pain and gait problem.  Skin: Negative for pallor and rash.  Neurological: Negative for dizziness, tremors, weakness, numbness and headaches.  Psychiatric/Behavioral: Negative for confusion and sleep disturbance.   Wt Readings from Last 3 Encounters:  01/25/13 147 lb (66.679 kg)  10/23/12 147 lb (66.679 kg)  09/11/12 148 lb 3.2 oz (67.223 kg)    BP Readings from Last 3 Encounters:  01/25/13 160/110  10/23/12 125/85  09/11/12 126/86        Objective:   Physical Exam  Constitutional: She appears well-developed and well-nourished. No distress.  HENT:  Head: Normocephalic.  Right Ear: External ear normal.  Left Ear: External ear normal.  Nose: Nose normal.  Mouth/Throat: Oropharynx is clear and moist.  Eyes: Conjunctivae are normal. Pupils are equal, round, and reactive to light. Right eye exhibits no discharge. Left eye exhibits no discharge.  Neck: Normal range of motion. Neck supple. No JVD present. No tracheal deviation present. No thyromegaly present.  Cardiovascular: Normal rate, regular rhythm and normal heart sounds.   Pulmonary/Chest: No stridor. No respiratory distress. She  has no wheezes.  Abdominal: Soft. Bowel sounds are normal. She exhibits no distension and no mass. There is no tenderness. There is no rebound and no guarding.  Musculoskeletal: She exhibits no edema and no tenderness.  Lymphadenopathy:    She has no cervical adenopathy.  Neurological: She displays normal reflexes. No cranial nerve deficit. She exhibits normal muscle tone. Coordination normal.  Skin: No rash noted. No erythema.  Psychiatric: She has a normal mood and affect. Her behavior is normal. Judgment and thought content normal.  sore in the shoulders area Not ataxic Lab Results  Component Value Date   WBC 6.6 10/23/2012   HGB 13.9 10/23/2012   HCT 40.5 10/23/2012   PLT 334.0 10/23/2012   GLUCOSE 94 10/23/2012   CHOL 149 09/02/2011   TRIG 145.0 09/02/2011   HDL 44.70 09/02/2011   LDLCALC 75 09/02/2011   ALT 32 09/02/2011   AST 35 09/02/2011   NA 145 10/23/2012   K 4.1 10/23/2012   CL 104 10/23/2012   CREATININE 1.1 10/23/2012   BUN 22 10/23/2012   CO2 33* 10/23/2012   TSH 1.46 09/02/2011   HGBA1C 6.2 07/24/2012          Assessment & Plan:

## 2013-01-25 NOTE — Assessment & Plan Note (Signed)
Flu test

## 2013-01-30 ENCOUNTER — Other Ambulatory Visit: Payer: Self-pay | Admitting: Internal Medicine

## 2013-02-08 ENCOUNTER — Encounter: Payer: Self-pay | Admitting: Critical Care Medicine

## 2013-02-08 ENCOUNTER — Ambulatory Visit (INDEPENDENT_AMBULATORY_CARE_PROVIDER_SITE_OTHER): Payer: 59 | Admitting: Critical Care Medicine

## 2013-02-08 VITALS — BP 132/86 | HR 72 | Temp 98.0°F | Ht 60.5 in | Wt 149.8 lb

## 2013-02-08 DIAGNOSIS — J45909 Unspecified asthma, uncomplicated: Secondary | ICD-10-CM

## 2013-02-08 DIAGNOSIS — J4540 Moderate persistent asthma, uncomplicated: Secondary | ICD-10-CM

## 2013-02-08 NOTE — Patient Instructions (Signed)
No change in medications. Return in        6 months        

## 2013-02-08 NOTE — Progress Notes (Signed)
Subjective:    Patient ID: [name], female    DOB: [birth year], 53 y.o.   MRN: [id number]  HPI  53 y.o.   AAF  female with a history of sarcoidosis Stage II, and allergic rhinitis    02/08/2013 Chief Complaint  Patient presents with  . Follow-up    Breathing is unchanged - Does DOE.  No wheezing, chest tightness, or cough at this time.  Breathing remains unchanged. Notes some difficulty if goes up steps and moves fast.  No wheeze or coughing now.  Not using flonase daily.  Pt denies any significant sore throat, nasal congestion or excess secretions, fever, chills, sweats, unintended weight loss, pleurtic or exertional chest pain, orthopnea PND, or leg swelling Pt denies any increase in rescue therapy over baseline, denies waking up needing it or having any early am or nocturnal exacerbations of coughing/wheezing/or dyspnea. Pt also denies any obvious fluctuation in symptoms with  weather or environmental change or other alleviating or aggravating factors Review of Systems  Constitutional:   No  weight loss, night sweats,  Fevers, chills, fatigue, lassitude. HEENT:   No headaches,  Difficulty swallowing,  Tooth/dental problems,  Sore throat,                No sneezing, itching, ear ache, nasal congestion, post nasal drip,   CV:  No chest pain,  Orthopnea, PND, swelling in lower extremities, anasarca, dizziness, palpitations  GI  No heartburn, indigestion, abdominal pain, nausea, vomiting, diarrhea, change in bowel habits, loss of appetite  Resp: +++ shortness of breath with exertion not at rest.  No excess mucus, no productive cough,  No non-productive cough,  No coughing up of blood.  No change in color of mucus.  No wheezing.  No chest wall deformity  Skin: no rash or lesions.  GU: no dysuria, change in color of urine, no urgency or frequency.  No flank pain.  MS:  No joint pain or swelling.  No decreased range of motion.  No back pain.  Psych:  No change in mood or  affect. No depression or anxiety.  No memory loss.     Objective:   Physical Exam   Filed Vitals:   02/08/13 1442  BP: 132/86  Pulse: 72  Temp: 98 F (36.7 C)  TempSrc: Oral  Height: 5' 0.5" (1.537 m)  Weight: 149 lb 12.8 oz (67.949 kg)  SpO2: 97%    Gen: Pleasant, well-nourished, in no distress,  normal affect  ENT: No lesions,  mouth clear,  oropharynx clear, no postnasal drip  Neck: No JVD, no TMG, no carotid bruits  Lungs: No use of accessory muscles, no dullness to percussion, clear without rales or rhonchi  Cardiovascular: RRR, heart sounds normal, no murmur or gallops, no peripheral edema  Abdomen: soft and NT, no HSM,  BS normal  Musculoskeletal: No deformities, no cyanosis or clubbing  Neuro: alert, non focal  Skin: Warm, no lesions or rashes  No results found.       Assessment & Plan:   Asthma, moderate persistent Moderate persistent asthma stable at this time Plan Maintain Asmanex daily  Stable sarcoidosis  Updated Medication List Outpatient Encounter Prescriptions as of 02/08/2013  Medication Sig  . acyclovir (ZOVIRAX) 5 % cream Apply topically as needed.    [name] amLODipine (NORVASC) 5 MG tablet TAKE 1/2 TO 1 TABLET BY MOUTH ONCE DAILY  . Calcium-Magnesium-Vitamin D (SUPER CAL-MAG-D) 500-250-125 MG-MG-UNIT TABS Take by mouth. 1-2 daily  . Cod Liver Oil  OIL Take by mouth. 5 times weekly  . Dorzolamide HCl-Timolol Mal (COSOPT OP) Apply to eye. 1 drop two times a day in left eye   . fluticasone (FLONASE) 50 MCG/ACT nasal spray Place 2 sprays into the nose daily as needed.   . Hypertonic Nasal Wash (NASAFLO NETI POT NASAL WASH NA) Place into the nose as needed.   [name] losartan (COZAAR) 100 MG tablet TAKE 1 TABLET BY MOUTH ONCE A DAY  . mometasone (ASMANEX 60 METERED DOSES) 220 MCG/INH inhaler Inhale 2 puffs into the lungs daily.  . Multiple Vitamins-Minerals (HAIR/SKIN/NAILS) TABS Take by mouth 2 (two) times daily.   . Nebivolol HCl (BYSTOLIC) 20 MG  TABS TAKE 1/2 TABLET BY MOUTH once daily  . potassium bicarbonate (K-EFFERVESCENT) 25 MEQ disintegrating tablet Take 25 mEq by mouth. 1-2 times daily  . Pyridoxine HCl (B-6) 100 MG TABS Take 2-3 tablets by mouth daily.   . traMADol (ULTRAM) 50 MG tablet Take 50 mg by mouth every 8 (eight) hours as needed for pain.  . valACYclovir (VALTREX) 1000 MG tablet Take 500 mg by mouth 2 (two) times daily. 1/2 tablet two times a day  . [DISCONTINUED] BYSTOLIC 20 MG TABS TAKE 1 TABLET BY MOUTH 2 TIMES A DAY

## 2013-02-08 NOTE — Assessment & Plan Note (Signed)
Moderate persistent asthma stable at this time Plan Maintain Asmanex daily 

## 2013-02-26 ENCOUNTER — Encounter: Payer: Self-pay | Admitting: Internal Medicine

## 2013-02-26 ENCOUNTER — Ambulatory Visit (INDEPENDENT_AMBULATORY_CARE_PROVIDER_SITE_OTHER): Payer: 59 | Admitting: Internal Medicine

## 2013-02-26 VITALS — BP 128/85 | HR 73 | Temp 98.7°F | Resp 16 | Ht 60.0 in | Wt 151.0 lb

## 2013-02-26 DIAGNOSIS — M542 Cervicalgia: Secondary | ICD-10-CM | POA: Insufficient documentation

## 2013-02-26 DIAGNOSIS — M5431 Sciatica, right side: Secondary | ICD-10-CM | POA: Insufficient documentation

## 2013-02-26 DIAGNOSIS — M5430 Sciatica, unspecified side: Secondary | ICD-10-CM

## 2013-02-26 MED ORDER — IBUPROFEN 600 MG PO TABS: 600.0000 mg | ORAL_TABLET | Freq: Three times a day (TID) | ORAL | Status: DC | PRN

## 2013-02-26 MED ORDER — CLOTRIMAZOLE-BETAMETHASONE 1-0.05 % EX CREA: 1.0000 "application " | TOPICAL_CREAM | Freq: Two times a day (BID) | CUTANEOUS | Status: DC

## 2013-02-26 NOTE — Progress Notes (Signed)
   Subjective:    Patient ID: [name], female    DOB: [birth year], 53 y.o.   MRN: [id number]  Leg Pain  The incident occurred 3 to 5 days ago. The incident occurred at home (slipped on a rug on 1/30). The injury mechanism was a fall. The pain is present in the right hip, right thigh and right foot. The quality of the pain is described as shooting. The pain is moderate. The pain has been improving since onset. Pertinent negatives include no numbness. The symptoms are aggravated by movement. She has tried NSAIDs for the symptoms. The treatment provided moderate relief.  R leg Neck pain    Review of Systems  Constitutional: Negative for chills, activity change, appetite change, fatigue and unexpected weight change.  HENT: Negative for congestion, mouth sores and sinus pressure.   Eyes: Negative for visual disturbance.  Respiratory: Negative for cough and chest tightness.   Gastrointestinal: Negative for nausea and abdominal pain.  Genitourinary: Negative for frequency, difficulty urinating and vaginal pain.  Musculoskeletal: Positive for back pain and neck pain. Negative for gait problem.  Skin: Negative for pallor and rash.  Neurological: Negative for dizziness, tremors, weakness, numbness and headaches.  Psychiatric/Behavioral: Negative for confusion and sleep disturbance.       Objective:   Physical Exam  Constitutional: She appears well-developed. No distress.  HENT:  Head: Normocephalic.  Right Ear: External ear normal.  Left Ear: External ear normal.  Nose: Nose normal.  Mouth/Throat: Oropharynx is clear and moist.  Eyes: Conjunctivae are normal. Pupils are equal, round, and reactive to light. Right eye exhibits no discharge. Left eye exhibits no discharge.  Neck: Normal range of motion. Neck supple. No JVD present. No tracheal deviation present. No thyromegaly present.  Cardiovascular: Normal rate, regular rhythm and normal heart sounds.   Pulmonary/Chest: No stridor.  No respiratory distress. She has no wheezes.  Abdominal: Soft. Bowel sounds are normal. She exhibits no distension and no mass. There is no tenderness. There is no rebound and no guarding.  Musculoskeletal: She exhibits no edema and no tenderness.  Lymphadenopathy:    She has no cervical adenopathy.  Neurological: She displays normal reflexes. No cranial nerve deficit. She exhibits normal muscle tone. Coordination normal.  Skin: No rash noted. No erythema.  Psychiatric: She has a normal mood and affect. Her behavior is normal. Judgment and thought content normal.          Assessment & Plan:

## 2013-02-26 NOTE — Progress Notes (Signed)
Pre visit review using our clinic review tool, if applicable. No additional management support is needed unless otherwise documented below in the visit note. 

## 2013-02-26 NOTE — Assessment & Plan Note (Signed)
PT offered 

## 2013-02-26 NOTE — Assessment & Plan Note (Signed)
Better  Continue with current prescription therapy as reflected on the Med list. PT if needed

## 2013-03-16 ENCOUNTER — Telehealth: Payer: Self-pay | Admitting: Critical Care Medicine

## 2013-03-16 NOTE — Telephone Encounter (Signed)
Lilli Few, CMA at 03/16/2013 2:25 PM     Status: Signed        Per SN give the pt a zpak, and advise rest, fluids and tylenol. Even though pt called and stated she is going to go to Urgent Care. I LMTCBx1 on home number and atc cell, na no voicemail.    See prev phone from 03-16-13. LMTCBx1.  [place] Bing, CMA

## 2013-03-16 NOTE — Telephone Encounter (Signed)
Patient called back.  She states she is going to UC.  Nothing else needed.

## 2013-03-16 NOTE — Telephone Encounter (Signed)
Spoke with pt. Reports flu like symptoms since yesterday. Chills, dry cough, fatigue and sinus congestion are present. Denies SOB, chest tightness. Took temp it was 98.9. Would like something called in.  Allergies  Allergen Reactions  . Amoxicillin-Pot Clavulanate     REACTION: GI symptoms  . Carvedilol     REACTION: tired  . Cefdinir   . Cephalexin     REACTION: unspecified  . Hydrochlorothiazide     REACTION: unspecified  . Influenza Vaccines     Allergic to flu vaccinations per pt.  Soreness and "cold symptoms" per pt  . Sulfamethoxazole     REACTION: unspecified  . Sulfonamide Derivatives     SN - please advise in PW's absence. Thanks.

## 2013-03-16 NOTE — Telephone Encounter (Signed)
Pt returning call from triage.[name] °

## 2013-03-16 NOTE — Telephone Encounter (Signed)
I spoke with the pt and she went to an urgent care and was diagnosed with an URI and was given a zpak. Nothing further needed. [place] Bing, CMA

## 2013-03-16 NOTE — Telephone Encounter (Signed)
Per SN give the pt a zpak, and advise rest, fluids and tylenol. Even though pt called and stated she is going to go to Urgent Care. I LMTCBx1 on home number and atc cell, na no voicemail.

## 2013-03-17 ENCOUNTER — Ambulatory Visit: Payer: 59 | Admitting: Internal Medicine

## 2013-03-18 ENCOUNTER — Ambulatory Visit (INDEPENDENT_AMBULATORY_CARE_PROVIDER_SITE_OTHER): Payer: 59 | Admitting: Adult Health

## 2013-03-18 ENCOUNTER — Encounter (INDEPENDENT_AMBULATORY_CARE_PROVIDER_SITE_OTHER): Payer: Self-pay

## 2013-03-18 ENCOUNTER — Encounter: Payer: Self-pay | Admitting: Adult Health

## 2013-03-18 ENCOUNTER — Ambulatory Visit (INDEPENDENT_AMBULATORY_CARE_PROVIDER_SITE_OTHER)
Admission: RE | Admit: 2013-03-18 | Discharge: 2013-03-18 | Disposition: A | Discharge status: Home / Self Care | Payer: 59 | Source: Ambulatory Visit | Attending: Adult Health | Admitting: Adult Health

## 2013-03-18 VITALS — BP 132/78 | HR 108 | Temp 99.9°F | Ht 60.5 in | Wt 154.6 lb

## 2013-03-18 DIAGNOSIS — J4540 Moderate persistent asthma, uncomplicated: Secondary | ICD-10-CM

## 2013-03-18 DIAGNOSIS — J45909 Unspecified asthma, uncomplicated: Secondary | ICD-10-CM

## 2013-03-18 NOTE — Patient Instructions (Signed)
Finish Zpack take as directed .  Mucinex DM Twice daily  As needed  Cough/congestion  Fluids and rest  Tylenol As needed  For fever  Chest xray today  follow up Dr. Joya Gaskins  As planned and As needed   Please contact office for sooner follow up if symptoms do not improve or worsen or seek emergency care

## 2013-03-23 ENCOUNTER — Telehealth: Payer: Self-pay | Admitting: Adult Health

## 2013-03-23 MED ORDER — FLUCONAZOLE 150 MG PO TABS: 150.0000 mg | ORAL_TABLET | Freq: Once | ORAL | Status: DC

## 2013-03-23 NOTE — Assessment & Plan Note (Signed)
Flare with URI   Plan  inish Zpack take as directed .  Mucinex DM Twice daily  As needed  Cough/congestion  Fluids and rest  Tylenol As needed  For fever  Chest xray today  follow up Dr. Joya Gaskins  As planned and As needed   Please contact office for sooner follow up if symptoms do not improve or worsen or seek emergency care

## 2013-03-23 NOTE — Progress Notes (Signed)
   Subjective:    Patient ID: [name], female    DOB: March 01, 1960, 53 y.o.   MRN: [id number]  HPI 53 yo AAF  female with a history of sarcoidosis Stage II, and asthma /allergic rhinitis    03/18/13 Acute OV  Complains of  chills, prod cough with thick green/milky colored mucus, head congestion, some wheezing/dyspnea/tightness x3=4 days, fever up to 102 last night, increased fatigue today.   Went to UC 3/3 and given zpak.  denies any hemoptysis, nausea, vomiting.  CXR today shows no acute process.    Review of Systems Constitutional:   No  weight loss, night sweats,   +Fevers, chills, fatigue, or  lassitude.  HEENT:   No headaches,  Difficulty swallowing,  Tooth/dental problems, or  Sore throat,                No sneezing, itching, ear ache,  +nasal congestion, post nasal drip,   CV:  No chest pain,  Orthopnea, PND, swelling in lower extremities, anasarca, dizziness, palpitations, syncope.   GI  No heartburn, indigestion, abdominal pain, nausea, vomiting, diarrhea, change in bowel habits, loss of appetite, bloody stools.   Resp:   No chest wall deformity  Skin: no rash or lesions.  GU: no dysuria, change in color of urine, no urgency or frequency.  No flank pain, no hematuria   MS:  No joint pain or swelling.  No decreased range of motion.  No back pain.  Psych:  No change in mood or affect. No depression or anxiety.  No memory loss.         Objective:   Physical Exam GEN: A/Ox3; pleasant , NAD, well nourished   HEENT:  [place]/AT,  EACs-clear, TMs-wnl, NOSE-clear, THROAT-clear, no lesions, no postnasal drip or exudate noted.   NECK:  Supple w/ fair ROM; no JVD; normal carotid impulses w/o bruits; no thyromegaly or nodules palpated; no lymphadenopathy.  RESP  Clear  P & A; w/o, wheezes/ rales/ or rhonchi.no accessory muscle use, no dullness to percussion  CARD:  RRR, no m/r/g  , no peripheral edema, pulses intact, no cyanosis or clubbing.  GI:   Soft & nt; nml bowel  sounds; no organomegaly or masses detected.  Musco: Warm bil, no deformities or joint swelling noted.   Neuro: alert, no focal deficits noted.    Skin: Warm, no lesions or rashes         Assessment & Plan:

## 2013-03-23 NOTE — Telephone Encounter (Signed)
Pt saw TP 03/18/13: Was given ZPAK --  Pt requesting diflucan rx to [facility]. She has vaginal itching-d/t yeast infection from ABX. Please advise TP thanks  Allergies  Allergen Reactions  . Amoxicillin-Pot Clavulanate     REACTION: GI symptoms  . Carvedilol     REACTION: tired  . Cefdinir   . Cephalexin     REACTION: unspecified  . Hydrochlorothiazide     REACTION: unspecified  . Influenza Vaccines     Allergic to flu vaccinations per pt.  Soreness and "cold symptoms" per pt  . Sulfamethoxazole     REACTION: unspecified  . Sulfonamide Derivatives

## 2013-03-23 NOTE — Progress Notes (Signed)
Quick Note:  Called spoke with patient, advised of cxr results / recs as stated by TP. Pt verbalized her understanding and denied any questions. ______ 

## 2013-03-23 NOTE — Telephone Encounter (Signed)
Per TP: okay for Diflucan 150mg  #1, take once.  Called spoke with patient, advised of TP's recs as above.  Pt verbalized her understanding and denied any questions.  Rx sent to verified pharmacy.  Nothing further needed; will sign off.

## 2013-03-24 ENCOUNTER — Other Ambulatory Visit: Payer: Self-pay | Admitting: Internal Medicine

## 2013-03-26 ENCOUNTER — Ambulatory Visit (INDEPENDENT_AMBULATORY_CARE_PROVIDER_SITE_OTHER): Payer: 59 | Admitting: Internal Medicine

## 2013-03-26 ENCOUNTER — Ambulatory Visit: Payer: 59 | Admitting: Internal Medicine

## 2013-03-26 ENCOUNTER — Ambulatory Visit: Payer: 59

## 2013-03-26 VITALS — BP 128/82 | HR 85 | Temp 98.7°F | Resp 17 | Ht 61.5 in | Wt 151.0 lb

## 2013-03-26 DIAGNOSIS — M25559 Pain in unspecified hip: Secondary | ICD-10-CM

## 2013-03-26 DIAGNOSIS — M25551 Pain in right hip: Secondary | ICD-10-CM

## 2013-03-26 DIAGNOSIS — M5430 Sciatica, unspecified side: Secondary | ICD-10-CM

## 2013-03-26 DIAGNOSIS — M542 Cervicalgia: Secondary | ICD-10-CM

## 2013-03-26 DIAGNOSIS — M533 Sacrococcygeal disorders, not elsewhere classified: Secondary | ICD-10-CM

## 2013-03-26 MED ORDER — MELOXICAM 15 MG PO TABS: 15.0000 mg | ORAL_TABLET | Freq: Every day | ORAL | Status: DC

## 2013-03-26 NOTE — Progress Notes (Signed)
Subjective:    Patient ID: [name], female    DOB: Jun 19, 1960, 53 y.o.   MRN: [id number]  Chief Complaint  Patient presents with  . Hip Pain    post fall in january    This chart was scribed for Betty Lin, MD by Zettie Pho, ED Scribe.   HPI [name] is a 53 y.o. Female with a history of sciatic nerve pain, osteoarthritis, and osteoporosis who presents to [facility] complaining of an intermittent pain to the right hip that she states has been ongoing for the past 1.5 months after a fall. Patient states that the pain will intermittently radiate down the anterior right leg. She states that the pain has been progressively worsening and is exacerbated with walking/bearing weight and with crossing the leg. Patient was seen here on 02/26/2013 for similar complaints after the fall, but did not receive x-rays or medications at that time, and was advised to perform gentle stretching exercises at home. She denies swelling to the area. Patient also has a history of HTN.   Patient is also complaining of a constant pain to the right side of the posterior neck, described as a tightness, that she states has been ongoing for the past few months. She states that the pain is exacerbated with laying down to sleep at night. Patient reports using different types of pillows at night, but that the pain has been persistent. She states that she has been stretching the area at home, but without significant relief. Patient works at a [employer] daily.   Patient Active Problem List   Diagnosis Date Noted  . Right sciatic nerve pain 02/26/2013  . Neck pain on right side 02/26/2013  . Exposure to the flu 01/25/2013  . Fall due to stumbling 07/24/2012  . Onychomycosis 04/27/2012  . Dizziness 01/14/2012  . Abdominal pain, left upper quadrant 09/30/2011  . Irritable bladder 09/02/2011  . Hyperglycemia 09/02/2011  . Nasal turbinate hypertrophy 03/08/2011  . Hypersomnia 09/12/2010    . VAGINITIS 02/27/2010  . Headache 02/13/2010  . TMJ PAIN 10/20/2009  . GRIEF REACTION 02/23/2009  . PERIMENOPAUSAL SYNDROME 05/24/2008  . FATIGUE 12/07/2007  . TACHYCARDIA 12/07/2007  . GANGLION CYST, WRIST, LEFT 07/17/2007  . 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 ALLERGIC RHINITIS 03/24/2007  . OSTEOARTHRITIS 03/24/2007  . HAND PAIN 03/24/2007  . TINEA PEDIS 12/10/2006  . ANXIETY 12/10/2006  . Asthma, moderate persistent 12/10/2006  . DEPRESSION 11/06/2006  . GLAUCOMA NOS 11/06/2006  . HYPERTENSION 11/06/2006  . OSTEOPOROSIS 11/06/2006   Past Medical History  Diagnosis Date  . Osteoporosis   . Hypertension   . Depression   . Glaucoma   . Pulmonary sarcoidosis   . Glaucoma   . Vitamin D deficiency   . Anxiety   . Asthma   . Depression   . Herpes simplex   . Osteoarthritis   . Allergy   . Cataract    Current Outpatient Prescriptions on File Prior to Visit  Medication Sig Dispense Refill  . acyclovir (ZOVIRAX) 5 % cream Apply topically as needed.        [name] amLODipine (NORVASC) 5 MG tablet TAKE 1/2 TO 1 TABLET BY MOUTH ONCE DAILY  30 tablet  5  . Calcium-Magnesium-Vitamin D (SUPER CAL-MAG-D) 500-250-125 MG-MG-UNIT TABS Take by mouth. 1-2 daily      . clotrimazole-betamethasone (LOTRISONE) cream Apply 1 application topically 2 (two) times daily.  90 g  3  . Cod Liver Oil OIL Take  by mouth. 5 times weekly      . Dorzolamide HCl-Timolol Mal (COSOPT OP) 1 drop two times a day in left eye      . fluconazole (DIFLUCAN) 150 MG tablet Take 1 tablet (150 mg total) by mouth once.  1 tablet  0  . fluticasone (FLONASE) 50 MCG/ACT nasal spray Place 2 sprays into the nose daily as needed.       . Hypertonic Nasal Wash (NASAFLO NETI POT NASAL WASH NA) Place into the nose as needed.       . K-EFFERVESCENT 25 MEQ disintegrating tablet DISSOLVE ONE TABLET IN THE MOUTH TWICE A DAY  60 tablet  5  . losartan (COZAAR) 100 MG tablet TAKE 1 TABLET BY MOUTH ONCE A DAY  30 tablet  5  .  mometasone (ASMANEX 60 METERED DOSES) 220 MCG/INH inhaler Inhale 2 puffs into the lungs daily.  1 Inhaler  12  . Multiple Vitamins-Minerals (HAIR/SKIN/NAILS) TABS Take by mouth 2 (two) times daily.       . nebivolol (BYSTOLIC) 10 MG tablet Take 10 mg by mouth daily.      . Pyridoxine HCl (B-6) 100 MG TABS Take 2-3 tablets by mouth daily.       . valACYclovir (VALTREX) 1000 MG tablet 1/2 tablet two times a day      . ibuprofen (ADVIL,MOTRIN) 600 MG tablet Take 1 tablet (600 mg total) by mouth every 8 (eight) hours as needed.  60 tablet  3   No current facility-administered medications on file prior to visit.   Allergies  Allergen Reactions  . Amoxicillin-Pot Clavulanate     REACTION: GI symptoms  . Carvedilol     REACTION: tired  . Cefdinir   . Cephalexin     REACTION: unspecified  . Hydrochlorothiazide     REACTION: unspecified  . Influenza Vaccines     Allergic to flu vaccinations per pt.  Soreness and "cold symptoms" per pt  . Sulfamethoxazole     REACTION: unspecified  . Sulfonamide Derivatives     Review of Systems    As noted in the HPI, otherwise non-contributory.  Objective:   Physical Exam  Nursing note and vitals reviewed. Constitutional: She is oriented to person, place, and time. She appears well-developed and well-nourished. No distress.  HENT:  Head: Normocephalic and atraumatic.  Eyes: Conjunctivae are normal. Pupils are equal, round, and reactive to light.  Neck: Normal range of motion.  She is tender in the right trapezius area and has some stiffness on her range of motion of the neck although it is full/shoulder evaluation is normal/no peripheral neurological changes  Pulmonary/Chest: Effort normal. No respiratory distress.  Abdominal: She exhibits no distension.  Musculoskeletal: Normal range of motion. She exhibits tenderness.  No pain with range of motion of the right knee. Pain with external rotation of the right hip, felt posteriorly. No bony tenderness  to the hip itself. Tenderness to palpation to the right SI joint into the sciatic notch.    Neurological: She is alert and oriented to person, place, and time.  Skin: Skin is warm and dry.  Psychiatric: She has a normal mood and affect. Her behavior is normal.      BP 128/82  Pulse 85  Temp(Src) 98.7 F (37.1 C) (Oral)  Resp 17  Ht 5' 1.5" (1.562 m)  Wt 151 lb (68.493 kg)  BMI 28.07 kg/m2  SpO2 95%  12:45 PM- [facility] preliminary x-ray report read by Dr. Laney Pastor. Negative. There is  a ?nodular density at the acetabulum?  Assessment & Plan:  12:21 PM- Will order an x-ray of the right hip. Discussed that the patient may need to follow up with physical therapy. Discussed that the neck pain is likely muscular in nature and related to her work. Advised patient of proper posture to help with these complaints. Discussed treatment plan with patient at bedside and patient verbalized agreement.   12:47 PM- Discussed that x-ray results were normal, but that the radiologist will review them and notify her of the results. Will start patient on a 3 week Mobic regimen to manage symptoms. Advised patient to follow up with the referral for physical therapy. Patient states that she works on [employer] and would prefer to go to a facility that is near there. Discussed treatment plan with patient at bedside and patient verbalized agreement.    I have completed the patient encounter in its entirety as documented by the scribe, with editing by me where necessary. Alecea Trego P. Laney Pastor, M.D. Right hip pain - Plan: DG Hip Complete Right  -likely due to #3 Neck pain - Plan: Ambulatory referral to Physical Therapy  Sciatica - Plan: Ambulatory referral to Physical Therapy  Meds ordered this encounter  Medications  . meloxicam (MOBIC) 15 MG tablet    Sig: Take 1 tablet (15 mg total) by mouth daily.    Dispense:  30 tablet    Refill:  0   To PT

## 2013-03-28 NOTE — Progress Notes (Signed)
Done

## 2013-03-31 ENCOUNTER — Encounter: Payer: Self-pay | Admitting: Internal Medicine

## 2013-03-31 ENCOUNTER — Ambulatory Visit (INDEPENDENT_AMBULATORY_CARE_PROVIDER_SITE_OTHER): Payer: 59 | Admitting: Internal Medicine

## 2013-03-31 VITALS — BP 138/90 | HR 76 | Temp 96.8°F | Resp 16 | Wt 151.0 lb

## 2013-03-31 DIAGNOSIS — M25551 Pain in right hip: Secondary | ICD-10-CM

## 2013-03-31 DIAGNOSIS — M25559 Pain in unspecified hip: Secondary | ICD-10-CM

## 2013-03-31 MED ORDER — ONDANSETRON HCL 4 MG/2ML IJ SOLN
4.0000 mg | Freq: Once | INTRAMUSCULAR | Status: AC
  Administered 2013-03-31: 4 mg via INTRAMUSCULAR

## 2013-03-31 MED ORDER — MEPERIDINE HCL 50 MG/ML IJ SOLN
50.0000 mg | Freq: Once | INTRAMUSCULAR | Status: AC
  Administered 2013-03-31: 50 mg via INTRAMUSCULAR

## 2013-03-31 MED ORDER — HYDROCODONE-ACETAMINOPHEN 7.5-325 MG PO TABS: 0.5000 | ORAL_TABLET | Freq: Four times a day (QID) | ORAL | Status: DC | PRN

## 2013-03-31 NOTE — Progress Notes (Signed)
Pre visit review using our clinic review tool, if applicable. No additional management support is needed unless otherwise documented below in the visit note. 

## 2013-03-31 NOTE — Patient Instructions (Signed)
Go to ER if worse 

## 2013-03-31 NOTE — Assessment & Plan Note (Signed)
3/15 R/o avasc necrosis Xray: IMPRESSION:  No acute bone abnormality in the right hip. Mild degenerative  changes in the right hip.  Stable sclerotic densities in the pelvis. Findings could represent  bone islands.  MRI today Cont Meloxicam Norco prn

## 2013-03-31 NOTE — Progress Notes (Signed)
   Subjective:   C/o severe R hip pain x 3-4 d - was severe and unable to bear wt on Rt hip on 3/15. X ray: IMPRESSION:  No acute bone abnormality in the right hip. Mild degenerative  changes in the right hip.  Stable sclerotic densities in the pelvis. Findings could represent  bone islands. She was seen at UC: took Meloxicam and Tramadol  Leg Pain  The incident occurred 3 to 5 days ago. The incident occurred at home (slipped on a rug on 1/30). The injury mechanism was a fall. The pain is present in the right hip, right thigh and right foot. The quality of the pain is described as shooting. The pain is moderate. The pain has been improving since onset. Pertinent negatives include no numbness. The symptoms are aggravated by movement. She has tried NSAIDs for the symptoms. The treatment provided moderate relief.  R leg Neck pain    Review of Systems  Constitutional: Negative for chills, activity change, appetite change, fatigue and unexpected weight change.  HENT: Negative for congestion, mouth sores and sinus pressure.   Eyes: Negative for visual disturbance.  Respiratory: Negative for cough and chest tightness.   Gastrointestinal: Negative for nausea and abdominal pain.  Genitourinary: Negative for frequency, difficulty urinating and vaginal pain.  Musculoskeletal: Positive for gait problem and neck pain. Negative for back pain and neck stiffness.  Skin: Negative for pallor and rash.  Neurological: Negative for dizziness, tremors, weakness, numbness and headaches.  Psychiatric/Behavioral: Negative for suicidal ideas, confusion and sleep disturbance.       Objective:   Physical Exam  Constitutional: She appears well-developed. No distress.  HENT:  Head: Normocephalic.  Right Ear: External ear normal.  Left Ear: External ear normal.  Nose: Nose normal.  Mouth/Throat: Oropharynx is clear and moist.  Eyes: Conjunctivae are normal. Pupils are equal, round, and reactive to light.  Right eye exhibits no discharge. Left eye exhibits no discharge.  Neck: Normal range of motion. Neck supple. No JVD present. No tracheal deviation present. No thyromegaly present.  Cardiovascular: Normal rate, regular rhythm and normal heart sounds.   Pulmonary/Chest: No stridor. No respiratory distress. She has no wheezes.  Abdominal: Soft. Bowel sounds are normal. She exhibits no distension and no mass. There is no tenderness. There is no rebound and no guarding.  Musculoskeletal: She exhibits tenderness. She exhibits no edema.  Severe pain in R hip w/ROM  Lymphadenopathy:    She has no cervical adenopathy.  Neurological: She displays normal reflexes. No cranial nerve deficit. She exhibits normal muscle tone. Coordination normal.  str leg elev neg B  Skin: No rash noted. No erythema.  Psychiatric: She has a normal mood and affect. Her behavior is normal. Judgment and thought content normal.          Assessment & Plan:

## 2013-04-01 ENCOUNTER — Telehealth: Payer: Self-pay

## 2013-04-01 ENCOUNTER — Ambulatory Visit
Admission: RE | Admit: 2013-04-01 | Discharge: 2013-04-01 | Disposition: A | Discharge status: Home / Self Care | Payer: 59 | Source: Ambulatory Visit | Attending: Internal Medicine | Admitting: Internal Medicine

## 2013-04-01 DIAGNOSIS — M25551 Pain in right hip: Secondary | ICD-10-CM

## 2013-04-01 NOTE — Telephone Encounter (Signed)
Phone call from patient [phone] wanting to know the exact location of her hip fracture? Dr Linda Hedges reviewed her MRI report and she has an appt Monday to see Dr Tamala Julian. Report is with your CMA.

## 2013-04-01 NOTE — Telephone Encounter (Signed)
Phone call to patient and left a message for her to call back. Need to let her know Dr Linda Hedges reviewed her MRI of her right hip since Dr Alain Marion is out of the office. Per Dr Linda Hedges she does have a stress fracture and she will need to minimize weight bearing. She can make an appt to see Dr Tamala Julian for consultation.

## 2013-04-01 NOTE — Telephone Encounter (Signed)
Patient has been advised and has been transferred to scheduling for an appt with Dr Tamala Julian

## 2013-04-02 ENCOUNTER — Other Ambulatory Visit (INDEPENDENT_AMBULATORY_CARE_PROVIDER_SITE_OTHER): Payer: 59

## 2013-04-02 ENCOUNTER — Encounter: Payer: Self-pay | Admitting: *Deleted

## 2013-04-02 ENCOUNTER — Telehealth: Payer: Self-pay

## 2013-04-02 ENCOUNTER — Ambulatory Visit (INDEPENDENT_AMBULATORY_CARE_PROVIDER_SITE_OTHER): Payer: 59 | Admitting: Family Medicine

## 2013-04-02 ENCOUNTER — Encounter: Payer: Self-pay | Admitting: Family Medicine

## 2013-04-02 VITALS — BP 140/86 | HR 83

## 2013-04-02 DIAGNOSIS — M25551 Pain in right hip: Secondary | ICD-10-CM

## 2013-04-02 DIAGNOSIS — S32409A Unspecified fracture of unspecified acetabulum, initial encounter for closed fracture: Secondary | ICD-10-CM

## 2013-04-02 DIAGNOSIS — M25559 Pain in unspecified hip: Secondary | ICD-10-CM

## 2013-04-02 DIAGNOSIS — S32401A Unspecified fracture of right acetabulum, initial encounter for closed fracture: Secondary | ICD-10-CM | POA: Insufficient documentation

## 2013-04-02 NOTE — Telephone Encounter (Signed)
Betty Anger, MD at 04/02/2013 1:22 PM     Status: Signed        I called [name]. If in a lot of pain by Mon - she may need to stay at home x 1 wk

## 2013-04-02 NOTE — Telephone Encounter (Signed)
I called [facility]. If in a lot of pain by Mon - she may need to stay at home x 1 wk

## 2013-04-02 NOTE — Progress Notes (Signed)
  [name] [place], [place] [address] Phone: [phone] Subjective:    I'm seeing this patient by the request  of:  Walker Kehr, MD   CC: Right hip pain  [name] is a 53 y.o. female coming in with complaint of  Right hip pain. Patient has had this pain for quite some time. Patient states over the course last 2 weeks it has started to get severely worse. Patient does not remember any true injury but unfortunately was sick with a stomach bug in cause her to vomit outside a car window inpatient notice pain at that time. Patient has had a significant difficulty with ambulation. Patient describes the pain as more of a dull aching sensation that is at all times the sharp pain with weightbearing. Patient is most of the pain is in the groin and seems to radiate to the posterior aspect. Patient was having such difficulty that primary care provider did get x-rays as well as an MRI. MRI was reviewed by me that stated that there was possibly a acetabular stress fracture. I believe the patient also has a labral tear that seems anterior to posterior and fairly large causing a stress fracture from the insertion of the labral. Patient does have a past medical history significant for sarcoidosis that is in remission at the moment.    Past medical history, social, surgical and family history all reviewed in electronic medical record.   Review of Systems: No headache, visual changes, nausea, vomiting, diarrhea, constipation, dizziness, abdominal pain, skin rash, fevers, chills, night sweats, weight loss, swollen lymph nodes, body aches, joint swelling, muscle aches, chest pain, shortness of breath, mood changes.   Objective Blood pressure 140/86, pulse 83, SpO2 98.00%.  General: No apparent distress alert and oriented x3 mood and affect normal, dressed appropriately.  HEENT: Pupils equal, extraocular movements intact  Respiratory: Patient's  speak in full sentences and does not appear short of breath  Cardiovascular: No lower extremity edema, non tender, no erythema  Skin: Warm dry intact with no signs of infection or rash on extremities or on axial skeleton.  Abdomen: Soft nontender  Neuro: Cranial nerves II through XII are intact, neurovascularly intact in all extremities with 2+ DTRs and 2+ pulses.  Lymph: No lymphadenopathy of posterior or anterior cervical chain or axillae bilaterally.  Gait antalgic gait.  MSK:  Non tender with full range of motion and good stability and symmetric strength and tone of shoulders, elbows, wrist, knee and ankles bilaterally.  Hip: right  ROM IR: 15 Deg with severe pain, ER: 25 Deg, Flexion: 90 Deg, Extension: 40 Deg, Abduction: 35 Deg, Adduction: 35 Deg Strength IR: 3/5, ER: 5/5, Flexion: 3/5, Extension: 4/5, Abduction: 3/5, Adduction: 3/5 Pelvic alignment unremarkable to inspection and palpation. Antalgic gait Greater trochanter without tenderness to palpation. No tenderness over piriformis and greater trochanter. Severe pain with FABER and extreme severe with FADIR. No SI joint tenderness and normal minimal SI movement.  MSK US performed of: Right This study was ordered, performed, and interpreted by Charlann Boxer D.O.  Hip: Trochanteric bursa without swelling or effusion. 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  IMPRESSION:  Joint effusion with likely labral tear    Impression and Recommendations:     This case required medical decision making of moderate complexity.

## 2013-04-02 NOTE — Assessment & Plan Note (Signed)
I do believe the patient does have an acetabular stress fracture that is likely no secondary to labral tear. I think that as seen on ultrasound today with effusion of the joint. Patient was given multiple different treatment options and has decided a more conservative approach. We'll make patient very much nonweightbearing over the course of the next 2 weeks. Patient was given a note for work this a seated work only. Medications per orders. We discussed icing protocol that could also be beneficial. If patient was back in 2 weeks in continuing to have pain we'll discuss other such things as range of motion exercises as well as other medications that could be beneficial. I would like to wait for patient to be at least 4-6 weeks out from injury before we decide that possibly doing a diagnostic injection. I do not think patient will need surgery at this time for we may want to consider getting further imaging such as a CT scan to make sure that there is healing in approximately 6 weeks' time. We'll discuss this further with patient at followup. Patient was given crutches to remain nonweightbearing at this time.

## 2013-04-02 NOTE — Patient Instructions (Signed)
Good ot meet you Continue pain medicine as needed Ice 20 minutes 2 times a day can help Try to stay off leg as much as you can for next 10 days Seated work only.  Stop the meloxicam for now.  Come back and we will scan you again  But if still in pain we can consider injection.

## 2013-04-02 NOTE — Telephone Encounter (Signed)
Pt called again.  She wants a call from [place] to tell her where the fracture is.  She wants to know which way to lay in the bed to keep from hurting her hip.

## 2013-04-02 NOTE — Telephone Encounter (Signed)
The patient called and is hoping to get information from the [employer] about her going back to work.   Callback - [phone]

## 2013-04-05 ENCOUNTER — Ambulatory Visit: Payer: 59 | Admitting: Family Medicine

## 2013-04-08 ENCOUNTER — Telehealth: Payer: Self-pay | Admitting: *Deleted

## 2013-04-08 DIAGNOSIS — S32401A Unspecified fracture of right acetabulum, initial encounter for closed fracture: Secondary | ICD-10-CM

## 2013-04-08 NOTE — Telephone Encounter (Signed)
Pt called requesting an order placed for a Knee Walker sent to [facility] in [place].  [phone] phone.  Please advise

## 2013-04-09 NOTE — Telephone Encounter (Signed)
Order entered & discussed with [facility]. They will contact pt.

## 2013-04-09 NOTE — Telephone Encounter (Signed)
This will be fine.

## 2013-04-12 ENCOUNTER — Encounter: Payer: Self-pay | Admitting: *Deleted

## 2013-04-12 ENCOUNTER — Ambulatory Visit (INDEPENDENT_AMBULATORY_CARE_PROVIDER_SITE_OTHER): Payer: 59 | Admitting: Family Medicine

## 2013-04-12 ENCOUNTER — Ambulatory Visit (INDEPENDENT_AMBULATORY_CARE_PROVIDER_SITE_OTHER): Payer: 59 | Admitting: Internal Medicine

## 2013-04-12 ENCOUNTER — Encounter: Payer: Self-pay | Admitting: Family Medicine

## 2013-04-12 VITALS — BP 140/86 | HR 83

## 2013-04-12 VITALS — BP 124/82 | HR 71

## 2013-04-12 DIAGNOSIS — M25559 Pain in unspecified hip: Secondary | ICD-10-CM

## 2013-04-12 DIAGNOSIS — F329 Major depressive disorder, single episode, unspecified: Secondary | ICD-10-CM

## 2013-04-12 DIAGNOSIS — S32409A Unspecified fracture of unspecified acetabulum, initial encounter for closed fracture: Secondary | ICD-10-CM

## 2013-04-12 DIAGNOSIS — J4540 Moderate persistent asthma, uncomplicated: Secondary | ICD-10-CM

## 2013-04-12 DIAGNOSIS — F3289 Other specified depressive episodes: Secondary | ICD-10-CM

## 2013-04-12 DIAGNOSIS — E876 Hypokalemia: Secondary | ICD-10-CM | POA: Insufficient documentation

## 2013-04-12 DIAGNOSIS — J45909 Unspecified asthma, uncomplicated: Secondary | ICD-10-CM

## 2013-04-12 DIAGNOSIS — S32401A Unspecified fracture of right acetabulum, initial encounter for closed fracture: Secondary | ICD-10-CM

## 2013-04-12 DIAGNOSIS — M25551 Pain in right hip: Secondary | ICD-10-CM

## 2013-04-12 DIAGNOSIS — M810 Age-related osteoporosis without current pathological fracture: Secondary | ICD-10-CM

## 2013-04-12 NOTE — Patient Instructions (Signed)
Good to see you and I am glad you are doing better.  Start physical therapy in 1 week  Par-time at work for 1 week then full duty for 1 week Continue the icing after exercises  Continue the vitamin D but on its own at 2000 IU daily.  Come back in 2 weeks.

## 2013-04-12 NOTE — Assessment & Plan Note (Signed)
Much better 

## 2013-04-12 NOTE — Progress Notes (Addendum)
   Subjective:   F/u severe R hip pain x 2 wks. Much better now    Leg Pain  The incident occurred 3 to 5 days ago. The incident occurred at home (slipped on a rug on 1/30). The injury mechanism was a fall. The pain is present in the right hip, right thigh and right foot. The quality of the pain is described as shooting. The patient is experiencing no pain. The pain has been improving since onset. Pertinent negatives include no loss of sensation or numbness. The symptoms are aggravated by movement. She has tried NSAIDs for the symptoms. The treatment provided significant relief.  R leg Neck pain    Review of Systems  Constitutional: Negative for chills, activity change, appetite change, fatigue and unexpected weight change.  HENT: Negative for congestion, mouth sores and sinus pressure.   Eyes: Negative for visual disturbance.  Respiratory: Negative for cough and chest tightness.   Gastrointestinal: Negative for nausea and abdominal pain.  Genitourinary: Negative for frequency, difficulty urinating and vaginal pain.  Musculoskeletal: Negative for back pain, gait problem, neck pain and neck stiffness.  Skin: Negative for pallor and rash.  Neurological: Negative for dizziness, tremors, weakness, numbness and headaches.  Psychiatric/Behavioral: Negative for suicidal ideas, confusion and sleep disturbance.       Objective:   Physical Exam  Constitutional: She appears well-developed. No distress.  HENT:  Head: Normocephalic.  Right Ear: External ear normal.  Left Ear: External ear normal.  Nose: Nose normal.  Mouth/Throat: Oropharynx is clear and moist.  Eyes: Conjunctivae are normal. Pupils are equal, round, and reactive to light. Right eye exhibits no discharge. Left eye exhibits no discharge.  Neck: Normal range of motion. Neck supple. No JVD present. No tracheal deviation present. No thyromegaly present.  Cardiovascular: Normal rate, regular rhythm and normal heart sounds.     Pulmonary/Chest: No stridor. No respiratory distress. She has no wheezes.  Abdominal: Soft. Bowel sounds are normal. She exhibits no distension and no mass. There is no tenderness. There is no rebound and no guarding.  Musculoskeletal: She exhibits tenderness. She exhibits no edema.  Much less pain in R hip w/ROM  Lymphadenopathy:    She has no cervical adenopathy.  Neurological: She displays normal reflexes. No cranial nerve deficit. She exhibits normal muscle tone. Coordination normal.  str leg elev neg B  Skin: No rash noted. No erythema.  Psychiatric: She has a normal mood and affect. Her behavior is normal. Judgment and thought content normal.    MRI      Assessment & Plan:

## 2013-04-12 NOTE — Assessment & Plan Note (Addendum)
BID Pills upset stomach Try potassium qd Labs in April

## 2013-04-12 NOTE — Assessment & Plan Note (Signed)
Vit D 

## 2013-04-12 NOTE — Progress Notes (Deleted)
Pre visit review using our clinic review tool, if applicable. No additional management support is needed unless otherwise documented below in the visit note. 

## 2013-04-12 NOTE — Progress Notes (Signed)
  [name] [place], [place] [address] Phone: [phone] Subjective:    CC: Right hip pain follow up  [id number] [name] is a 53 y.o. female coming in with complaint of  Right hip pain . Patient was seen before did have a posterior acetabular stress fracture as well as A. anterior to posterior labral tear with a joint effusion noted on ultrasound previously. Patient had an MRI confirming the acetabular stress fracture. Patient has not been working for the last week but has been doing vitamin D and the pain medications of the last 40 hours. Patient states the last 48 hours though she is been approximately 90% better. Patient has been able to ambulate without any significant discomfort but has not tried stairs or any long walking. Patient states that she is able to sleep comfortably as well as sit without any significant pain. Patient denies any new symptoms.    Past medical history, social, surgical and family history all reviewed in electronic medical record.   Review of Systems: No headache, visual changes, nausea, vomiting, diarrhea, constipation, dizziness, abdominal pain, skin rash, fevers, chills, night sweats, weight loss, swollen lymph nodes, body aches, joint swelling, muscle aches, chest pain, shortness of breath, mood changes.   Objective Blood pressure 124/82, pulse 71, SpO2 97.00%.  General: No apparent distress alert and oriented x3 mood and affect normal, dressed appropriately.  HEENT: Pupils equal, extraocular movements intact  Respiratory: Patient's speak in full sentences and does not appear short of breath  Cardiovascular: No lower extremity edema, non tender, no erythema  Skin: Warm dry intact with no signs of infection or rash on extremities or on axial skeleton.  Abdomen: Soft nontender  Neuro: Cranial nerves II through XII are intact, neurovascularly intact in all extremities with 2+ DTRs and 2+ pulses.  Lymph: No  lymphadenopathy of posterior or anterior cervical chain or axillae bilaterally.  Gait antalgic gait.  MSK:  Non tender with full range of motion and good stability and symmetric strength and tone of shoulders, elbows, wrist, knee and ankles bilaterally.  Hip: right  ROM IR: 20 Deg with severe pain, ER: 25 Deg, Flexion: 90 Deg, Extension: 40 Deg, Abduction: 35 Deg, Adduction: 35 Deg Strength IR: 3/5, ER: 5/5, Flexion: 4/5, Extension: 4/5, Abduction: 4/5, Adduction: 3/5 Pelvic alignment unremarkable to inspection and palpation. Antalgic gait Greater trochanter without tenderness to palpation. No tenderness over piriformis and greater trochanter. Severe pain with FABER and extreme severe with FADIR. No SI joint tenderness and normal minimal SI movement.      Impression and Recommendations:     This case required medical decision making of moderate complexity.

## 2013-04-12 NOTE — Assessment & Plan Note (Addendum)
Disability forms were filled out - complex Continue with current prn prescription therapy as reflected on the Med list.

## 2013-04-12 NOTE — Assessment & Plan Note (Signed)
Patient has made some improvement in the last week. Patient will start formal physical therapy in one week which I think will be beneficial. Patient is to start work again and part-time for 4 hours a day increasing on the second week for full duty. Patient will come back in 2 weeks and if she is doing well we will release her. Patient continues to have any type of this discomfort in 2 weeks with that been 4 weeks since the diagnosis of the stress fracture I would consider an intra-articular injection with a steroid but I would be more inclined to do this after having a CT scan of the pelvis area to make sure the right acetabular fracture is improving. We'll discuss further and evaluate further at followup in 2 weeks.  Spent greater than 25 minutes with patient face-to-face and had greater than 50% of counseling including as described above in assessment and plan.

## 2013-04-12 NOTE — Assessment & Plan Note (Signed)
Continue with current prescription therapy as reflected on the Med list.  

## 2013-04-12 NOTE — Assessment & Plan Note (Signed)
Not on rx 

## 2013-04-19 ENCOUNTER — Telehealth: Payer: Self-pay | Admitting: Internal Medicine

## 2013-04-19 ENCOUNTER — Ambulatory Visit: Payer: 59 | Attending: Internal Medicine

## 2013-04-19 DIAGNOSIS — R293 Abnormal posture: Secondary | ICD-10-CM | POA: Insufficient documentation

## 2013-04-19 DIAGNOSIS — M2550 Pain in unspecified joint: Secondary | ICD-10-CM | POA: Insufficient documentation

## 2013-04-19 DIAGNOSIS — R5381 Other malaise: Secondary | ICD-10-CM | POA: Insufficient documentation

## 2013-04-19 DIAGNOSIS — IMO0001 Reserved for inherently not codable concepts without codable children: Secondary | ICD-10-CM | POA: Insufficient documentation

## 2013-04-19 DIAGNOSIS — M2560 Stiffness of unspecified joint, not elsewhere classified: Secondary | ICD-10-CM | POA: Insufficient documentation

## 2013-04-19 NOTE — Telephone Encounter (Signed)
Patient states that she is scheduled for a 3 hour round trip drive for work this week and wants to know if Dr. Alain Marion thinks this is okay being that she is still going to physical therapy recovering from her hip issue. Please advise.

## 2013-04-19 NOTE — Telephone Encounter (Signed)
If possible - it is best to avoid such a long trip for another 2 weeks Thx

## 2013-04-20 NOTE — Telephone Encounter (Signed)
LMOVM for pt to return call 

## 2013-04-20 NOTE — Telephone Encounter (Signed)
Pt called again.  She is requesting whether she can ride as a passenger to [place] [employer].  If she is not to take the trip she is requesting a letter be mailed to her.  Please advise

## 2013-04-21 ENCOUNTER — Encounter: Payer: 59 | Admitting: Rehabilitation

## 2013-04-21 NOTE — Telephone Encounter (Signed)
Spoke with pt advised of MDs message 

## 2013-04-21 NOTE — Telephone Encounter (Signed)
Passenger to [place] is ok. Use back seat if in pain Thx

## 2013-04-22 ENCOUNTER — Ambulatory Visit: Payer: 59

## 2013-04-23 ENCOUNTER — Telehealth: Payer: Self-pay | Admitting: Internal Medicine

## 2013-04-23 NOTE — Telephone Encounter (Signed)
Pt needs a note that she cannot drive to [place].  No other co-workers are going, so she would be by herself.  Please call when a note is ready. She is seeing Dr. Tamala Julian Monday and can pick it up then.

## 2013-04-23 NOTE — Telephone Encounter (Signed)
pls write a letter Thx 

## 2013-04-26 ENCOUNTER — Encounter: Payer: Self-pay | Admitting: Internal Medicine

## 2013-04-26 ENCOUNTER — Encounter: Payer: Self-pay | Admitting: *Deleted

## 2013-04-26 ENCOUNTER — Ambulatory Visit: Payer: 59 | Admitting: Internal Medicine

## 2013-04-26 ENCOUNTER — Ambulatory Visit: Payer: 59

## 2013-04-26 ENCOUNTER — Ambulatory Visit: Payer: 59 | Admitting: Physical Therapy

## 2013-04-26 ENCOUNTER — Encounter: Payer: Self-pay | Admitting: Family Medicine

## 2013-04-26 ENCOUNTER — Ambulatory Visit (INDEPENDENT_AMBULATORY_CARE_PROVIDER_SITE_OTHER): Payer: 59 | Admitting: Family Medicine

## 2013-04-26 VITALS — BP 132/84 | HR 75

## 2013-04-26 DIAGNOSIS — S32401A Unspecified fracture of right acetabulum, initial encounter for closed fracture: Secondary | ICD-10-CM

## 2013-04-26 DIAGNOSIS — S32409A Unspecified fracture of unspecified acetabulum, initial encounter for closed fracture: Secondary | ICD-10-CM

## 2013-04-26 NOTE — Telephone Encounter (Signed)
Letter written. Pt picked up.

## 2013-04-26 NOTE — Assessment & Plan Note (Addendum)
Patient is improving slowly. However like her to increase her vitamin D to 4000 units for the next 2 weeks in the shadow of her sarcoidosis. We discussed decreasing strengthening exercises for another 2 weeks in physical therapy. Patient will concentrate on range of motion exercises. We discussed starting her on her treadmill again twice a week to see if she can tolerate it. Patient will avoid long distance driving and having her sit in one position for a long amount of time. Patient and will come back again in 4 weeks. At that time with this being closer to 2 months since diagnosis we may want to consider further imaging such as a CT scan to make sure there is good healing. Think a CT scan would be better than the MRI to show bony healing.  Spent greater than 25 minutes with patient face-to-face and had greater than 50% of counseling including as described above in assessment and plan.

## 2013-04-26 NOTE — Patient Instructions (Signed)
Good to see you and you are doing better Two notes today no driving too far.  Continue exercsies and physical therapy but focus on range of motion next 2 weeks and no strengthen exercises.  Treadmill 2 times a week for max of 30 minute Increase vitamin D to 4000IU daily for 2 weeks Come back in 4 weeks and we will discuss what else we should do such as MRI or CT scan.

## 2013-04-26 NOTE — Progress Notes (Signed)
  [name] [place], [place] [address] Phone: [phone] Subjective:    CC: Right hip pain follow up  [id number] [name] is a 53 y.o. female coming in with complaint of  Right hip pain . Patient was seen before did have a posterior acetabular stress fracture as well as a anterior to posterior labral tear with a joint effusion noted on ultrasound previously. Patient had an MRI confirming the acetabular stress fracture.    Past medical history, social, surgical and family history all reviewed in electronic medical record.   Review of Systems: No headache, visual changes, nausea, vomiting, diarrhea, constipation, dizziness, abdominal pain, skin rash, fevers, chills, night sweats, weight loss, swollen lymph nodes, body aches, joint swelling, muscle aches, chest pain, shortness of breath, mood changes.   Objective Blood pressure 132/84, pulse 75, SpO2 95.00%.  General: No apparent distress alert and oriented x3 mood and affect normal, dressed appropriately.  HEENT: Pupils equal, extraocular movements intact  Respiratory: Patient's speak in full sentences and does not appear short of breath  Cardiovascular: No lower extremity edema, non tender, no erythema  Skin: Warm dry intact with no signs of infection or rash on extremities or on axial skeleton.  Abdomen: Soft nontender  Neuro: Cranial nerves II through XII are intact, neurovascularly intact in all extremities with 2+ DTRs and 2+ pulses.  Lymph: No lymphadenopathy of posterior or anterior cervical chain or axillae bilaterally.  Gait antalgic gait.  MSK:  Non tender with full range of motion and good stability and symmetric strength and tone of shoulders, elbows, wrist, knee and ankles bilaterally.  Hip: right  ROM IR: 25 Deg with severe pain, ER: 30 Deg, Flexion: 100 Deg, Extension: 40 Deg, Abduction: 45 Deg, Adduction: 35 Deg Strength IR: 4/5, ER: 5/5, Flexion: 4/5, Extension:  4/5, Abduction: 4/5, Adduction: 3/5 Pelvic alignment unremarkable to inspection and palpation. Antalgic gait Greater trochanter without tenderness to palpation. No tenderness over piriformis and greater trochanter. Mild pain with FABER and  severe with FADIR. No SI joint tenderness and normal minimal SI movement.      Impression and Recommendations:     This case required medical decision making of moderate complexity.

## 2013-04-28 ENCOUNTER — Ambulatory Visit: Payer: 59 | Admitting: Physical Therapy

## 2013-04-29 ENCOUNTER — Telehealth: Payer: Self-pay | Admitting: Internal Medicine

## 2013-04-29 NOTE — Telephone Encounter (Signed)
FYI: Pt wants Dr. Alain Marion to know that her right hip is hurting on the side now.  Not a bad pain, but a different area.  She climbed 12 steps the night before it started hurting.

## 2013-04-30 NOTE — Telephone Encounter (Signed)
Noted. Pls keep ROV w/Dr Tamala Julian. Rest on the weekend. Thx

## 2013-04-30 NOTE — Telephone Encounter (Signed)
Pt is aware.  

## 2013-05-03 ENCOUNTER — Other Ambulatory Visit: Payer: Self-pay | Admitting: Internal Medicine

## 2013-05-04 ENCOUNTER — Ambulatory Visit: Payer: 59 | Admitting: Physical Therapy

## 2013-05-07 ENCOUNTER — Ambulatory Visit: Payer: 59

## 2013-05-11 ENCOUNTER — Encounter: Payer: 59 | Admitting: Rehabilitation

## 2013-05-11 ENCOUNTER — Ambulatory Visit: Payer: 59 | Admitting: Rehabilitation

## 2013-05-14 ENCOUNTER — Ambulatory Visit (INDEPENDENT_AMBULATORY_CARE_PROVIDER_SITE_OTHER): Payer: 59 | Admitting: Family Medicine

## 2013-05-14 ENCOUNTER — Telehealth: Payer: Self-pay | Admitting: Internal Medicine

## 2013-05-14 ENCOUNTER — Encounter: Payer: Self-pay | Admitting: Family Medicine

## 2013-05-14 VITALS — BP 124/82 | HR 73 | Wt 150.0 lb

## 2013-05-14 DIAGNOSIS — M76899 Other specified enthesopathies of unspecified lower limb, excluding foot: Secondary | ICD-10-CM

## 2013-05-14 DIAGNOSIS — S32409A Unspecified fracture of unspecified acetabulum, initial encounter for closed fracture: Secondary | ICD-10-CM

## 2013-05-14 DIAGNOSIS — M7061 Trochanteric bursitis, right hip: Secondary | ICD-10-CM | POA: Insufficient documentation

## 2013-05-14 DIAGNOSIS — S32401A Unspecified fracture of right acetabulum, initial encounter for closed fracture: Secondary | ICD-10-CM

## 2013-05-14 MED ORDER — DICLOFENAC SODIUM 2 % TD SOLN: 2.0000 "application " | Freq: Two times a day (BID) | TRANSDERMAL | Status: DC

## 2013-05-14 NOTE — Telephone Encounter (Signed)
Patient came to check out and states that she forgot to ask Dr. Tamala Julian if he still wanted to so a CT or MRI as discussed at her previous office visit before today. Please advise.

## 2013-05-14 NOTE — Assessment & Plan Note (Signed)
Patient is doing significantly well with this at this time. The discussed the possibility of advanced imaging to make sure that there is healing but on exam and patient's history shows that she is improving and I do not think that this would change medical management at this time. Patient encouraged to continue with physical therapy and to be sessions are over. Patient will continue to monitor and if any further pain we will then consider changing treatment options.

## 2013-05-14 NOTE — Telephone Encounter (Signed)
Left detailed msg on pt's vmail.  

## 2013-05-14 NOTE — Progress Notes (Signed)
  [name] [place], [place] [address] Phone: [phone] Subjective:    CC: Right hip pain follow up  [id number] [name] is a 53 y.o. female coming in with complaint of  Right hip pain . Patient was seen before did have a posterior acetabular stress fracture as well as a anterior to posterior labral tear with a joint effusion noted on ultrasound previously. Patient had an MRI confirming the acetabular stress fracture. Patient is no been doing physical therapy for quite some time and states that she is doing much better. Patient is able to ambulate without any significant discomfort on the posterior aspect of the hip. Overall she's been feeling much better and can sit for longer amount of time without too much pain. Patient still is avoiding lifting heavy. Patient states that her start cord has been in good control.  Patient has noticed a new pain no on this hip. Patient states the pain is on the lateral aspect of the hip. Can radiate down the lateral aspect of the leg it is worse with stairs. There is any injury states that his pain is completely different to didn't the previous pain. States that his heart he can lay on that side. Describes it more as a dull aching pain in severity a 4/10.    Past medical history, social, surgical and family history all reviewed in electronic medical record.   Review of Systems: No headache, visual changes, nausea, vomiting, diarrhea, constipation, dizziness, abdominal pain, skin rash, fevers, chills, night sweats, weight loss, swollen lymph nodes, body aches, joint swelling, muscle aches, chest pain, shortness of breath, mood changes.   Objective Blood pressure 124/82, pulse 73, weight 150 lb (68.04 kg), SpO2 97.00%.  General: No apparent distress alert and oriented x3 mood and affect normal, dressed appropriately.  HEENT: Pupils equal, extraocular movements intact  Respiratory: Patient's speak in full  sentences and does not appear short of breath  Cardiovascular: No lower extremity edema, non tender, no erythema  Skin: Warm dry intact with no signs of infection or rash on extremities or on axial skeleton.  Abdomen: Soft nontender  Neuro: Cranial nerves II through XII are intact, neurovascularly intact in all extremities with 2+ DTRs and 2+ pulses.  Lymph: No lymphadenopathy of posterior or anterior cervical chain or axillae bilaterally.  Gait antalgic gait.  MSK:  Non tender with full range of motion and good stability and symmetric strength and tone of shoulders, elbows, wrist, knee and ankles bilaterally.  Hip: right  ROM IR: 25 Deg with severe pain, ER: 30 Deg, Flexion: 100 Deg, Extension: 40 Deg, Abduction: 45 Deg, Adduction: 35 Deg Strength IR: 4/5, ER: 5/5, Flexion: 4/5, Extension: 4/5, Abduction: 4/5, Adduction: 3/5 Pelvic alignment unremarkable to inspection and palpation. Antalgic gait Greater trochanter with moderate tenderness to palpation. No tenderness over piriformis and greater trochanter. Mild pain with FABER and  severe with FADIR. No SI joint tenderness and normal minimal SI movement.      Impression and Recommendations:     This case required medical decision making of moderate complexity.

## 2013-05-14 NOTE — Assessment & Plan Note (Addendum)
Patient does have what appears to be greater trochanteric bursitis given her some iliotibial band syndrome. We will add on special exercises was given home exercise program and will do more exercises and physical therapy. We'll do a trial of topical anti-inflammatories. We will avoid a steroid injection secondary to her sharper doses. Patient is not better in 4 weeks we'll consider half the dose if patient is doing pain.  Spent greater than 25 minutes with patient face-to-face and had greater than 50% of counseling including as described above in assessment and plan.

## 2013-05-14 NOTE — Patient Instructions (Addendum)
Good to see you and happy to see you are doing better.  Focus more on the 2 handouts and you can show physical therapy.  They may add on anything else they think will help Try the pennsaid twice daily  Ice 20 minutes to side of hip can help.  Come back in 4 weeks if still in pain come back in 4 weeks.

## 2013-05-18 ENCOUNTER — Ambulatory Visit: Payer: 59 | Attending: Internal Medicine

## 2013-05-18 ENCOUNTER — Other Ambulatory Visit (INDEPENDENT_AMBULATORY_CARE_PROVIDER_SITE_OTHER): Payer: 59

## 2013-05-18 DIAGNOSIS — R5381 Other malaise: Secondary | ICD-10-CM | POA: Insufficient documentation

## 2013-05-18 DIAGNOSIS — M810 Age-related osteoporosis without current pathological fracture: Secondary | ICD-10-CM

## 2013-05-18 DIAGNOSIS — IMO0001 Reserved for inherently not codable concepts without codable children: Secondary | ICD-10-CM | POA: Insufficient documentation

## 2013-05-18 DIAGNOSIS — M25559 Pain in unspecified hip: Secondary | ICD-10-CM

## 2013-05-18 DIAGNOSIS — E876 Hypokalemia: Secondary | ICD-10-CM

## 2013-05-18 DIAGNOSIS — M25551 Pain in right hip: Secondary | ICD-10-CM

## 2013-05-18 DIAGNOSIS — F3289 Other specified depressive episodes: Secondary | ICD-10-CM

## 2013-05-18 DIAGNOSIS — S32409A Unspecified fracture of unspecified acetabulum, initial encounter for closed fracture: Secondary | ICD-10-CM

## 2013-05-18 DIAGNOSIS — J4540 Moderate persistent asthma, uncomplicated: Secondary | ICD-10-CM

## 2013-05-18 DIAGNOSIS — M2550 Pain in unspecified joint: Secondary | ICD-10-CM | POA: Insufficient documentation

## 2013-05-18 DIAGNOSIS — J45909 Unspecified asthma, uncomplicated: Secondary | ICD-10-CM

## 2013-05-18 DIAGNOSIS — F329 Major depressive disorder, single episode, unspecified: Secondary | ICD-10-CM

## 2013-05-18 DIAGNOSIS — M2560 Stiffness of unspecified joint, not elsewhere classified: Secondary | ICD-10-CM | POA: Insufficient documentation

## 2013-05-18 DIAGNOSIS — S32401A Unspecified fracture of right acetabulum, initial encounter for closed fracture: Secondary | ICD-10-CM

## 2013-05-18 DIAGNOSIS — R293 Abnormal posture: Secondary | ICD-10-CM | POA: Insufficient documentation

## 2013-05-18 LAB — BASIC METABOLIC PANEL
BUN: 20 mg/dL (ref 6–23)
CALCIUM: 10.8 mg/dL — AB (ref 8.4–10.5)
CO2: 30 mEq/L (ref 19–32)
CREATININE: 1.1 mg/dL (ref 0.4–1.2)
Chloride: 103 mEq/L (ref 96–112)
GFR: 64.58 mL/min (ref >60.00)
Glucose, Bld: 91 mg/dL (ref 70–99)
Potassium: 3.8 mEq/L (ref 3.5–5.1)
Sodium: 141 mEq/L (ref 135–145)

## 2013-05-18 LAB — CBC WITH DIFFERENTIAL/PLATELET
BASOS PCT: 0.6 % (ref 0.0–3.0)
Basophils Absolute: 0 10*3/uL (ref 0.0–0.1)
EOS PCT: 2.9 % (ref 0.0–5.0)
Eosinophils Absolute: 0.2 10*3/uL (ref 0.0–0.7)
HCT: 40.5 % (ref 36.0–46.0)
Hemoglobin: 13.6 g/dL (ref 12.0–15.0)
Lymphocytes Relative: 28.2 % (ref 12.0–46.0)
Lymphs Abs: 1.7 10*3/uL (ref 0.7–4.0)
MCHC: 33.5 g/dL (ref 30.0–36.0)
MCV: 93.2 fl (ref 78.0–100.0)
Monocytes Absolute: 0.6 10*3/uL (ref 0.1–1.0)
Monocytes Relative: 10 % (ref 3.0–12.0)
NEUTROS PCT: 57.8 % (ref 43.0–77.0)
Neutro Abs: 3.5 10*3/uL (ref 1.4–7.7)
Platelets: 315 10*3/uL (ref 150.0–400.0)
RBC: 4.35 Mil/uL (ref 3.87–5.11)
RDW: 14.6 % (ref 11.5–14.6)
WBC: 6.1 10*3/uL (ref 4.5–10.5)

## 2013-05-18 LAB — HEPATIC FUNCTION PANEL
ALT: 38 U/L — ABNORMAL HIGH (ref 0–35)
AST: 39 U/L — AB (ref 0–37)
Albumin: 4 g/dL (ref 3.5–5.2)
Alkaline Phosphatase: 79 U/L (ref 39–117)
BILIRUBIN TOTAL: 0.6 mg/dL (ref 0.2–1.2)
Bilirubin, Direct: 0.1 mg/dL (ref 0.0–0.3)
Total Protein: 7.5 g/dL (ref 6.0–8.3)

## 2013-05-18 LAB — HEMOGLOBIN A1C: Hgb A1c MFr Bld: 5.9 % (ref 4.6–6.5)

## 2013-05-18 LAB — TSH: TSH: 2.81 u[IU]/mL (ref 0.35–4.50)

## 2013-05-19 LAB — VITAMIN D 25 HYDROXY (VIT D DEFICIENCY, FRACTURES): Vit D, 25-Hydroxy: 59 ng/mL (ref 30–89)

## 2013-05-20 ENCOUNTER — Ambulatory Visit: Payer: 59

## 2013-05-21 ENCOUNTER — Ambulatory Visit (INDEPENDENT_AMBULATORY_CARE_PROVIDER_SITE_OTHER): Payer: 59 | Admitting: Internal Medicine

## 2013-05-21 ENCOUNTER — Encounter: Payer: Self-pay | Admitting: Internal Medicine

## 2013-05-21 VITALS — BP 128/82 | HR 80 | Temp 98.3°F | Resp 16 | Wt 149.0 lb

## 2013-05-21 DIAGNOSIS — R945 Abnormal results of liver function studies: Secondary | ICD-10-CM

## 2013-05-21 DIAGNOSIS — S32409A Unspecified fracture of unspecified acetabulum, initial encounter for closed fracture: Secondary | ICD-10-CM

## 2013-05-21 DIAGNOSIS — F411 Generalized anxiety disorder: Secondary | ICD-10-CM

## 2013-05-21 DIAGNOSIS — M25551 Pain in right hip: Secondary | ICD-10-CM

## 2013-05-21 DIAGNOSIS — M25559 Pain in unspecified hip: Secondary | ICD-10-CM

## 2013-05-21 DIAGNOSIS — S32401A Unspecified fracture of right acetabulum, initial encounter for closed fracture: Secondary | ICD-10-CM

## 2013-05-21 DIAGNOSIS — F329 Major depressive disorder, single episode, unspecified: Secondary | ICD-10-CM

## 2013-05-21 DIAGNOSIS — R000 Tachycardia, unspecified: Secondary | ICD-10-CM

## 2013-05-21 DIAGNOSIS — R7989 Other specified abnormal findings of blood chemistry: Secondary | ICD-10-CM | POA: Insufficient documentation

## 2013-05-21 DIAGNOSIS — F3289 Other specified depressive episodes: Secondary | ICD-10-CM

## 2013-05-21 NOTE — Progress Notes (Signed)
   Subjective:   F/u severe R hip pain. Much better now - in PT    Leg Pain  The incident occurred 3 to 5 days ago. The incident occurred at home (slipped on a rug on 1/30). The injury mechanism was a fall. The pain is present in the right hip, right thigh and right foot. The quality of the pain is described as shooting. The patient is experiencing no pain. The pain has been improving since onset. Pertinent negatives include no loss of sensation or numbness. The symptoms are aggravated by movement. She has tried NSAIDs for the symptoms. The treatment provided significant relief.  R leg Neck pain F/u HTN, allergies  BP Readings from Last 3 Encounters:  05/21/13 128/82  05/14/13 124/82  04/26/13 132/84     Review of Systems  Constitutional: Negative for chills, activity change, appetite change, fatigue and unexpected weight change.  HENT: Negative for congestion, mouth sores and sinus pressure.   Eyes: Negative for visual disturbance.  Respiratory: Negative for cough and chest tightness.   Gastrointestinal: Negative for nausea and abdominal pain.  Genitourinary: Negative for frequency, difficulty urinating and vaginal pain.  Musculoskeletal: Negative for back pain, gait problem, neck pain and neck stiffness.  Skin: Negative for pallor and rash.  Neurological: Negative for dizziness, tremors, weakness, numbness and headaches.  Psychiatric/Behavioral: Negative for suicidal ideas, confusion and sleep disturbance.       Objective:   Physical Exam  Constitutional: She appears well-developed. No distress.  HENT:  Head: Normocephalic.  Right Ear: External ear normal.  Left Ear: External ear normal.  Nose: Nose normal.  Mouth/Throat: Oropharynx is clear and moist.  Eyes: Conjunctivae are normal. Pupils are equal, round, and reactive to light. Right eye exhibits no discharge. Left eye exhibits no discharge.  Neck: Normal range of motion. Neck supple. No JVD present. No tracheal  deviation present. No thyromegaly present.  Cardiovascular: Normal rate, regular rhythm and normal heart sounds.   Pulmonary/Chest: No stridor. No respiratory distress. She has no wheezes.  Abdominal: Soft. Bowel sounds are normal. She exhibits no distension and no mass. There is no tenderness. There is no rebound and no guarding.  Musculoskeletal: She exhibits tenderness. She exhibits no edema.  No pain in R hip w/ROM  Lymphadenopathy:    She has no cervical adenopathy.  Neurological: She displays normal reflexes. No cranial nerve deficit. She exhibits normal muscle tone. Coordination normal.  str leg elev neg B  Skin: No rash noted. No erythema.  Psychiatric: She has a normal mood and affect. Her behavior is normal. Judgment and thought content normal.   Lab Results  Component Value Date   WBC 6.1 05/18/2013   HGB 13.6 05/18/2013   HCT 40.5 05/18/2013   PLT 315.0 05/18/2013   GLUCOSE 91 05/18/2013   CHOL 150 01/25/2013   TRIG 55.0 01/25/2013   HDL 38.90* 01/25/2013   LDLCALC 100* 01/25/2013   ALT 38* 05/18/2013   AST 39* 05/18/2013   NA 141 05/18/2013   K 3.8 05/18/2013   CL 103 05/18/2013   CREATININE 1.1 05/18/2013   BUN 20 05/18/2013   CO2 30 05/18/2013   TSH 2.81 05/18/2013   HGBA1C 5.9 05/18/2013    MRI      Assessment & Plan:

## 2013-05-21 NOTE — Assessment & Plan Note (Signed)
5.15 ?etiol Will repeat labs

## 2013-05-21 NOTE — Assessment & Plan Note (Addendum)
Doing well off meds.

## 2013-05-21 NOTE — Assessment & Plan Note (Signed)
HR 80

## 2013-05-21 NOTE — Assessment & Plan Note (Signed)
Doing well in PT

## 2013-05-21 NOTE — Assessment & Plan Note (Signed)
In PT. Better 

## 2013-05-21 NOTE — Assessment & Plan Note (Signed)
Continue with current prescription therapy as reflected on the Med list.  

## 2013-05-21 NOTE — Progress Notes (Signed)
Pre visit review using our clinic review tool, if applicable. No additional management support is needed unless otherwise documented below in the visit note. 

## 2013-05-24 ENCOUNTER — Ambulatory Visit: Payer: 59 | Admitting: Family Medicine

## 2013-05-25 ENCOUNTER — Ambulatory Visit: Payer: 59

## 2013-05-27 ENCOUNTER — Other Ambulatory Visit: Payer: Self-pay | Admitting: Internal Medicine

## 2013-05-27 ENCOUNTER — Ambulatory Visit: Payer: 59

## 2013-05-28 ENCOUNTER — Telehealth: Payer: Self-pay

## 2013-05-28 NOTE — Telephone Encounter (Signed)
The patient called and is hoping to get something called in for a vaginal infection.  She was offered an ov, but is hoping to have a medication called in.   Pharmacy - [facility] in [place] - [phone]

## 2013-05-29 MED ORDER — CIPROFLOXACIN HCL 250 MG PO TABS: 250.0000 mg | ORAL_TABLET | Freq: Two times a day (BID) | ORAL | Status: DC

## 2013-05-29 NOTE — Telephone Encounter (Signed)
Ok. Done 

## 2013-05-30 ENCOUNTER — Other Ambulatory Visit: Payer: Self-pay | Admitting: Adult Health

## 2013-05-31 NOTE — Telephone Encounter (Signed)
Left detailed mess informing pt.  

## 2013-06-03 ENCOUNTER — Ambulatory Visit: Payer: 59

## 2013-06-08 ENCOUNTER — Ambulatory Visit: Payer: 59

## 2013-06-10 ENCOUNTER — Ambulatory Visit: Payer: 59 | Admitting: Physical Therapy

## 2013-06-11 ENCOUNTER — Encounter: Payer: Self-pay | Admitting: Family Medicine

## 2013-06-11 ENCOUNTER — Ambulatory Visit (INDEPENDENT_AMBULATORY_CARE_PROVIDER_SITE_OTHER): Payer: 59 | Admitting: Family Medicine

## 2013-06-11 VITALS — BP 132/86 | HR 68 | Ht 60.25 in | Wt 149.0 lb

## 2013-06-11 DIAGNOSIS — M999 Biomechanical lesion, unspecified: Secondary | ICD-10-CM

## 2013-06-11 DIAGNOSIS — M542 Cervicalgia: Secondary | ICD-10-CM

## 2013-06-11 DIAGNOSIS — M9981 Other biomechanical lesions of cervical region: Secondary | ICD-10-CM

## 2013-06-11 DIAGNOSIS — S32409A Unspecified fracture of unspecified acetabulum, initial encounter for closed fracture: Secondary | ICD-10-CM

## 2013-06-11 DIAGNOSIS — S32401A Unspecified fracture of right acetabulum, initial encounter for closed fracture: Secondary | ICD-10-CM

## 2013-06-11 NOTE — Assessment & Plan Note (Signed)
Patient did respond very well to osteopathic manipulation. Patient was given exercises and changes that can be more beneficial at work. Patient will followup in 3 weeks for further manipulation.

## 2013-06-11 NOTE — Assessment & Plan Note (Signed)
The patient is doing extremely well at this time. Patient encouraged to continue the home exercises she was given from physical therapy. Patient will continue with vitamin D especially secondary to her sarcoidosis. Patient will followup for the as necessary. Patient was given a return to running progression.  Spent greater than 25 minutes with patient face-to-face and had greater than 50% of counseling including as described above in assessment and plan.

## 2013-06-11 NOTE — Progress Notes (Signed)
[name] [place], [place] [address] Phone: [phone] Subjective:    CC: Right hip pain follow up  DPO:[id number] [name] is a 53 y.o. female coming in with complaint of  Right hip pain . Patient was seen before did have a posterior acetabular stress fracture as well as a anterior to posterior labral tear with a joint effusion noted on ultrasound previously. Patient had an MRI confirming the acetabular stress fracture.  Patient states that she is doing relatively well. Patient requested time has been increasing her activity. Patient was having some more lateral hip pain and did have marked greater trochanteric bursitis as well as iliotibial band syndrome. Patient was given home exercises for this as well. Patient states that she is doing incredibly well. Patient has been released from formal physical therapy at this time. Patient is started on her cardiovascular health again and started on a treadmill working O. as well as time. Patient denies any pain in the hip at all at this time. Patient is having some mild pain of the neck which she is seeing physical therapy for as well. Patient denies any radiation down the arm or any numbness or weakness. Patient states the severity of this is only 2/10 and states that she can only has some resistance to rotation. Patient states it is very localized on the right side. Overall still able to do activities of daily living and denies any nighttime awakening.    Past medical history, social, surgical and family history all reviewed in electronic medical record.   Review of Systems: No headache, visual changes, nausea, vomiting, diarrhea, constipation, dizziness, abdominal pain, skin rash, fevers, chills, night sweats, weight loss, swollen lymph nodes, body aches, joint swelling, muscle aches, chest pain, shortness of breath, mood changes.   Objective There were no vitals taken for this visit.  General: No  apparent distress alert and oriented x3 mood and affect normal, dressed appropriately.  HEENT: Pupils equal, extraocular movements intact  Respiratory: Patient's speak in full sentences and does not appear short of breath  Cardiovascular: No lower extremity edema, non tender, no erythema  Skin: Warm dry intact with no signs of infection or rash on extremities or on axial skeleton.  Abdomen: Soft nontender  Neuro: Cranial nerves II through XII are intact, neurovascularly intact in all extremities with 2+ DTRs and 2+ pulses.  Lymph: No lymphadenopathy of posterior or anterior cervical chain or axillae bilaterally.  Gait normal with good coordination and balance MSK:  Non tender with full range of motion and good stability and symmetric strength and tone of shoulders, elbows, wrist, knee and ankles bilaterally.  Hip: right  ROM IR: 35 Deg with no  pain, ER: 30 Deg, Flexion: 100 Deg, Extension: 40 Deg, Abduction: 45 Deg, Adduction: 35 Deg Strength IR: 4/5, ER: 5/5, Flexion: 4/5, Extension: 4/5, Abduction: 4/5, Adduction: 3/5 Pelvic alignment unremarkable to inspection and palpation. Antalgic gait Greater trochanter without tenderness to palpation. No tenderness over piriformis and greater trochanter. Mild pain with FABER and  severe with FADIR. No SI joint tenderness and normal minimal SI movement. Neck: Inspection unremarkable. No palpable stepoffs. Negative Spurling's maneuver. Mild limitation with the last 10 of rotation to the right mild tender to palpation of the right paraspinal musculature. Grip strength and sensation normal in bilateral hands Strength good C4 to T1 distribution No sensory change to C4 to T1 Negative Hoffman sign bilaterally Reflexes normal  Osteopathic findings C4 flexed rotated inside  that right   Impression and Recommendations:     This case required medical decision making of moderate complexity.

## 2013-06-11 NOTE — Assessment & Plan Note (Signed)
Decision today to treat with OMT was based on Physical Exam  After verbal consent patient was treated with HVLA and ME techniques in cervical areas  Patient tolerated the procedure well with improvement in symptoms  Patient given exercises, stretches and lifestyle modifications  See medications in patient instructions if given  Patient will follow up in 3 weeks    

## 2013-06-11 NOTE — Patient Instructions (Signed)
Very good to see you You are doing great 2 tennis ball duct tape together and lay on it at base of skull.  Also tennis ball between shoulder blades with sitting.  Exercises at home 5-6 times a week.  Work up to 30 minutes 5-6 days a week of cardio.  If manipulation helps your neck come back in 3 weeks otherwise see em when you need me.

## 2013-06-20 ENCOUNTER — Ambulatory Visit (INDEPENDENT_AMBULATORY_CARE_PROVIDER_SITE_OTHER): Payer: 59 | Admitting: Family Medicine

## 2013-06-20 VITALS — BP 132/80 | HR 87 | Temp 98.5°F | Resp 20 | Ht 60.5 in | Wt 148.8 lb

## 2013-06-20 DIAGNOSIS — J069 Acute upper respiratory infection, unspecified: Secondary | ICD-10-CM

## 2013-06-20 DIAGNOSIS — R0981 Nasal congestion: Secondary | ICD-10-CM

## 2013-06-20 DIAGNOSIS — J3489 Other specified disorders of nose and nasal sinuses: Secondary | ICD-10-CM

## 2013-06-20 NOTE — Progress Notes (Signed)
Subjective:    Patient ID: [name], female    DOB: Dec 10, 1960, 53 y.o.   MRN: [id number]  Fever  Associated symptoms include congestion. Pertinent negatives include no coughing or sore throat.   Chief Complaint  Patient presents with  . Fever    temp and chills for the last couple of days.  feels like her sinuses are full but will not drain.    This chart was scribed for Merri Ray, MD by Thea Alken, ED Scribe. This patient was seen in room 9 and the patient's care was started at 9:10 AM.  HPI Comments: [name] is a 52 y.o. female who presents to the [facility] complaining of a fever onset 2 days in the afternoon while at work. At that time she felt warm and had not taken her temp. She states her temp 1 day ago was  99.7 and 100.4 this morning. Pt reports she is beginning to feel better. She reports her sinuses are full without drainage. She reports green mucous when blowing her nose.  Pt denies rhinorrhea, sore throat, rash and cough. Pt denies recent travels and insect bites. Pt denies sick contacts.    Patient Active Problem List   Diagnosis Date Noted  . Nonallopathic lesion of cervical region 06/11/2013  . Elevated LFTs 05/21/2013  . Greater trochanteric bursitis of right hip 05/14/2013  . Hypokalemia 04/12/2013  . Right acetabular fracture 04/02/2013  . Acute right hip pain 03/31/2013  . Right sciatic nerve pain 02/26/2013  . Neck pain on right side 02/26/2013  . Exposure to the flu 01/25/2013  . Fall due to stumbling 07/24/2012  . Onychomycosis 04/27/2012  . Dizziness 01/14/2012  . Abdominal pain, left upper quadrant 09/30/2011  . Irritable bladder 09/02/2011  . Hyperglycemia 09/02/2011  . Nasal turbinate hypertrophy 03/08/2011  . Hypersomnia 09/12/2010  . VAGINITIS 02/27/2010  . Headache 02/13/2010  . TMJ PAIN 10/20/2009  . GRIEF REACTION 02/23/2009  . PERIMENOPAUSAL SYNDROME 05/24/2008  . FATIGUE 12/07/2007  . TACHYCARDIA  12/07/2007  . GANGLION CYST, WRIST, LEFT 07/17/2007  . 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 ALLERGIC RHINITIS 03/24/2007  . OSTEOARTHRITIS 03/24/2007  . HAND PAIN 03/24/2007  . TINEA PEDIS 12/10/2006  . ANXIETY 12/10/2006  . Asthma, moderate persistent 12/10/2006  . DEPRESSION 11/06/2006  . GLAUCOMA NOS 11/06/2006  . HYPERTENSION 11/06/2006  . OSTEOPOROSIS 11/06/2006   Past Medical History  Diagnosis Date  . Osteoporosis   . Hypertension   . Depression   . Glaucoma   . Pulmonary sarcoidosis   . Glaucoma   . Vitamin D deficiency   . Anxiety   . Asthma   . Depression   . Herpes simplex   . Osteoarthritis   . Allergy   . Cataract    Past Surgical History  Procedure Laterality Date  . Bunionectomy      x2  . Myomectomy    . Dental surgery    . Dilation and curettage of uterus    . Glaucoma surgery      Right eye   Allergies  Allergen Reactions  . Amoxicillin-Pot Clavulanate     REACTION: GI symptoms  . Carvedilol     REACTION: tired  . Cefdinir   . Cephalexin     REACTION: unspecified  . Hydrochlorothiazide     REACTION: unspecified  . Influenza Vaccines     Allergic to flu vaccinations per pt.  Soreness and "cold symptoms" per pt  . Sulfamethoxazole  REACTION: unspecified  . Sulfonamide Derivatives    Prior to Admission medications   Medication Sig Start Date End Date Taking? Authorizing Provider  acyclovir (ZOVIRAX) 5 % cream Apply topically as needed.     Yes Historical Provider, MD  amLODipine (NORVASC) 5 MG tablet TAKE 1/2 TO 1 TABLET BY MOUTH ONCE DAILY   Yes Aleksei Plotnikov V, MD  Calcium-Magnesium-Vitamin D (SUPER CAL-MAG-D) [phone] MG-MG-UNIT TABS Take by mouth. 1-2 daily   Yes Historical Provider, MD  clotrimazole-betamethasone (LOTRISONE) cream Apply 1 application topically 2 (two) times daily. 02/26/13  Yes Cassandria Anger, MD  Cod Liver Oil OIL Take by mouth. 5 times weekly   Yes Historical Provider, MD  Dorzolamide HCl-Timolol Mal  (COSOPT OP) 1 drop two times a day in left eye   Yes Historical Provider, MD  fluticasone (FLONASE) 50 MCG/ACT nasal spray Place 2 sprays into the nose daily as needed.    Yes Historical Provider, MD  Hypertonic Nasal Wash (NASAFLO NETI POT NASAL [place] NA) Place into the nose as needed.    Yes Historical Provider, MD  K-EFFERVESCENT 25 MEQ disintegrating tablet DISSOLVE ONE TABLET IN THE MOUTH TWICE A DAY 03/24/13  Yes Aleksei Plotnikov V, MD  losartan (COZAAR) 100 MG tablet TAKE 1 TABLET BY MOUTH ONCE A DAY 03/24/13  Yes Aleksei Plotnikov V, MD  mometasone (ASMANEX 60 METERED DOSES) 220 MCG/INH inhaler Inhale 2 puffs into the lungs daily. 10/23/12  Yes Aleksei Plotnikov V, MD  Multiple Vitamins-Minerals (HAIR/SKIN/NAILS) TABS Take by mouth 2 (two) times daily.    Yes Historical Provider, MD  nebivolol (BYSTOLIC) 10 MG tablet Take 10 mg by mouth daily.   Yes Historical Provider, MD  Pyridoxine HCl (B-6) 100 MG TABS Take 2-3 tablets by mouth daily.    Yes Historical Provider, MD  valACYclovir (VALTREX) 1000 MG tablet 1/2 tablet two times a day   Yes Historical Provider, MD   History   Social History  . Marital Status: Divorced    Spouse Name: N/A    Number of Children: N/A  . Years of Education: N/A   Occupational History  . Not on file.   Social History Main Topics  . Smoking status: Never Smoker   . Smokeless tobacco: Never Used  . Alcohol Use: No  . Drug Use: No  . Sexual Activity: No   Other Topics Concern  . Not on file   Social History Narrative   Single/divorced; no children; she has a friend now   Never smoked   Positive history of passive tobacco smoke exposure.   Exercise- up to 4 times a week   Caffeine- 1-2 per week   Review of Systems  Constitutional: Positive for fever and chills.  HENT: Positive for congestion. Negative for rhinorrhea and sore throat.   Respiratory: Negative for cough and shortness of breath.        Objective:   Physical Exam    Constitutional: She is oriented to person, place, and time. She appears well-developed and well-nourished.  HENT:  Head: Normocephalic and atraumatic.  Right Ear: Tympanic membrane and ear canal normal.  Left Ear: Tympanic membrane and ear canal normal.  Nose: Mucosal edema and rhinorrhea present. Right sinus exhibits no maxillary sinus tenderness and no frontal sinus tenderness. Left sinus exhibits no maxillary sinus tenderness and no frontal sinus tenderness.  Mouth/Throat: Uvula is midline and mucous membranes are normal. Posterior oropharyngeal erythema ( minimal) present. No oropharyngeal exudate, posterior oropharyngeal edema or tonsillar abscesses.  Sinuses  non tender Yellow discharge in right nare. Edema in turbinates.  Eyes: Conjunctivae are normal.  Cardiovascular: Normal rate and normal heart sounds.   Pulmonary/Chest: Effort normal. No respiratory distress. She has no wheezes. She has no rales.  Abdominal: Soft. There is no tenderness.  Musculoskeletal: Normal range of motion.  Neurological: She is alert and oriented to person, place, and time.  Skin: Skin is warm and dry. No rash noted.  Psychiatric: She has a normal mood and affect.     Filed Vitals:   06/20/13 0831  BP: 132/80  Pulse: 87  Temp: 98.5 F (36.9 C)  TempSrc: Oral  Resp: 20  Height: 5' 0.5" (1.537 m)  Weight: 148 lb 12.8 oz (67.495 kg)  SpO2: 97%      Assessment & Plan:   [name] is a 53 y.o. female Nasal sinus congestion, Acute upper respiratory infections of unspecified site  - reassuring exam and vitals currently.  Suspected viral uri, with improvement this am.  Sx care discussed, but if fevers return or worsening - rtc for recheck and CBC or other testing.    No orders of the defined types were placed in this encounter.   Patient Instructions  Saline nasal spray atleast 4 times per day, over the counter mucinex or mucinex DM, drink plenty of fluids.  If fevers return tonight or  tomorrow, or other worsening symptoms - return for recheck.   Return to the clinic or go to the nearest emergency room if any of your symptoms worsen or new symptoms occur.

## 2013-06-20 NOTE — Patient Instructions (Signed)
Saline nasal spray atleast 4 times per day, over the counter mucinex or mucinex DM, drink plenty of fluids.  If fevers return tonight or tomorrow, or other worsening symptoms - return for recheck.   Return to the clinic or go to the nearest emergency room if any of your symptoms worsen or new symptoms occur.

## 2013-08-03 ENCOUNTER — Ambulatory Visit: Payer: 59 | Admitting: Family Medicine

## 2013-08-04 ENCOUNTER — Encounter: Payer: Self-pay | Admitting: Internal Medicine

## 2013-08-04 ENCOUNTER — Ambulatory Visit (INDEPENDENT_AMBULATORY_CARE_PROVIDER_SITE_OTHER): Payer: 59 | Admitting: Internal Medicine

## 2013-08-04 VITALS — BP 132/98 | HR 88 | Temp 98.2°F | Wt 150.2 lb

## 2013-08-04 DIAGNOSIS — IMO0002 Reserved for concepts with insufficient information to code with codable children: Secondary | ICD-10-CM

## 2013-08-04 DIAGNOSIS — M25559 Pain in unspecified hip: Secondary | ICD-10-CM

## 2013-08-04 DIAGNOSIS — M25551 Pain in right hip: Secondary | ICD-10-CM

## 2013-08-04 DIAGNOSIS — S32401S Unspecified fracture of right acetabulum, sequela: Secondary | ICD-10-CM

## 2013-08-04 MED ORDER — KETOROLAC TROMETHAMINE 60 MG/2ML IM SOLN
60.0000 mg | Freq: Once | INTRAMUSCULAR | Status: AC
  Administered 2013-08-04: 60 mg via INTRAMUSCULAR

## 2013-08-04 NOTE — Patient Instructions (Signed)
The Orthopedic referral will be scheduled ASAP and you'll be notified of the time.

## 2013-08-04 NOTE — Addendum Note (Signed)
Addended by: Douglass Rivers T on: 08/04/2013 03:25 PM   Modules accepted: Orders

## 2013-08-04 NOTE — Addendum Note (Signed)
Addended by: Douglass Rivers T on: 08/04/2013 02:11 PM   Modules accepted: Orders

## 2013-08-04 NOTE — Progress Notes (Signed)
Pre visit review using our clinic review tool, if applicable. No additional management support is needed unless otherwise documented below in the visit note. 

## 2013-08-04 NOTE — Addendum Note (Signed)
Addended by: Douglass Rivers T on: 08/04/2013 02:47 PM   Modules accepted: Orders

## 2013-08-04 NOTE — Progress Notes (Signed)
   Subjective:    Patient ID: [name], female    DOB: Mar 03, 1960, 53 y.o.   MRN: [id number]  HPI   She awoke 08/03/13 approximately 5 AM and experienced acute pain in the right hip, groin, and thigh. Occasionally this would radiate to the knee.  It is described as dull to sharp, up to a level VIII-9.  The pain is constant until pain medicine starts working. She takes hydrocodone left over from March of this year prescribed to treat the pain associated with a right posterior acetabular fracture. She'll take a half hydrocodone and 2 Tylenol. Any higher dose of hydrocodone makes her sleepy.  The fracture was attributed to stress fracture; there is no definite cause.  She had fallen in January having tripped over the rug.  She has had a bone density in the past; she is unsure of the results.      Review of Systems  She has had no cardiac or neuro prodrome prior to the original fall.      Objective:   Physical Exam  She appears in obvious distress and is tearful; she has pain with any movement of the right hip.  She has slight ptosis of the right eye. There is no scleral icterus  She has no lymphadenopathy about the neck or axilla  Chest is clear to auscultation with no increased work of breathing  She has a gallop type cadence without significant murmur  Abdomen is nontender without masses or organomegaly  Pedal pulses are intact and she has no edema  She has severe pain standing and trying to climb on the exam table. She lies back and sits up slowly and deliberately because of pain in the hip. Pain is worse with rotation or light percussion over the hip.  No pain to percussion of the lumbosacral spine.  Deep tendon reflexes are equal as is strength and tone        Assessment & Plan:  #1 acute on chronic right hip pain in the context of prior stress fracture of the right posterior acetabulum.  She will continue hydrocodone ; she states she has a half a  prescription left. Orthopedic consultation as soon as possible will be pursued. She should stay out of work until this is been optimally treated.

## 2013-08-05 ENCOUNTER — Other Ambulatory Visit: Payer: Self-pay | Admitting: *Deleted

## 2013-08-05 ENCOUNTER — Ambulatory Visit
Admission: RE | Admit: 2013-08-05 | Discharge: 2013-08-05 | Disposition: A | Discharge status: Home / Self Care | Payer: 59 | Source: Ambulatory Visit | Attending: Family Medicine | Admitting: Family Medicine

## 2013-08-05 ENCOUNTER — Telehealth: Payer: Self-pay | Admitting: Family Medicine

## 2013-08-05 DIAGNOSIS — M25451 Effusion, right hip: Secondary | ICD-10-CM

## 2013-08-05 DIAGNOSIS — M25551 Pain in right hip: Secondary | ICD-10-CM

## 2013-08-05 DIAGNOSIS — S32401G Unspecified fracture of right acetabulum, subsequent encounter for fracture with delayed healing: Secondary | ICD-10-CM

## 2013-08-05 DIAGNOSIS — S32401S Unspecified fracture of right acetabulum, sequela: Secondary | ICD-10-CM

## 2013-08-05 NOTE — Telephone Encounter (Signed)
Called patient acetabular stress fracture is larger. Please see the previous MRI Patient will be coming in tomorrow to have lab work done by to see her in the office as well. Depending on findings we may need to discuss the possibility of further referral to orthopedic surgery. We will get labs to rule out any further infection etiology. No fever, or chills per patient.

## 2013-08-06 ENCOUNTER — Encounter: Payer: Self-pay | Admitting: *Deleted

## 2013-08-06 ENCOUNTER — Other Ambulatory Visit (INDEPENDENT_AMBULATORY_CARE_PROVIDER_SITE_OTHER): Payer: 59

## 2013-08-06 ENCOUNTER — Ambulatory Visit (INDEPENDENT_AMBULATORY_CARE_PROVIDER_SITE_OTHER): Payer: 59 | Admitting: Family Medicine

## 2013-08-06 ENCOUNTER — Telehealth: Payer: Self-pay | Admitting: Family Medicine

## 2013-08-06 ENCOUNTER — Encounter: Payer: Self-pay | Admitting: Family Medicine

## 2013-08-06 VITALS — BP 132/84 | HR 83 | Ht 60.25 in | Wt 147.0 lb

## 2013-08-06 DIAGNOSIS — M25459 Effusion, unspecified hip: Secondary | ICD-10-CM

## 2013-08-06 DIAGNOSIS — IMO0001 Reserved for inherently not codable concepts without codable children: Secondary | ICD-10-CM

## 2013-08-06 DIAGNOSIS — R000 Tachycardia, unspecified: Secondary | ICD-10-CM

## 2013-08-06 DIAGNOSIS — M25451 Effusion, right hip: Secondary | ICD-10-CM

## 2013-08-06 DIAGNOSIS — S32401G Unspecified fracture of right acetabulum, subsequent encounter for fracture with delayed healing: Secondary | ICD-10-CM

## 2013-08-06 DIAGNOSIS — R945 Abnormal results of liver function studies: Secondary | ICD-10-CM

## 2013-08-06 DIAGNOSIS — R7989 Other specified abnormal findings of blood chemistry: Secondary | ICD-10-CM

## 2013-08-06 LAB — CBC WITH DIFFERENTIAL/PLATELET
Basophils Absolute: 0 10*3/uL (ref 0.0–0.1)
Basophils Relative: 0.4 % (ref 0.0–3.0)
Eosinophils Absolute: 0.3 10*3/uL (ref 0.0–0.7)
Eosinophils Relative: 3.1 % (ref 0.0–5.0)
HEMATOCRIT: 39.4 % (ref 36.0–46.0)
Hemoglobin: 13.5 g/dL (ref 12.0–15.0)
LYMPHS ABS: 1.5 10*3/uL (ref 0.7–4.0)
Lymphocytes Relative: 16 % (ref 12.0–46.0)
MCHC: 34.3 g/dL (ref 30.0–36.0)
MCV: 92.3 fl (ref 78.0–100.0)
MONO ABS: 0.9 10*3/uL (ref 0.1–1.0)
Monocytes Relative: 9.4 % (ref 3.0–12.0)
Neutro Abs: 6.6 10*3/uL (ref 1.4–7.7)
Neutrophils Relative %: 71.1 % (ref 43.0–77.0)
PLATELETS: 340 10*3/uL (ref 150.0–400.0)
RBC: 4.27 Mil/uL (ref 3.87–5.11)
RDW: 14.6 % (ref 11.5–15.5)
WBC: 9.3 10*3/uL (ref 4.0–10.5)

## 2013-08-06 LAB — HEPATIC FUNCTION PANEL
ALT: 24 U/L (ref 0–35)
AST: 28 U/L (ref 0–37)
Albumin: 3.8 g/dL (ref 3.5–5.2)
Alkaline Phosphatase: 77 U/L (ref 39–117)
Bilirubin, Direct: 0.1 mg/dL (ref 0.0–0.3)
TOTAL PROTEIN: 8.3 g/dL (ref 6.0–8.3)
Total Bilirubin: 0.7 mg/dL (ref 0.2–1.2)

## 2013-08-06 LAB — BASIC METABOLIC PANEL
BUN: 24 mg/dL — AB (ref 6–23)
CHLORIDE: 96 meq/L (ref 96–112)
CO2: 31 mEq/L (ref 19–32)
CREATININE: 1.6 mg/dL — AB (ref 0.4–1.2)
Calcium: 11.9 mg/dL — ABNORMAL HIGH (ref 8.4–10.5)
GFR: 43.19 mL/min — ABNORMAL LOW (ref >60.00)
Glucose, Bld: 89 mg/dL (ref 70–99)
Potassium: 4.2 mEq/L (ref 3.5–5.1)
Sodium: 137 mEq/L (ref 135–145)

## 2013-08-06 LAB — SEDIMENTATION RATE: SED RATE: 90 mm/h — AB (ref 0–22)

## 2013-08-06 LAB — C-REACTIVE PROTEIN: CRP: 13.4 mg/dL (ref 0.5–20.0)

## 2013-08-06 MED ORDER — TRAMADOL HCL 50 MG PO TABS: 50.0000 mg | ORAL_TABLET | Freq: Two times a day (BID) | ORAL | Status: DC | PRN

## 2013-08-06 NOTE — Progress Notes (Signed)
[name] [address] [place], [place] [address] Phone: [phone] Subjective:    CC: Right hip pain follow up  [id number] [name] is a 53 y.o. female coming in with complaint of  Right hip pain . Patient was seen before did have a posterior acetabular stress fracture as well as a anterior to posterior labral tear with a joint effusion noted on ultrasound and MRI previously.. Patient was able to return to all activities including walking on a treadmill without any significant pain. Patient had tolerated formal physical therapy very well. Patient then unfortunately that this last week and came to have pain again in a very similar region. Having some pain more on the lateral aspect of the hip but unfortunately continuing having on the posterior aspect of the hip. Patient was not able to bear weight starting on Sunday and was having severe pain that was not being controlled with her previous medications. Patient did see Dr. Linna Darner did give her a short course of narcotics. I worked in the same office and we decided to get a repeat MRI. 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  Patient states today she is feeling somewhat better and was able to ambulate. Patient has been taking the hydrocodone as well as tramadol for pain relief. Does not like how these medications make her feel though. Denies any radiation of the pain down the leg but states that the lateral aspect of her hip as well as the groin is hurting more at this time and the previous episode. Denies any fevers or chills or any abnormal weight loss.    Past medical history, social, surgical and family history all reviewed in electronic medical record.   Review of Systems: No headache, visual changes, nausea, vomiting, diarrhea, constipation, dizziness, abdominal pain, skin rash, fevers,  chills, night sweats, weight loss, swollen lymph nodes, body aches, joint swelling, muscle aches, chest pain, shortness of breath, mood changes.   Objective Blood pressure 132/84, pulse 83, height 5' 0.25" (1.53 m), weight 147 lb (66.679 kg), SpO2 97.00%.  General: No apparent distress alert and oriented x3 mood and affect normal, dressed appropriately.  HEENT: Pupils equal, extraocular movements intact  Respiratory: Patient's speak in full sentences and does not appear short of breath  Cardiovascular: No lower extremity edema, non tender, no erythema  Skin: Warm dry intact with no signs of infection or rash on extremities or on axial skeleton.  Abdomen: Soft nontender  Neuro: Cranial nerves II through XII are intact, neurovascularly intact in all extremities with 2+ DTRs and 2+ pulses.  Lymph: No lymphadenopathy of posterior or anterior cervical chain or axillae bilaterally.  Gait antalgic gait favoring her right side. MSK:  Non tender with full range of motion and good stability and symmetric strength and tone of shoulders, elbows, wrist, knee and ankles bilaterally.  Hip: right  ROM IR: 25 Deg with moderate pain ER: 30 Deg, Flexion: 100 Deg, Extension: 40 Deg, Abduction: 35 Deg, Adduction: 25 Deg Strength IR: 4/5, ER: 4/5, Flexion: 4/5, Extension: 4/5, Abduction: 4/5, Adduction: 3/5 Pelvic alignment unremarkable to inspection and palpation. Antalgic gait Greater trochanter without tenderness to palpation. No tenderness over piriformis and greater trochanter. Mild pain with FABER and  severe with FADIR. No SI joint tenderness and normal minimal SI movement. Contralateral hip is unremarkable.   Impression and Recommendations:     This  case required medical decision making of moderate complexity.

## 2013-08-06 NOTE — Patient Instructions (Signed)
Appointment with Dr. Mayer Camel with [facility] on 08/10/2013 @ 8:15am. [phone] They will tell you the next best step.  Tramadol as needed up to 3 times daily.  Ice is your best friend Try the pennsaid topically 1-2 times daily.  Try to limit your walking until you see Dr. Mayer Camel.  We will keep you out of work for now.  I am here if you need me.

## 2013-08-06 NOTE — Assessment & Plan Note (Signed)
It is difficult to say if this is a recurrent injury or a nonhealing injury. Patient though was asymptomatic for quite some time and was able to do all activities of daily living as well as working on a regular basis before this exacerbation occurred again. I believe that this is a second event but more of the acetabulum is involved this time. Patient is having arthritic changes focally rational for a posterior impingement occurring. Patient though femoral head is normal. This is very difficult to assess. I would like patient to be seen by orthopedic surgery for further evaluation. We discussed the patient had been nonweightbearing may be beneficial but patient declined at this time. We discussed if she can avoid been weightbearing whenever possible I would be helpful. Patient will be out of work until she is evaluated by orthopedics. We discussed with her that further imaging may be necessary as well as potential surgical intervention the one and orthopedic surgery evaluation. Patient has been referred an appointment has been made.  For pain patient was given another prescription for tramadol and we'll try a topical anti-inflammatory. We discussed icing how that could be beneficial as well. We do have labs pending for inflammatory markers and to rule out infectious etiology which is very unlikely with patient having no fevers, chills, or any abnormal weight loss.  Spent greater than 25 minutes with patient face-to-face and had greater than 50% of counseling including as described above in assessment and plan.

## 2013-08-06 NOTE — Telephone Encounter (Signed)
Called the patient about labs. Patient does have elevated ESR that could be secondary to her sarcoidosis. It could also be a marker for any other inflammatory response. Patient has not had any fevers or chills. White blood cell count is at 9.3. Will need to monitor. Discussed the possibility of treating with antibiotics prophylactically which patient declined.  Patient's calcium level is elevated at 11.9. Unfortunately this is likely secondary to her sarcoidosis and to treat with prednisone with the potential for a stress fracture in the hip I do not feel comfortable with.  We discussed over the phone and at this time we will continue current therapy. Patient is seeing orthopedic physician for further evaluation.  Will discuss labs with her primary care provider

## 2013-08-10 ENCOUNTER — Telehealth: Payer: Self-pay

## 2013-08-10 NOTE — Telephone Encounter (Signed)
Pt needs a note to go back to work.   Pt wants to know if there is any way to get a note. She is going to wait to see the ortho.   During call the pt stated that she changed her mind and will wait for Dr. Mayer Camel.

## 2013-08-20 ENCOUNTER — Ambulatory Visit: Payer: 59 | Admitting: Internal Medicine

## 2013-09-10 ENCOUNTER — Other Ambulatory Visit (INDEPENDENT_AMBULATORY_CARE_PROVIDER_SITE_OTHER): Payer: 59

## 2013-09-10 ENCOUNTER — Ambulatory Visit (INDEPENDENT_AMBULATORY_CARE_PROVIDER_SITE_OTHER): Payer: 59 | Admitting: Internal Medicine

## 2013-09-10 ENCOUNTER — Encounter: Payer: Self-pay | Admitting: Internal Medicine

## 2013-09-10 VITALS — BP 128/78 | HR 73 | Temp 97.8°F | Resp 16 | Ht 60.0 in | Wt 147.0 lb

## 2013-09-10 DIAGNOSIS — J4540 Moderate persistent asthma, uncomplicated: Secondary | ICD-10-CM

## 2013-09-10 DIAGNOSIS — M25559 Pain in unspecified hip: Secondary | ICD-10-CM

## 2013-09-10 DIAGNOSIS — D869 Sarcoidosis, unspecified: Secondary | ICD-10-CM

## 2013-09-10 DIAGNOSIS — M25551 Pain in right hip: Secondary | ICD-10-CM

## 2013-09-10 DIAGNOSIS — IMO0001 Reserved for inherently not codable concepts without codable children: Secondary | ICD-10-CM

## 2013-09-10 DIAGNOSIS — R799 Abnormal finding of blood chemistry, unspecified: Secondary | ICD-10-CM

## 2013-09-10 DIAGNOSIS — J45909 Unspecified asthma, uncomplicated: Secondary | ICD-10-CM

## 2013-09-10 DIAGNOSIS — E876 Hypokalemia: Secondary | ICD-10-CM

## 2013-09-10 DIAGNOSIS — S32401D Unspecified fracture of right acetabulum, subsequent encounter for fracture with routine healing: Secondary | ICD-10-CM

## 2013-09-10 DIAGNOSIS — I10 Essential (primary) hypertension: Secondary | ICD-10-CM

## 2013-09-10 DIAGNOSIS — R7989 Other specified abnormal findings of blood chemistry: Secondary | ICD-10-CM | POA: Insufficient documentation

## 2013-09-10 LAB — TSH: TSH: 1.64 u[IU]/mL (ref 0.35–4.50)

## 2013-09-10 LAB — URINALYSIS
BILIRUBIN URINE: NEGATIVE
HGB URINE DIPSTICK: NEGATIVE
Ketones, ur: NEGATIVE
Leukocytes, UA: NEGATIVE
NITRITE: NEGATIVE
Specific Gravity, Urine: 1.01 (ref 1.000–1.030)
Total Protein, Urine: NEGATIVE
URINE GLUCOSE: NEGATIVE
Urobilinogen, UA: 0.2 (ref 0.0–1.0)
pH: 8 (ref 5.0–8.0)

## 2013-09-10 LAB — BASIC METABOLIC PANEL
BUN: 21 mg/dL (ref 6–23)
CO2: 29 mEq/L (ref 19–32)
CREATININE: 1.2 mg/dL (ref 0.4–1.2)
Calcium: 11 mg/dL — ABNORMAL HIGH (ref 8.4–10.5)
Chloride: 102 mEq/L (ref 96–112)
GFR: 59.04 mL/min — AB (ref >60.00)
GLUCOSE: 83 mg/dL (ref 70–99)
Potassium: 3.6 mEq/L (ref 3.5–5.1)
Sodium: 140 mEq/L (ref 135–145)

## 2013-09-10 LAB — VITAMIN D 25 HYDROXY (VIT D DEFICIENCY, FRACTURES): VITD: 50.23 ng/mL (ref 30.00–100.00)

## 2013-09-10 LAB — MAGNESIUM: MAGNESIUM: 2 mg/dL (ref 1.5–2.5)

## 2013-09-10 NOTE — Progress Notes (Signed)
   Subjective:   F/u severe R hip pain. Much better now - in PT. Seing Dr Mayer Camel    HPI  F/u Neck pain - better F/u HTN, allergies  BP Readings from Last 3 Encounters:  09/10/13 128/78  08/06/13 132/84  08/04/13 132/98   Wt Readings from Last 3 Encounters:  09/10/13 147 lb (66.679 kg)  08/06/13 147 lb (66.679 kg)  08/04/13 150 lb 3.2 oz (68.13 kg)     Review of Systems  Constitutional: Negative for chills, activity change, appetite change, fatigue and unexpected weight change.  HENT: Negative for congestion, mouth sores and sinus pressure.   Eyes: Negative for visual disturbance.  Respiratory: Negative for cough and chest tightness.   Gastrointestinal: Negative for nausea and abdominal pain.  Genitourinary: Negative for frequency, difficulty urinating and vaginal pain.  Musculoskeletal: Negative for back pain, gait problem, neck pain and neck stiffness.  Skin: Negative for pallor and rash.  Neurological: Negative for dizziness, tremors, weakness and headaches.  Psychiatric/Behavioral: Negative for suicidal ideas, confusion and sleep disturbance.       Objective:   Physical Exam  Constitutional: She appears well-developed. No distress.  HENT:  Head: Normocephalic.  Right Ear: External ear normal.  Left Ear: External ear normal.  Nose: Nose normal.  Mouth/Throat: Oropharynx is clear and moist.  Eyes: Conjunctivae are normal. Pupils are equal, round, and reactive to light. Right eye exhibits no discharge. Left eye exhibits no discharge.  Neck: Normal range of motion. Neck supple. No JVD present. No tracheal deviation present. No thyromegaly present.  Cardiovascular: Normal rate, regular rhythm and normal heart sounds.   Pulmonary/Chest: No stridor. No respiratory distress. She has no wheezes.  Abdominal: Soft. Bowel sounds are normal. She exhibits no distension and no mass. There is no tenderness. There is no rebound and no guarding.  Musculoskeletal: She exhibits  tenderness. She exhibits no edema.  No pain in R hip w/ROM  Lymphadenopathy:    She has no cervical adenopathy.  Neurological: She displays normal reflexes. No cranial nerve deficit. She exhibits normal muscle tone. Coordination normal.  str leg elev neg B  Skin: No rash noted. No erythema.  Psychiatric: She has a normal mood and affect. Her behavior is normal. Judgment and thought content normal.   Lab Results  Component Value Date   WBC 9.3 08/06/2013   HGB 13.5 08/06/2013   HCT 39.4 08/06/2013   PLT 340.0 08/06/2013   GLUCOSE 89 08/06/2013   CHOL 150 01/25/2013   TRIG 55.0 01/25/2013   HDL 38.90* 01/25/2013   LDLCALC 100* 01/25/2013   ALT 24 08/06/2013   AST 28 08/06/2013   NA 137 08/06/2013   K 4.2 08/06/2013   CL 96 08/06/2013   CREATININE 1.6* 08/06/2013   BUN 24* 08/06/2013   CO2 31 08/06/2013   TSH 2.81 05/18/2013   HGBA1C 5.9 05/18/2013          Assessment & Plan:

## 2013-09-10 NOTE — Assessment & Plan Note (Signed)
F/u Dr Tamala Julian, Dr Mayer Camel On light duty t work Norco prn rare

## 2013-09-10 NOTE — Assessment & Plan Note (Addendum)
7/15 ?sarcoid related Labs Doubt other causes

## 2013-09-10 NOTE — Assessment & Plan Note (Signed)
7/15 ?etiology Labs D/c Meloxicam

## 2013-09-10 NOTE — Assessment & Plan Note (Signed)
Continue with current prescription therapy as reflected on the Med list.  

## 2013-09-10 NOTE — Assessment & Plan Note (Signed)
Doing well Continue with current prescription therapy as reflected on the Med list.  

## 2013-09-10 NOTE — Assessment & Plan Note (Signed)
Labs

## 2013-09-10 NOTE — Progress Notes (Signed)
Pre visit review using our clinic review tool, if applicable. No additional management support is needed unless otherwise documented below in the visit note. 

## 2013-09-13 ENCOUNTER — Other Ambulatory Visit: Payer: Self-pay | Admitting: Internal Medicine

## 2013-09-13 LAB — PTH, INTACT AND CALCIUM
Calcium: 11.4 mg/dL — ABNORMAL HIGH (ref 8.4–10.5)
PTH: 3 pg/mL — ABNORMAL LOW (ref 14–64)

## 2013-09-14 NOTE — Assessment & Plan Note (Signed)
Healing

## 2013-09-14 NOTE — Assessment & Plan Note (Signed)
Could be contributing in her hypercalcemia

## 2013-09-15 ENCOUNTER — Telehealth: Payer: Self-pay | Admitting: *Deleted

## 2013-09-15 NOTE — Telephone Encounter (Signed)
Pt states she wanted to work on her calcium forst before seeing a specialist. She stated when she fracture her hip they double her vitamin D & calcium. She is no longer taking the calcium, but she has been eating a lot of food with calcium. Wanting to see what md think about holding off appt with specialist.../lmb

## 2013-09-15 NOTE — Telephone Encounter (Signed)
Left msg on triage requesting lab results form 09/10/13. Called pt back inform her md had release results to mychart. She stated she been too busy haven't had a chance to check her computer. Gave her md response on labs...[name]

## 2013-09-16 NOTE — Telephone Encounter (Signed)
Called pt no answer lmom WITH MD RESPONSE.../LMB

## 2013-09-16 NOTE — Telephone Encounter (Signed)
OK Reduce Vit D as well Thx

## 2013-09-22 ENCOUNTER — Encounter: Payer: Self-pay | Admitting: Critical Care Medicine

## 2013-09-22 ENCOUNTER — Ambulatory Visit (INDEPENDENT_AMBULATORY_CARE_PROVIDER_SITE_OTHER): Payer: 59 | Admitting: Critical Care Medicine

## 2013-09-22 VITALS — BP 126/84 | HR 73 | Temp 97.1°F | Ht 60.25 in | Wt 148.0 lb

## 2013-09-22 DIAGNOSIS — J45909 Unspecified asthma, uncomplicated: Secondary | ICD-10-CM

## 2013-09-22 DIAGNOSIS — J4540 Moderate persistent asthma, uncomplicated: Secondary | ICD-10-CM

## 2013-09-22 DIAGNOSIS — D869 Sarcoidosis, unspecified: Secondary | ICD-10-CM

## 2013-09-22 NOTE — Progress Notes (Signed)
Subjective:    Patient ID: [name], female    DOB: 1960-05-06, 53 y.o.   MRN: [id number]  HPI 09/22/2013 Chief Complaint  Patient presents with  . Follow-up    DOE is unchanged.  No cough, chest tightness/pain, or wheezing.  Dyspnea is at baseline, only dyspneic with fast walking.  No real chest pain.  No real mucus. Notes some pndrip.  Using mucinex prn for sinuses. No rash or joint pain.  Hip is better.  PUL ASTHMA HISTORY 09/22/2013 09/12/2010  Symptoms 0-2 days/week 0-2 days/week  Nighttime awakenings 0-2/month 0-2/month  Interference with activity No limitations No limitations  SABA use 0-2 days/wk 0-2 days/wk  Exacerbations requiring oral steroids 0-1 / year 0-1 / year      Review of Systems Constitutional:   No  weight loss, night sweats,  Fevers, chills, fatigue, lassitude. HEENT:   No headaches,  Difficulty swallowing,  Tooth/dental problems,  Sore throat,                No sneezing, itching, ear ache, nasal congestion, post nasal drip,   CV:  No chest pain,  Orthopnea, PND, swelling in lower extremities, anasarca, dizziness, palpitations  GI  No heartburn, indigestion, abdominal pain, nausea, vomiting, diarrhea, change in bowel habits, loss of appetite  Resp: Notes shortness of breath with exertion not at rest.  No excess mucus, no productive cough,  No non-productive cough,  No coughing up of blood.  No change in color of mucus.  No wheezing.  No chest wall deformity  Skin: no rash or lesions.  GU: no dysuria, change in color of urine, no urgency or frequency.  No flank pain.  MS:  No joint pain or swelling.  No decreased range of motion.  No back pain.  Psych:  No change in mood or affect. No depression or anxiety.  No memory loss.     Objective:   Physical Exam  Filed Vitals:   09/22/13 0925  BP: 126/84  Pulse: 73  Temp: 97.1 F (36.2 C)  TempSrc: Oral  Height: 5' 0.25" (1.53 m)  Weight: 148 lb (67.132 kg)  SpO2: 96%    Gen: Pleasant,  well-nourished, in no distress,  normal affect  ENT: No lesions,  mouth clear,  oropharynx clear, no postnasal drip  Neck: No JVD, no TMG, no carotid bruits  Lungs: No use of accessory muscles, no dullness to percussion, clear without rales or rhonchi  Cardiovascular: RRR, heart sounds normal, no murmur or gallops, no peripheral edema  Abdomen: soft and NT, no HSM,  BS normal  Musculoskeletal: No deformities, no cyanosis or clubbing  Neuro: alert, non focal  Skin: Warm, no lesions or rashes  No results found.       Assessment & Plan:   Asthma, moderate persistent Moderate persistent asthma stable at present Plan The pt declined flu vaccine and pneumonia vaccine Stay on asmanex, refill sent We will repeat Chest xray and pulmonary functions in early 2016 Return 4 months   SARCOIDOSIS, PULMONARY Hx of flare of sarcoid in 2014, seems quiescent now but concerned hypercalcemia is d/t sarcoidosis Plan Can give steroids PO if Ca continues to rise Agree with limit Ca in pt diet  Hypercalcemia Agree sarcoid related, see sarcoid assessment   Updated Medication List Outpatient Encounter Prescriptions as of 09/22/2013  Medication Sig  . acyclovir (ZOVIRAX) 5 % cream Apply topically as needed.    [name] amLODipine (NORVASC) 5 MG tablet TAKE 1/2 TO 1 TABLET  BY MOUTH ONCE DAILY  . clotrimazole-betamethasone (LOTRISONE) cream Apply 1 application topically 2 (two) times daily.  . Dorzolamide HCl-Timolol Mal (COSOPT OP) 1 drop two times a day in left eye  . fluticasone (FLONASE) 50 MCG/ACT nasal spray Place 2 sprays into the nose daily as needed.   . Hypertonic Nasal Wash (NASAFLO NETI POT NASAL WASH NA) Place into the nose as needed.   . K-EFFERVESCENT 25 MEQ disintegrating tablet DISSOLVE ONE TABLET IN THE MOUTH TWICE A DAY  . losartan (COZAAR) 100 MG tablet TAKE 1 TABLET BY MOUTH ONCE A DAY  . meloxicam (MOBIC) 15 MG tablet Take 15 mg by mouth daily as needed for pain.  . mometasone  (ASMANEX) 220 MCG/INH inhaler Inhale 2 puffs into the lungs daily as needed.  . Multiple Vitamins-Minerals (HAIR/SKIN/NAILS) TABS Take by mouth 2 (two) times daily.   . nebivolol (BYSTOLIC) 10 MG tablet Take 10 mg by mouth daily.  . Pyridoxine HCl (B-6) 100 MG TABS Take 2-3 tablets by mouth daily.   . valACYclovir (VALTREX) 1000 MG tablet 1/2 tablet two times a day  . [DISCONTINUED] mometasone (ASMANEX 60 METERED DOSES) 220 MCG/INH inhaler Inhale 2 puffs into the lungs daily.  . [DISCONTINUED] Calcium-Magnesium-Vitamin D (SUPER CAL-MAG-D) [phone] MG-MG-UNIT TABS Take by mouth. 1-2 daily  . [DISCONTINUED] Cholecalciferol (VITAMIN D) 2000 UNITS tablet Take 2,000 Units by mouth daily.  . [DISCONTINUED] Cod Liver Oil OIL Take by mouth. 5 times weekly

## 2013-09-22 NOTE — Assessment & Plan Note (Signed)
Agree sarcoid related, see sarcoid assessment

## 2013-09-22 NOTE — Assessment & Plan Note (Signed)
Moderate persistent asthma stable at present Plan The pt declined flu vaccine and pneumonia vaccine Stay on asmanex, refill sent We will repeat Chest xray and pulmonary functions in early 2016 Return 4 months

## 2013-09-22 NOTE — Assessment & Plan Note (Signed)
Hx of flare of sarcoid in 2014, seems quiescent now but concerned hypercalcemia is d/t sarcoidosis Plan Can give steroids PO if Ca continues to rise Agree with limit Ca in pt diet

## 2013-09-22 NOTE — Patient Instructions (Signed)
You declined flu vaccine and pneumonia vaccine Stay on asmanex, refill sent We will repeat Chest xray and pulmonary functions in early 2016 Return 4 months

## 2013-09-27 ENCOUNTER — Other Ambulatory Visit: Payer: Self-pay | Admitting: Internal Medicine

## 2013-10-21 ENCOUNTER — Other Ambulatory Visit: Payer: Self-pay | Admitting: Internal Medicine

## 2013-11-18 ENCOUNTER — Other Ambulatory Visit: Payer: Self-pay | Admitting: Obstetrics and Gynecology

## 2013-11-18 DIAGNOSIS — N644 Mastodynia: Secondary | ICD-10-CM

## 2013-11-30 ENCOUNTER — Other Ambulatory Visit: Payer: 59

## 2013-12-02 ENCOUNTER — Ambulatory Visit
Admission: RE | Admit: 2013-12-02 | Discharge: 2013-12-02 | Disposition: A | Discharge status: Home / Self Care | Payer: 59 | Source: Ambulatory Visit | Attending: Obstetrics and Gynecology | Admitting: Obstetrics and Gynecology

## 2013-12-02 DIAGNOSIS — N644 Mastodynia: Secondary | ICD-10-CM

## 2013-12-03 ENCOUNTER — Ambulatory Visit: Payer: 59 | Admitting: Internal Medicine

## 2013-12-13 ENCOUNTER — Other Ambulatory Visit: Payer: Self-pay | Admitting: Internal Medicine

## 2013-12-15 ENCOUNTER — Encounter: Payer: Self-pay | Admitting: Internal Medicine

## 2013-12-15 ENCOUNTER — Ambulatory Visit (INDEPENDENT_AMBULATORY_CARE_PROVIDER_SITE_OTHER): Payer: 59 | Admitting: Internal Medicine

## 2013-12-15 VITALS — BP 130/80 | HR 82 | Temp 98.1°F | Ht 60.0 in | Wt 153.0 lb

## 2013-12-15 DIAGNOSIS — F418 Other specified anxiety disorders: Secondary | ICD-10-CM

## 2013-12-15 DIAGNOSIS — E538 Deficiency of other specified B group vitamins: Secondary | ICD-10-CM | POA: Insufficient documentation

## 2013-12-15 MED ORDER — CLONAZEPAM 0.5 MG PO TABS: 0.5000 mg | ORAL_TABLET | Freq: Two times a day (BID) | ORAL | Status: DC | PRN

## 2013-12-15 MED ORDER — CYANOCOBALAMIN 1000 MCG/ML IJ SOLN
1000.0000 ug | Freq: Once | INTRAMUSCULAR | Status: AC
  Administered 2013-12-15: 1000 ug via INTRAMUSCULAR

## 2013-12-15 MED ORDER — VORTIOXETINE HBR 10 MG PO TABS: 1.0000 | ORAL_TABLET | Freq: Every day | ORAL | Status: DC

## 2013-12-15 NOTE — Patient Instructions (Signed)
Generalized Anxiety Disorder Generalized anxiety disorder (GAD) is a mental disorder. It interferes with life functions, including relationships, work, and school. GAD is different from normal anxiety, which everyone experiences at some point in their lives in response to specific life events and activities. Normal anxiety actually helps us prepare for and get through these life events and activities. Normal anxiety goes away after the event or activity is over.  GAD causes anxiety that is not necessarily related to specific events or activities. It also causes excess anxiety in proportion to specific events or activities. The anxiety associated with GAD is also difficult to control. GAD can vary from mild to severe. People with severe GAD can have intense waves of anxiety with physical symptoms (panic attacks).  SYMPTOMS The anxiety and worry associated with GAD are difficult to control. This anxiety and worry are related to many life events and activities and also occur more days than not for 6 months or longer. People with GAD also have three or more of the following symptoms (one or more in children):  Restlessness.   Fatigue.  Difficulty concentrating.   Irritability.  Muscle tension.  Difficulty sleeping or unsatisfying sleep. DIAGNOSIS GAD is diagnosed through an assessment by your health care provider. Your health care provider will ask you questions aboutyour mood,physical symptoms, and events in your life. Your health care provider may ask you about your medical history and use of alcohol or drugs, including prescription medicines. Your health care provider may also do a physical exam and blood tests. Certain medical conditions and the use of certain substances can cause symptoms similar to those associated with GAD. Your health care provider may refer you to a mental health specialist for further evaluation. TREATMENT The following therapies are usually used to treat GAD:    Medication. Antidepressant medication usually is prescribed for long-term daily control. Antianxiety medicines may be added in severe cases, especially when panic attacks occur.   Talk therapy (psychotherapy). Certain types of talk therapy can be helpful in treating GAD by providing support, education, and guidance. A form of talk therapy called cognitive behavioral therapy can teach you healthy ways to think about and react to daily life events and activities.  Stress managementtechniques. These include yoga, meditation, and exercise and can be very helpful when they are practiced regularly. A mental health specialist can help determine which treatment is best for you. Some people see improvement with one therapy. However, other people require a combination of therapies. Document Released: 04/27/2012 Document Revised: 05/17/2013 Document Reviewed: 04/27/2012 Exit[employer] Patient Information 2015 [employer]. This information is not intended to replace advice given to you by your health care provider. Make sure you discuss any questions you have with your health care provider.  

## 2013-12-15 NOTE — Assessment & Plan Note (Addendum)
Will start Brintellix at 10 mg, will reevaluate and may increase the dose  Will use Klonopin as a bridge to control the anxiety noe

## 2013-12-15 NOTE — Progress Notes (Signed)
   Subjective:    Patient ID: [name], female    DOB: [birth year], 53 y.o.   MRN: [id number]  HPI New to me she complains of excess stress at work and feels sad, anxious, panicky. She has crying spells, anhedonia, weight gain, and frequent awakenings.   Review of Systems  Constitutional: Positive for fatigue and unexpected weight change. Negative for fever, chills, diaphoresis and appetite change.  HENT: Negative.   Eyes: Negative.   Respiratory: Negative.  Negative for cough, choking, chest tightness, shortness of breath and stridor.   Cardiovascular: Negative.  Negative for chest pain, palpitations and leg swelling.  Gastrointestinal: Negative.  Negative for abdominal pain.  Endocrine: Negative.   Genitourinary: Negative.   Musculoskeletal: Negative.   Skin: Negative.   Allergic/Immunologic: Negative.   Neurological: Negative.   Hematological: Negative.  Negative for adenopathy. Does not bruise/bleed easily.  Psychiatric/Behavioral: Positive for sleep disturbance, dysphoric mood and decreased concentration. Negative for suicidal ideas, hallucinations, behavioral problems, confusion, self-injury and agitation. The patient is nervous/anxious. The patient is not hyperactive.        Objective:   Physical Exam  Constitutional: She is oriented to person, place, and time. She appears well-developed and well-nourished. No distress.  HENT:  Head: Normocephalic and atraumatic.  Mouth/Throat: Oropharynx is clear and moist. No oropharyngeal exudate.  Eyes: Conjunctivae are normal. Right eye exhibits no discharge. Left eye exhibits no discharge. No scleral icterus.  Neck: Normal range of motion. Neck supple. No JVD present. No tracheal deviation present. No thyromegaly present.  Cardiovascular: Normal rate, regular rhythm, normal heart sounds and intact distal pulses.  Exam reveals no gallop and no friction rub.   No murmur heard. Pulmonary/Chest: Effort normal and breath sounds  normal. No stridor. No respiratory distress. She has no wheezes. She has no rales. She exhibits no tenderness.  Abdominal: Soft. Bowel sounds are normal. She exhibits no distension and no mass. There is no tenderness. There is no rebound and no guarding.  Musculoskeletal: Normal range of motion. She exhibits no edema or tenderness.  Lymphadenopathy:    She has no cervical adenopathy.  Neurological: She is oriented to person, place, and time.  Skin: Skin is warm and dry. No rash noted. She is not diaphoretic. No erythema. No pallor.  Psychiatric: Her speech is normal and behavior is normal. Judgment and thought content normal. Her mood appears anxious. Her affect is not angry, not blunt, not labile and not inappropriate. She is not agitated, not slowed, not withdrawn and not combative. Cognition and memory are normal. She exhibits a depressed mood. She expresses no homicidal and no suicidal ideation. She expresses no suicidal plans and no homicidal plans. She is attentive.  Vitals reviewed.         Assessment & Plan:

## 2013-12-15 NOTE — Progress Notes (Signed)
Pre visit review using our clinic review tool, if applicable. No additional management support is needed unless otherwise documented below in the visit note. 

## 2013-12-17 ENCOUNTER — Ambulatory Visit: Payer: 59 | Admitting: Internal Medicine

## 2013-12-28 ENCOUNTER — Ambulatory Visit (INDEPENDENT_AMBULATORY_CARE_PROVIDER_SITE_OTHER): Payer: 59 | Admitting: Internal Medicine

## 2013-12-28 ENCOUNTER — Other Ambulatory Visit (INDEPENDENT_AMBULATORY_CARE_PROVIDER_SITE_OTHER): Payer: 59

## 2013-12-28 ENCOUNTER — Encounter: Payer: Self-pay | Admitting: Internal Medicine

## 2013-12-28 ENCOUNTER — Telehealth: Payer: Self-pay | Admitting: Internal Medicine

## 2013-12-28 VITALS — BP 150/100 | HR 72 | Temp 98.1°F | Wt 151.0 lb

## 2013-12-28 DIAGNOSIS — M79644 Pain in right finger(s): Secondary | ICD-10-CM

## 2013-12-28 LAB — CBC WITH DIFFERENTIAL/PLATELET
BASOS PCT: 0.2 % (ref 0.0–3.0)
Basophils Absolute: 0 10*3/uL (ref 0.0–0.1)
EOS PCT: 3.5 % (ref 0.0–5.0)
Eosinophils Absolute: 0.3 10*3/uL (ref 0.0–0.7)
HCT: 42.7 % (ref 36.0–46.0)
HEMOGLOBIN: 14.2 g/dL (ref 12.0–15.0)
LYMPHS PCT: 20.7 % (ref 12.0–46.0)
Lymphs Abs: 1.9 10*3/uL (ref 0.7–4.0)
MCHC: 33.3 g/dL (ref 30.0–36.0)
MCV: 92.7 fl (ref 78.0–100.0)
Monocytes Absolute: 1.2 10*3/uL — ABNORMAL HIGH (ref 0.1–1.0)
Monocytes Relative: 12.8 % — ABNORMAL HIGH (ref 3.0–12.0)
NEUTROS PCT: 62.8 % (ref 43.0–77.0)
Neutro Abs: 5.9 10*3/uL (ref 1.4–7.7)
Platelets: 320 10*3/uL (ref 150.0–400.0)
RBC: 4.61 Mil/uL (ref 3.87–5.11)
RDW: 14.3 % (ref 11.5–15.5)
WBC: 9.3 10*3/uL (ref 4.0–10.5)

## 2013-12-28 LAB — BASIC METABOLIC PANEL
BUN: 15 mg/dL (ref 6–23)
CALCIUM: 10.1 mg/dL (ref 8.4–10.5)
CO2: 26 mEq/L (ref 19–32)
CREATININE: 1.1 mg/dL (ref 0.4–1.2)
Chloride: 103 mEq/L (ref 96–112)
GFR: 68.62 mL/min (ref >60.00)
Glucose, Bld: 91 mg/dL (ref 70–99)
Potassium: 3.9 mEq/L (ref 3.5–5.1)
SODIUM: 135 meq/L (ref 135–145)

## 2013-12-28 LAB — HEPATIC FUNCTION PANEL
ALK PHOS: 84 U/L (ref 39–117)
ALT: 27 U/L (ref 0–35)
AST: 28 U/L (ref 0–37)
Albumin: 4.2 g/dL (ref 3.5–5.2)
BILIRUBIN DIRECT: 0.1 mg/dL (ref 0.0–0.3)
TOTAL PROTEIN: 8.1 g/dL (ref 6.0–8.3)
Total Bilirubin: 0.6 mg/dL (ref 0.2–1.2)

## 2013-12-28 LAB — SEDIMENTATION RATE: SED RATE: 36 mm/h — AB (ref 0–22)

## 2013-12-28 LAB — URIC ACID: URIC ACID, SERUM: 4.6 mg/dL (ref 2.4–7.0)

## 2013-12-28 MED ORDER — INDOMETHACIN 50 MG PO CAPS: 50.0000 mg | ORAL_CAPSULE | Freq: Three times a day (TID) | ORAL | Status: DC | PRN

## 2013-12-28 MED ORDER — TRAMADOL HCL 50 MG PO TABS: 50.0000 mg | ORAL_TABLET | Freq: Two times a day (BID) | ORAL | Status: DC | PRN

## 2013-12-28 NOTE — Assessment & Plan Note (Signed)
Labs

## 2013-12-28 NOTE — Progress Notes (Signed)
Pre visit review using our clinic review tool, if applicable. No additional management support is needed unless otherwise documented below in the visit note. 

## 2013-12-28 NOTE — Telephone Encounter (Signed)
Patient request script for Indocin to be sent to [facility] in [place].

## 2013-12-28 NOTE — Assessment & Plan Note (Signed)
R 3d finger PIP is swollen and tender - ?gout Labs  Indocin  Tramadol prn ACE

## 2013-12-28 NOTE — Telephone Encounter (Signed)
MD has already sent rx to [facility],,,.lmb

## 2013-12-28 NOTE — Progress Notes (Signed)
   Subjective:   C/o R 3d finger PIP is swollen and tender x 2-3 d  F/u elev Ca  F/u severe R hip pain. Much better now - in PT. Seing Dr Mayer Camel    HPI  F/u Neck pain - better F/u HTN, allergies  BP Readings from Last 3 Encounters:  12/28/13 150/100  12/15/13 130/80  09/22/13 126/84   Wt Readings from Last 3 Encounters:  12/28/13 151 lb (68.493 kg)  12/15/13 153 lb (69.4 kg)  09/22/13 148 lb (67.132 kg)     Review of Systems  Constitutional: Negative for chills, activity change, appetite change, fatigue and unexpected weight change.  HENT: Negative for congestion, mouth sores and sinus pressure.   Eyes: Negative for visual disturbance.  Respiratory: Negative for cough and chest tightness.   Gastrointestinal: Negative for nausea and abdominal pain.  Genitourinary: Negative for frequency, difficulty urinating and vaginal pain.  Musculoskeletal: Negative for back pain, gait problem, neck pain and neck stiffness.  Skin: Negative for pallor and rash.  Neurological: Negative for dizziness, tremors, weakness and headaches.  Psychiatric/Behavioral: Negative for suicidal ideas, confusion and sleep disturbance.       Objective:   Physical Exam  Constitutional: She appears well-developed. No distress.  HENT:  Head: Normocephalic.  Right Ear: External ear normal.  Left Ear: External ear normal.  Nose: Nose normal.  Mouth/Throat: Oropharynx is clear and moist.  Eyes: Conjunctivae are normal. Pupils are equal, round, and reactive to light. Right eye exhibits no discharge. Left eye exhibits no discharge.  Neck: Normal range of motion. Neck supple. No JVD present. No tracheal deviation present. No thyromegaly present.  Cardiovascular: Normal rate, regular rhythm and normal heart sounds.   Pulmonary/Chest: No stridor. No respiratory distress. She has no wheezes.  Abdominal: Soft. Bowel sounds are normal. She exhibits no distension and no mass. There is no tenderness. There is no  rebound and no guarding.  Musculoskeletal: She exhibits tenderness. She exhibits no edema.  Lymphadenopathy:    She has no cervical adenopathy.  Neurological: She displays normal reflexes. No cranial nerve deficit. She exhibits normal muscle tone. Coordination normal.  Skin: No rash noted. No erythema.  Psychiatric: She has a normal mood and affect. Her behavior is normal. Judgment and thought content normal.  R 3d finger PIP is swollen and tender  Lab Results  Component Value Date   WBC 9.3 08/06/2013   HGB 13.5 08/06/2013   HCT 39.4 08/06/2013   PLT 340.0 08/06/2013   GLUCOSE 83 09/10/2013   CHOL 150 01/25/2013   TRIG 55.0 01/25/2013   HDL 38.90* 01/25/2013   LDLCALC 100* 01/25/2013   ALT 24 08/06/2013   AST 28 08/06/2013   NA 140 09/10/2013   K 3.6 09/10/2013   CL 102 09/10/2013   CREATININE 1.2 09/10/2013   BUN 21 09/10/2013   CO2 29 09/10/2013   TSH 1.64 09/10/2013   HGBA1C 5.9 05/18/2013          Assessment & Plan:

## 2013-12-28 NOTE — Patient Instructions (Signed)
To work in 1 week

## 2013-12-29 ENCOUNTER — Telehealth: Payer: Self-pay | Admitting: Internal Medicine

## 2013-12-29 LAB — PTH, INTACT AND CALCIUM
Calcium: 10.1 mg/dL (ref 8.4–10.5)
PTH: 10 pg/mL — ABNORMAL LOW (ref 14–64)

## 2013-12-29 NOTE — Telephone Encounter (Signed)
Keep finger elevated Cont meds Labs are ok Sch ov w/Dr Tamala Julian this week pls Thx

## 2013-12-29 NOTE — Telephone Encounter (Signed)
Pt called in and wanted to tell Dr Camila Li that her Finger has gotten bigger.  Also wanted to know if someone could call her about her lab results

## 2013-12-30 NOTE — Telephone Encounter (Signed)
Pt informed. Labs upfront for pt to p/u.

## 2014-01-03 ENCOUNTER — Ambulatory Visit (INDEPENDENT_AMBULATORY_CARE_PROVIDER_SITE_OTHER): Payer: 59 | Admitting: Family Medicine

## 2014-01-03 ENCOUNTER — Encounter: Payer: Self-pay | Admitting: *Deleted

## 2014-01-03 ENCOUNTER — Other Ambulatory Visit (INDEPENDENT_AMBULATORY_CARE_PROVIDER_SITE_OTHER): Payer: 59

## 2014-01-03 ENCOUNTER — Ambulatory Visit (INDEPENDENT_AMBULATORY_CARE_PROVIDER_SITE_OTHER)
Admission: RE | Admit: 2014-01-03 | Discharge: 2014-01-03 | Disposition: A | Discharge status: Home / Self Care | Payer: 59 | Source: Ambulatory Visit | Attending: Family Medicine | Admitting: Family Medicine

## 2014-01-03 ENCOUNTER — Encounter: Payer: Self-pay | Admitting: Family Medicine

## 2014-01-03 VITALS — BP 124/82 | HR 69 | Ht 60.25 in | Wt 151.0 lb

## 2014-01-03 DIAGNOSIS — M79644 Pain in right finger(s): Secondary | ICD-10-CM

## 2014-01-03 DIAGNOSIS — S63289A Dislocation of proximal interphalangeal joint of unspecified finger, initial encounter: Secondary | ICD-10-CM

## 2014-01-03 NOTE — Patient Instructions (Addendum)
Good to see you.  Happy holidays!  Ice 10 minutes at end of the day Indomethacin as you needed We will get xray Wear the brace day and night for next week then nightly for 1 week See me in 2 weeks.

## 2014-01-03 NOTE — Progress Notes (Signed)
  [name] [place], [place] [address] Phone: [phone] Subjective:    CC: New problem finger pain  [id number] [name] is a 53 y.o. female coming in with complaint of finger pain. Patient does have a past medical history for sarcoidosis. Patient states right 3rd finger swelling and pain. Patient was seen by primary care provider and was can scared of a inflammatory arthritis. Patient is now the brain he true injury. Patient states it was significant he swollen and seems to be improving. Patient states that still a dull throbbing pain. Patient has been out of work for some time secondary to the discomfort. Patient is taking indomethacin with some mild improvement in pain. Patient does do a lot of typing and does not know if she will be able to with this pain.      Past medical history, social, surgical and family history all reviewed in electronic medical record.   Review of Systems: No headache, visual changes, nausea, vomiting, diarrhea, constipation, dizziness, abdominal pain, skin rash, fevers, chills, night sweats, weight loss, swollen lymph nodes, body aches, joint swelling, muscle aches, chest pain, shortness of breath, mood changes.   Objective Blood pressure 124/82, pulse 69, height 5' 0.25" (1.53 m), weight 151 lb (68.493 kg), SpO2 94 %.  General: No apparent distress alert and oriented x3 mood and affect normal, dressed appropriately.  HEENT: Pupils equal, extraocular movements intact  Respiratory: Patient's speak in full sentences and does not appear short of breath  Cardiovascular: No lower extremity edema, non tender, no erythema  Skin: Warm dry intact with no signs of infection or rash on extremities or on axial skeleton.  Abdomen: Soft nontender  Neuro: Cranial nerves II through XII are intact, neurovascularly intact in all extremities with 2+ DTRs and 2+ pulses.  Lymph: No lymphadenopathy of posterior or anterior  cervical chain or axillae bilaterally.  Gait normal with good balance and coordination.  MSK:  Non tender with full range of motion and good stability and symmetric strength and tone of shoulders, elbows, wrist, hip, knee and ankles bilaterally.  Hand exam shows the patient does have swelling still around the PIP joint of the third finger. Patient does have full motion. Severely tender mostly on the ulnar aspect of the PIP and just proximal to this area.  Limited muscular skeletal ultrasound was performed and interpreted by me today. Patient does have a phalanx fracture that is minimally displaced of the proximal aspect of the PIP but this is extra articular. Impression: As stated above    Impression and Recommendations:     This case required medical decision making of moderate complexity.

## 2014-01-03 NOTE — Assessment & Plan Note (Signed)
Patient I think did have actually a dislocation fracture of the PIP joint with patient unwilling to tell me the injury. Patient was put in a brace today. We will hold patient out until next week after the holidays from work. Patient does have full range of motion and this is extra articular so she will heal fine. We discussed icing protocol as well as a vitamin D supplementation. Patient had to limit her vitamin D though secondary to the sarcoidosis. We discussed following up in 2 weeks for further evaluation.  Spent greater than 25 minutes with patient face-to-face and had greater than 50% of counseling including as described above in assessment and plan.

## 2014-01-10 ENCOUNTER — Encounter: Payer: Self-pay | Admitting: Internal Medicine

## 2014-01-10 ENCOUNTER — Ambulatory Visit (INDEPENDENT_AMBULATORY_CARE_PROVIDER_SITE_OTHER): Payer: 59 | Admitting: Internal Medicine

## 2014-01-10 VITALS — BP 130/92 | HR 68 | Temp 98.7°F | Resp 16 | Wt 151.0 lb

## 2014-01-10 DIAGNOSIS — M8580 Other specified disorders of bone density and structure, unspecified site: Secondary | ICD-10-CM

## 2014-01-10 DIAGNOSIS — M79644 Pain in right finger(s): Secondary | ICD-10-CM

## 2014-01-10 DIAGNOSIS — S63289D Dislocation of proximal interphalangeal joint of unspecified finger, subsequent encounter: Secondary | ICD-10-CM

## 2014-01-10 NOTE — Progress Notes (Signed)
Pre visit review using our clinic review tool, if applicable. No additional management support is needed unless otherwise documented below in the visit note. 

## 2014-01-10 NOTE — Assessment & Plan Note (Addendum)
12/15 R 3d finger PIP is swollen and tender - a dislocation fracture of the PIP joint  FMLA filled out >20 min BDS

## 2014-01-10 NOTE — Progress Notes (Signed)
   Subjective:   F/u R 3d finger PIP is swollen and tender - a dislocation fracture of the PIP joint   F/u elev Ca  F/u severe R hip pain. Much better now - in PT. Seing Dr Mayer Camel    HPI  F/u Neck pain - better F/u HTN, allergies  BP Readings from Last 3 Encounters:  01/10/14 130/92  01/03/14 124/82  12/28/13 150/100   Wt Readings from Last 3 Encounters:  01/10/14 151 lb (68.493 kg)  01/03/14 151 lb (68.493 kg)  12/28/13 151 lb (68.493 kg)     Review of Systems  Constitutional: Negative for chills, activity change, appetite change, fatigue and unexpected weight change.  HENT: Negative for congestion, mouth sores and sinus pressure.   Eyes: Negative for visual disturbance.  Respiratory: Negative for cough and chest tightness.   Gastrointestinal: Negative for nausea and abdominal pain.  Genitourinary: Negative for frequency, difficulty urinating and vaginal pain.  Musculoskeletal: Negative for back pain, gait problem, neck pain and neck stiffness.  Skin: Negative for pallor and rash.  Neurological: Negative for dizziness, tremors, weakness and headaches.  Psychiatric/Behavioral: Negative for suicidal ideas, confusion and sleep disturbance.       Objective:   Physical Exam  Constitutional: She appears well-developed. No distress.  HENT:  Head: Normocephalic.  Right Ear: External ear normal.  Left Ear: External ear normal.  Nose: Nose normal.  Mouth/Throat: Oropharynx is clear and moist.  Eyes: Conjunctivae are normal. Pupils are equal, round, and reactive to light. Right eye exhibits no discharge. Left eye exhibits no discharge.  Neck: Normal range of motion. Neck supple. No JVD present. No tracheal deviation present. No thyromegaly present.  Cardiovascular: Normal rate, regular rhythm and normal heart sounds.   Pulmonary/Chest: No stridor. No respiratory distress. She has no wheezes.  Abdominal: Soft. Bowel sounds are normal. She exhibits no distension and no mass.  There is no tenderness. There is no rebound and no guarding.  Musculoskeletal: She exhibits tenderness. She exhibits no edema.  Lymphadenopathy:    She has no cervical adenopathy.  Neurological: She displays normal reflexes. No cranial nerve deficit. She exhibits normal muscle tone. Coordination normal.  Skin: No rash noted. No erythema.  Psychiatric: She has a normal mood and affect. Her behavior is normal. Judgment and thought content normal.  R 3d finger PIP is less swollen and tender - in a brace  Lab Results  Component Value Date   WBC 9.3 12/28/2013   HGB 14.2 12/28/2013   HCT 42.7 12/28/2013   PLT 320.0 12/28/2013   GLUCOSE 91 12/28/2013   CHOL 150 01/25/2013   TRIG 55.0 01/25/2013   HDL 38.90* 01/25/2013   LDLCALC 100* 01/25/2013   ALT 27 12/28/2013   AST 28 12/28/2013   NA 135 12/28/2013   K 3.9 12/28/2013   CL 103 12/28/2013   CREATININE 1.1 12/28/2013   BUN 15 12/28/2013   CO2 26 12/28/2013   TSH 1.64 09/10/2013   HGBA1C 5.9 05/18/2013          Assessment & Plan:

## 2014-01-19 ENCOUNTER — Ambulatory Visit (INDEPENDENT_AMBULATORY_CARE_PROVIDER_SITE_OTHER): Payer: 59 | Admitting: Family Medicine

## 2014-01-19 ENCOUNTER — Ambulatory Visit (INDEPENDENT_AMBULATORY_CARE_PROVIDER_SITE_OTHER)
Admission: RE | Admit: 2014-01-19 | Discharge: 2014-01-19 | Disposition: A | Discharge status: Home / Self Care | Payer: 59 | Source: Ambulatory Visit | Attending: Internal Medicine | Admitting: Internal Medicine

## 2014-01-19 ENCOUNTER — Encounter: Payer: Self-pay | Admitting: Family Medicine

## 2014-01-19 VITALS — BP 140/86 | HR 79 | Ht 60.25 in | Wt 151.0 lb

## 2014-01-19 DIAGNOSIS — S63289D Dislocation of proximal interphalangeal joint of unspecified finger, subsequent encounter: Secondary | ICD-10-CM

## 2014-01-19 DIAGNOSIS — M8580 Other specified disorders of bone density and structure, unspecified site: Secondary | ICD-10-CM

## 2014-01-19 NOTE — Progress Notes (Signed)
  [name], [name] Phone: [phone] Subjective:    CC: finger pain follow up  [id number] [name] is a 54 y.o. female coming in with complaint of finger pain. Patient does have a past medical history for sarcoidosis. Patient states right 3rd finger swelling and pain. Patient actually did have a fracture of her feelings finger. X-rays did show some mild inflammatory arthritic changes as well. Patient states that it is feeling better. Patient continues to wear the splint on a fairly daily basis. Patient states the pain is much better and has full range of motion. Denies any new symptoms.      Past medical history, social, surgical and family history all reviewed in electronic medical record.   Review of Systems: No headache, visual changes, nausea, vomiting, diarrhea, constipation, dizziness, abdominal pain, skin rash, fevers, chills, night sweats, weight loss, swollen lymph nodes, body aches, joint swelling, muscle aches, chest pain, shortness of breath, mood changes.   Objective Blood pressure 140/86, pulse 79, height 5' 0.25" (1.53 m), weight 151 lb (68.493 kg), SpO2 98 %.  General: No apparent distress alert and oriented x3 mood and affect normal, dressed appropriately.  HEENT: Pupils equal, extraocular movements intact  Respiratory: Patient's speak in full sentences and does not appear short of breath  Cardiovascular: No lower extremity edema, non tender, no erythema  Skin: Warm dry intact with no signs of infection or rash on extremities or on axial skeleton.  Abdomen: Soft nontender  Neuro: Cranial nerves II through XII are intact, neurovascularly intact in all extremities with 2+ DTRs and 2+ pulses.  Lymph: No lymphadenopathy of posterior or anterior cervical chain or axillae bilaterally.  Gait normal with good balance and coordination.  MSK:  Non tender with full range of motion and good stability and  symmetric strength and tone of shoulders, elbows, wrist, hip, knee and ankles bilaterally.  Hand exam shows the patient does have callus formation noted over the radial aspect of the middle finger. Patient minimally tender in this area. Patient has full range of motion. Trace swelling still noted.  Limited muscular skeletal ultrasound was performed and interpreted by me today. Patient does have a phalanx fracture good callus fracture.  Impression: Healing fracture    Impression and Recommendations:     This case required medical decision making of moderate complexity.

## 2014-01-19 NOTE — Assessment & Plan Note (Signed)
Patient overall seems to be doing relatively better. We discussed getting another x-ray in 3 weeks to make sure that this is resolving. Patient is also noted to the icing and most of the splint for one more week. Patient is not pain-free she'll come back again for further evaluation.

## 2014-01-19 NOTE — Patient Instructions (Signed)
Good to see you Try half the bystolic and see if that helps Get a multivitamin with iron conitnue the B12 Wear brace for 1 week.  Come back and get xrays in 1-2 weeks See me again in 3 weeks if not better.

## 2014-01-27 ENCOUNTER — Ambulatory Visit: Payer: 59 | Admitting: Internal Medicine

## 2014-02-03 ENCOUNTER — Ambulatory Visit (INDEPENDENT_AMBULATORY_CARE_PROVIDER_SITE_OTHER)
Admission: RE | Admit: 2014-02-03 | Discharge: 2014-02-03 | Disposition: A | Discharge status: Home / Self Care | Payer: 59 | Source: Ambulatory Visit | Attending: Family Medicine | Admitting: Family Medicine

## 2014-02-03 DIAGNOSIS — S63289D Dislocation of proximal interphalangeal joint of unspecified finger, subsequent encounter: Secondary | ICD-10-CM

## 2014-02-14 ENCOUNTER — Encounter: Payer: Self-pay | Admitting: *Deleted

## 2014-02-14 ENCOUNTER — Encounter: Payer: Self-pay | Admitting: Family Medicine

## 2014-02-14 ENCOUNTER — Encounter: Payer: Self-pay | Admitting: Internal Medicine

## 2014-02-14 ENCOUNTER — Ambulatory Visit (INDEPENDENT_AMBULATORY_CARE_PROVIDER_SITE_OTHER): Payer: 59 | Admitting: Internal Medicine

## 2014-02-14 ENCOUNTER — Ambulatory Visit (INDEPENDENT_AMBULATORY_CARE_PROVIDER_SITE_OTHER): Payer: 59 | Admitting: Family Medicine

## 2014-02-14 VITALS — BP 142/90 | HR 91 | Temp 97.3°F | Wt 150.0 lb

## 2014-02-14 VITALS — BP 130/84 | HR 84 | Ht 60.25 in | Wt 149.0 lb

## 2014-02-14 DIAGNOSIS — M999 Biomechanical lesion, unspecified: Secondary | ICD-10-CM

## 2014-02-14 DIAGNOSIS — M9901 Segmental and somatic dysfunction of cervical region: Secondary | ICD-10-CM

## 2014-02-14 DIAGNOSIS — F32A Depression, unspecified: Secondary | ICD-10-CM

## 2014-02-14 DIAGNOSIS — F329 Major depressive disorder, single episode, unspecified: Secondary | ICD-10-CM

## 2014-02-14 DIAGNOSIS — I10 Essential (primary) hypertension: Secondary | ICD-10-CM

## 2014-02-14 DIAGNOSIS — M9902 Segmental and somatic dysfunction of thoracic region: Secondary | ICD-10-CM

## 2014-02-14 DIAGNOSIS — M542 Cervicalgia: Secondary | ICD-10-CM

## 2014-02-14 DIAGNOSIS — E538 Deficiency of other specified B group vitamins: Secondary | ICD-10-CM

## 2014-02-14 DIAGNOSIS — F418 Other specified anxiety disorders: Secondary | ICD-10-CM

## 2014-02-14 DIAGNOSIS — M9908 Segmental and somatic dysfunction of rib cage: Secondary | ICD-10-CM

## 2014-02-14 MED ORDER — VERAPAMIL HCL ER 180 MG PO CP24: 180.0000 mg | ORAL_CAPSULE | Freq: Every day | ORAL | Status: DC

## 2014-02-14 NOTE — Progress Notes (Signed)
Pre visit review using our clinic review tool, if applicable. No additional management support is needed unless otherwise documented below in the visit note. 

## 2014-02-14 NOTE — Assessment & Plan Note (Signed)
Continue with current prescription therapy as reflected on the Med list.  

## 2014-02-14 NOTE — Patient Instructions (Signed)
Good to see you Ice when you need it Try to have computer at eye level.  Try saran wrap for the tennis ball.  New exercises 3 times a week See me again in 3 weeks for more manipulation.  STRETCH - Flexion, Standing  Stand with good posture. With an underhand grip on your right / left and an overhand grip on the opposite hand, grasp a broomstick or cane so that your hands are a little more than shoulder-width apart.  Keeping your right / left elbow straight and shoulder muscles relaxed, push the stick with your opposite hand to raise your right / left arm in front of your body and then overhead. Raise your arm until you feel a stretch in your right / left shoulder, but before you have increased shoulder pain.  Avoid shrugging your right / left shoulder as your arm rises by keeping your shoulder blade tucked down and toward your mid-back spine. Hold __________ seconds.  Slowly return to the starting position. Repeat __________ times. Complete this exercise __________ times per day. STRETCH - Abduction, Supine  Stand with good posture. With an underhand grip on your right / left and an overhand grip on the opposite hand, grasp a broomstick or cane so that your hands are a little more than shoulder-width apart.  Keeping your right / left elbow straight and shoulder muscles relaxed, push the stick with your opposite hand to raise your right / left arm out to the side of your body and then overhead. Raise your arm until you feel a stretch in your right / left shoulder, but before you have increased shoulder pain.  Avoid shrugging your right / left shoulder as your arm rises by keeping your shoulder blade tucked down and toward your mid-back spine. Hold __________ seconds.  Slowly return to the starting position. Repeat __________ times. Complete this exercise __________ times per day. ROM - Flexion, Active-Assisted  Lie on your back. You may bend your knees for comfort.  Grasp a broomstick  or cane so your hands are about shoulder-width apart. Your right / left hand should grip the end of the stick/cane so that your hand is positioned "thumbs-up," as if you were about to shake hands.  Using your healthy arm to lead, raise your right / left arm overhead until you feel a gentle stretch in your shoulder. Hold __________ seconds.  Use the stick/cane to assist in returning your right / left arm to its starting position. Repeat __________ times. Complete this exercise __________ times per day.  STRENGTHENING EXERCISES - Scapular Winging (Serratus Anterior Palsy, Long Thoracic Nerve Injury) These exercises may help you when beginning to rehabilitate your injury. They may resolve your symptoms with or without further involvement from your physician, physical therapist or athletic trainer. While completing these exercises, remember:   Muscles can gain both the endurance and the strength needed for everyday activities through controlled exercises.  Complete these exercises as instructed by your physician, physical therapist or athletic trainer. Progress with the resistance and repetition exercises only as your caregiver advises.  You may experience muscle soreness or fatigue, but the pain or discomfort you are trying to eliminate should never worsen during these exercises. If this pain does worsen, stop and make certain you are following the directions exactly. If the pain is still present after adjustments, discontinue the exercise until you can discuss the trouble with your clinician.  During your recovery, avoid activity or exercises which involve actions that place your injured  hand or elbow above your head or behind your back or head. These positions stress the tissues which are trying to heal. STRENGTH - Scapular Depression and Adduction   With good posture, sit on a firm chair. Supported your arms in front of you with pillows, arm rests or a table top. Have your elbows in line with the  sides of your body.  Gently draw your shoulder blades down and toward your mid-back spine. Gradually increase the tension without tensing the muscles along the top of your shoulders and the back of your neck.  Hold for __________ seconds. Slowly release the tension and relax your muscles completely before completing the next repetition.  After you have practiced this exercise, remove the arm support and complete it in standing as well as sitting. Repeat __________ times. Complete this exercise __________ times per day.  STRENGTH - Scapular Protractors, Standing   Stand arms-length away from a wall. Place your hands on the wall, keeping your elbows straight.  Begin by dropping your shoulder blades down and toward your mid-back spine.  To strengthen your protractors, keep your shoulder blades down, but slide them forward on your rib cage. It will feel as if you are lifting the back of your rib cage away from the wall. This is a subtle motion and can be challenging to complete. Ask your clinician for further instruction if you are not sure you are doing the exercise correctly.  Hold for __________ seconds. Slowly return to the starting position, resting the muscles completely before completing the next repetition. Repeat __________ times. Complete this exercise __________ times per day. STRENGTH - Scapular Protractors, Supine  Lie on your back on a firm surface. Extend your right / left arm straight into the air while holding a __________ weight in your hand.  Keeping your head and back in place, lift your shoulder off the floor.  Hold __________ seconds. Slowly return to the starting position and allow your muscles to relax completely before completing the next repetition. Repeat __________ times. Complete this exercise __________ times per day. STRENGTH - Scapular Protractors, Quadruped  Get onto your hands and knees with your shoulders directly over your hands (or as close as you  comfortably can be).  Keeping your elbows locked, lift the back of your rib cage up into your shoulder blades so your mid-back rounds-out. Keep your neck muscles relaxed.  Hold this position for __________ seconds. Slowly return to the starting position and allow your muscles to relax completely before completing the next repetition. Repeat __________ times. Complete this exercise __________ times per day.  STRENGTH - Scapular Depressors  Keeping your feet on the floor, lift your bottom from the seat and lock your elbows.  Keeping your elbows straight, allow gravity to pull your body weight down. Your shoulders will rise toward your ears.  Raise your body against gravity by drawing your shoulder blades down your back, shortening the distance between your shoulders and ears. Although your feet should always maintain contact with the floor, your feet should progressively support less body weight as you get stronger.  Hold __________ seconds. In a controlled and slow manner, lower your body weight to begin the next repetition. Repeat __________ times. Complete this exercise __________ times per day.  STRENGTH - Shoulder Extensors, Prone  Lie on your stomach on a firm surface so that your right / left arm overhangs the edge. Rest your forehead on your opposite forearm. With your thumb facing away from your body and  your elbow straight, hold a __________ weight in your hand.  Squeeze your right / left shoulder blade to your mid-back spine and then slowly raise your arm behind you to the height of the bed.  Hold for __________ seconds. Slowly reverse the directions and return to the starting position, controlling the weight as you lower your arm. Repeat __________ times. Complete this exercise __________ times per day.  STRENGTH - Horizontal Abductors Choose one of the two oppositions to complete this exercise. Prone: lying on stomach:  Lie on your stomach on a firm surface so that your right /  left arm overhangs the edge. Rest your forehead on your opposite forearm. With your palm facing the floor and your elbow straight, hold a __________ weight in your hand.  Squeeze your right / left shoulder blade to your mid-back spine and then slowly raise your arm to the height of the bed.  Hold for __________ seconds. Slowly reverse the directions and return to the starting position, controlling the weight as you lower your arm. Repeat __________ times. Complete this exercise __________ times per day. Standing:  Secure a rubber exercise band/tubing so that it is at the height of your shoulders when you are either standing or sitting on a firm arm-less chair.  Grasp an end of the band/tubing in each hand and have your palms face each other. Straighten your elbows and lift your hands straight in front of you at shoulder height. Step back away from the secured end of band/tubing until it becomes tense.  Squeeze your shoulder blades together. Keeping your elbows locked and your hands at shoulder-height, bring your hands out to your side.  Hold __________ seconds. Slowly ease the tension on the band/tubing as you reverse the directions and return to the starting position. Repeat __________ times. Complete this exercise __________ times per day. STRENGTH - Scapular Retractors  Secure a rubber exercise band/tubing so that it is at the height of your shoulders when you are either standing or sitting on a firm arm-less chair.  With a palm-down grip, grasp an end of the band/tubing in each hand. Straighten your elbows and lift your hands straight in front of you at shoulder height. Step back away from the secured end of band/tubing until it becomes tense.  Squeezing your shoulder blades together, draw your elbows back as you bend them. Keep your upper arm lifted away from your body throughout the exercise.  Hold __________ seconds. Slowly ease the tension on the band/tubing as you reverse the  directions and return to the starting position. Repeat __________ times. Complete this exercise __________ times per day. STRENGTH - Shoulder Extensors   Secure a rubber exercise band/tubing so that it is at the height of your shoulders when you are either standing or sitting on a firm arm-less chair.  With a thumbs-up grip, grasp an end of the band/tubing in each hand. Straighten your elbows and lift your hands straight in front of you at shoulder height. Step back away from the secured end of band/tubing until it becomes tense.  Squeezing your shoulder blades together, pull your hands down to the sides of your thighs. Do not allow your hands to go behind you.  Hold for __________ seconds. Slowly ease the tension on the band/tubing as you reverse the directions and return to the starting position. Repeat __________ times. Complete this exercise __________ times per day.  STRENGTH - Scapular Retractors and External Rotators  Secure a rubber exercise band/tubing so that it  is at the height of your shoulders when you are either standing or sitting on a firm arm-less chair.  With a palm-down grip, grasp an end of the band/tubing in each hand. Bend your elbows 90 degrees and lift your elbows to shoulder height at your sides. Step back away from the secured end of band/tubing until it becomes tense.  Squeezing your shoulder blades together, rotate your shoulder so that your upper arm and elbow remain stationary, but your fists travel upward to head-height.  Hold __________ for seconds. Slowly ease the tension on the band/tubing as you reverse the directions and return to the starting position. Repeat __________ times. Complete this exercise __________ times per day.  STRENGTH - Scapular Retractors and External Rotators, Rowing  Secure a rubber exercise band/tubing so that it is at the height of your shoulders when you are either standing or sitting on a firm arm-less chair.  With a palm-down  grip, grasp an end of the band/tubing in each hand. Straighten your elbows and lift your hands straight in front of you at shoulder height. Step back away from the secured end of band/tubing until it becomes tense.  Step 1: Squeeze your shoulder blades together. Bending your elbows, draw your hands to your chest as if you are rowing a boat. At the end of this motion, your hands and elbow should be at shoulder-height and your elbows should be out to your sides.  Step 2: Rotate your shoulder to raise your hands above your head. Your forearms should be vertical and your upper-arms should be horizontal.  Hold for __________ seconds. Slowly ease the tension on the band/tubing as you reverse the directions and return to the starting position. Repeat __________ times. Complete this exercise __________ times per day.  STRENGTH - Scapular Retractors and Elevators  Secure a rubber exercise band/tubing so that it is at the height of your shoulders when you are either standing or sitting on a firm arm-less chair.  With a thumbs-up grip, grasp an end of the band/tubing in each hand. Step back away from the secured end of band/tubing until it becomes tense.  Squeezing your shoulder blades together, straighten your elbows and lift your hands straight over your head.  Hold for __________ seconds. Slowly ease the tension on the band/tubing as you reverse the directions and return to the starting position. Repeat __________ times. Complete this exercise __________ times per day.  Document Released: 12/31/2004 Document Revised: 03/25/2011 Document Reviewed: 04/14/2008 [facility] Patient Information 2015 [place], [place]. This information is not intended to replace advice given to you by your health care provider. Make sure you discuss any questions you have with your health care provider.

## 2014-02-14 NOTE — Assessment & Plan Note (Signed)
Decision today to treat with OMT was based on Physical Exam  After verbal consent patient was treated with HVLA, ME techniques in cervical, thoracic and rib areas  Patient tolerated the procedure well with improvement in symptoms  Patient given exercises, stretches and lifestyle modifications  See medications in patient instructions if given  Patient will follow up in 3 weeks

## 2014-02-14 NOTE — Progress Notes (Signed)
  [name] [address] [place], [place] [address] Phone: [phone] Subjective:    CC: finger pain follow up  DXA:JOINOMVEHM [name] is a 54 y.o. female coming in with complaint of finger pain. Patient is no longer wearing the brace. Patient states that it is feeling much better. Patient states overall she is doing relatively well. Vision is not having any pain there.  Patient is having a new problem. States that it is more of a midthoracic pain on the right side. He rash. Denies any fever or chills. States that it seems to be intermittently. Worse after sitting at the computer for long amount of time. Rates the severity of 4 out of 10. Denies any injury. No nighttime awakenings.      Past medical history, social, surgical and family history all reviewed in electronic medical record.   Review of Systems: No headache, visual changes, nausea, vomiting, diarrhea, constipation, dizziness, abdominal pain, skin rash, fevers, chills, night sweats, weight loss, swollen lymph nodes, body aches, joint swelling, muscle aches, chest pain, shortness of breath, mood changes.   Objective Blood pressure 130/84, pulse 84, height 5' 0.25" (1.53 m), weight 149 lb (67.586 kg), SpO2 96 %.  General: No apparent distress alert and oriented x3 mood and affect normal, dressed appropriately.  HEENT: Pupils equal, extraocular movements intact  Respiratory: Patient's speak in full sentences and does not appear short of breath  Cardiovascular: No lower extremity edema, non tender, no erythema  Skin: Warm dry intact with no signs of infection or rash on extremities or on axial skeleton.  Abdomen: Soft nontender  Neuro: Cranial nerves II through XII are intact, neurovascularly intact in all extremities with 2+ DTRs and 2+ pulses.  Lymph: No lymphadenopathy of posterior or anterior cervical chain or axillae bilaterally.  Gait normal with good balance and coordination.  MSK:   Non tender with full range of motion and good stability and symmetric strength and tone of shoulders, elbows, wrist, hip, knee and ankles bilaterally.  Hand exam shows the patient does have callus formation noted over the radial aspect of the middle finger. Patient minimally tender in this area. Patient has full range of motion. No effusion  Osteopathic findings Cervical C4 flexed rotated and side bent right T5 extended rotated and side bent right with inhaled fifth rib    Impression and Recommendations:     This case required medical decision making of moderate complexity.

## 2014-02-14 NOTE — Assessment & Plan Note (Signed)
Cont current Rx 

## 2014-02-14 NOTE — Assessment & Plan Note (Signed)
Start Verapamil at hs w/caution

## 2014-02-14 NOTE — Progress Notes (Signed)
   Subjective:   C/o rapid HR and elevated BP. Her Bystolic is making her oversleep in am. She sets her alam for 4:30 am and would sleep late for another hr (unable to get up in spite of 2 alarm clocks going off... There are multiple side effects with other BP meds previously.   F/u elev Ca  F/u severe R hip pain. Much better now - in PT. Seing Dr Mayer Camel    HPI  F/u Neck pain - better F/u HTN, allergies  BP Readings from Last 3 Encounters:  02/14/14 142/90  02/14/14 130/84  01/19/14 140/86   Wt Readings from Last 3 Encounters:  02/14/14 150 lb (68.04 kg)  02/14/14 149 lb (67.586 kg)  01/19/14 151 lb (68.493 kg)     Review of Systems  Constitutional: Negative for chills, activity change, appetite change, fatigue and unexpected weight change.  HENT: Negative for congestion, mouth sores and sinus pressure.   Eyes: Negative for visual disturbance.  Respiratory: Negative for cough and chest tightness.   Gastrointestinal: Negative for nausea and abdominal pain.  Genitourinary: Negative for frequency, difficulty urinating and vaginal pain.  Musculoskeletal: Negative for back pain, gait problem, neck pain and neck stiffness.  Skin: Negative for pallor and rash.  Neurological: Negative for dizziness, tremors, weakness and headaches.  Psychiatric/Behavioral: Negative for suicidal ideas, confusion and sleep disturbance.       Objective:   Physical Exam  Constitutional: She appears well-developed. No distress.  HENT:  Head: Normocephalic.  Right Ear: External ear normal.  Left Ear: External ear normal.  Nose: Nose normal.  Mouth/Throat: Oropharynx is clear and moist.  Eyes: Conjunctivae are normal. Pupils are equal, round, and reactive to light. Right eye exhibits no discharge. Left eye exhibits no discharge.  Neck: Normal range of motion. Neck supple. No JVD present. No tracheal deviation present. No thyromegaly present.  Cardiovascular: Normal rate, regular rhythm and normal  heart sounds.   Pulmonary/Chest: No stridor. No respiratory distress. She has no wheezes.  Abdominal: Soft. Bowel sounds are normal. She exhibits no distension and no mass. There is no tenderness. There is no rebound and no guarding.  Musculoskeletal: She exhibits tenderness. She exhibits no edema.  Lymphadenopathy:    She has no cervical adenopathy.  Neurological: She displays normal reflexes. No cranial nerve deficit. She exhibits normal muscle tone. Coordination normal.  Skin: No rash noted. No erythema.  Psychiatric: She has a normal mood and affect. Her behavior is normal. Judgment and thought content normal.  R 3d finger PIP is less swollen and tender   Lab Results  Component Value Date   WBC 9.3 12/28/2013   HGB 14.2 12/28/2013   HCT 42.7 12/28/2013   PLT 320.0 12/28/2013   GLUCOSE 91 12/28/2013   CHOL 150 01/25/2013   TRIG 55.0 01/25/2013   HDL 38.90* 01/25/2013   LDLCALC 100* 01/25/2013   ALT 27 12/28/2013   AST 28 12/28/2013   NA 135 12/28/2013   K 3.9 12/28/2013   CL 103 12/28/2013   CREATININE 1.1 12/28/2013   BUN 15 12/28/2013   CO2 26 12/28/2013   TSH 1.64 09/10/2013   HGBA1C 5.9 05/18/2013          Assessment & Plan:  Patient ID: [name], female   DOB: 11-Aug-1960, 54 y.o.   MRN: [id number]

## 2014-02-14 NOTE — Assessment & Plan Note (Signed)
Patient states that overall she did respond very well to osteopathic manipulation. Patient states that the exercises would be helpful. Patient did go over these with the athletic trainer today. Patient did respond very well to the manipulation as well today. We discussed continuing over-the-counter Edison's and we discussed ergonomic changes patient can make at work. Patient will make these changes back and see me again in 3 weeks for further evaluation and treatment.

## 2014-02-16 ENCOUNTER — Ambulatory Visit (INDEPENDENT_AMBULATORY_CARE_PROVIDER_SITE_OTHER): Payer: 59 | Admitting: Critical Care Medicine

## 2014-02-16 ENCOUNTER — Encounter: Payer: Self-pay | Admitting: Critical Care Medicine

## 2014-02-16 VITALS — BP 124/82 | HR 91 | Temp 98.1°F | Ht 60.25 in | Wt 149.0 lb

## 2014-02-16 DIAGNOSIS — J4540 Moderate persistent asthma, uncomplicated: Secondary | ICD-10-CM

## 2014-02-16 NOTE — Progress Notes (Signed)
Subjective:    Patient ID: [name], female    DOB: April 20, 1960, 54 y.o.   MRN: [id number]  HPI   02/16/2014 Chief Complaint  Patient presents with  . 4 month follow up    DOE unchanged.  Occas nonprod cough.  No chest tightness/CP or wheezing.  Pt notes no change in dyspnea.  Only mild DOE with heavy exertion.  No chest pain or tightness. Pt denies any significant sore throat, nasal congestion or excess secretions, fever, chills, sweats, unintended weight loss, pleurtic or exertional chest pain, orthopnea PND, or leg swelling Pt denies any increase in rescue therapy over baseline, denies waking up needing it or having any early am or nocturnal exacerbations of coughing/wheezing/or dyspnea. Pt also denies any obvious fluctuation in symptoms with  weather or environmental change or other alleviating or aggravating factors   PUL ASTHMA HISTORY 02/16/2014 09/22/2013 09/12/2010  Symptoms 0-2 days/week 0-2 days/week 0-2 days/week  Nighttime awakenings 0-2/month 0-2/month 0-2/month  Interference with activity No limitations No limitations No limitations  SABA use 0-2 days/wk 0-2 days/wk 0-2 days/wk  Exacerbations requiring oral steroids 0-1 / year 0-1 / year 0-1 / year      Review of Systems Constitutional:   No  weight loss, night sweats,  Fevers, chills, fatigue, lassitude. HEENT:   No headaches,  Difficulty swallowing,  Tooth/dental problems,  Sore throat,                No sneezing, itching, ear ache, nasal congestion, post nasal drip,   CV:  No chest pain,  Orthopnea, PND, swelling in lower extremities, anasarca, dizziness, palpitations  GI  No heartburn, indigestion, abdominal pain, nausea, vomiting, diarrhea, change in bowel habits, loss of appetite  Resp: No shortness of breath with exertion not at rest.  No excess mucus, no productive cough,  No non-productive cough,  No coughing up of blood.  No change in color of mucus.  No wheezing.  No chest wall deformity  Skin: no  rash or lesions.  GU: no dysuria, change in color of urine, no urgency or frequency.  No flank pain.  MS:  No joint pain or swelling.  No decreased range of motion.  No back pain.  Psych:  No change in mood or affect. No depression or anxiety.  No memory loss.     Objective:   Physical Exam  Filed Vitals:   02/16/14 1110  BP: 124/82  Pulse: 91  Temp: 98.1 F (36.7 C)  TempSrc: Oral  Height: 5' 0.25" (1.53 m)  Weight: 149 lb (67.586 kg)  SpO2: 97%    Gen: Pleasant, well-nourished, in no distress,  normal affect  ENT: No lesions,  mouth clear,  oropharynx clear, no postnasal drip  Neck: No JVD, no TMG, no carotid bruits  Lungs: No use of accessory muscles, no dullness to percussion, clear without rales or rhonchi  Cardiovascular: RRR, heart sounds normal, no murmur or gallops, no peripheral edema  Abdomen: soft and NT, no HSM,  BS normal  Musculoskeletal: No deformities, no cyanosis or clubbing  Neuro: alert, non focal  Skin: Warm, no lesions or rashes  No results found.       Assessment & Plan:   Asthma, moderate persistent Moderate persistent asthma stable at present Pt admits to many life stressors Plan No change in inhaled or maintenance medications. Return in  6 months     Updated Medication List Outpatient Encounter Prescriptions as of 02/16/2014  Medication Sig  . acyclovir (  ZOVIRAX) 5 % cream Apply topically as needed.    . Cholecalciferol (VITAMIN D3) 2000 UNITS capsule Take 2,000 Units by mouth daily.  . Cyanocobalamin (VITAMIN B-12) 5000 MCG LOZG Take 2 lozenges by mouth daily.  . cyanocobalamin 500 MCG tablet Take 500 mcg by mouth at bedtime.  . Dorzolamide HCl-Timolol Mal (COSOPT OP) 1 drop once daily in left eye  . fluticasone (FLONASE) 50 MCG/ACT nasal spray Place 2 sprays into the nose daily as needed.   . Hypertonic Nasal Wash (NASAFLO NETI POT NASAL WASH NA) Place into the nose as needed.   [name] KLOR-CON/EF 25 MEQ disintegrating tablet  DISSOLVE ONE TABLET IN THE MOUTH TWICE A DAY  . losartan (COZAAR) 100 MG tablet TAKE 1 TABLET BY MOUTH ONCE A DAY  . mometasone (ASMANEX 60 METERED DOSES) 220 MCG/INH inhaler Inhale 2 puffs into the lungs daily.  . Multiple Vitamins-Minerals (HAIR/SKIN/NAILS) TABS Take by mouth 2 (two) times daily.   . valACYclovir (VALTREX) 1000 MG tablet 1/2 tablet two times a day  . verapamil (VERELAN PM) 180 MG 24 hr capsule Take 1 capsule (180 mg total) by mouth at bedtime.  . clonazePAM (KLONOPIN) 0.5 MG tablet Take 1 tablet by mouth 3 (three) times daily as needed.

## 2014-02-16 NOTE — Patient Instructions (Signed)
No change in medications. Return in        6 months        

## 2014-02-17 NOTE — Assessment & Plan Note (Signed)
Moderate persistent asthma stable at present Pt admits to many life stressors Plan No change in inhaled or maintenance medications. Return in  6 months

## 2014-02-18 ENCOUNTER — Telehealth: Payer: Self-pay | Admitting: Internal Medicine

## 2014-02-18 NOTE — Telephone Encounter (Signed)
Pt called stated she drop off FMLA paper work as directed by the doctor (just want to let Dr. Camila Li know). Pt was wondering if this can be done ASAP and call pt

## 2014-02-18 NOTE — Telephone Encounter (Signed)
I haven't seen it yet Thx

## 2014-02-22 DIAGNOSIS — Z0279 Encounter for issue of other medical certificate: Secondary | ICD-10-CM

## 2014-03-02 NOTE — Telephone Encounter (Signed)
Pt called to check up on the paper work and want to know where does this faxed to? Please call pt

## 2014-03-04 ENCOUNTER — Ambulatory Visit: Payer: 59 | Admitting: Family Medicine

## 2014-03-09 ENCOUNTER — Telehealth: Payer: Self-pay | Admitting: *Deleted

## 2014-03-09 MED ORDER — NEBIVOLOL HCL 20 MG PO TABS: ORAL_TABLET | ORAL | Status: DC

## 2014-03-09 NOTE — Telephone Encounter (Signed)
OK. Thx

## 2014-03-09 NOTE — Telephone Encounter (Signed)
Left msg on triage stating she would like to go back on Bystolic. The verapamil makes her sleepy and she can't function wanting to go back on bystolic...Johny Chess

## 2014-03-09 NOTE — Telephone Encounter (Signed)
Notified pt with md response.../lmb 

## 2014-03-20 ENCOUNTER — Other Ambulatory Visit: Payer: Self-pay | Admitting: Internal Medicine

## 2014-03-20 DIAGNOSIS — I10 Essential (primary) hypertension: Secondary | ICD-10-CM

## 2014-03-28 ENCOUNTER — Ambulatory Visit (INDEPENDENT_AMBULATORY_CARE_PROVIDER_SITE_OTHER): Payer: 59 | Admitting: Critical Care Medicine

## 2014-03-28 ENCOUNTER — Other Ambulatory Visit: Payer: Self-pay | Admitting: *Deleted

## 2014-03-28 ENCOUNTER — Encounter: Payer: Self-pay | Admitting: Critical Care Medicine

## 2014-03-28 VITALS — BP 124/82 | HR 68 | Temp 97.0°F | Ht 60.75 in | Wt 147.2 lb

## 2014-03-28 DIAGNOSIS — J0191 Acute recurrent sinusitis, unspecified: Secondary | ICD-10-CM

## 2014-03-28 DIAGNOSIS — J4541 Moderate persistent asthma with (acute) exacerbation: Secondary | ICD-10-CM

## 2014-03-28 DIAGNOSIS — R059 Cough, unspecified: Secondary | ICD-10-CM

## 2014-03-28 DIAGNOSIS — R05 Cough: Secondary | ICD-10-CM

## 2014-03-28 MED ORDER — AZITHROMYCIN 250 MG PO TABS: ORAL_TABLET | ORAL | Status: DC

## 2014-03-28 MED ORDER — METHYLPREDNISOLONE ACETATE 80 MG/ML IJ SUSP
120.0000 mg | Freq: Once | INTRAMUSCULAR | Status: AC
  Administered 2014-03-28: 120 mg via INTRAMUSCULAR

## 2014-03-28 NOTE — Patient Instructions (Signed)
A depomedrol 120mg  injection was given Azithromycin sent to pharmacy Use delsym OTC for cough Use 1-2 puff flonase ea nostril and sinus rinse daily Stay on asmanex Return 3 months

## 2014-03-28 NOTE — Assessment & Plan Note (Signed)
Moderate persistent asthma with exacerbation d/t sinusitis acute Plan A depomedrol 120mg  injection was given Azithromycin sent to pharmacy Use delsym OTC for cough Use 1-2 puff flonase ea nostril and sinus rinse daily Stay on asmanex Return 3 months

## 2014-03-28 NOTE — Progress Notes (Signed)
Subjective:    Patient ID: [name], female    DOB: 1960/11/21, 54 y.o.   MRN: [id number]  HPI 03/28/2014 Chief Complaint  Patient presents with  . Acute Visit    non-productive cough; no chest tightness  Chronic cough x 1 week,  Prior to this had a fever, 101.  No aches.  Cough was later.  No throat issues, did have some nasal drip. No real change in dyspnea.  Pt notes still with nasal issues.  mucinex not helping.  Cough occurs once an hour.  No real chest pain . Pt denies any significant sore throat, nasal congestion or excess secretions, fever, chills, sweats, unintended weight loss, pleurtic or exertional chest pain, orthopnea PND, or leg swelling Pt denies any increase in rescue therapy over baseline, denies waking up needing it or having any early am or nocturnal exacerbations of coughing/wheezing/or dyspnea. Pt also denies any obvious fluctuation in symptoms with  weather or environmental change or other alleviating or aggravating factors    PUL ASTHMA HISTORY 03/28/2014 02/16/2014 09/22/2013 09/12/2010  Symptoms Daily 0-2 days/week 0-2 days/week 0-2 days/week  Nighttime awakenings >1/wk but not nightly 0-2/month 0-2/month 0-2/month  Interference with activity Some limitations No limitations No limitations No limitations  SABA use > 2 days/wk--not > 1 x/day 0-2 days/wk 0-2 days/wk 0-2 days/wk  Exacerbations requiring oral steroids 0-1 / year 0-1 / year 0-1 / year 0-1 / year    Review of Systems Constitutional:   No  weight loss, night sweats,  Fevers, chills, fatigue, lassitude. HEENT:   No headaches,  Difficulty swallowing,  Tooth/dental problems,  Sore throat,                No sneezing, itching, ear ache, nasal congestion, post nasal drip,   CV:  No chest pain,  Orthopnea, PND, swelling in lower extremities, anasarca, dizziness, palpitations  GI  No heartburn, indigestion, abdominal pain, nausea, vomiting, diarrhea, change in bowel habits, loss of appetite  Resp: Notes  shortness of breath with exertion not at rest.  No excess mucus, notes  productive cough,  No non-productive cough,  No coughing up of blood.  No change in color of mucus.  No wheezing.  No chest wall deformity  Skin: no rash or lesions.  GU: no dysuria, change in color of urine, no urgency or frequency.  No flank pain.  MS:  No joint pain or swelling.  No decreased range of motion.  No back pain.  Psych:  No change in mood or affect. No depression or anxiety.  No memory loss.     Objective:   Physical Exam  Filed Vitals:   03/28/14 0908  BP: 124/82  Pulse: 68  Temp: 97 F (36.1 C)  TempSrc: Oral  Height: 5' 0.75" (1.543 m)  Weight: 147 lb 3.2 oz (66.769 kg)  SpO2: 98%    Gen: Pleasant, well-nourished, in no distress,  normal affect  ENT: No lesions,  mouth clear,  oropharynx clear, +++ postnasal drip, bilateral nasal turbinate edema and pururlence L>R  Neck: No JVD, no TMG, no carotid bruits  Lungs: No use of accessory muscles, no dullness to percussion, clear without rales or rhonchi  Cardiovascular: RRR, heart sounds normal, no murmur or gallops, no peripheral edema  Abdomen: soft and NT, no HSM,  BS normal  Musculoskeletal: No deformities, no cyanosis or clubbing  Neuro: alert, non focal  Skin: Warm, no lesions or rashes  No results found.  Assessment & Plan:   Asthma, moderate persistent Moderate persistent asthma with exacerbation d/t sinusitis acute Plan A depomedrol 120mg  injection was given Azithromycin sent to pharmacy Use delsym OTC for cough Use 1-2 puff flonase ea nostril and sinus rinse daily Stay on asmanex Return 3 months      Updated Medication List Outpatient Encounter Prescriptions as of 03/28/2014  Medication Sig  . acyclovir (ZOVIRAX) 5 % cream Apply topically as needed.    . Cholecalciferol (VITAMIN D3) 2000 UNITS capsule Take 2,000 Units by mouth daily.  . Cyanocobalamin (VITAMIN B-12) 5000 MCG LOZG Take 2 lozenges by  mouth daily.  . cyanocobalamin 500 MCG tablet Take 500 mcg by mouth at bedtime.  . Dorzolamide HCl-Timolol Mal (COSOPT OP) 1 drop once daily in left eye  . fluticasone (FLONASE) 50 MCG/ACT nasal spray Place 2 sprays into the nose daily as needed.   . Hypertonic Nasal Wash (NASAFLO NETI POT NASAL WASH NA) Place into the nose as needed.   [name] KLOR-CON/EF 25 MEQ disintegrating tablet DISSOLVE ONE TABLET IN THE MOUTH TWICE A DAY  . losartan (COZAAR) 100 MG tablet TAKE 1 TABLET BY MOUTH ONCE A DAY  . mometasone (ASMANEX 60 METERED DOSES) 220 MCG/INH inhaler Inhale 2 puffs into the lungs daily.  . Multiple Vitamins-Minerals (HAIR/SKIN/NAILS) TABS Take by mouth 2 (two) times daily.   . Nebivolol HCl (BYSTOLIC) 20 MG TABS TAKE 2 TABLETS BY MOUTH DAILY AS DIRECTED  . valACYclovir (VALTREX) 1000 MG tablet 1/2 tablet two times a day  . azithromycin (ZITHROMAX) 250 MG tablet Take two once then one daily until gone  . [DISCONTINUED] clonazePAM (KLONOPIN) 0.5 MG tablet Take 1 tablet by mouth 3 (three) times daily as needed.  . [EXPIRED] methylPREDNISolone acetate (DEPO-MEDROL) injection 120 mg

## 2014-04-12 ENCOUNTER — Ambulatory Visit: Payer: 59 | Admitting: Internal Medicine

## 2014-04-18 ENCOUNTER — Other Ambulatory Visit: Payer: 59

## 2014-04-18 ENCOUNTER — Telehealth: Payer: Self-pay | Admitting: Critical Care Medicine

## 2014-04-18 ENCOUNTER — Encounter: Payer: Self-pay | Admitting: Critical Care Medicine

## 2014-04-18 ENCOUNTER — Ambulatory Visit (INDEPENDENT_AMBULATORY_CARE_PROVIDER_SITE_OTHER): Payer: 59 | Admitting: Critical Care Medicine

## 2014-04-18 ENCOUNTER — Ambulatory Visit (INDEPENDENT_AMBULATORY_CARE_PROVIDER_SITE_OTHER)
Admission: RE | Admit: 2014-04-18 | Discharge: 2014-04-18 | Disposition: A | Discharge status: Home / Self Care | Payer: 59 | Source: Ambulatory Visit | Attending: Critical Care Medicine | Admitting: Critical Care Medicine

## 2014-04-18 VITALS — BP 164/98 | HR 67 | Temp 98.8°F | Ht 60.25 in | Wt 145.2 lb

## 2014-04-18 DIAGNOSIS — J189 Pneumonia, unspecified organism: Secondary | ICD-10-CM | POA: Diagnosis not present

## 2014-04-18 DIAGNOSIS — D869 Sarcoidosis, unspecified: Secondary | ICD-10-CM | POA: Diagnosis not present

## 2014-04-18 DIAGNOSIS — J209 Acute bronchitis, unspecified: Secondary | ICD-10-CM

## 2014-04-18 MED ORDER — FLUCONAZOLE 100 MG PO TABS: ORAL_TABLET | ORAL | Status: DC

## 2014-04-18 MED ORDER — AZITHROMYCIN 250 MG PO TABS: ORAL_TABLET | ORAL | Status: DC

## 2014-04-18 MED ORDER — LEVOFLOXACIN 500 MG PO TABS: 500.0000 mg | ORAL_TABLET | Freq: Every day | ORAL | Status: DC

## 2014-04-18 NOTE — Telephone Encounter (Signed)
Pt has PNA on CXR, called in levaquin and told pt to Stop azithromycin

## 2014-04-18 NOTE — Patient Instructions (Signed)
Azithromycin 250mg  Take two once then one daily until gone No other changes Obtain a chest xray Return 2 months

## 2014-04-18 NOTE — Progress Notes (Signed)
Subjective:    Patient ID: [name], female    DOB: 03/27/1960, 54 y.o.   MRN: [id number]  HPI   04/18/2014 Chief Complaint  Patient presents with  . Acute Visit    sore throat, PND, and cough with clear to light green mucus.  Symptoms started x 3 days ago.  No increased SOB, chest tightness/CP, or f/c/s.    Pt ill since 4 days ago.  Pt with sore throat, cough green mucus started 1 day ago.  No real fever.  No chest pain. No wheeze, no edema in feet, notes some pndrip .  Using nedi pot.   Pt denies any significant sore throat, nasal congestion or excess secretions, fever, chills, sweats, unintended weight loss, pleurtic or exertional chest pain, orthopnea PND, or leg swelling Pt denies any increase in rescue therapy over baseline, denies waking up needing it or having any early am or nocturnal exacerbations of coughing/wheezing/or dyspnea. Pt also denies any obvious fluctuation in symptoms with  weather or environmental change or other alleviating or aggravating factors      PUL ASTHMA HISTORY 03/28/2014 02/16/2014 09/22/2013 09/12/2010  Symptoms Daily 0-2 days/week 0-2 days/week 0-2 days/week  Nighttime awakenings >1/wk but not nightly 0-2/month 0-2/month 0-2/month  Interference with activity Some limitations No limitations No limitations No limitations  SABA use > 2 days/wk--not > 1 x/day 0-2 days/wk 0-2 days/wk 0-2 days/wk  Exacerbations requiring oral steroids 0-1 / year 0-1 / year 0-1 / year 0-1 / year    Review of Systems Constitutional:   No  weight loss, night sweats,  Fevers, chills, fatigue, lassitude. HEENT:   No headaches,  Difficulty swallowing,  Tooth/dental problems,  Sore throat,                No sneezing, itching, ear ache, nasal congestion, post nasal drip,   CV:  No chest pain,  Orthopnea, PND, swelling in lower extremities, anasarca, dizziness, palpitations  GI  No heartburn, indigestion, abdominal pain, nausea, vomiting, diarrhea, change in bowel habits,  loss of appetite  Resp: Notes shortness of breath with exertion not at rest.  No excess mucus, notes  productive cough,  No non-productive cough,  No coughing up of blood.  No change in color of mucus.  No wheezing.  No chest wall deformity  Skin: no rash or lesions.  GU: no dysuria, change in color of urine, no urgency or frequency.  No flank pain.  MS:  No joint pain or swelling.  No decreased range of motion.  No back pain.  Psych:  No change in mood or affect. No depression or anxiety.  No memory loss.     Objective:   Physical Exam  Filed Vitals:   04/18/14 1130  BP: 164/98  Pulse: 67  Temp: 98.8 F (37.1 C)  TempSrc: Oral  Height: 5' 0.25" (1.53 m)  Weight: 145 lb 3.2 oz (65.862 kg)  SpO2: 97%    Gen: Pleasant, well-nourished, in no distress,  normal affect  ENT: No lesions,  mouth clear,  oropharynx clear, +++ postnasal drip, bilateral nasal turbinate edema and pururlence L>R  Neck: No JVD, no TMG, no carotid bruits  Lungs: No use of accessory muscles, no dullness to percussion, bilateral rales  Cardiovascular: RRR, heart sounds normal, no murmur or gallops, no peripheral edema  Abdomen: soft and NT, no HSM,  BS normal  Musculoskeletal: No deformities, no cyanosis or clubbing  Neuro: alert, non focal  Skin: Warm, no lesions or rashes  Dg Chest 2 View  04/18/2014   CLINICAL DATA:  Cough.  Sarcoid.  EXAM: CHEST  2 VIEW  COMPARISON:  03/18/2013.  FINDINGS: Mediastinum and hilar structures are normal . Patchy bilateral pulmonary infiltrates are noted in both upper lobes. No pleural effusion or pneumothorax. No acute bony abnormality .  IMPRESSION: 1. Development of patchy pulmonary infiltrates in both upper lobes. Bilateral pneumonia could present in this fashion. Progressive sarcoidosis with pulmonary involvement could present in this fashion.  2. Cardiomegaly .   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 acyclovir (ZOVIRAX) 5 % cream Apply topically as needed.    . Cholecalciferol (VITAMIN D3) 2000 UNITS capsule Take 2,000 Units by mouth daily.  . Dorzolamide HCl-Timolol Mal (COSOPT OP) 1 drop once daily in left eye  . fluticasone (FLONASE) 50 MCG/ACT nasal spray Place 2 sprays into the nose daily.   . Hypertonic Nasal Wash (NASAFLO NETI POT NASAL WASH NA) Place into the nose daily.   [name] KLOR-CON/EF 25 MEQ disintegrating tablet DISSOLVE ONE TABLET IN THE MOUTH TWICE A DAY  . losartan (COZAAR) 100 MG tablet TAKE 1 TABLET BY MOUTH ONCE A DAY  . Multiple Vitamins-Minerals (HAIR/SKIN/NAILS) TABS Take by mouth 2 (two) times daily.   . Nebivolol HCl (BYSTOLIC) 20 MG TABS TAKE 2 TABLETS BY MOUTH DAILY AS DIRECTED  . valACYclovir (VALTREX) 1000 MG tablet 1/2 tablet two times a day  . mometasone (ASMANEX 60 METERED DOSES) 220 MCG/INH inhaler Inhale 2 puffs into the lungs daily. (Patient not taking: Reported on 04/18/2014)  . [DISCONTINUED] azithromycin (ZITHROMAX) 250 MG tablet Take two once then one daily until gone (Patient not taking: Reported on 04/18/2014)  . [DISCONTINUED] azithromycin (ZITHROMAX) 250 MG tablet Take two once then one daily until gone  . [DISCONTINUED] Cyanocobalamin (VITAMIN B-12) 5000 MCG LOZG Take 2 lozenges by mouth daily.  . [DISCONTINUED] cyanocobalamin 500 MCG tablet Take 500 mcg by mouth at bedtime.

## 2014-04-19 ENCOUNTER — Telehealth: Payer: Self-pay | Admitting: Critical Care Medicine

## 2014-04-19 DIAGNOSIS — J189 Pneumonia, unspecified organism: Secondary | ICD-10-CM | POA: Insufficient documentation

## 2014-04-19 MED ORDER — PREDNISONE 20 MG PO TABS: ORAL_TABLET | ORAL | Status: DC

## 2014-04-19 NOTE — Assessment & Plan Note (Signed)
CAP bilateral upper lobe , acute Plan 7days levaquin 500mg  daily rov soon

## 2014-04-19 NOTE — Telephone Encounter (Signed)
Per SN: it's important to take levaquin until completion for the pna.  Call in pred 20mg  # 20 1 po bid X4 days, 1 po qd X4 days, then .5 tab qd until gone.   Pt aware of recs.  Send pred to pharmacy.  Nothing further needed.

## 2014-04-19 NOTE — Assessment & Plan Note (Signed)
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

## 2014-04-19 NOTE — Telephone Encounter (Signed)
Spoke with pt, states that the levaquin is making her cough a lot and wants to know if this is normal .  Pt states that she was taking azithromycin just yesterday before being switched to Levaquin after imaging showed pneumonia.   Has a prod cough with clear mucus tinged with yellow.  Denies fever, chest tightness.    Sending to doc of day in PW's absence.  Dr. Lenna Gilford please advise if this is normal.  Thanks!  Allergies  Allergen Reactions  . Amoxicillin-Pot Clavulanate     REACTION: GI symptoms  . Cardizem [Diltiazem Hcl]     HAs  . Carvedilol     REACTION: tired  . Cefdinir   . Cephalexin     REACTION: unspecified  . Hydrochlorothiazide     REACTION: unspecified  . Influenza Vaccines     Allergic to flu vaccinations per pt.  Soreness and "cold symptoms" per pt  . Sulfamethoxazole     REACTION: unspecified  . Sulfonamide Derivatives   . Verapamil     fatigue

## 2014-04-20 ENCOUNTER — Telehealth: Payer: Self-pay | Admitting: Critical Care Medicine

## 2014-04-20 NOTE — Telephone Encounter (Signed)
Spoke with pt, aware of PW's recs.  Nothing further needed.

## 2014-04-20 NOTE — Telephone Encounter (Signed)
It will be safe to order prednisone

## 2014-04-20 NOTE — Telephone Encounter (Signed)
Spoke with pt, states she was given a steroid injection on 3/14 and has had multiple bone fractures in the past.  Pt is concerned about taking the prednisone taper given by SN yesterday to improve her coughing while on levaquin.  See phone note from yesterday. Pt states she bought delsym yesterday while at pharmacy and it has improved her coughing since yesterday.    Dr. Joya Gaskins are you ok with pt taking the pred taper prescribed (20mg  #20, 1 tab bid X4 days, then 1 tab qd X4 days, then .5 tab qd until gone) or do you prefer her to continue using the delsym and not taking the prednisone?  Pt has not yet started the taper.  Thanks!

## 2014-04-21 ENCOUNTER — Ambulatory Visit (INDEPENDENT_AMBULATORY_CARE_PROVIDER_SITE_OTHER): Payer: 59 | Admitting: Internal Medicine

## 2014-04-21 ENCOUNTER — Ambulatory Visit: Payer: 59 | Admitting: Internal Medicine

## 2014-04-21 ENCOUNTER — Encounter: Payer: Self-pay | Admitting: Internal Medicine

## 2014-04-21 ENCOUNTER — Other Ambulatory Visit (INDEPENDENT_AMBULATORY_CARE_PROVIDER_SITE_OTHER): Payer: 59

## 2014-04-21 VITALS — BP 194/120 | HR 72 | Wt 144.0 lb

## 2014-04-21 DIAGNOSIS — F418 Other specified anxiety disorders: Secondary | ICD-10-CM

## 2014-04-21 DIAGNOSIS — I10 Essential (primary) hypertension: Secondary | ICD-10-CM

## 2014-04-21 DIAGNOSIS — E538 Deficiency of other specified B group vitamins: Secondary | ICD-10-CM

## 2014-04-21 DIAGNOSIS — J189 Pneumonia, unspecified organism: Secondary | ICD-10-CM

## 2014-04-21 LAB — VITAMIN B12

## 2014-04-21 LAB — TSH: TSH: 1.25 u[IU]/mL (ref 0.35–4.50)

## 2014-04-21 LAB — BASIC METABOLIC PANEL
BUN: 24 mg/dL — ABNORMAL HIGH (ref 6–23)
CHLORIDE: 102 meq/L (ref 96–112)
CO2: 30 mEq/L (ref 19–32)
CREATININE: 1.34 mg/dL — AB (ref 0.40–1.20)
Calcium: 10.6 mg/dL — ABNORMAL HIGH (ref 8.4–10.5)
GFR: 52.86 mL/min — ABNORMAL LOW (ref >60.00)
Glucose, Bld: 133 mg/dL — ABNORMAL HIGH (ref 70–99)
Potassium: 4.5 mEq/L (ref 3.5–5.1)
SODIUM: 138 meq/L (ref 135–145)

## 2014-04-21 LAB — HEPATIC FUNCTION PANEL
ALBUMIN: 4.1 g/dL (ref 3.5–5.2)
ALT: 30 U/L (ref 0–35)
AST: 24 U/L (ref 0–37)
Alkaline Phosphatase: 99 U/L (ref 39–117)
BILIRUBIN DIRECT: 0.1 mg/dL (ref 0.0–0.3)
TOTAL PROTEIN: 8.2 g/dL (ref 6.0–8.3)
Total Bilirubin: 0.5 mg/dL (ref 0.2–1.2)

## 2014-04-21 NOTE — Progress Notes (Signed)
Pre visit review using our clinic review tool, if applicable. No additional management support is needed unless otherwise documented below in the visit note. 

## 2014-04-21 NOTE — Assessment & Plan Note (Signed)
Labs

## 2014-04-21 NOTE — Assessment & Plan Note (Signed)
Finish Levaquin

## 2014-04-21 NOTE — Assessment & Plan Note (Signed)
Less stressed

## 2014-04-21 NOTE — Assessment & Plan Note (Signed)
Bystolic - restart Losartan

## 2014-04-21 NOTE — Progress Notes (Signed)
   Subjective:   F/u CAP - Dr Joya Gaskins (4/16) - better on Levaquin/Predn. Pt retired 04/15/14.  F/u rapid HR and elevated BP. BP is OK at home  F/u elev Ca++  F/u severe R hip pain. Much better now - in PT. Seing Dr Mayer Camel    HPI  F/u Neck pain - better F/u HTN, allergies  BP Readings from Last 3 Encounters:  04/21/14 194/120  04/18/14 164/98  03/28/14 124/82   Wt Readings from Last 3 Encounters:  04/21/14 144 lb (65.318 kg)  04/18/14 145 lb 3.2 oz (65.862 kg)  03/28/14 147 lb 3.2 oz (66.769 kg)     Review of Systems  Constitutional: Negative for chills, activity change, appetite change, fatigue and unexpected weight change.  HENT: Negative for congestion, mouth sores and sinus pressure.   Eyes: Negative for visual disturbance.  Respiratory: Negative for cough and chest tightness.   Gastrointestinal: Negative for nausea and abdominal pain.  Genitourinary: Negative for frequency, difficulty urinating and vaginal pain.  Musculoskeletal: Negative for back pain, gait problem, neck pain and neck stiffness.  Skin: Negative for pallor and rash.  Neurological: Negative for dizziness, tremors, weakness and headaches.  Psychiatric/Behavioral: Negative for suicidal ideas, confusion and sleep disturbance.       Objective:   Physical Exam  Constitutional: She appears well-developed. No distress.  HENT:  Head: Normocephalic.  Right Ear: External ear normal.  Left Ear: External ear normal.  Nose: Nose normal.  Mouth/Throat: Oropharynx is clear and moist.  Eyes: Conjunctivae are normal. Pupils are equal, round, and reactive to light. Right eye exhibits no discharge. Left eye exhibits no discharge.  Neck: Normal range of motion. Neck supple. No JVD present. No tracheal deviation present. No thyromegaly present.  Cardiovascular: Normal rate, regular rhythm and normal heart sounds.   Pulmonary/Chest: No stridor. No respiratory distress. She has no wheezes.  Abdominal: Soft. Bowel  sounds are normal. She exhibits no distension and no mass. There is no tenderness. There is no rebound and no guarding.  Musculoskeletal: She exhibits tenderness. She exhibits no edema.  Lymphadenopathy:    She has no cervical adenopathy.  Neurological: She displays normal reflexes. No cranial nerve deficit. She exhibits normal muscle tone. Coordination normal.  Skin: No rash noted. No erythema.  Psychiatric: She has a normal mood and affect. Her behavior is normal. Judgment and thought content normal.    Lab Results  Component Value Date   WBC 9.3 12/28/2013   HGB 14.2 12/28/2013   HCT 42.7 12/28/2013   PLT 320.0 12/28/2013   GLUCOSE 91 12/28/2013   CHOL 150 01/25/2013   TRIG 55.0 01/25/2013   HDL 38.90* 01/25/2013   LDLCALC 100* 01/25/2013   ALT 27 12/28/2013   AST 28 12/28/2013   NA 135 12/28/2013   K 3.9 12/28/2013   CL 103 12/28/2013   CREATININE 1.1 12/28/2013   BUN 15 12/28/2013   CO2 26 12/28/2013   TSH 1.64 09/10/2013   HGBA1C 5.9 05/18/2013          Assessment & Plan:

## 2014-04-25 ENCOUNTER — Telehealth: Payer: Self-pay | Admitting: Internal Medicine

## 2014-04-25 NOTE — Telephone Encounter (Signed)
Pt called in and would like a call back with her lab results on 4/7  Best number [phone]

## 2014-04-25 NOTE — Telephone Encounter (Signed)
Called and gave lab values and mailed lab results per pt request

## 2014-04-28 ENCOUNTER — Ambulatory Visit: Payer: 59 | Admitting: Internal Medicine

## 2014-05-10 ENCOUNTER — Telehealth: Payer: Self-pay | Admitting: Internal Medicine

## 2014-05-19 ENCOUNTER — Ambulatory Visit: Payer: 59 | Admitting: Internal Medicine

## 2014-05-31 ENCOUNTER — Encounter: Payer: Self-pay | Admitting: Internal Medicine

## 2014-05-31 ENCOUNTER — Ambulatory Visit (INDEPENDENT_AMBULATORY_CARE_PROVIDER_SITE_OTHER): Payer: 59 | Admitting: Internal Medicine

## 2014-05-31 VITALS — BP 138/96 | HR 68 | Wt 145.0 lb

## 2014-05-31 DIAGNOSIS — E538 Deficiency of other specified B group vitamins: Secondary | ICD-10-CM

## 2014-05-31 DIAGNOSIS — J4540 Moderate persistent asthma, uncomplicated: Secondary | ICD-10-CM

## 2014-05-31 DIAGNOSIS — R195 Other fecal abnormalities: Secondary | ICD-10-CM

## 2014-05-31 DIAGNOSIS — I10 Essential (primary) hypertension: Secondary | ICD-10-CM | POA: Diagnosis not present

## 2014-05-31 DIAGNOSIS — J301 Allergic rhinitis due to pollen: Secondary | ICD-10-CM | POA: Diagnosis not present

## 2014-05-31 DIAGNOSIS — H409 Unspecified glaucoma: Secondary | ICD-10-CM

## 2014-05-31 MED ORDER — AMLODIPINE BESYLATE 5 MG PO TABS: 5.0000 mg | ORAL_TABLET | Freq: Every day | ORAL | Status: DC

## 2014-05-31 NOTE — Progress Notes (Signed)
Pre visit review using our clinic review tool, if applicable. No additional management support is needed unless otherwise documented below in the visit note. 

## 2014-05-31 NOTE — Assessment & Plan Note (Signed)
On B12 

## 2014-05-31 NOTE — Assessment & Plan Note (Addendum)
BP is nl at home Losartan, Bystolic, Norvasc

## 2014-05-31 NOTE — Progress Notes (Signed)
   Subjective:   Pt needs referrals w/her new [employer].   Pt retired 04/15/14.  F/u rapid HR and elevated BP - doing ok. BP is OK at home  F/u elev Ca++  F/u severe R hip pain. Much better now - finished PT. Ortho - Dr Mayer Camel  C/o loose reddish stools (pt is drinking beet juice for health reasons)    HPI  F/u Neck pain - better F/u HTN, allergies  BP Readings from Last 3 Encounters:  05/31/14 138/96  04/21/14 194/120  04/18/14 164/98   Wt Readings from Last 3 Encounters:  05/31/14 145 lb (65.772 kg)  04/21/14 144 lb (65.318 kg)  04/18/14 145 lb 3.2 oz (65.862 kg)     Review of Systems  Constitutional: Negative for chills, activity change, appetite change, fatigue and unexpected weight change.  HENT: Negative for congestion, mouth sores and sinus pressure.   Eyes: Negative for visual disturbance.  Respiratory: Negative for cough and chest tightness.   Gastrointestinal: Negative for nausea and abdominal pain.  Genitourinary: Negative for frequency, difficulty urinating and vaginal pain.  Musculoskeletal: Negative for back pain, gait problem, neck pain and neck stiffness.  Skin: Negative for pallor and rash.  Neurological: Negative for dizziness, tremors, weakness and headaches.  Psychiatric/Behavioral: Negative for suicidal ideas, confusion and sleep disturbance.       Objective:   Physical Exam  Constitutional: She appears well-developed. No distress.  HENT:  Head: Normocephalic.  Right Ear: External ear normal.  Left Ear: External ear normal.  Nose: Nose normal.  Mouth/Throat: Oropharynx is clear and moist.  Eyes: Conjunctivae are normal. Pupils are equal, round, and reactive to light. Right eye exhibits no discharge. Left eye exhibits no discharge.  Neck: Normal range of motion. Neck supple. No JVD present. No tracheal deviation present. No thyromegaly present.  Cardiovascular: Normal rate, regular rhythm and normal heart sounds.   Pulmonary/Chest: No stridor. No  respiratory distress. She has no wheezes.  Abdominal: Soft. Bowel sounds are normal. She exhibits no distension and no mass. There is no tenderness. There is no rebound and no guarding.  Musculoskeletal: She exhibits tenderness. She exhibits no edema.  Lymphadenopathy:    She has no cervical adenopathy.  Neurological: She displays normal reflexes. No cranial nerve deficit. She exhibits normal muscle tone. Coordination normal.  Skin: No rash noted. No erythema.  Psychiatric: She has a normal mood and affect. Her behavior is normal. Judgment and thought content normal.    Lab Results  Component Value Date   WBC 9.3 12/28/2013   HGB 14.2 12/28/2013   HCT 42.7 12/28/2013   PLT 320.0 12/28/2013   GLUCOSE 133* 04/21/2014   CHOL 150 01/25/2013   TRIG 55.0 01/25/2013   HDL 38.90* 01/25/2013   LDLCALC 100* 01/25/2013   ALT 30 04/21/2014   AST 24 04/21/2014   NA 138 04/21/2014   K 4.5 04/21/2014   CL 102 04/21/2014   CREATININE 1.34* 04/21/2014   BUN 24* 04/21/2014   CO2 30 04/21/2014   TSH 1.25 04/21/2014   HGBA1C 5.9 05/18/2013          Assessment & Plan:

## 2014-05-31 NOTE — Assessment & Plan Note (Signed)
Chronic persistent asthma Asmanex Dr Joya Gaskins

## 2014-05-31 NOTE — Assessment & Plan Note (Signed)
Flonase, [employer]

## 2014-05-31 NOTE — Assessment & Plan Note (Signed)
Dr Maylene Roes

## 2014-05-31 NOTE — Assessment & Plan Note (Signed)
C/o loose reddish stools (pt is drinking beet juice for health reasons) GI ref

## 2014-06-20 ENCOUNTER — Telehealth: Payer: Self-pay | Admitting: Internal Medicine

## 2014-06-20 ENCOUNTER — Ambulatory Visit (INDEPENDENT_AMBULATORY_CARE_PROVIDER_SITE_OTHER): Payer: 59 | Admitting: Gastroenterology

## 2014-06-20 ENCOUNTER — Encounter: Payer: Self-pay | Admitting: Gastroenterology

## 2014-06-20 VITALS — BP 108/72 | HR 72 | Ht 62.5 in | Wt 146.4 lb

## 2014-06-20 DIAGNOSIS — R1032 Left lower quadrant pain: Secondary | ICD-10-CM

## 2014-06-20 DIAGNOSIS — K625 Hemorrhage of anus and rectum: Secondary | ICD-10-CM | POA: Diagnosis not present

## 2014-06-20 NOTE — Telephone Encounter (Signed)
Noted. Thx.

## 2014-06-20 NOTE — Progress Notes (Signed)
_                                                                                                                History of Present Illness:  [name] a 54 year old Afro-American female referred at the request of Dr. Alain Marion for evaluation of lower abdominal discomfort.  She complains of intermittent left lower quadrant pain.  It is intermittent and may be improved by moving her bowels.  She has occasional constipation.  She denies rectal bleeding but she has noticed a change in stool color from brown to now more orange.  There has been no change in stool caliber.  Colonoscopy in 2007 was entirely normal.  In 2014 she underwent a CT of the abdomen and pelvis because of complaints of left lower quadrant pain.  Exam was negative.   Past Medical History  Diagnosis Date  . Osteoporosis   . Hypertension   . Depression   . Glaucoma   . Pulmonary sarcoidosis   . Glaucoma   . Vitamin D deficiency   . Anxiety   . Asthma   . Depression   . Herpes simplex   . Osteoarthritis   . Allergy   . Cataract    Past Surgical History  Procedure Laterality Date  . Bunionectomy      x2  . Myomectomy    . Dental surgery    . Dilation and curettage of uterus    . Glaucoma surgery      Right eye   family history includes Cancer in her mother; Hypertension in her father; Stroke in her father. Current Outpatient Prescriptions  Medication Sig Dispense Refill  . acyclovir (ZOVIRAX) 5 % cream Apply topically as needed.      [name] amLODipine (NORVASC) 5 MG tablet Take 1 tablet (5 mg total) by mouth daily. 90 tablet 3  . Dorzolamide HCl-Timolol Mal (COSOPT OP) 1 drop once daily in left eye    . fluticasone (FLONASE) 50 MCG/ACT nasal spray Place 2 sprays into the nose daily.     . Folic Acid-Vit B7-SEG B15 (FOLBEE) 2.5-25-1 MG TABS tablet Take 1 tablet by mouth every other day.     . Hypertonic Nasal Wash (NASAFLO NETI POT NASAL WASH NA) Place into the nose daily.     [name] KLOR-CON/EF 25 MEQ  disintegrating tablet DISSOLVE ONE TABLET IN THE MOUTH TWICE A DAY 60 tablet 5  . losartan (COZAAR) 100 MG tablet TAKE 1 TABLET BY MOUTH ONCE A DAY 30 tablet 5  . mometasone (ASMANEX 60 METERED DOSES) 220 MCG/INH inhaler Inhale 2 puffs into the lungs daily. 1 Inhaler 11  . Multiple Vitamins-Minerals (HAIR/SKIN/NAILS) TABS Take by mouth 2 (two) times daily.     . Nebivolol HCl (BYSTOLIC) 20 MG TABS TAKE 2 TABLETS BY MOUTH DAILY AS DIRECTED 60 tablet 5  . valACYclovir (VALTREX) 1000 MG tablet 1/2 tablet two times a day     No current facility-administered medications for this visit.   Allergies as of 06/20/2014 - Review  Complete 06/20/2014  Allergen Reaction Noted  . Amoxicillin-pot clavulanate    . Cardizem [diltiazem hcl]  02/14/2014  . Carvedilol  02/11/2008  . Cefdinir    . Cephalexin  06/05/2006  . Hydrochlorothiazide  06/05/2006  . Influenza vaccines    . Sulfamethoxazole  06/05/2006  . Sulfonamide derivatives  11/06/2006  . Verapamil  03/09/2014    reports that she has never smoked. She has never used smokeless tobacco. She reports that she does not drink alcohol or use illicit drugs.   Review of Systems: Pertinent positive and negative review of systems were noted in the above HPI section. All other review of systems were otherwise negative.  Vital signs were reviewed in today's medical record Physical Exam: General: Well developed , well nourished, no acute distress Skin: anicteric Head: Normocephalic and atraumatic Eyes:  sclerae anicteric, EOMI Ears: Normal auditory acuity Mouth: No deformity or lesions Neck: Supple, no masses or thyromegaly Lymph Nodes: no lymphadenopathy Lungs: Clear throughout to auscultation Heart: Regular rate and rhythm; no murmurs, rubs or bruits Gastroinestinal: Soft, non tender and non distended. No masses, hepatosplenomegaly or hernias noted. Normal Bowel sounds Rectal:deferred Musculoskeletal: Symmetrical with no gross deformities  Skin:  No lesions on visible extremities Pulses:  Normal pulses noted Extremities: No clubbing, cyanosis, edema or deformities noted Neurological: Alert oriented x 4, grossly nonfocal Cervical Nodes:  No significant cervical adenopathy Inguinal Nodes: No significant inguinal adenopathy Psychological:  Alert and cooperative. Normal mood and affect  See Assessment and Plan under Problem List

## 2014-06-20 NOTE — Assessment & Plan Note (Addendum)
She has very nonspecific intermittent left lower quadrant pain of fairly low intensity.  There has been no change in bowel habits although she claims the color of the stool has changed.  She is very concerned about a structural abnormality and has requested colonoscopy.  Last exam was 2007.  Recommendations #1 colonoscopy  CC Dr. Alain Marion

## 2014-06-20 NOTE — Telephone Encounter (Signed)
Pt came in to inform Dr. Camila Li that her BP today is 107/77, little tired, just starting back on walking today, plan to continue with the walking.

## 2014-06-20 NOTE — Patient Instructions (Signed)

## 2014-07-10 ENCOUNTER — Other Ambulatory Visit: Payer: Self-pay | Admitting: Internal Medicine

## 2014-07-21 ENCOUNTER — Ambulatory Visit: Payer: 59 | Admitting: Internal Medicine

## 2014-07-25 ENCOUNTER — Telehealth: Payer: Self-pay | Admitting: Internal Medicine

## 2014-07-25 DIAGNOSIS — J320 Chronic maxillary sinusitis: Secondary | ICD-10-CM

## 2014-07-25 NOTE — Telephone Encounter (Signed)
Pt called in and wanted to know if he can put a referral to see  Dr. Leonides Sake. Lucia Gaskins, MD  Doctor  Address: [address], [place], [place] [address]  Phone: [phone]  Feels like there is water in her ear and it has been 2 weeks.  It is effecting her hearing

## 2014-07-25 NOTE — Telephone Encounter (Signed)
Fax # for Dr. Lucia Gaskins is [phone] Attn to [facility]

## 2014-07-26 NOTE — Telephone Encounter (Signed)
OK. Thx

## 2014-08-01 ENCOUNTER — Other Ambulatory Visit: Payer: Self-pay | Admitting: Obstetrics and Gynecology

## 2014-08-03 LAB — CYTOLOGY - PAP

## 2014-08-23 ENCOUNTER — Encounter: Payer: Self-pay | Admitting: Internal Medicine

## 2014-08-23 ENCOUNTER — Ambulatory Visit (INDEPENDENT_AMBULATORY_CARE_PROVIDER_SITE_OTHER): Payer: 59 | Admitting: Internal Medicine

## 2014-08-23 ENCOUNTER — Other Ambulatory Visit (INDEPENDENT_AMBULATORY_CARE_PROVIDER_SITE_OTHER): Payer: 59

## 2014-08-23 VITALS — BP 140/98 | HR 71 | Wt 143.0 lb

## 2014-08-23 DIAGNOSIS — J4540 Moderate persistent asthma, uncomplicated: Secondary | ICD-10-CM | POA: Diagnosis not present

## 2014-08-23 DIAGNOSIS — H409 Unspecified glaucoma: Secondary | ICD-10-CM

## 2014-08-23 DIAGNOSIS — I10 Essential (primary) hypertension: Secondary | ICD-10-CM

## 2014-08-23 DIAGNOSIS — E538 Deficiency of other specified B group vitamins: Secondary | ICD-10-CM

## 2014-08-23 LAB — CBC WITH DIFFERENTIAL/PLATELET
BASOS PCT: 0.2 % (ref 0.0–3.0)
Basophils Absolute: 0 10*3/uL (ref 0.0–0.1)
EOS PCT: 3.3 % (ref 0.0–5.0)
Eosinophils Absolute: 0.3 10*3/uL (ref 0.0–0.7)
HCT: 39.6 % (ref 36.0–46.0)
HEMOGLOBIN: 13.4 g/dL (ref 12.0–15.0)
LYMPHS PCT: 20.9 % (ref 12.0–46.0)
Lymphs Abs: 1.7 10*3/uL (ref 0.7–4.0)
MCHC: 33.7 g/dL (ref 30.0–36.0)
MCV: 89.6 fl (ref 78.0–100.0)
MONOS PCT: 12 % (ref 3.0–12.0)
Monocytes Absolute: 1 10*3/uL (ref 0.1–1.0)
NEUTROS ABS: 5.2 10*3/uL (ref 1.4–7.7)
Neutrophils Relative %: 63.6 % (ref 43.0–77.0)
Platelets: 350 10*3/uL (ref 150.0–400.0)
RBC: 4.42 Mil/uL (ref 3.87–5.11)
RDW: 14.2 % (ref 11.5–15.5)
WBC: 8.2 10*3/uL (ref 4.0–10.5)

## 2014-08-23 LAB — HEPATIC FUNCTION PANEL
ALBUMIN: 4.2 g/dL (ref 3.5–5.2)
ALT: 23 U/L (ref 0–35)
AST: 24 U/L (ref 0–37)
Alkaline Phosphatase: 92 U/L (ref 39–117)
BILIRUBIN DIRECT: 0.2 mg/dL (ref 0.0–0.3)
TOTAL PROTEIN: 8.2 g/dL (ref 6.0–8.3)
Total Bilirubin: 0.6 mg/dL (ref 0.2–1.2)

## 2014-08-23 LAB — BASIC METABOLIC PANEL
BUN: 21 mg/dL (ref 6–23)
CALCIUM: 10.7 mg/dL — AB (ref 8.4–10.5)
CO2: 30 mEq/L (ref 19–32)
Chloride: 102 mEq/L (ref 96–112)
Creatinine, Ser: 1.18 mg/dL (ref 0.40–1.20)
GFR: 61.14 mL/min (ref >60.00)
Glucose, Bld: 88 mg/dL (ref 70–99)
Potassium: 4.1 mEq/L (ref 3.5–5.1)
SODIUM: 139 meq/L (ref 135–145)

## 2014-08-23 LAB — TSH: TSH: 2.78 u[IU]/mL (ref 0.35–4.50)

## 2014-08-23 MED ORDER — NEBIVOLOL HCL 20 MG PO TABS: ORAL_TABLET | ORAL | Status: DC

## 2014-08-23 MED ORDER — AMLODIPINE BESYLATE 5 MG PO TABS: 5.0000 mg | ORAL_TABLET | Freq: Every day | ORAL | Status: DC

## 2014-08-23 MED ORDER — CICLOPIROX 8 % EX SOLN: Freq: Every day | CUTANEOUS | Status: DC

## 2014-08-23 MED ORDER — LOSARTAN POTASSIUM 100 MG PO TABS: 100.0000 mg | ORAL_TABLET | Freq: Every day | ORAL | Status: DC

## 2014-08-23 MED ORDER — TRIAMCINOLONE ACETONIDE 0.1 % EX CREA: TOPICAL_CREAM | Freq: Two times a day (BID) | CUTANEOUS | Status: DC

## 2014-08-23 MED ORDER — MOMETASONE FUROATE 220 MCG/INH IN AEPB: 2.0000 | INHALATION_SPRAY | Freq: Every day | RESPIRATORY_TRACT | Status: DC

## 2014-08-23 MED ORDER — GLUCOSE BLOOD VI STRP: ORAL_STRIP | Status: DC

## 2014-08-23 NOTE — Assessment & Plan Note (Signed)
BP is nl at home Losartan, Bystolic, Norvasc

## 2014-08-23 NOTE — Progress Notes (Signed)
Subjective:  Patient ID: [name], female    DOB: October 16, 1960  Age: 54 y.o. MRN: [id number]  CC: No chief complaint on file.   HPI [name] presents for HTN, tachycardia, fatigue, B12 deficiency  Outpatient Prescriptions Prior to Visit  Medication Sig Dispense Refill  . acyclovir (ZOVIRAX) 5 % cream Apply topically as needed.      [name] amLODipine (NORVASC) 5 MG tablet Take 1 tablet (5 mg total) by mouth daily. (Patient taking differently: Take 2.5 mg by mouth daily. ) 90 tablet 3  . Dorzolamide HCl-Timolol Mal (COSOPT OP) 1 drop once daily in left eye    . fluticasone (FLONASE) 50 MCG/ACT nasal spray Place 2 sprays into the nose daily.     . Hypertonic Nasal Wash (NASAFLO NETI POT NASAL WASH NA) Place into the nose daily.     [name] KLOR-CON/EF 25 MEQ disintegrating tablet DISSOLVE ONE TABLET IN THE MOUTH TWICE A DAY 60 tablet 11  . mometasone (ASMANEX 60 METERED DOSES) 220 MCG/INH inhaler Inhale 2 puffs into the lungs daily. 1 Inhaler 11  . Multiple Vitamins-Minerals (HAIR/SKIN/NAILS) TABS Take by mouth 2 (two) times daily.     . Nebivolol HCl (BYSTOLIC) 20 MG TABS TAKE 2 TABLETS BY MOUTH DAILY AS DIRECTED (Patient taking differently: TAKE 1 TABLETS BY MOUTH DAILY AS DIRECTED) 60 tablet 5  . valACYclovir (VALTREX) 1000 MG tablet 1/2 tablet two times a day    . Folic Acid-Vit R5-JOA C16 (FOLBEE) 2.5-25-1 MG TABS tablet Take 1 tablet by mouth every other day.     . losartan (COZAAR) 100 MG tablet TAKE 1 TABLET BY MOUTH ONCE A DAY 30 tablet 5   No facility-administered medications prior to visit.    ROS Review of Systems  Constitutional: Negative for chills, activity change, appetite change, fatigue and unexpected weight change.  HENT: Negative for congestion, mouth sores and sinus pressure.   Eyes: Negative for visual disturbance.  Respiratory: Negative for cough and chest tightness.   Gastrointestinal: Negative for nausea and abdominal pain.  Genitourinary: Negative for  frequency, difficulty urinating and vaginal pain.  Musculoskeletal: Negative for back pain and gait problem.  Skin: Negative for pallor and rash.  Neurological: Negative for dizziness, tremors, weakness, numbness and headaches.  Psychiatric/Behavioral: Negative for confusion and sleep disturbance.    Objective:  BP 140/98 mmHg  Pulse 71  Wt 143 lb (64.864 kg)  SpO2 95%  BP Readings from Last 3 Encounters:  08/23/14 140/98  06/20/14 108/72  05/31/14 138/96    Wt Readings from Last 3 Encounters:  08/23/14 143 lb (64.864 kg)  06/20/14 146 lb 6.4 oz (66.407 kg)  05/31/14 145 lb (65.772 kg)    Physical Exam  Constitutional: She appears well-developed. No distress.  HENT:  Head: Normocephalic.  Right Ear: External ear normal.  Left Ear: External ear normal.  Nose: Nose normal.  Mouth/Throat: Oropharynx is clear and moist.  Eyes: Conjunctivae are normal. Pupils are equal, round, and reactive to light. Right eye exhibits no discharge. Left eye exhibits no discharge.  Neck: Normal range of motion. Neck supple. No JVD present. No tracheal deviation present. No thyromegaly present.  Cardiovascular: Normal rate, regular rhythm and normal heart sounds.   Pulmonary/Chest: No stridor. No respiratory distress. She has no wheezes.  Abdominal: Soft. Bowel sounds are normal. She exhibits no distension and no mass. There is no tenderness. There is no rebound and no guarding.  Musculoskeletal: She exhibits tenderness. She exhibits no edema.  Lymphadenopathy:  She has no cervical adenopathy.  Neurological: She displays normal reflexes. No cranial nerve deficit. She exhibits normal muscle tone. Coordination normal.  Skin: No rash noted. No erythema.  Psychiatric: She has a normal mood and affect. Her behavior is normal. Judgment and thought content normal.    Lab Results  Component Value Date   WBC 9.3 12/28/2013   HGB 14.2 12/28/2013   HCT 42.7 12/28/2013   PLT 320.0 12/28/2013    GLUCOSE 133* 04/21/2014   CHOL 150 01/25/2013   TRIG 55.0 01/25/2013   HDL 38.90* 01/25/2013   LDLCALC 100* 01/25/2013   ALT 30 04/21/2014   AST 24 04/21/2014   NA 138 04/21/2014   K 4.5 04/21/2014   CL 102 04/21/2014   CREATININE 1.34* 04/21/2014   BUN 24* 04/21/2014   CO2 30 04/21/2014   TSH 1.25 04/21/2014   HGBA1C 5.9 05/18/2013    Dg Chest 2 View  04/18/2014   CLINICAL DATA:  Cough.  Sarcoid.  EXAM: CHEST  2 VIEW  COMPARISON:  03/18/2013.  FINDINGS: Mediastinum and hilar structures are normal . Patchy bilateral pulmonary infiltrates are noted in both upper lobes. No pleural effusion or pneumothorax. No acute bony abnormality .  IMPRESSION: 1. Development of patchy pulmonary infiltrates in both upper lobes. Bilateral pneumonia could present in this fashion. Progressive sarcoidosis with pulmonary involvement could present in this fashion.  2. Cardiomegaly .   Electronically Signed   By: Marcello Moores  Register   On: 04/18/2014 12:34    Assessment & Plan:   There are no diagnoses linked to this encounter. I am having [name] maintain her Dorzolamide HCl-Timolol Mal (COSOPT OP), HAIR/SKIN/NAILS, Hypertonic Nasal Wash (NASAFLO NETI POT NASAL WASH NA), valACYclovir, acyclovir cream, fluticasone, mometasone, Nebivolol HCl, losartan, Folic Acid-Vit T5-HRC B63, amLODipine, and KLOR-CON/EF.  No orders of the defined types were placed in this encounter.     Follow-up: No Follow-up on file.  Walker Kehr, MD

## 2014-08-23 NOTE — Assessment & Plan Note (Signed)
Asmanex F/u w/Dr Joya Gaskins

## 2014-08-23 NOTE — Assessment & Plan Note (Signed)
F/u w/Dr Marylynn Pearson

## 2014-08-23 NOTE — Progress Notes (Signed)
Pre visit review using our clinic review tool, if applicable. No additional management support is needed unless otherwise documented below in the visit note. 

## 2014-08-23 NOTE — Assessment & Plan Note (Signed)
On B12 

## 2014-08-24 ENCOUNTER — Encounter: Payer: 59 | Admitting: Gastroenterology

## 2014-08-31 ENCOUNTER — Other Ambulatory Visit: Payer: Self-pay | Admitting: Internal Medicine

## 2014-09-07 ENCOUNTER — Ambulatory Visit (INDEPENDENT_AMBULATORY_CARE_PROVIDER_SITE_OTHER): Payer: 59 | Admitting: Critical Care Medicine

## 2014-09-07 ENCOUNTER — Encounter: Payer: Self-pay | Admitting: Critical Care Medicine

## 2014-09-07 VITALS — BP 124/88 | HR 72 | Temp 98.0°F | Ht 60.25 in | Wt 141.0 lb

## 2014-09-07 DIAGNOSIS — D869 Sarcoidosis, unspecified: Secondary | ICD-10-CM | POA: Diagnosis not present

## 2014-09-07 DIAGNOSIS — J4540 Moderate persistent asthma, uncomplicated: Secondary | ICD-10-CM | POA: Diagnosis not present

## 2014-09-07 NOTE — Progress Notes (Signed)
Subjective:    Patient ID: [name], female    DOB: May 28, 1960, 54 y.o.   MRN: [id number]  HPI 09/08/2014 Chief Complaint  Patient presents with  . Follow-up    Breathing doing well overall.  Feels DOE is at baseline.  PND and nonprod cough.  No chest tightness, CP, or wheezing.    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  Now is good.  Lost weight, less dyspneic.  BP is better.  No chest pain issues  Pt denies any significant sore throat, nasal congestion or excess secretions, fever, chills, sweats, unintended weight loss, pleurtic or exertional chest pain, orthopnea PND, or leg swelling Pt denies any increase in rescue therapy over baseline, denies waking up needing it or having any early am or nocturnal exacerbations of coughing/wheezing/or dyspnea. Pt also denies any obvious fluctuation in symptoms with  weather or environmental change or other alleviating or aggravating factors PUL ASTHMA HISTORY 09/08/2014 03/28/2014 02/16/2014 09/22/2013 09/12/2010  Symptoms 0-2 days/week Daily 0-2 days/week 0-2 days/week 0-2 days/week  Nighttime awakenings 0-2/month >1/wk but not nightly 0-2/month 0-2/month 0-2/month  Interference with activity No limitations Some limitations No limitations No limitations No limitations  SABA use 0-2 days/wk > 2 days/wk--not > 1 x/day 0-2 days/wk 0-2 days/wk 0-2 days/wk  Exacerbations requiring oral steroids 0-1 / year 0-1 / year 0-1 / year 0-1 / year 0-1 / year     Current Medications, Allergies, Complete Past Medical History, Past Surgical History, Family History, and Social History were reviewed in [facility] record per todays encounter:  09/08/2014  Review of Systems  Constitutional: Negative.   HENT: Negative.  Negative for ear pain, postnasal drip, rhinorrhea, sinus pressure, sore throat, trouble swallowing and voice change.   Eyes: Negative.   Respiratory: Positive for cough and shortness  of breath. Negative for apnea, choking, chest tightness, wheezing and stridor.   Cardiovascular: Negative.  Negative for chest pain, palpitations and leg swelling.  Gastrointestinal: Negative.  Negative for nausea, vomiting, abdominal pain and abdominal distention.  Genitourinary: Negative.   Musculoskeletal: Negative.  Negative for myalgias and arthralgias.  Skin: Negative.  Negative for rash.  Allergic/Immunologic: Negative.  Negative for environmental allergies and food allergies.  Neurological: Negative.  Negative for dizziness, syncope, weakness and headaches.  Hematological: Negative.  Negative for adenopathy. Does not bruise/bleed easily.  Psychiatric/Behavioral: Negative.  Negative for sleep disturbance and agitation. The patient is not nervous/anxious.        Objective:   Physical Exam Filed Vitals:   09/07/14 0948  BP: 124/88  Pulse: 72  Temp: 98 F (36.7 C)  TempSrc: Oral  Height: 5' 0.25" (1.53 m)  Weight: 141 lb (63.957 kg)  SpO2: 96%    Gen: Pleasant, well-nourished, in no distress,  normal affect  ENT: No lesions,  mouth clear,  oropharynx clear, no postnasal drip  Neck: No JVD, no TMG, no carotid bruits  Lungs: No use of accessory muscles, no dullness to percussion, clear without rales or rhonchi  Cardiovascular: RRR, heart sounds normal, no murmur or gallops, no peripheral edema  Abdomen: soft and NT, no HSM,  BS normal  Musculoskeletal: No deformities, no cyanosis or clubbing  Neuro: alert, non focal  Skin: Warm, no lesions or rashes  No results found.        Assessment & Plan:  I personally reviewed all images and lab data in the [facility] system as well as any outside material available  during this office visit and agree with the  radiology impressions.   Asthma, moderate persistent Mod persistent asthma , stable at present Plan Cont ICS Cont prn saba Avoid systemic steroids if possible Overall better since quit stressful work environment and  lost weight   SARCOIDOSIS, PULMONARY pulm sarcoidosis Stable at present Upper lobe scar on CT 2014 Needs f/u PFTs, will order     Shivani was seen today for follow-up.  Diagnoses and all orders for this visit:  SARCOIDOSIS, PULMONARY -     Pulmonary Function Test; Future  Asthma, moderate persistent, uncomplicated

## 2014-09-07 NOTE — Patient Instructions (Signed)
No change in medications. Return in      6 months Lung function test will be obtained

## 2014-09-08 NOTE — Assessment & Plan Note (Signed)
Mod persistent asthma , stable at present Plan Cont ICS Cont prn saba Avoid systemic steroids if possible Overall better since quit stressful work environment and lost weight

## 2014-09-08 NOTE — Assessment & Plan Note (Signed)
pulm sarcoidosis Stable at present Upper lobe scar on CT 2014 Needs f/u PFTs, will order

## 2014-09-12 ENCOUNTER — Ambulatory Visit: Payer: 59 | Admitting: Adult Health

## 2014-09-20 ENCOUNTER — Other Ambulatory Visit: Payer: Self-pay | Admitting: Internal Medicine

## 2014-10-05 ENCOUNTER — Other Ambulatory Visit: Payer: Self-pay | Admitting: *Deleted

## 2014-10-05 MED ORDER — ONETOUCH LANCETS MISC: 1.0000 | Status: DC

## 2014-10-05 MED ORDER — ONETOUCH ULTRA SYSTEM W/DEVICE KIT: 1.0000 | PACK | Status: DC

## 2014-10-05 MED ORDER — GLUCOSE BLOOD VI STRP: 1.0000 | ORAL_STRIP | Status: DC

## 2014-10-10 ENCOUNTER — Ambulatory Visit (INDEPENDENT_AMBULATORY_CARE_PROVIDER_SITE_OTHER): Payer: 59

## 2014-10-10 DIAGNOSIS — Z111 Encounter for screening for respiratory tuberculosis: Secondary | ICD-10-CM

## 2014-10-12 LAB — TB SKIN TEST
Induration: 0 mm
TB Skin Test: NEGATIVE

## 2014-11-18 ENCOUNTER — Telehealth: Payer: Self-pay

## 2014-11-18 NOTE — Telephone Encounter (Signed)
PA denied. Sent to pharmacy for further review.

## 2014-11-18 NOTE — Telephone Encounter (Signed)
PA initiated for asmanex via covermymeds. [id number]

## 2014-11-18 NOTE — Telephone Encounter (Signed)
PA approved. Pharmacy notified 

## 2014-11-22 ENCOUNTER — Encounter: Payer: Self-pay | Admitting: Internal Medicine

## 2014-11-22 ENCOUNTER — Other Ambulatory Visit (INDEPENDENT_AMBULATORY_CARE_PROVIDER_SITE_OTHER): Payer: BC Managed Care – PPO

## 2014-11-22 ENCOUNTER — Ambulatory Visit (INDEPENDENT_AMBULATORY_CARE_PROVIDER_SITE_OTHER): Payer: BC Managed Care – PPO | Admitting: Internal Medicine

## 2014-11-22 VITALS — BP 112/64 | HR 73 | Wt 138.0 lb

## 2014-11-22 DIAGNOSIS — E538 Deficiency of other specified B group vitamins: Secondary | ICD-10-CM

## 2014-11-22 DIAGNOSIS — Z23 Encounter for immunization: Secondary | ICD-10-CM

## 2014-11-22 DIAGNOSIS — I10 Essential (primary) hypertension: Secondary | ICD-10-CM | POA: Diagnosis not present

## 2014-11-22 LAB — VITAMIN D 25 HYDROXY (VIT D DEFICIENCY, FRACTURES): VITD: 27.57 ng/mL — AB (ref 30.00–100.00)

## 2014-11-22 LAB — HEPATIC FUNCTION PANEL
ALT: 33 U/L (ref 0–35)
AST: 28 U/L (ref 0–37)
Albumin: 4.6 g/dL (ref 3.5–5.2)
Alkaline Phosphatase: 103 U/L (ref 39–117)
BILIRUBIN DIRECT: 0.1 mg/dL (ref 0.0–0.3)
BILIRUBIN TOTAL: 0.7 mg/dL (ref 0.2–1.2)
Total Protein: 9.1 g/dL — ABNORMAL HIGH (ref 6.0–8.3)

## 2014-11-22 LAB — BASIC METABOLIC PANEL
BUN: 24 mg/dL — ABNORMAL HIGH (ref 6–23)
CALCIUM: 11.8 mg/dL — AB (ref 8.4–10.5)
CO2: 30 meq/L (ref 19–32)
CREATININE: 1.24 mg/dL — AB (ref 0.40–1.20)
Chloride: 101 mEq/L (ref 96–112)
GFR: 57.69 mL/min — ABNORMAL LOW (ref >60.00)
GLUCOSE: 94 mg/dL (ref 70–99)
Potassium: 4.1 mEq/L (ref 3.5–5.1)
Sodium: 140 mEq/L (ref 135–145)

## 2014-11-22 LAB — TSH: TSH: 2.08 u[IU]/mL (ref 0.35–4.50)

## 2014-11-22 MED ORDER — ACYCLOVIR 5 % EX CREA: 1.0000 "application " | TOPICAL_CREAM | CUTANEOUS | Status: DC

## 2014-11-22 MED ORDER — NEBIVOLOL HCL 20 MG PO TABS: ORAL_TABLET | ORAL | Status: DC

## 2014-11-22 NOTE — Assessment & Plan Note (Signed)
Reduce Bystolic to 1/2 tab day

## 2014-11-22 NOTE — Assessment & Plan Note (Addendum)
Labs ?sarcoidosis vs other Endocr ref suggested

## 2014-11-22 NOTE — Progress Notes (Signed)
Subjective:  Patient ID: [name], female    DOB: 08-22-1960  Age: 54 y.o. MRN: [id number]  CC: No chief complaint on file.   HPI MARYAM FEELY presents for HTN, sarcoidosis a  Outpatient Prescriptions Prior to Visit  Medication Sig Dispense Refill  . amLODipine (NORVASC) 5 MG tablet Take 1 tablet (5 mg total) by mouth daily. (Patient taking differently: Take 2.5 mg by mouth daily. ) 30 tablet 11  . Blood Glucose Monitoring Suppl (ONE TOUCH ULTRA SYSTEM KIT) W/DEVICE KIT 1 kit by Does not apply route as directed. 1 each 0  . Dorzolamide HCl-Timolol Mal (COSOPT OP) 1 drop once daily in left eye    . fluticasone (FLONASE) 50 MCG/ACT nasal spray Place 1-2 sprays into the nose daily.     [name] glucose blood test strip 1 each by Other route as directed. 100 each 3  . Hypertonic Nasal Wash (NASAFLO NETI POT NASAL WASH NA) Place into the nose daily.     [name] KLOR-CON/EF 25 MEQ disintegrating tablet DISSOLVE ONE TABLET IN THE MOUTH TWICE A DAY 60 tablet 11  . losartan (COZAAR) 100 MG tablet Take 1 tablet (100 mg total) by mouth daily. 30 tablet 11  . mometasone (ASMANEX 60 METERED DOSES) 220 MCG/INH inhaler Inhale 2 puffs into the lungs daily. 1 Inhaler 11  . Multiple Vitamins-Minerals (HAIR/SKIN/NAILS) TABS Take by mouth 2 (two) times daily.     . ONE TOUCH LANCETS MISC 1 each by Does not apply route as directed. 100 each 3  . valACYclovir (VALTREX) 1000 MG tablet 1/2 tablet twice weekly    . acyclovir (ZOVIRAX) 5 % cream Apply topically as needed.      . Nebivolol HCl (BYSTOLIC) 20 MG TABS TAKE 2 TABLETS BY MOUTH DAILY AS DIRECTED (Patient taking differently: TAKE 1 TABLET BY MOUTH DAILY AS DIRECTED) 60 tablet 11  . ciclopirox (PENLAC) 8 % solution Apply topically at bedtime. Apply over nail and surrounding skin. Apply daily over previous coat. After seven (7) days, may remove with alcohol and continue cycle. (Patient not taking: Reported on 11/22/2014) 6.6 mL 0  . triamcinolone cream  (KENALOG) 0.1 % Apply topically 2 (two) times daily. (Patient not taking: Reported on 11/22/2014) 30 g 3   No facility-administered medications prior to visit.    ROS Review of Systems  Constitutional: Positive for fatigue. Negative for chills, activity change, appetite change and unexpected weight change.  HENT: Negative for congestion, mouth sores and sinus pressure.   Eyes: Negative for visual disturbance.  Respiratory: Negative for cough and chest tightness.   Gastrointestinal: Negative for nausea and abdominal pain.  Genitourinary: Negative for frequency, difficulty urinating and vaginal pain.  Musculoskeletal: Negative for back pain and gait problem.  Skin: Negative for pallor and rash.  Neurological: Negative for dizziness, tremors, weakness, numbness and headaches.  Psychiatric/Behavioral: Negative for confusion and sleep disturbance. The patient is not nervous/anxious.     Objective:  BP 112/64 mmHg  Pulse 73  Wt 138 lb (62.596 kg)  SpO2 96%  BP Readings from Last 3 Encounters:  11/22/14 112/64  09/07/14 124/88  08/23/14 140/98    Wt Readings from Last 3 Encounters:  11/22/14 138 lb (62.596 kg)  09/07/14 141 lb (63.957 kg)  08/23/14 143 lb (64.864 kg)    Physical Exam  Constitutional: She appears well-developed. No distress.  HENT:  Head: Normocephalic.  Right Ear: External ear normal.  Left Ear: External ear normal.  Nose: Nose normal.  Mouth/Throat:  Oropharynx is clear and moist.  Eyes: Conjunctivae are normal. Pupils are equal, round, and reactive to light. Right eye exhibits no discharge. Left eye exhibits no discharge.  Neck: Normal range of motion. Neck supple. No JVD present. No tracheal deviation present. No thyromegaly present.  Cardiovascular: Normal rate, regular rhythm and normal heart sounds.   Pulmonary/Chest: No stridor. No respiratory distress. She has no wheezes.  Abdominal: Soft. Bowel sounds are normal. She exhibits no distension and no  mass. There is no tenderness. There is no rebound and no guarding.  Musculoskeletal: She exhibits no edema or tenderness.  Lymphadenopathy:    She has no cervical adenopathy.  Neurological: She displays normal reflexes. No cranial nerve deficit. She exhibits normal muscle tone. Coordination normal.  Skin: No rash noted. No erythema.  Psychiatric: She has a normal mood and affect. Her behavior is normal. Judgment and thought content normal.    Lab Results  Component Value Date   WBC 8.2 08/23/2014   HGB 13.4 08/23/2014   HCT 39.6 08/23/2014   PLT 350.0 08/23/2014   GLUCOSE 94 11/22/2014   CHOL 150 01/25/2013   TRIG 55.0 01/25/2013   HDL 38.90* 01/25/2013   LDLCALC 100* 01/25/2013   ALT 33 11/22/2014   AST 28 11/22/2014   NA 140 11/22/2014   K 4.1 11/22/2014   CL 101 11/22/2014   CREATININE 1.24* 11/22/2014   BUN 24* 11/22/2014   CO2 30 11/22/2014   TSH 2.08 11/22/2014   HGBA1C 5.9 05/18/2013    Dg Chest 2 View  04/18/2014  CLINICAL DATA:  Cough.  Sarcoid. EXAM: CHEST  2 VIEW COMPARISON:  03/18/2013. FINDINGS: Mediastinum and hilar structures are normal . Patchy bilateral pulmonary infiltrates are noted in both upper lobes. No pleural effusion or pneumothorax. No acute bony abnormality . IMPRESSION: 1. Development of patchy pulmonary infiltrates in both upper lobes. Bilateral pneumonia could present in this fashion. Progressive sarcoidosis with pulmonary involvement could present in this fashion. 2. Cardiomegaly . Electronically Signed   By: Marcello Moores  Register   On: 04/18/2014 12:34    Assessment & Plan:   Diagnoses and all orders for this visit:  Essential hypertension -     Basic metabolic panel; Future -     Hepatic function panel; Future -     TSH; Future -     Vit D  25 hydroxy (rtn osteoporosis monitoring); Future -     PTH, Intact and Calcium; Future  B12 deficiency -     Basic metabolic panel; Future -     Hepatic function panel; Future -     TSH; Future -      Vit D  25 hydroxy (rtn osteoporosis monitoring); Future -     PTH, Intact and Calcium; Future  Hypercalcemia -     Basic metabolic panel; Future -     Hepatic function panel; Future -     TSH; Future -     Vit D  25 hydroxy (rtn osteoporosis monitoring); Future -     PTH, Intact and Calcium; Future  Need for prophylactic vaccination with combined diphtheria-tetanus-pertussis (DTP) vaccine -     Tdap vaccine greater than or equal to 7yo IM  Other orders -     acyclovir cream (ZOVIRAX) 5 %; Apply 1 application topically every 4 (four) hours. -     Nebivolol HCl (BYSTOLIC) 20 MG TABS; 1/2 po qd  I have changed Ms. Davies's acyclovir cream and Nebivolol HCl. I am also having her  maintain her Dorzolamide HCl-Timolol Mal (COSOPT OP), HAIR/SKIN/NAILS, Hypertonic Nasal Wash (NASAFLO NETI POT NASAL WASH NA), valACYclovir, fluticasone, KLOR-CON/EF, losartan, mometasone, ciclopirox, triamcinolone cream, amLODipine, ONE TOUCH ULTRA SYSTEM KIT, glucose blood, ONE TOUCH LANCETS, Omega 3, cyanocobalamin, and Biotin.  Meds ordered this encounter  Medications  . acyclovir cream (ZOVIRAX) 5 %    Sig: Apply 1 application topically every 4 (four) hours.    Dispense:  15 g    Refill:  3  . Omega 3 1000 MG CAPS    Sig: Take 2 capsules by mouth daily.  . cyanocobalamin 1000 MCG tablet    Sig: Take 1,000 mcg by mouth daily.  . Biotin 5000 MCG TABS    Sig: Take by mouth daily.  . Nebivolol HCl (BYSTOLIC) 20 MG TABS    Sig: 1/2 po qd    Dispense:  60 tablet    Refill:  11     Follow-up: Return in about 3 months (around 02/22/2015) for a follow-up visit.  Walker Kehr, MD

## 2014-11-22 NOTE — Progress Notes (Signed)
Pre visit review using our clinic review tool, if applicable. No additional management support is needed unless otherwise documented below in the visit note. 

## 2014-11-22 NOTE — Assessment & Plan Note (Signed)
On B12 

## 2014-11-23 LAB — PTH, INTACT AND CALCIUM
Calcium: 11.3 mg/dL — ABNORMAL HIGH (ref 8.4–10.5)
PTH: 3 pg/mL — ABNORMAL LOW (ref 14–64)

## 2014-11-25 ENCOUNTER — Ambulatory Visit (INDEPENDENT_AMBULATORY_CARE_PROVIDER_SITE_OTHER)
Admission: RE | Admit: 2014-11-25 | Discharge: 2014-11-25 | Disposition: A | Discharge status: Home / Self Care | Payer: BC Managed Care – PPO | Source: Ambulatory Visit | Attending: Adult Health | Admitting: Adult Health

## 2014-11-25 ENCOUNTER — Encounter: Payer: Self-pay | Admitting: Adult Health

## 2014-11-25 ENCOUNTER — Other Ambulatory Visit: Payer: Self-pay | Admitting: Adult Health

## 2014-11-25 ENCOUNTER — Ambulatory Visit (INDEPENDENT_AMBULATORY_CARE_PROVIDER_SITE_OTHER): Payer: BC Managed Care – PPO | Admitting: Adult Health

## 2014-11-25 ENCOUNTER — Telehealth: Payer: Self-pay | Admitting: Internal Medicine

## 2014-11-25 VITALS — BP 120/68 | HR 78 | Temp 98.1°F | Ht 60.0 in | Wt 138.0 lb

## 2014-11-25 DIAGNOSIS — J4541 Moderate persistent asthma with (acute) exacerbation: Secondary | ICD-10-CM

## 2014-11-25 DIAGNOSIS — D869 Sarcoidosis, unspecified: Secondary | ICD-10-CM

## 2014-11-25 MED ORDER — AZITHROMYCIN 250 MG PO TABS: ORAL_TABLET | ORAL | Status: AC

## 2014-11-25 MED ORDER — PREDNISONE 10 MG PO TABS: ORAL_TABLET | ORAL | Status: DC

## 2014-11-25 NOTE — Addendum Note (Signed)
Addended by: Osa Craver on: 11/25/2014 04:18 PM   Modules accepted: Orders

## 2014-11-25 NOTE — Progress Notes (Signed)
Subjective:    Patient ID: [name], female    DOB: 1960/01/24, 54 y.o.   MRN: [id number]  HPI 54 yo female , never smoker, with asthma and sarcoid   TEST CT Chest 06/05/2012>>progression of upper lobe infiltrates   11/25/2014 Acute OV  Former [facility] pt here c/o non prod cough, chest congestion, SOB with exertion, and occasional sinus drainage since 11/21/14. Denies any sinus pressure, chest tightness, fever, nausea or vomiting.  Says has had a dry cough for last 4 weeks . Worse this past week.  She denies fever, discolored mucus, orthopnea or edema  Not taking any meds.    Has mulitple drug allergies. Says she can tolerate zpack .  Remains on Asmanex. .    Past Medical History  Diagnosis Date  . Osteoporosis   . Hypertension   . Depression   . Glaucoma   . Pulmonary sarcoidosis ([place])   . Glaucoma   . Vitamin D deficiency   . Anxiety   . Asthma   . Depression   . Herpes simplex   . Osteoarthritis   . Allergy   . Cataract    Current Outpatient Prescriptions on File Prior to Visit  Medication Sig Dispense Refill  . amLODipine (NORVASC) 5 MG tablet Take 1 tablet (5 mg total) by mouth daily. (Patient taking differently: Take 2.5 mg by mouth daily. ) 30 tablet 11  . Biotin 5000 MCG TABS Take by mouth daily.    . Blood Glucose Monitoring Suppl (ONE TOUCH ULTRA SYSTEM KIT) W/DEVICE KIT 1 kit by Does not apply route as directed. 1 each 0  . cyanocobalamin 1000 MCG tablet Take 1,000 mcg by mouth daily.    . Dorzolamide HCl-Timolol Mal (COSOPT OP) 1 drop once daily in left eye    . fluticasone (FLONASE) 50 MCG/ACT nasal spray Place 1-2 sprays into the nose daily.     [name] glucose blood test strip 1 each by Other route as directed. 100 each 3  . Hypertonic Nasal Wash (NASAFLO NETI POT NASAL WASH NA) Place into the nose daily.     [name] KLOR-CON/EF 25 MEQ disintegrating tablet DISSOLVE ONE TABLET IN THE MOUTH TWICE A DAY 60 tablet 11  . losartan (COZAAR) 100 MG tablet Take 1 tablet  (100 mg total) by mouth daily. 30 tablet 11  . mometasone (ASMANEX 60 METERED DOSES) 220 MCG/INH inhaler Inhale 2 puffs into the lungs daily. 1 Inhaler 11  . Multiple Vitamins-Minerals (HAIR/SKIN/NAILS) TABS Take by mouth 2 (two) times daily.     . Nebivolol HCl (BYSTOLIC) 20 MG TABS 1/2 po qd 60 tablet 11  . Omega 3 1000 MG CAPS Take 2 capsules by mouth daily.    . ONE TOUCH LANCETS MISC 1 each by Does not apply route as directed. 100 each 3  . valACYclovir (VALTREX) 1000 MG tablet 1/2 tablet twice weekly    . acyclovir cream (ZOVIRAX) 5 % Apply 1 application topically every 4 (four) hours. (Patient not taking: Reported on 11/25/2014) 15 g 3  . ciclopirox (PENLAC) 8 % solution Apply topically at bedtime. Apply over nail and surrounding skin. Apply daily over previous coat. After seven (7) days, may remove with alcohol and continue cycle. (Patient not taking: Reported on 11/22/2014) 6.6 mL 0  . triamcinolone cream (KENALOG) 0.1 % Apply topically 2 (two) times daily. (Patient not taking: Reported on 11/25/2014) 30 g 3   No current facility-administered medications on file prior to visit.     Review  of Systems  Constitutional:   No  weight loss, night sweats,  Fevers, chills,  +fatigue, or  lassitude.  HEENT:   No headaches,  Difficulty swallowing,  Tooth/dental problems, or  Sore throat,                No sneezing, itching, ear ache, nasal congestion, post nasal drip,   CV:  No chest pain,  Orthopnea, PND, swelling in lower extremities, anasarca, dizziness, palpitations, syncope.   GI  No heartburn, indigestion, abdominal pain, nausea, vomiting, diarrhea, change in bowel habits, loss of appetite, bloody stools.   Resp:   No chest wall deformity  Skin: no rash or lesions.  GU: no dysuria, change in color of urine, no urgency or frequency.  No flank pain, no hematuria   MS:  No joint pain or swelling.  No decreased range of motion.  No back pain.  Psych:  No change in mood or affect. No  depression or anxiety.  No memory loss.          Objective:   Physical Exam GEN: A/Ox3; pleasant , NAD, well nourished , dry cough  VS reviewed   HEENT:  [place]/AT,  EACs-clear, TMs-wnl, NOSE-clear, THROAT-clear, no lesions, no postnasal drip or exudate noted.   NECK:  Supple w/ fair ROM; no JVD; normal carotid impulses w/o bruits; no thyromegaly or nodules palpated; no lymphadenopathy.  RESP  Clear  P & A; w/o, wheezes/ rales/ or rhonchi.no accessory muscle use, no dullness to percussion  CARD:  RRR, no m/r/g  , no peripheral edema, pulses intact, no cyanosis or clubbing.  GI:   Soft & nt; nml bowel sounds; no organomegaly or masses detected.  Musco: Warm bil, no deformities or joint swelling noted.   Neuro: alert, no focal deficits noted.    Skin: Warm, no lesions or rashes        Assessment & Plan:

## 2014-11-25 NOTE — Telephone Encounter (Signed)
Patient came in requesting call back on lab results

## 2014-11-25 NOTE — Progress Notes (Signed)
Quick Note:  Called and spoke with pt. Reviewed TP's results and recs. Pt voiced understanding and had no further questions. PFT scheduled for 01/30/15 at 10am and ov with BQ on 01/30/15 at 11:15am. Nothing further needed. PFT order placed. ______

## 2014-11-25 NOTE — Assessment & Plan Note (Signed)
Mild flare   Plan  zpack take as directed .  Prednisone 20mg  daily for 4 days then 10mg  daily for 4 days and stop.  Mucinex DM Twice daily  As needed  Cough/congestion  Chest xray today  Follow up with Dr. [place] Bells in 2 months and As needed   Please contact office for sooner follow up if symptoms do not improve or worsen or seek emergency care

## 2014-11-25 NOTE — Assessment & Plan Note (Signed)
Mild flare  Check cxr   Plan  pack take as directed .  Prednisone 20mg  daily for 4 days then 10mg  daily for 4 days and stop.  Mucinex DM Twice daily  As needed  Cough/congestion  Chest xray today  Follow up with Dr. [place] Bells in 2 months and As needed   Please contact office for sooner follow up if symptoms do not improve or worsen or seek emergency care

## 2014-11-25 NOTE — Patient Instructions (Signed)
Zpack take as directed .  Prednisone 20mg  daily for 4 days then 10mg  daily for 4 days and stop.  Mucinex DM Twice daily  As needed  Cough/congestion  Chest xray today  Follow up with Dr. [place] Bells in 2 months and As needed   Please contact office for sooner follow up if symptoms do not improve or worsen or seek emergency care

## 2014-11-28 NOTE — Progress Notes (Signed)
Reviewed, agree with this plan of care 

## 2014-12-01 ENCOUNTER — Telehealth: Payer: Self-pay | Admitting: Adult Health

## 2014-12-01 NOTE — Telephone Encounter (Signed)
Glad she is feeling better . Fine to stop pred pack  follow up as planned and As needed   Please contact office for sooner follow up if symptoms do not improve or worsen or seek emergency care

## 2014-12-01 NOTE — Telephone Encounter (Signed)
Pt informed of below- she states she is not working currently. She declines Endo referral for now. She wants to know if she can do anything to lower her calcium with diet. She states she does not drink milk or eat yogurt often and she only eats cheese occasionally. She has not been taking Vit D, but she still has some and will start taking Vit D today. Please advise.    Entered by Cassandria Anger, MD at 11/23/2014 10:52 PM    Dear Ms Yong Channel,  Your labs/tests show low vitamin D and elevated calcium. I would like to have yo see an endocrinologist. Is it ok for me to refer you? Are you taking Vitamin D?  Sincerely,  Walker Kehr, MD

## 2014-12-01 NOTE — Telephone Encounter (Signed)
Spoke with pt. She reports she is going to stop the prednisone but finished the ABX. She reports she is feeling good and better. She reports the prednisone raised and last night was 153/111 w/ pulse of 86. She reports prednisone caused her BP to raise in the past. FYI to [place].

## 2014-12-02 ENCOUNTER — Encounter: Payer: Self-pay | Admitting: Family

## 2014-12-02 ENCOUNTER — Other Ambulatory Visit (INDEPENDENT_AMBULATORY_CARE_PROVIDER_SITE_OTHER): Payer: BC Managed Care – PPO

## 2014-12-02 ENCOUNTER — Ambulatory Visit (INDEPENDENT_AMBULATORY_CARE_PROVIDER_SITE_OTHER): Payer: BC Managed Care – PPO | Admitting: Family

## 2014-12-02 VITALS — BP 128/94 | HR 110 | Temp 98.4°F | Resp 18 | Ht 60.0 in | Wt 141.4 lb

## 2014-12-02 DIAGNOSIS — R42 Dizziness and giddiness: Secondary | ICD-10-CM

## 2014-12-02 DIAGNOSIS — R0789 Other chest pain: Secondary | ICD-10-CM

## 2014-12-02 LAB — CBC WITH DIFFERENTIAL/PLATELET
BASOS ABS: 0.1 10*3/uL (ref 0.0–0.1)
Basophils Relative: 0.7 % (ref 0.0–3.0)
Eosinophils Absolute: 0.3 10*3/uL (ref 0.0–0.7)
Eosinophils Relative: 3.2 % (ref 0.0–5.0)
HEMATOCRIT: 44.8 % (ref 36.0–46.0)
Hemoglobin: 15 g/dL (ref 12.0–15.0)
LYMPHS PCT: 18.7 % (ref 12.0–46.0)
Lymphs Abs: 1.8 10*3/uL (ref 0.7–4.0)
MCHC: 33.6 g/dL (ref 30.0–36.0)
MCV: 87.6 fl (ref 78.0–100.0)
MONOS PCT: 12.9 % — AB (ref 3.0–12.0)
Monocytes Absolute: 1.2 10*3/uL — ABNORMAL HIGH (ref 0.1–1.0)
Neutro Abs: 6.2 10*3/uL (ref 1.4–7.7)
Neutrophils Relative %: 64.5 % (ref 43.0–77.0)
Platelets: 373 10*3/uL (ref 150.0–400.0)
RBC: 5.11 Mil/uL (ref 3.87–5.11)
RDW: 16.2 % — ABNORMAL HIGH (ref 11.5–15.5)
WBC: 9.6 10*3/uL (ref 4.0–10.5)

## 2014-12-02 LAB — BASIC METABOLIC PANEL
BUN: 24 mg/dL — ABNORMAL HIGH (ref 6–23)
CALCIUM: 10.1 mg/dL (ref 8.4–10.5)
CO2: 31 mEq/L (ref 19–32)
Chloride: 101 mEq/L (ref 96–112)
Creatinine, Ser: 1.19 mg/dL (ref 0.40–1.20)
GFR: 60.49 mL/min (ref >60.00)
Glucose, Bld: 85 mg/dL (ref 70–99)
Potassium: 4.2 mEq/L (ref 3.5–5.1)
SODIUM: 138 meq/L (ref 135–145)

## 2014-12-02 NOTE — Patient Instructions (Addendum)
Thank you for choosing [facility].  Summary/Instructions:   Please stop by the lab on the basement level of the building for your blood work. Your results will be released to [place] (or called to you) after review, usually within 72 hours after test completion. If any changes need to be made, you will be notified at that same time.  If your symptoms worsen or fail to improve, please contact our office for further instruction, or in case of emergency go directly to the emergency room at the closest medical facility.   Please drink plenty of fluids and continue to monitor your symptoms if they return.

## 2014-12-02 NOTE — Progress Notes (Signed)
Pre visit review using our clinic review tool, if applicable. No additional management support is needed unless otherwise documented below in the visit note. 

## 2014-12-02 NOTE — Progress Notes (Signed)
Subjective:    Patient ID: [name], female    DOB: July 10, 1960, 54 y.o.   MRN: [id number]  Chief Complaint  Patient presents with  . Dizziness    for the last 2 days she has had elevated BP, and yesterday started dizzy spells, thinks the prednisone has something to do with it, has a discomfort through left breast that she noticed wednesday night    HPI:  [name] is a 54 y.o. female who  has a past medical history of Osteoporosis; Hypertension; Depression; Glaucoma; Pulmonary sarcoidosis ([place]); Glaucoma; Vitamin D deficiency; Anxiety; Asthma; Depression; Herpes simplex; Osteoarthritis; Allergy; and Cataract. and presents today for an acute office visit.   1.) Dizziness - This is a new problem. Associated symptom of elevated blood pressure and dizziness has been going on for approximately 2 days. She was recently seen in pulmonology for asthma and started on azithromycin and prednisone. Previous noted that prednisone resulted in these symptoms. Reports she felt so dizzy she could barely stand up and modifying factors include sitting for 10-15 minutes which resolved her symptoms. Describes the dizziness as her "head was being attacked."  Continues to drink some caffeine. Does state she drinks several bottles of the 16 oz each over the course of the day. Frequency has only been twice with no current episodes. Also mentions a weird feeling located on the left side of her chest near her breast that just started recently.    Allergies  Allergen Reactions  . Amoxicillin-Pot Clavulanate     REACTION: GI symptoms  . Cardizem [Diltiazem Hcl]     HAs Pt can take Norvasc  . Carvedilol     REACTION: tired  . Cefdinir   . Cephalexin     REACTION: unspecified  . Hydrochlorothiazide     REACTION: unspecified  . Influenza Vaccines     Allergic to flu vaccinations per pt.  Soreness and "cold symptoms" per pt  . Sulfamethoxazole     REACTION: unspecified  . Sulfonamide Derivatives     . Verapamil     fatigue     Current Outpatient Prescriptions on File Prior to Visit  Medication Sig Dispense Refill  . amLODipine (NORVASC) 5 MG tablet Take 1 tablet (5 mg total) by mouth daily. (Patient taking differently: Take 2.5 mg by mouth daily. ) 30 tablet 11  . Biotin 5000 MCG TABS Take by mouth daily.    . Blood Glucose Monitoring Suppl (ONE TOUCH ULTRA SYSTEM KIT) W/DEVICE KIT 1 kit by Does not apply route as directed. 1 each 0  . ciclopirox (PENLAC) 8 % solution Apply topically at bedtime. Apply over nail and surrounding skin. Apply daily over previous coat. After seven (7) days, may remove with alcohol and continue cycle. 6.6 mL 0  . cyanocobalamin 1000 MCG tablet Take 1,000 mcg by mouth daily.    . Dorzolamide HCl-Timolol Mal (COSOPT OP) 1 drop once daily in left eye    . fluticasone (FLONASE) 50 MCG/ACT nasal spray Place 1-2 sprays into the nose daily.     [name] glucose blood test strip 1 each by Other route as directed. 100 each 3  . guaiFENesin (MUCINEX) 600 MG 12 hr tablet Take by mouth 2 (two) times daily.    . Hypertonic Nasal Wash (NASAFLO NETI POT NASAL WASH NA) Place into the nose daily.     [name] KLOR-CON/EF 25 MEQ disintegrating tablet DISSOLVE ONE TABLET IN THE MOUTH TWICE A DAY 60 tablet 11  . losartan (  COZAAR) 100 MG tablet Take 1 tablet (100 mg total) by mouth daily. 30 tablet 11  . mometasone (ASMANEX 60 METERED DOSES) 220 MCG/INH inhaler Inhale 2 puffs into the lungs daily. 1 Inhaler 11  . Multiple Vitamins-Minerals (HAIR/SKIN/NAILS) TABS Take by mouth 2 (two) times daily.     . Nebivolol HCl (BYSTOLIC) 20 MG TABS 1/2 po qd 60 tablet 11  . Omega 3 1000 MG CAPS Take 2 capsules by mouth daily.    . ONE TOUCH LANCETS MISC 1 each by Does not apply route as directed. 100 each 3  . valACYclovir (VALTREX) 1000 MG tablet 1/2 tablet twice weekly     No current facility-administered medications on file prior to visit.    Past Medical History  Diagnosis Date  .  Osteoporosis   . Hypertension   . Depression   . Glaucoma   . Pulmonary sarcoidosis ([place])   . Glaucoma   . Vitamin D deficiency   . Anxiety   . Asthma   . Depression   . Herpes simplex   . Osteoarthritis   . Allergy   . Cataract      Past Surgical History  Procedure Laterality Date  . Bunionectomy      x2  . Myomectomy    . Dental surgery    . Dilation and curettage of uterus    . Glaucoma surgery      Right eye    Review of Systems  Constitutional: Negative for fever and chills.  Respiratory: Negative for chest tightness and shortness of breath.   Cardiovascular: Negative for chest pain, palpitations and leg swelling.  Neurological: Positive for dizziness and light-headedness.      Objective:    BP 128/94 mmHg  Pulse 110  Temp(Src) 98.4 F (36.9 C) (Oral)  Resp 18  Ht 5' (1.524 m)  Wt 141 lb 6.4 oz (64.139 kg)  BMI 27.62 kg/m2  SpO2 96% Nursing note and vital signs reviewed.  Physical Exam  Constitutional: She is oriented to person, place, and time. She appears well-developed and well-nourished. No distress.  HENT:  Right Ear: Hearing, tympanic membrane, external ear and ear canal normal.  Left Ear: Hearing, tympanic membrane, external ear and ear canal normal.  Nose: Nose normal. Right sinus exhibits no maxillary sinus tenderness and no frontal sinus tenderness. Left sinus exhibits no maxillary sinus tenderness and no frontal sinus tenderness.  Mouth/Throat: Uvula is midline, oropharynx is clear and moist and mucous membranes are normal. No posterior oropharyngeal erythema.  Eyes: Conjunctivae and EOM are normal. Pupils are equal, round, and reactive to light.  Neck: Neck supple.  Cardiovascular: Normal rate, regular rhythm, normal heart sounds and intact distal pulses.   Pulmonary/Chest: Effort normal and breath sounds normal.  Neurological: She is alert and oriented to person, place, and time.  Skin: Skin is warm and dry.  Psychiatric: She has a normal  mood and affect. Her behavior is normal. Judgment and thought content normal.       Assessment & Plan:   Problem List Items Addressed This Visit      Other   Dizziness    No direct cause for the dizziness identifiable. Not currently experiencing symptoms. Ear nose and throat and eye exam are within normal limits. Unable to reproduce symptoms with change of positions. In office EKG shows normal sinus rhythm. Obtain complete metabolic panel and CBC with differential to rule out metabolic causes. This is unlikely cardiovascular nature and most likely transient. Encourage nonalcoholic/non-caffeinated beverages.  Follow-up if symptoms worsen or fail to improve.      Relevant Orders   Basic Metabolic Panel (BMET) (Completed)   CBC w/Diff (Completed)    Other Visit Diagnoses    Chest discomfort    -  Primary    Relevant Orders    EKG 12-Lead (Completed)

## 2014-12-02 NOTE — Assessment & Plan Note (Addendum)
No direct cause for the dizziness identifiable. Not currently experiencing symptoms. Ear nose and throat and eye exam are within normal limits. Unable to reproduce symptoms with change of positions. In office EKG shows normal sinus rhythm. Obtain complete metabolic panel and CBC with differential to rule out metabolic causes. This is unlikely cardiovascular nature and most likely transient. Encourage nonalcoholic/non-caffeinated beverages. Follow-up if symptoms worsen or fail to improve.

## 2014-12-05 ENCOUNTER — Encounter: Payer: Self-pay | Admitting: Family

## 2014-12-05 NOTE — Telephone Encounter (Signed)
Take Vit D I would still recommend Endocrinology consult Thx

## 2015-01-30 ENCOUNTER — Ambulatory Visit: Payer: BC Managed Care – PPO | Admitting: Pulmonary Disease

## 2015-02-21 ENCOUNTER — Other Ambulatory Visit (INDEPENDENT_AMBULATORY_CARE_PROVIDER_SITE_OTHER): Payer: BC Managed Care – PPO

## 2015-02-21 ENCOUNTER — Ambulatory Visit (INDEPENDENT_AMBULATORY_CARE_PROVIDER_SITE_OTHER): Payer: BC Managed Care – PPO | Admitting: Internal Medicine

## 2015-02-21 ENCOUNTER — Encounter: Payer: Self-pay | Admitting: Internal Medicine

## 2015-02-21 VITALS — BP 139/82 | HR 81 | Wt 134.0 lb

## 2015-02-21 DIAGNOSIS — E538 Deficiency of other specified B group vitamins: Secondary | ICD-10-CM | POA: Diagnosis not present

## 2015-02-21 DIAGNOSIS — Z1211 Encounter for screening for malignant neoplasm of colon: Secondary | ICD-10-CM | POA: Insufficient documentation

## 2015-02-21 DIAGNOSIS — I10 Essential (primary) hypertension: Secondary | ICD-10-CM

## 2015-02-21 DIAGNOSIS — E876 Hypokalemia: Secondary | ICD-10-CM

## 2015-02-21 LAB — BASIC METABOLIC PANEL
BUN: 23 mg/dL (ref 6–23)
CALCIUM: 12.2 mg/dL — AB (ref 8.4–10.5)
CO2: 31 mEq/L (ref 19–32)
Chloride: 102 mEq/L (ref 96–112)
Creatinine, Ser: 1.35 mg/dL — ABNORMAL HIGH (ref 0.40–1.20)
GFR: 52.25 mL/min — AB (ref >60.00)
GLUCOSE: 89 mg/dL (ref 70–99)
POTASSIUM: 3.8 meq/L (ref 3.5–5.1)
SODIUM: 138 meq/L (ref 135–145)

## 2015-02-21 LAB — HEPATIC FUNCTION PANEL
ALBUMIN: 4.1 g/dL (ref 3.5–5.2)
ALT: 22 U/L (ref 0–35)
AST: 23 U/L (ref 0–37)
Alkaline Phosphatase: 92 U/L (ref 39–117)
BILIRUBIN DIRECT: 0.1 mg/dL (ref 0.0–0.3)
TOTAL PROTEIN: 8.1 g/dL (ref 6.0–8.3)
Total Bilirubin: 0.7 mg/dL (ref 0.2–1.2)

## 2015-02-21 LAB — TSH: TSH: 2.6 u[IU]/mL (ref 0.35–4.50)

## 2015-02-21 LAB — VITAMIN D 25 HYDROXY (VIT D DEFICIENCY, FRACTURES): VITD: 32.44 ng/mL (ref 30.00–100.00)

## 2015-02-21 MED ORDER — ACYCLOVIR 5 % EX CREA: 1.0000 | TOPICAL_CREAM | CUTANEOUS | Status: DC

## 2015-02-21 NOTE — Assessment & Plan Note (Signed)
BP is nl at home Losartan, Bystolic, Norvasc 

## 2015-02-21 NOTE — Patient Instructions (Signed)
Wt Readings from Last 3 Encounters:  02/21/15 134 lb (60.782 kg)  12/02/14 141 lb 6.4 oz (64.139 kg)  11/25/14 138 lb (62.596 kg)   BP Readings from Last 3 Encounters:  02/21/15 139/82  12/02/14 128/94  11/25/14 120/68

## 2015-02-21 NOTE — Assessment & Plan Note (Signed)
On B12 

## 2015-02-21 NOTE — Assessment & Plan Note (Signed)
Labs

## 2015-02-21 NOTE — Progress Notes (Signed)
Pre visit review using our clinic review tool, if applicable. No additional management support is needed unless otherwise documented below in the visit note. 

## 2015-02-21 NOTE — Assessment & Plan Note (Signed)
BMET and Vit D Dr Hollace Hayward if needed (Endo)

## 2015-02-21 NOTE — Progress Notes (Signed)
Subjective:  Patient ID: [name], female    DOB: 1960-07-23  Age: 55 y.o. MRN: [id number]  CC: No chief complaint on file.   HPI [name] presents for HTN, elev glu, abn Ca and sarcoidosis f/u  Outpatient Prescriptions Prior to Visit  Medication Sig Dispense Refill  . amLODipine (NORVASC) 5 MG tablet Take 1 tablet (5 mg total) by mouth daily. (Patient taking differently: Take 2.5 mg by mouth daily. ) 30 tablet 11  . Blood Glucose Monitoring Suppl (ONE TOUCH ULTRA SYSTEM KIT) W/DEVICE KIT 1 kit by Does not apply route as directed. 1 each 0  . ciclopirox (PENLAC) 8 % solution Apply topically at bedtime. Apply over nail and surrounding skin. Apply daily over previous coat. After seven (7) days, may remove with alcohol and continue cycle. 6.6 mL 0  . cyanocobalamin 1000 MCG tablet Take 1,000 mcg by mouth daily.    . Dorzolamide HCl-Timolol Mal (COSOPT OP) 1 drop once daily in left eye    . fluticasone (FLONASE) 50 MCG/ACT nasal spray Place 1-2 sprays into the nose daily.     [name] glucose blood test strip 1 each by Other route as directed. 100 each 3  . Hypertonic Nasal Wash (NASAFLO NETI POT NASAL WASH NA) Place into the nose daily.     [name] KLOR-CON/EF 25 MEQ disintegrating tablet DISSOLVE ONE TABLET IN THE MOUTH TWICE A DAY 60 tablet 11  . mometasone (ASMANEX 60 METERED DOSES) 220 MCG/INH inhaler Inhale 2 puffs into the lungs daily. 1 Inhaler 11  . Multiple Vitamins-Minerals (HAIR/SKIN/NAILS) TABS Take by mouth 2 (two) times daily.     . Nebivolol HCl (BYSTOLIC) 20 MG TABS 1/2 po qd 60 tablet 11  . Omega 3 1000 MG CAPS Take 2 capsules by mouth daily.    . ONE TOUCH LANCETS MISC 1 each by Does not apply route as directed. 100 each 3  . valACYclovir (VALTREX) 1000 MG tablet 1/2 tablet twice weekly    . Biotin 5000 MCG TABS Take by mouth daily. Reported on 02/21/2015    . guaiFENesin (MUCINEX) 600 MG 12 hr tablet Take by mouth 2 (two) times daily. Reported on 02/21/2015    . losartan  (COZAAR) 100 MG tablet Take 1 tablet (100 mg total) by mouth daily. 30 tablet 11   No facility-administered medications prior to visit.    ROS Review of Systems  Constitutional: Positive for fatigue. Negative for chills, activity change, appetite change and unexpected weight change.  HENT: Negative for congestion, mouth sores and sinus pressure.   Eyes: Negative for visual disturbance.  Respiratory: Positive for cough. Negative for chest tightness.   Cardiovascular: Negative for chest pain.  Gastrointestinal: Negative for nausea and abdominal pain.  Genitourinary: Negative for frequency, difficulty urinating and vaginal pain.  Musculoskeletal: Negative for back pain and gait problem.  Skin: Negative for pallor and rash.  Neurological: Negative for dizziness, tremors, weakness, numbness and headaches.  Psychiatric/Behavioral: Negative for suicidal ideas, confusion and sleep disturbance.    Objective:  BP 139/82 mmHg  Pulse 81  Wt 134 lb (60.782 kg)  SpO2 97%  BP Readings from Last 3 Encounters:  02/21/15 139/82  12/02/14 128/94  11/25/14 120/68    Wt Readings from Last 3 Encounters:  02/21/15 134 lb (60.782 kg)  12/02/14 141 lb 6.4 oz (64.139 kg)  11/25/14 138 lb (62.596 kg)    Physical Exam  Constitutional: She appears well-developed. No distress.  HENT:  Head: Normocephalic.  Right  Ear: External ear normal.  Left Ear: External ear normal.  Nose: Nose normal.  Mouth/Throat: Oropharynx is clear and moist.  Eyes: Conjunctivae are normal. Pupils are equal, round, and reactive to light. Right eye exhibits no discharge. Left eye exhibits no discharge.  Neck: Normal range of motion. Neck supple. No JVD present. No tracheal deviation present. No thyromegaly present.  Cardiovascular: Normal rate, regular rhythm and normal heart sounds.   Pulmonary/Chest: No stridor. No respiratory distress. She has no wheezes.  Abdominal: Soft. Bowel sounds are normal. She exhibits no  distension and no mass. There is no tenderness. There is no rebound and no guarding.  Musculoskeletal: She exhibits tenderness. She exhibits no edema.  Lymphadenopathy:    She has no cervical adenopathy.  Neurological: She displays normal reflexes. No cranial nerve deficit. She exhibits normal muscle tone. Coordination normal.  Skin: No rash noted. No erythema.  Psychiatric: She has a normal mood and affect. Her behavior is normal. Judgment and thought content normal.    Lab Results  Component Value Date   WBC 9.6 12/02/2014   HGB 15.0 12/02/2014   HCT 44.8 12/02/2014   PLT 373.0 12/02/2014   GLUCOSE 89 02/21/2015   CHOL 150 01/25/2013   TRIG 55.0 01/25/2013   HDL 38.90* 01/25/2013   LDLCALC 100* 01/25/2013   ALT 22 02/21/2015   AST 23 02/21/2015   NA 138 02/21/2015   K 3.8 02/21/2015   CL 102 02/21/2015   CREATININE 1.35* 02/21/2015   BUN 23 02/21/2015   CO2 31 02/21/2015   TSH 2.60 02/21/2015   HGBA1C 5.9 05/18/2013    Dg Chest 2 View  11/25/2014  CLINICAL DATA:  Sarcoidosis. EXAM: CHEST  2 VIEW COMPARISON:  04/18/2014.  03/18/2013 .  CT 06/05/2012 FINDINGS: Persistent mediastinal and hilar fullness consistent with adenopathy. Progressive upper lobe consolidations scans. These findings are consistent with progressive sarcoidosis. Underlying infection cannot be excluded. No pleural effusion or pneumothorax. IMPRESSION: Findings consistent with progressive changes of sarcoidosis with mediastinum and hilar adenopathy and progressive bilateral upper lobe consolidation . Electronically Signed   By: Marcello Moores  Register   On: 11/25/2014 16:34    Assessment & Plan:   Diagnoses and all orders for this visit:  B12 deficiency -     Basic metabolic panel; Future -     TSH; Future -     Hepatic function panel; Future -     VITAMIN D 25 Hydroxy (Vit-D Deficiency, Fractures); Future  Hypercalcemia -     Basic metabolic panel; Future -     TSH; Future -     Hepatic function panel;  Future -     VITAMIN D 25 Hydroxy (Vit-D Deficiency, Fractures); Future -     PTH, Intact and Calcium; Future  Colon cancer screening -     Ambulatory referral to Gastroenterology -     Basic metabolic panel; Future -     TSH; Future -     Hepatic function panel; Future -     VITAMIN D 25 Hydroxy (Vit-D Deficiency, Fractures); Future  Essential hypertension -     Basic metabolic panel; Future -     TSH; Future -     Hepatic function panel; Future -     VITAMIN D 25 Hydroxy (Vit-D Deficiency, Fractures); Future  Hypokalemia  Other orders -     acyclovir cream (ZOVIRAX) 5 %; Apply 1 application topically every 4 (four) hours.  I am having [name] maintain her Dorzolamide HCl-Timolol Mal (  COSOPT OP), HAIR/SKIN/NAILS, Hypertonic Nasal Wash (NASAFLO NETI POT NASAL WASH NA), valACYclovir, fluticasone, KLOR-CON/EF, losartan, mometasone, ciclopirox, amLODipine, ONE TOUCH ULTRA SYSTEM KIT, glucose blood, ONE TOUCH LANCETS, Omega 3, cyanocobalamin, Biotin, Nebivolol HCl, guaiFENesin, dorzolamide-timolol, and acyclovir cream.  Meds ordered this encounter  Medications  . dorzolamide-timolol (COSOPT) 22.3-6.8 MG/ML ophthalmic solution    Sig: daily.    Refill:  12  . acyclovir cream (ZOVIRAX) 5 %    Sig: Apply 1 application topically every 4 (four) hours.    Dispense:  15 g    Refill:  3     Follow-up: Return in about 4 months (around 06/21/2015) for a follow-up visit.  Walker Kehr, MD

## 2015-02-21 NOTE — Assessment & Plan Note (Signed)
Will sch w/Dr Amedeo Plenty

## 2015-02-22 LAB — PTH, INTACT AND CALCIUM
Calcium: 11.5 mg/dL — ABNORMAL HIGH (ref 8.4–10.5)
PTH: 2 pg/mL — ABNORMAL LOW (ref 14–64)

## 2015-02-23 ENCOUNTER — Telehealth: Payer: Self-pay | Admitting: Internal Medicine

## 2015-02-23 DIAGNOSIS — Z1211 Encounter for screening for malignant neoplasm of colon: Secondary | ICD-10-CM

## 2015-02-23 NOTE — Telephone Encounter (Signed)
GI ref made Labs - mailed Thx

## 2015-02-23 NOTE — Telephone Encounter (Signed)
Also, Please call back with lab results.

## 2015-02-23 NOTE — Telephone Encounter (Signed)
Patient states she does not want to be referred to Dr. Dwyane Dee.  She wants to be referred to Dr. Delrae Rend with Endocrinology.  [id number] Patient states as [name] that her GI doctor is Dr. Jenny Reichmann C. Amedeo Plenty on [address].

## 2015-02-28 ENCOUNTER — Telehealth: Payer: Self-pay | Admitting: Internal Medicine

## 2015-02-28 NOTE — Telephone Encounter (Signed)
Disregard

## 2015-02-28 NOTE — Telephone Encounter (Signed)
Can you please give her a call regarding her lab results

## 2015-03-01 NOTE — Telephone Encounter (Signed)
Left mess for patient to call back.  

## 2015-03-13 ENCOUNTER — Ambulatory Visit (INDEPENDENT_AMBULATORY_CARE_PROVIDER_SITE_OTHER): Payer: BC Managed Care – PPO | Admitting: Primary Care

## 2015-03-13 ENCOUNTER — Encounter: Payer: Self-pay | Admitting: Primary Care

## 2015-03-13 VITALS — BP 120/84 | HR 76 | Temp 98.4°F | Ht 60.0 in | Wt 133.4 lb

## 2015-03-13 DIAGNOSIS — I10 Essential (primary) hypertension: Secondary | ICD-10-CM

## 2015-03-13 NOTE — Patient Instructions (Signed)
You blood pressure in the office today was normal.  Your home blood pressure cuff could be off, so consider checking your BP at [facility], [facility], or [facility]. Compare these readings to your home readings.  Check your BP once daily in the afternoon to ensure your medications have taken effect. Ensure you have rested for 30 minutes prior to checking. Record your readings and notify your PCP if you continue to notice elevated readings.  Continue to take your medications as prescribed.   It was a pleasure meeting you!  Hypertension Hypertension, commonly called high blood pressure, is when the force of blood pumping through your arteries is too strong. Your arteries are the blood vessels that carry blood from your heart throughout your body. A blood pressure reading consists of a higher number over a lower number, such as 110/72. The higher number (systolic) is the pressure inside your arteries when your heart pumps. The lower number (diastolic) is the pressure inside your arteries when your heart relaxes. Ideally you want your blood pressure below 120/80. Hypertension forces your heart to work harder to pump blood. Your arteries may become narrow or stiff. Having untreated or uncontrolled hypertension can cause heart attack, stroke, kidney disease, and other problems. RISK FACTORS Some risk factors for high blood pressure are controllable. Others are not.  Risk factors you cannot control include:   Race. You may be at higher risk if you are African American.  Age. Risk increases with age.  Gender. Men are at higher risk than women before age 103 years. After age 47, women are at higher risk than men. Risk factors you can control include:  Not getting enough exercise or physical activity.  Being overweight.  Getting too much fat, sugar, calories, or salt in your diet.  Drinking too much alcohol. SIGNS AND SYMPTOMS Hypertension does not usually cause signs or symptoms. Extremely high  blood pressure (hypertensive crisis) may cause headache, anxiety, shortness of breath, and nosebleed. DIAGNOSIS To check if you have hypertension, your health care provider will measure your blood pressure while you are seated, with your arm held at the level of your heart. It should be measured at least twice using the same arm. Certain conditions can cause a difference in blood pressure between your right and left arms. A blood pressure reading that is higher than normal on one occasion does not mean that you need treatment. If it is not clear whether you have high blood pressure, you may be asked to return on a different day to have your blood pressure checked again. Or, you may be asked to monitor your blood pressure at home for 1 or more weeks. TREATMENT Treating high blood pressure includes making lifestyle changes and possibly taking medicine. Living a healthy lifestyle can help lower high blood pressure. You may need to change some of your habits. Lifestyle changes may include:  Following the DASH diet. This diet is high in fruits, vegetables, and whole grains. It is low in salt, red meat, and added sugars.  Keep your sodium intake below 2,300 mg per day.  Getting at least 30-45 minutes of aerobic exercise at least 4 times per week.  Losing weight if necessary.  Not smoking.  Limiting alcoholic beverages.  Learning ways to reduce stress. Your health care provider may prescribe medicine if lifestyle changes are not enough to get your blood pressure under control, and if one of the following is true:  You are 21-43 years of age and your systolic blood  pressure is above 140.  You are 15 years of age or older, and your systolic blood pressure is above 150.  Your diastolic blood pressure is above 90.  You have diabetes, and your systolic blood pressure is over [id number] or your diastolic blood pressure is over 90.  You have kidney disease and your blood pressure is above 140/90.  You  have heart disease and your blood pressure is above 140/90. Your personal target blood pressure may vary depending on your medical conditions, your age, and other factors. HOME CARE INSTRUCTIONS  Have your blood pressure rechecked as directed by your health care provider.   Take medicines only as directed by your health care provider. Follow the directions carefully. Blood pressure medicines must be taken as prescribed. The medicine does not work as well when you skip doses. Skipping doses also puts you at risk for problems.  Do not smoke.   Monitor your blood pressure at home as directed by your health care provider. SEEK MEDICAL CARE IF:   You think you are having a reaction to medicines taken.  You have recurrent headaches or feel dizzy.  You have swelling in your ankles.  You have trouble with your vision. SEEK IMMEDIATE MEDICAL CARE IF:  You develop a severe headache or confusion.  You have unusual weakness, numbness, or feel faint.  You have severe chest or abdominal pain.  You vomit repeatedly.  You have trouble breathing. MAKE SURE YOU:   Understand these instructions.  Will watch your condition.  Will get help right away if you are not doing well or get worse.   This information is not intended to replace advice given to you by your health care provider. Make sure you discuss any questions you have with your health care provider.   Document Released: 12/31/2004 Document Revised: 05/17/2014 Document Reviewed: 10/23/2012 Elsevier Interactive Patient Education [employer].

## 2015-03-13 NOTE — Progress Notes (Signed)
Subjective:    Patient ID: [name], female    DOB: 02/13/1960, 55 y.o.   MRN: [id number]  HPI  [name] is a 55 year old female who presents today with a chief complaint of hypertension. She is currently managed on Amlodipine 2.5 mg, bystolic 10 mg, and losartan 100 mg. She's noticed her BP fluctuating since 03/07/15. She's been checking her BP at home multiple times daily and has noticed elevated readings. Some readings were within 30 minutes to 1 hour after taking morning BP meds.  Her recent readings have been: Tuesday 03/07/15: 149/101, 128/97 later that day Wednesday 03/08/15: 147/101, 150/98 later that day, 144/97 later Thursday 03/09/15: 152/88, 154/100 Friday 03/10/15: 130/86 after medications Saturday: 146/101 HR of 118 Sunday: 146/99, took some Co-Q10 Monday morning: 132/81 after shower and BP meds.  She's noticed feeling fatigued since February 10th, did experience right arm discomfort last night which dissipated after ibuprofen. She endorses carrying a particularly heavy tote bag yesterday and believes she strained a muscle. Denies arm pain, SOB, chest pain, abdominal pain. BP stable during visit today.  BP Readings from Last 3 Encounters:  03/13/15 120/84  02/21/15 139/82  12/02/14 128/94     Review of Systems  Constitutional: Positive for fatigue.  Respiratory: Negative for shortness of breath.   Cardiovascular: Negative for chest pain.  Neurological: Negative for dizziness, weakness and headaches.       Past Medical History  Diagnosis Date  . Osteoporosis   . Hypertension   . Depression   . Glaucoma   . Pulmonary sarcoidosis ([place])   . Glaucoma   . Vitamin D deficiency   . Anxiety   . Asthma   . Depression   . Herpes simplex   . Osteoarthritis   . Allergy   . Cataract     Social History   Social History  . Marital Status: Divorced    Spouse Name: N/A  . Number of Children: N/A  . Years of Education: N/A   Occupational History  .  Not on file.   Social History Main Topics  . Smoking status: Never Smoker   . Smokeless tobacco: Never Used  . Alcohol Use: No  . Drug Use: No  . Sexual Activity: No   Other Topics Concern  . Not on file   Social History Narrative   Single/divorced; no children; she has a friend now   Never smoked   Positive history of passive tobacco smoke exposure.   Exercise- up to 4 times a week   Caffeine- 1-2 per week    Past Surgical History  Procedure Laterality Date  . Bunionectomy      x2  . Myomectomy    . Dental surgery    . Dilation and curettage of uterus    . Glaucoma surgery      Right eye    Family History  Problem Relation Age of Onset  . Cancer Mother     died at age 58-lung cancer, sarcoid  . Stroke Father   . Hypertension Father     Allergies  Allergen Reactions  . Amoxicillin-Pot Clavulanate     REACTION: GI symptoms  . Cardizem [Diltiazem Hcl]     HAs Pt can take Norvasc  . Carvedilol     REACTION: tired  . Cefdinir   . Cephalexin     REACTION: unspecified  . Hydrochlorothiazide     REACTION: unspecified  . Influenza Vaccines     Allergic to flu  vaccinations per pt.  Soreness and "cold symptoms" per pt  . Sulfamethoxazole     REACTION: unspecified  . Sulfonamide Derivatives   . Verapamil     fatigue    Current Outpatient Prescriptions on File Prior to Visit  Medication Sig Dispense Refill  . acyclovir cream (ZOVIRAX) 5 % Apply 1 application topically every 4 (four) hours. 15 g 3  . amLODipine (NORVASC) 5 MG tablet Take 1 tablet (5 mg total) by mouth daily. (Patient taking differently: Take 2.5 mg by mouth daily. ) 30 tablet 11  . Blood Glucose Monitoring Suppl (ONE TOUCH ULTRA SYSTEM KIT) W/DEVICE KIT 1 kit by Does not apply route as directed. 1 each 0  . Dorzolamide HCl-Timolol Mal (COSOPT OP) 1 drop once daily in left eye    . dorzolamide-timolol (COSOPT) 22.3-6.8 MG/ML ophthalmic solution daily.  12  . fluticasone (FLONASE) 50 MCG/ACT  nasal spray Place 1-2 sprays into the nose daily.     [name] glucose blood test strip 1 each by Other route as directed. 100 each 3  . Hypertonic Nasal Wash (NASAFLO NETI POT NASAL WASH NA) Place into the nose daily.     [name] KLOR-CON/EF 25 MEQ disintegrating tablet DISSOLVE ONE TABLET IN THE MOUTH TWICE A DAY 60 tablet 11  . losartan (COZAAR) 100 MG tablet Take 1 tablet (100 mg total) by mouth daily. 30 tablet 11  . Multiple Vitamins-Minerals (HAIR/SKIN/NAILS) TABS Take by mouth 2 (two) times daily.     . Nebivolol HCl (BYSTOLIC) 20 MG TABS 1/2 po qd 60 tablet 11  . Omega 3 1000 MG CAPS Take 2 capsules by mouth daily.    . ONE TOUCH LANCETS MISC 1 each by Does not apply route as directed. 100 each 3  . valACYclovir (VALTREX) 1000 MG tablet 1/2 tablet twice weekly    . ciclopirox (PENLAC) 8 % solution Apply topically at bedtime. Apply over nail and surrounding skin. Apply daily over previous coat. After seven (7) days, may remove with alcohol and continue cycle. (Patient not taking: Reported on 03/13/2015) 6.6 mL 0  . mometasone (ASMANEX 60 METERED DOSES) 220 MCG/INH inhaler Inhale 2 puffs into the lungs daily. (Patient not taking: Reported on 03/13/2015) 1 Inhaler 11   No current facility-administered medications on file prior to visit.    BP 120/84 mmHg  Pulse 76  Temp(Src) 98.4 F (36.9 C) (Oral)  Ht 5' (1.524 m)  Wt 133 lb 6.4 oz (60.51 kg)  BMI 26.05 kg/m2  SpO2 98%    Objective:   Physical Exam  Constitutional: She appears well-nourished.  Cardiovascular: Normal rate and regular rhythm.   Pulmonary/Chest: Effort normal and breath sounds normal.  Skin: Skin is warm and dry.          Assessment & Plan:

## 2015-03-13 NOTE — Progress Notes (Signed)
Pre visit review using our clinic review tool, if applicable. No additional management support is needed unless otherwise documented below in the visit note. 

## 2015-03-13 NOTE — Assessment & Plan Note (Addendum)
Recent elevated readings at home. BP manual check today in clinic WNL. Asymptomatic today in clinic. Exam unremarkable. Could be cuff, will have her check BP at pharmacy or fire station. She has been checking BP within 30 min to 1 hour of oral BP medications. Discussed when to measure BP, and to allow at least 30 minutes of rest prior to measurement.  She is to follow up with PCP for continued elevated readings or becomes symptomatic.  Continue current regimen.

## 2015-03-20 ENCOUNTER — Other Ambulatory Visit: Payer: Self-pay | Admitting: Internal Medicine

## 2015-03-20 DIAGNOSIS — E041 Nontoxic single thyroid nodule: Secondary | ICD-10-CM

## 2015-03-24 ENCOUNTER — Ambulatory Visit
Admission: RE | Admit: 2015-03-24 | Discharge: 2015-03-24 | Disposition: A | Discharge status: Home / Self Care | Payer: BC Managed Care – PPO | Source: Ambulatory Visit | Attending: Internal Medicine | Admitting: Internal Medicine

## 2015-03-24 DIAGNOSIS — E041 Nontoxic single thyroid nodule: Secondary | ICD-10-CM

## 2015-04-08 ENCOUNTER — Encounter: Payer: Self-pay | Admitting: Internal Medicine

## 2015-04-08 ENCOUNTER — Ambulatory Visit (INDEPENDENT_AMBULATORY_CARE_PROVIDER_SITE_OTHER): Payer: BC Managed Care – PPO | Admitting: Internal Medicine

## 2015-04-08 VITALS — BP 124/90 | HR 95 | Temp 99.7°F | Resp 16 | Wt 135.0 lb

## 2015-04-08 DIAGNOSIS — J0100 Acute maxillary sinusitis, unspecified: Secondary | ICD-10-CM | POA: Diagnosis not present

## 2015-04-08 MED ORDER — BENZONATATE 200 MG PO CAPS: 200.0000 mg | ORAL_CAPSULE | Freq: Two times a day (BID) | ORAL | Status: DC | PRN

## 2015-04-08 MED ORDER — HYDROCOD POLST-CPM POLST ER 10-8 MG/5ML PO SUER: 5.0000 mL | Freq: Two times a day (BID) | ORAL | Status: DC | PRN

## 2015-04-08 NOTE — Patient Instructions (Signed)

## 2015-04-08 NOTE — Progress Notes (Signed)
Pre visit review using our clinic review tool, if applicable. No additional management support is needed unless otherwise documented below in the visit note. 

## 2015-04-08 NOTE — Assessment & Plan Note (Signed)
Likely viral  No need for an antibiotic Tessalon as needed during the day tussionex cough syrup at night and during the day as long as she is not driving Rest, fluids otc cold meds if needed

## 2015-04-08 NOTE — Progress Notes (Signed)
Subjective:    Patient ID: [name], female    DOB: 27-Jan-1960, 55 y.o.   MRN: [id number]  HPI She is here for an acute visit for cold symptoms.   Her symptoms started two days ago.  She has nasal congestion with green mucus, cough that is productive at times, subjective low grade fever, chills, postnasal drip at night only and sinus pressure that she has felt a couple of times, but not present now.  She had a sore throat initially, but it has resolved.   She has taken tylenol two nights ago, one motrin yesterday and drank warm liquids.  She has been drinking a lot of water and has rested.   She starts a new job on Monday.    Medications and allergies reviewed with patient and updated if appropriate.  Patient Active Problem List   Diagnosis Date Noted  . Colon cancer screening 02/21/2015  . Abdominal pain, left lower quadrant 06/20/2014  . Glaucoma 05/31/2014  . Loose stools 05/31/2014  . Dislocation of finger PIP joint 01/03/2014  . Finger pain, right 12/28/2013  . B12 deficiency 12/15/2013  . Hypercalcemia 09/10/2013  . Creatinine elevation 09/10/2013  . Nonallopathic lesion of cervical region 06/11/2013  . Elevated LFTs 05/21/2013  . Greater trochanteric bursitis of right hip 05/14/2013  . Hypokalemia 04/12/2013  . Right acetabular fracture ([place]) 04/02/2013  . Acute right hip pain 03/31/2013  . Right sciatic nerve pain 02/26/2013  . Neck pain on right side 02/26/2013  . Exposure to the flu 01/25/2013  . Fall due to stumbling 07/24/2012  . Onychomycosis 04/27/2012  . Dizziness 01/14/2012  . Abdominal pain, left upper quadrant 09/30/2011  . Irritable bladder 09/02/2011  . Hyperglycemia 09/02/2011  . Nasal turbinate hypertrophy 03/08/2011  . Hypersomnia 09/12/2010  . VAGINITIS 02/27/2010  . Headache(784.0) 02/13/2010  . TMJ PAIN 10/20/2009  . GRIEF REACTION 02/23/2009  . PERIMENOPAUSAL SYNDROME 05/24/2008  . FATIGUE 12/07/2007  . TACHYCARDIA 12/07/2007  .  GANGLION CYST, WRIST, LEFT 07/17/2007  . 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 Allergic rhinitis 03/24/2007  . OSTEOARTHRITIS 03/24/2007  . HAND PAIN 03/24/2007  . TINEA PEDIS 12/10/2006  . Depression with anxiety 12/10/2006  . Asthma, moderate persistent 12/10/2006  . GLAUCOMA NOS 11/06/2006  . Essential hypertension 11/06/2006  . OSTEOPOROSIS 11/06/2006    Current Outpatient Prescriptions on File Prior to Visit  Medication Sig Dispense Refill  . amLODipine (NORVASC) 5 MG tablet Take 1 tablet (5 mg total) by mouth daily. (Patient taking differently: Take 2.5 mg by mouth daily. ) 30 tablet 11  . Blood Glucose Monitoring Suppl (ONE TOUCH ULTRA SYSTEM KIT) W/DEVICE KIT 1 kit by Does not apply route as directed. 1 each 0  . ciclopirox (PENLAC) 8 % solution Apply topically at bedtime. Apply over nail and surrounding skin. Apply daily over previous coat. After seven (7) days, may remove with alcohol and continue cycle. 6.6 mL 0  . Dorzolamide HCl-Timolol Mal (COSOPT OP) 1 drop once daily in left eye    . dorzolamide-timolol (COSOPT) 22.3-6.8 MG/ML ophthalmic solution daily.  12  . fluticasone (FLONASE) 50 MCG/ACT nasal spray Place 1-2 sprays into the nose daily.     [name] glucose blood test strip 1 each by Other route as directed. 100 each 3  . Hypertonic Nasal Wash (NASAFLO NETI POT NASAL WASH NA) Place into the nose daily.     [name] KLOR-CON/EF 25 MEQ disintegrating tablet DISSOLVE ONE TABLET IN THE MOUTH TWICE A DAY 60  tablet 11  . mometasone (ASMANEX 60 METERED DOSES) 220 MCG/INH inhaler Inhale 2 puffs into the lungs daily. 1 Inhaler 11  . Nebivolol HCl (BYSTOLIC) 20 MG TABS 1/2 po qd 60 tablet 11  . Omega 3 1000 MG CAPS Take 2 capsules by mouth daily.    . ONE TOUCH LANCETS MISC 1 each by Does not apply route as directed. 100 each 3  . valACYclovir (VALTREX) 1000 MG tablet 1/2 tablet twice weekly    . losartan (COZAAR) 100 MG tablet Take 1 tablet (100 mg total) by mouth daily. 30 tablet 11   No  current facility-administered medications on file prior to visit.    Past Medical History  Diagnosis Date  . Osteoporosis   . Hypertension   . Depression   . Glaucoma   . Pulmonary sarcoidosis ([facility])   . Glaucoma   . Vitamin D deficiency   . Anxiety   . Asthma   . Depression   . Herpes simplex   . Osteoarthritis   . Allergy   . Cataract     Past Surgical History  Procedure Laterality Date  . Bunionectomy      x2  . Myomectomy    . Dental surgery    . Dilation and curettage of uterus    . Glaucoma surgery      Right eye    Social History   Social History  . Marital Status: Divorced    Spouse Name: N/A  . Number of Children: N/A  . Years of Education: N/A   Social History Main Topics  . Smoking status: Never Smoker   . Smokeless tobacco: Never Used  . Alcohol Use: No  . Drug Use: No  . Sexual Activity: No   Other Topics Concern  . None   Social History Narrative   Single/divorced; no children; she has a friend now   Never smoked   Positive history of passive tobacco smoke exposure.   Exercise- up to 4 times a week   Caffeine- 1-2 per week    Family History  Problem Relation Age of Onset  . Cancer Mother     died at age 41-lung cancer, sarcoid  . Stroke Father   . Hypertension Father     Review of Systems  Constitutional: Positive for fever and chills. Negative for appetite change.  HENT: Positive for congestion (green mucux), postnasal drip (at night), sinus pressure (felt it twice, no pain now) and sore throat. Negative for ear pain.   Respiratory: Positive for cough (phlegm on occasion). Negative for shortness of breath and wheezing.   Cardiovascular: Negative for chest pain.  Gastrointestinal: Negative for nausea, abdominal pain and diarrhea.  Musculoskeletal: Negative for myalgias.  Neurological: Negative for dizziness, light-headedness and headaches.       Objective:   Filed Vitals:   04/08/15 1036  BP: 124/90  Pulse: 95  Temp: 99.7  F (37.6 C)  Resp: 16   Filed Weights   04/08/15 1036  Weight: 135 lb (61.236 kg)   Body mass index is 26.37 kg/(m^2).   Physical Exam GENERAL APPEARANCE: Appears stated age, well appearing, NAD EYES: conjunctiva clear, no icterus HEENT: bilateral tympanic membranes and ear canals normal, oropharynx with no erythema, no thyromegaly, trachea midline, no cervical or supraclavicular lymphadenopathy LUNGS: Clear to auscultation without wheeze or crackles, unlabored breathing, good air entry bilaterally HEART: Normal S1,S2 without murmurs EXTREMITIES: Without clubbing, cyanosis, or edema     Assessment & Plan:   See Problem   List for Assessment and Plan of chronic medical problems.  Call if no improvement

## 2015-04-10 ENCOUNTER — Telehealth: Payer: Self-pay | Admitting: Internal Medicine

## 2015-04-10 NOTE — Telephone Encounter (Signed)
[facility]  Patient Name: [name]  DOB: 11-10-1960    Initial Comment Caller stated she has questions about mucus clots developing on her lungs due to drainage from her nasal.    Nurse Assessment  Nurse: Justine Null, RN, [name] Date/Time (Eastern Time): 04/10/2015 4:30:48 PM  Confirm and document reason for call. If symptomatic, describe symptoms. You must click the next button to save text entered. ---Caller stated she has questions about mucus clots developing on her lungs due to drainage from her nasal. caller stated that she has been using the cough syrup and has been having the mucus clots in her sinus and has been having a viral sinus infection and has had no fevers today but did 100.4 per orally and has no breathing difficulties  Has the patient traveled out of the country within the last 30 days? ---No  Does the patient have any new or worsening symptoms? ---Yes  Will a triage be completed? ---Yes  Related visit to physician within the last 2 weeks? ---Yes  Does the PT have any chronic conditions? (i.e. diabetes, asthma, etc.) ---Yes  List chronic conditions. ---asthma sardonisis HTN  Is this a behavioral health or substance abuse call? ---No     Guidelines    Guideline Title Affirmed Question Affirmed Notes  Common Cold Cold with no complications (all triage questions negative)    Final Disposition User   [place], RN, [name]    Disagree/Comply: Comply

## 2015-04-11 ENCOUNTER — Telehealth: Payer: Self-pay | Admitting: Pulmonary Disease

## 2015-04-11 NOTE — Telephone Encounter (Signed)
Called and spoke with pt. She states that she has been diagnosed with a respiratory infection and states that she would like to postpone PFT due to symptoms. I rescheduled her PFT for 05/31/15. I offered to reschedule her ov with BQ but she states she would like to keep this appointment to meet BQ. She voiced understanding and had no further questions. Nothing further needed at this time.

## 2015-04-11 NOTE — Telephone Encounter (Signed)
Appointment is Thursday.[name]

## 2015-04-13 ENCOUNTER — Ambulatory Visit (INDEPENDENT_AMBULATORY_CARE_PROVIDER_SITE_OTHER): Payer: BC Managed Care – PPO | Admitting: Pulmonary Disease

## 2015-04-13 ENCOUNTER — Encounter: Payer: Self-pay | Admitting: Pulmonary Disease

## 2015-04-13 ENCOUNTER — Ambulatory Visit (INDEPENDENT_AMBULATORY_CARE_PROVIDER_SITE_OTHER)
Admission: RE | Admit: 2015-04-13 | Discharge: 2015-04-13 | Disposition: A | Discharge status: Home / Self Care | Payer: BC Managed Care – PPO | Source: Ambulatory Visit | Attending: Pulmonary Disease | Admitting: Pulmonary Disease

## 2015-04-13 VITALS — BP 126/76 | HR 83 | Ht 60.0 in | Wt 134.0 lb

## 2015-04-13 DIAGNOSIS — J4541 Moderate persistent asthma with (acute) exacerbation: Secondary | ICD-10-CM | POA: Diagnosis not present

## 2015-04-13 DIAGNOSIS — R059 Cough, unspecified: Secondary | ICD-10-CM

## 2015-04-13 DIAGNOSIS — R05 Cough: Secondary | ICD-10-CM

## 2015-04-13 DIAGNOSIS — J0100 Acute maxillary sinusitis, unspecified: Secondary | ICD-10-CM | POA: Diagnosis not present

## 2015-04-13 DIAGNOSIS — D869 Sarcoidosis, unspecified: Secondary | ICD-10-CM

## 2015-04-13 MED ORDER — AZITHROMYCIN 250 MG PO TABS: ORAL_TABLET | ORAL | Status: DC

## 2015-04-13 MED ORDER — AEROCHAMBER MV MISC: Status: DC

## 2015-04-13 NOTE — Assessment & Plan Note (Signed)
I'm concerned that her sarcoid may be progressing as she notes worsening shortness of breath over the last year. This could be due to worsening pulmonary parenchymal disease, airways disease or were she had pulmonary hypertension. I think she needs a full evaluation of this but right now because she is acutely ill with a respiratory infection we need to put that off for a few weeks.  Plan: Repeat PFT in 6 weeks Consider echocardiogram Chest x-ray today

## 2015-04-13 NOTE — Patient Instructions (Signed)
Take the Z-Pak as prescribed Keep using your saline rinses as you're doing Stop Asmanex Start taking Symbicort 2 puffs twice a day with a spacer We will call you with the results of the chest x-ray We will see you back in 6 weeks or sooner if needed

## 2015-04-13 NOTE — Assessment & Plan Note (Signed)
Her symptoms have been progressing in the last week and now she has some shortness of breath associated with the cough. She has had significant purulent mucus production and headache and fever and chills in the last few days so think it's reasonable to treat her with an antibiotic.  Plan: Continue saline rinses Z-Pak

## 2015-04-13 NOTE — Assessment & Plan Note (Signed)
She has been having worsening respiratory symptoms despite consistently using an inhaled corticosteroid. Again, this may be due to her sarcoid progressing but considering the recent respiratory infection and the shortness of breath and cough she's been experiencing we will treat this as if it's worsening asthma control.  Plan: Stop Asmanex Start Symbicort 2 puffs twice a day with a spacer, sample provided

## 2015-04-13 NOTE — Progress Notes (Signed)
Subjective:    Patient ID: [name], female    DOB: Nov 26, 1960, 55 y.o.   MRN: [id number]  Synopsis: Former patient of Dr. Joya Gaskins who has sarcoidosis. She also has asthma. June 2014 pulmonary function testing ratio 77%, FEV1 1.99 L, 104% predicted), FVC 2.60 L, 108% predicted, total lung capacity 3.75 L (84% predicted), DLCO 20.0 (106% predicted) She was diagnosed with a skin biopsy in 1983 when she had a rash.   HPI  Chief Complaint  Patient presents with  . Follow-up    former PW pt being treated for sarcoidosis.  Pt saw PCP this past week for sinus infection.      [name] is here to see me for follow up from her sarcoidosis.  IN 1983 she had a skin biopsy and in the 1990's she was noted to have pulmonary involvement on a CXR. She hasn't had too many severe respiratory problems which she directly attributes to the sarcoid.  She has had pneumonia and influenza in the last few years. She has never needed oxygen. She takes Asmanex regularly but feels that it doesn't always provide reliable drug delivery.   She has been diagnosed with a sinus infection recently.  She says that she has a dull headache and she has a lot of thick mucus which is bloody, brown and green.  This has been going on since last week.  She had a fever and chills on Monday.  She has noted a fever to 100.4.  She is uncertain about direct sick contacts but some coworkers have had this problem.    She has some baseline shortness of breath that may have worsened since June of last year.   She notes a constant cough as well. Typically dry.  Past Medical History  Diagnosis Date  . Osteoporosis   . Hypertension   . Depression   . Glaucoma   . Pulmonary sarcoidosis ([facility])   . Glaucoma   . Vitamin D deficiency   . Anxiety   . Asthma   . Depression   . Herpes simplex   . Osteoarthritis   . Allergy   . Cataract      Review of Systems  Constitutional: Positive for fever and chills. Negative for fatigue.    HENT: Positive for nosebleeds, rhinorrhea, sinus pressure and sneezing. Negative for postnasal drip.   Respiratory: Positive for cough. Negative for shortness of breath and wheezing.   Cardiovascular: Negative for chest pain, palpitations and leg swelling.       Objective:   Physical Exam Filed Vitals:   04/13/15 1403  BP: 126/76  Pulse: 83  Height: 5' (1.524 m)  Weight: 134 lb (60.782 kg)  SpO2: 99%   RA   Gen: well appearing HENT: OP clear, TM's clear, neck supple PULM: Crackles upper lobes bilaterally, normal percussion CV: RRR, no mgr, some JVD elevation noted, no edema GI: BS+, soft, nontender Derm: no cyanosis or rash Psyche: normal mood and affect  CXR images from 2016 reviewed: There is significant upper lobe scarring which radiology feels has progressed compared to the prior study  Prior records from my partner reviewed where she was treated with inhaled steroids for asthma and observed for her sarcoid.     Assessment & Plan:  SARCOIDOSIS, PULMONARY I'm concerned that her sarcoid may be progressing as she notes worsening shortness of breath over the last year. This could be due to worsening pulmonary parenchymal disease, airways disease or were she had pulmonary hypertension. I  think she needs a full evaluation of this but right now because she is acutely ill with a respiratory infection we need to put that off for a few weeks.  Plan: Repeat PFT in 6 weeks Consider echocardiogram Chest x-ray today  Asthma, moderate persistent She has been having worsening respiratory symptoms despite consistently using an inhaled corticosteroid. Again, this may be due to her sarcoid progressing but considering the recent respiratory infection and the shortness of breath and cough she's been experiencing we will treat this as if it's worsening asthma control.  Plan: Stop Asmanex Start Symbicort 2 puffs twice a day with a spacer, sample provided  Acute maxillary sinusitis Her  symptoms have been progressing in the last week and now she has some shortness of breath associated with the cough. She has had significant purulent mucus production and headache and fever and chills in the last few days so think it's reasonable to treat her with an antibiotic.  Plan: Continue saline rinses Z-Pak     Current outpatient prescriptions:  .  amLODipine (NORVASC) 5 MG tablet, Take 1 tablet (5 mg total) by mouth daily. (Patient taking differently: Take 2.5 mg by mouth daily. ), Disp: 30 tablet, Rfl: 11 .  aspirin 81 MG tablet, Take 81 mg by mouth daily., Disp: , Rfl:  .  benzonatate (TESSALON) 200 MG capsule, Take 1 capsule (200 mg total) by mouth 2 (two) times daily as needed for cough., Disp: 20 capsule, Rfl: 0 .  Blood Glucose Monitoring Suppl (ONE TOUCH ULTRA SYSTEM KIT) W/DEVICE KIT, 1 kit by Does not apply route as directed., Disp: 1 each, Rfl: 0 .  chlorpheniramine-HYDROcodone (TUSSIONEX PENNKINETIC ER) 10-8 MG/5ML SUER, Take 5 mLs by mouth every 12 (twelve) hours as needed for cough., Disp: 100 mL, Rfl: 0 .  ciclopirox (PENLAC) 8 % solution, Apply topically at bedtime. Apply over nail and surrounding skin. Apply daily over previous coat. After seven (7) days, may remove with alcohol and continue cycle., Disp: 6.6 mL, Rfl: 0 .  Dorzolamide HCl-Timolol Mal (COSOPT OP), 1 drop once daily in left eye, Disp: , Rfl:  .  dorzolamide-timolol (COSOPT) 22.3-6.8 MG/ML ophthalmic solution, daily., Disp: , Rfl: 12 .  fluticasone (FLONASE) 50 MCG/ACT nasal spray, Place 1-2 sprays into the nose daily. , Disp: , Rfl:  .  glucose blood test strip, 1 each by Other route as directed., Disp: 100 each, Rfl: 3 .  Hypertonic Nasal Wash (NASAFLO NETI POT NASAL WASH NA), Place into the nose daily. , Disp: , Rfl:  .  KLOR-CON/EF 25 MEQ disintegrating tablet, DISSOLVE ONE TABLET IN THE MOUTH TWICE A DAY, Disp: 60 tablet, Rfl: 11 .  mometasone (ASMANEX 60 METERED DOSES) 220 MCG/INH inhaler, Inhale 2  puffs into the lungs daily., Disp: 1 Inhaler, Rfl: 11 .  Nebivolol HCl (BYSTOLIC) 20 MG TABS, 1/2 po qd, Disp: 60 tablet, Rfl: 11 .  Omega 3 1000 MG CAPS, Take 2 capsules by mouth daily., Disp: , Rfl:  .  ONE TOUCH LANCETS MISC, 1 each by Does not apply route as directed., Disp: 100 each, Rfl: 3 .  valACYclovir (VALTREX) 1000 MG tablet, 1/2 tablet twice weekly, Disp: , Rfl:  .  azithromycin (ZITHROMAX Z-PAK) 250 MG tablet, Take 2 tablets today, then 1 daily until gone., Disp: 6 each, Rfl: 0 .  losartan (COZAAR) 100 MG tablet, Take 1 tablet (100 mg total) by mouth daily., Disp: 30 tablet, Rfl: 11 .  Spacer/Aero-Holding Chambers (AEROCHAMBER MV) inhaler, Use as instructed,  Disp: 1 each, Rfl: 0

## 2015-04-14 ENCOUNTER — Telehealth: Payer: Self-pay | Admitting: Pulmonary Disease

## 2015-04-14 MED ORDER — FLUCONAZOLE 100 MG PO TABS: 100.0000 mg | ORAL_TABLET | Freq: Every day | ORAL | Status: DC

## 2015-04-14 NOTE — Telephone Encounter (Signed)
lmomtcb x1 for pt 

## 2015-04-14 NOTE — Telephone Encounter (Signed)
Yes  Diflucan 100mg  po daily x3 days

## 2015-04-14 NOTE — Telephone Encounter (Signed)
Spoke with pt. She is requesting that we send in a prescription for Diflucan. States that she has a yeast infection that she told BQ about yesterday. This is coming from an antibiotic that was given to her a few days ago.  BQ - please advise if you would be willing to send this in. Thanks.

## 2015-04-17 NOTE — Telephone Encounter (Signed)
lmtcb X2 for pt to make sure she's aware that rx was sent to pharmacy on Friday.

## 2015-04-18 NOTE — Telephone Encounter (Signed)
Pt cb states she received rx and has been taking it since Friday. Nothing further needed

## 2015-05-02 ENCOUNTER — Telehealth: Payer: Self-pay | Admitting: Gastroenterology

## 2015-05-02 ENCOUNTER — Telehealth: Payer: Self-pay | Admitting: Pulmonary Disease

## 2015-05-02 MED ORDER — BUDESONIDE-FORMOTEROL FUMARATE 160-4.5 MCG/ACT IN AERO: 2.0000 | INHALATION_SPRAY | Freq: Every day | RESPIRATORY_TRACT | Status: DC

## 2015-05-02 NOTE — Telephone Encounter (Signed)
Pt states she is not using Symbicort at night due to keeping her up night-she continues to use during the day without any issues and would like to have Rx sent. BQ please advise on refill and sig. Thanks.    Pharmacy: [place], [place]

## 2015-05-02 NOTE — Telephone Encounter (Signed)
LMTCB

## 2015-05-02 NOTE — Telephone Encounter (Signed)
OK to change to daily

## 2015-05-02 NOTE — Telephone Encounter (Signed)
lmtcb X1 for pt. symbicort sent to preferred pharmacy.

## 2015-05-03 MED ORDER — MOMETASONE FURO-FORMOTEROL FUM 100-5 MCG/ACT IN AERO: 2.0000 | INHALATION_SPRAY | Freq: Every day | RESPIRATORY_TRACT | Status: DC

## 2015-05-03 NOTE — Telephone Encounter (Signed)
I would be happy with her taking Dulera 2 puffs daily, 100/4.5 dose

## 2015-05-03 NOTE — Telephone Encounter (Signed)
Spoke with pt.  Discussed below.  Pt verbalized understanding.   Pt states she spoke with [facility] yesterday and was advised Symbicort is not covered by insurance.  Reports [facility] was going to send fax regarding this.  I do not see fax. Called [facility] in [place], spoke with Ed.  States fax was sent this morning.  No preferred meds given.  Number for PA is [phone]. Called number provided.  This is to [facility], spoke with Vania Rea.  Was advised preferred meds for Symbicort are Advair and Dulera.  I do not see either listed on pt's past med list.  Dr. Lake Bells, please advise if Symbicort can be changed or if you would like to proceed with PA.  Thank you.

## 2015-05-03 NOTE — Telephone Encounter (Signed)
Pt is aware of medication change. Rx has been sent in. Nothing further was needed. 

## 2015-05-09 NOTE — Telephone Encounter (Signed)
Patient states that she has not had a colon done by [facility]. She states last colon was by Dr. Deatra Ina over 10 years ago. Patient will call back when July schedule is available.

## 2015-05-09 NOTE — Telephone Encounter (Signed)
Per [employer] at [place], patient has not had a colonoscopy done by [facility].

## 2015-05-09 NOTE — Telephone Encounter (Signed)
Left message for patient to return my call. We need to know when her last colonoscopy with Dr. Amedeo Plenty was? We need records if less than 10 years. If more than 10 years then it is ok to schedule Direct Colon with Dr. Fuller Plan.

## 2015-05-15 ENCOUNTER — Encounter: Payer: Self-pay | Admitting: Gastroenterology

## 2015-05-15 ENCOUNTER — Encounter: Payer: Self-pay | Admitting: Dietician

## 2015-05-15 ENCOUNTER — Encounter: Payer: BC Managed Care – PPO | Attending: Internal Medicine | Admitting: Dietician

## 2015-05-15 DIAGNOSIS — N289 Disorder of kidney and ureter, unspecified: Secondary | ICD-10-CM

## 2015-05-15 DIAGNOSIS — Z713 Dietary counseling and surveillance: Secondary | ICD-10-CM | POA: Insufficient documentation

## 2015-05-15 DIAGNOSIS — D869 Sarcoidosis, unspecified: Secondary | ICD-10-CM | POA: Insufficient documentation

## 2015-05-15 DIAGNOSIS — R944 Abnormal results of kidney function studies: Secondary | ICD-10-CM

## 2015-05-15 NOTE — Progress Notes (Addendum)
  Medical Nutrition Therapy:  Appt start time: 1400 end time:  [id number].   Assessment:  Primary concerns today: Patient is here today alone.  She wants to learn more about nutrition to improve her lab work.  She has a copy of he most recent labs 04/24/15.  Her calcium improved from 12.8 to 11.1 and her GFR improved from 35 to 45.  She has been drinking >2L of fluid per day, limiting green leafy vegetables, and calcium containing foods.  Patient of Dr. Buddy Duty diagnosed with hypercalcemia associated with sarcoidosis.  MD recommendations:  Increase fluid intake to keep urine output ?2000 ml pe day, avoid vitamin D supplements, calcium intake <400 mg per day, and consider eating less than 100 mg of oxalate per day due to risk of kidney stones.  Weight today has been relatively stable at 132 lbs.  Patient lives alone.  Patient shops and cooks for herself and eats out frequently. She works for [employer] and states that it is much less stressful than the job that she retired from.  Preferred Learning Style:   No preference indicated   Learning Readiness:   Ready  Change in progress  MEDICATIONS: see lsit   DIETARY INTAKE: She has cut back on greens, leafy vegetables, and cheese and has stopped eating yogurt.  24-hr recall:  B ( AM): Peanut butter and honey sandwich on [employer] bread, Mullin tea  Snk ( AM): Neko crackers, coffee with coffee mate and small amount sugar, 2 clemont[place] L ( PM): none today.  Chick fil-A chicken salad sandwich or chinese chicken drummets with rice and vegetables, or Sub way (6") occasionally with cheese Snk ( PM): nuts/sunflower seeds D ( PM): leftovers- chicken and rice, green beans, lima beans Snk ( PM): occasionally nuts or dried apricots Beverages: water 72 ounces per day average, 8 ounces coke once per week  Usual physical activity: walk in the mall or at gym 2 days per week and 2-3 pound weights. 2 days per week (20-30 minutes each)  Estimated  energy needs: 1600 calories 50 g protein  Progress Towards Goal(s):  In progress.   Nutritional Diagnosis:  NB-1.1 Food and nutrition-related knowledge deficit As related to recommendations for hypercalcemia.  As evidenced by patient report.    Intervention:  Nutrition education related to MD guidelines for hypercalcemia and to reduce the formation of kidney stones.  Patient's questions were answered.   Great job on your fluid intake!  Continue water intake of 2 liters or more per day.  Limit calcium to less than 400 mg per day.  Avoid milk and limit cheese to 1 ounce per day.  Avoid fortified cereal. Avoid Spinach, beets, potato chips, french fries and limit to small portions nuts. Consider a probiotic daily. Continue Omega-3 No vitamin D supplement and avoid sun per Dr. Buddy Duty.  Teaching Method Utilized:  Visual Auditory Hands on  Handouts given during visit include:  Kidney stones and diet  Sources of calcium  Barriers to learning/adherence to lifestyle change: none  Demonstrated degree of understanding via:  Teach Back   Monitoring/Evaluation:  Dietary intake, exercise, and body weight prn.

## 2015-05-15 NOTE — Patient Instructions (Signed)
Great job on your fluid intake!  Continue water intake of 2 liters or more per day. Limit calcium to less than 400 mg per day.  Avoid milk and limit cheese to 1 ounce per day.  Avoid fortified cereal. Avoid Spinach, beets, potato chips, french fries and limit to small portions nuts. Consider a probiotic daily. Continue Omega-3 No vitamin D supplement and avoid sun per Dr. Buddy Duty.

## 2015-05-31 ENCOUNTER — Ambulatory Visit (INDEPENDENT_AMBULATORY_CARE_PROVIDER_SITE_OTHER): Payer: BC Managed Care – PPO | Admitting: Pulmonary Disease

## 2015-05-31 DIAGNOSIS — D869 Sarcoidosis, unspecified: Secondary | ICD-10-CM | POA: Diagnosis not present

## 2015-05-31 LAB — PULMONARY FUNCTION TEST
DL/VA % pred: 112 %
DL/VA: 4.78 ml/min/mmHg/L
DLCO COR % PRED: 77 %
DLCO UNC: 15.41 ml/min/mmHg
DLCO cor: 14.58 ml/min/mmHg
DLCO unc % pred: 81 %
FEF 25-75 POST: 0.77 L/s
FEF 25-75 Pre: 0.74 L/sec
FEF2575-%Change-Post: 4 %
FEF2575-%PRED-PRE: 37 %
FEF2575-%Pred-Post: 39 %
FEV1-%Change-Post: 1 %
FEV1-%PRED-PRE: 69 %
FEV1-%Pred-Post: 70 %
FEV1-POST: 1.3 L
FEV1-PRE: 1.28 L
FEV1FVC-%Change-Post: 0 %
FEV1FVC-%PRED-PRE: 82 %
FEV6-%Change-Post: 1 %
FEV6-%PRED-POST: 86 %
FEV6-%PRED-PRE: 85 %
FEV6-POST: 1.96 L
FEV6-Pre: 1.93 L
FEV6FVC-%CHANGE-POST: 0 %
FEV6FVC-%PRED-POST: 102 %
FEV6FVC-%Pred-Pre: 102 %
FVC-%CHANGE-POST: 1 %
FVC-%PRED-POST: 84 %
FVC-%Pred-Pre: 83 %
FVC-Post: 1.96 L
FVC-Pre: 1.94 L
POST FEV6/FVC RATIO: 100 %
PRE FEV1/FVC RATIO: 66 %
PRE FEV6/FVC RATIO: 100 %
Post FEV1/FVC ratio: 66 %
RV % pred: 82 %
RV: 1.41 L
TLC % PRED: 78 %
TLC: 3.49 L

## 2015-05-31 NOTE — Progress Notes (Signed)
PFT done today. 

## 2015-06-01 ENCOUNTER — Encounter: Payer: Self-pay | Admitting: Pulmonary Disease

## 2015-06-01 ENCOUNTER — Ambulatory Visit (INDEPENDENT_AMBULATORY_CARE_PROVIDER_SITE_OTHER): Payer: BC Managed Care – PPO | Admitting: Pulmonary Disease

## 2015-06-01 VITALS — BP 132/88 | HR 83 | Ht 60.25 in | Wt 131.4 lb

## 2015-06-01 DIAGNOSIS — J4541 Moderate persistent asthma with (acute) exacerbation: Secondary | ICD-10-CM | POA: Diagnosis not present

## 2015-06-01 DIAGNOSIS — J0100 Acute maxillary sinusitis, unspecified: Secondary | ICD-10-CM

## 2015-06-01 DIAGNOSIS — D869 Sarcoidosis, unspecified: Secondary | ICD-10-CM | POA: Diagnosis not present

## 2015-06-01 NOTE — Patient Instructions (Addendum)
It is nice to meet you today. I am glad you are feeling better. Consider adding loratadine 10 mg once daily for allergies. Add Flonase 2 sprays up each nostril daily. Consider taking the pneumovax vaccine. Follow up with Dr. Lake Bells in 6 months. Please contact office for sooner follow up if symptoms do not improve or worsen or seek emergency care

## 2015-06-01 NOTE — Assessment & Plan Note (Signed)
Resolved with Zpack Plan: Continue saline rinses as needed Add Claritin 10 mg once daily Please contact office for sooner follow up if symptoms do not improve or worsen or seek emergency care

## 2015-06-01 NOTE — Progress Notes (Signed)
History of Present Illness [name] is a 55 y.o. female with pulmonary Sarcoidosis and asthma. She is a former Dr. Joya Gaskins patient now followed by Dr. Lake Bells.   06/01/2015  6 Week Follow Up: Pt. Presents to the office today for follow up of sinus infection and cough. She completed her z pack and has no complaints of fever, purulent secretions, chest pain, orthopnea or hemoptysis.She had her last eye exam in January. She goes every 4-5 months.Her eye doctor is Dr. Marylynn Pearson.She is doing well on the [facility], and is compliant. She does not have a rescue inhaler.She says she does not think she can afford any other medications.She does not take the flu vaccine. She states she is allergic to it. She does not want to take the pneumovax vaccine   Tests May 2017: PFT's:ratio  Past medical hx Past Medical History  Diagnosis Date  . Osteoporosis   . Hypertension   . Depression   . Glaucoma   . Pulmonary sarcoidosis ([place])   . Glaucoma   . Vitamin D deficiency   . Anxiety   . Asthma   . Depression   . Herpes simplex   . Osteoarthritis   . Allergy   . Cataract      Past surgical hx, Family hx, Social hx all reviewed.  Current Outpatient Prescriptions on File Prior to Visit  Medication Sig  . amLODipine (NORVASC) 5 MG tablet Take 1 tablet (5 mg total) by mouth daily. (Patient taking differently: Take 2.5 mg by mouth daily. )  . aspirin 81 MG tablet Take 81 mg by mouth daily.  . Blood Glucose Monitoring Suppl (ONE TOUCH ULTRA SYSTEM KIT) W/DEVICE KIT 1 kit by Does not apply route as directed.  . chlorpheniramine-HYDROcodone (TUSSIONEX PENNKINETIC ER) 10-8 MG/5ML SUER Take 5 mLs by mouth every 12 (twelve) hours as needed for cough.  . dorzolamide-timolol (COSOPT) 22.3-6.8 MG/ML ophthalmic solution daily.  . fluconazole (DIFLUCAN) 100 MG tablet Take 1 tablet (100 mg total) by mouth daily.  . fluticasone (FLONASE) 50 MCG/ACT nasal spray Place 1-2 sprays into the nose daily. Reported  on 05/15/2015  . glucose blood test strip 1 each by Other route as directed.  . Hypertonic Nasal Wash (NASAFLO NETI POT NASAL WASH NA) Place into the nose daily.   [name] KLOR-CON/EF 25 MEQ disintegrating tablet DISSOLVE ONE TABLET IN THE MOUTH TWICE A DAY  . losartan (COZAAR) 100 MG tablet Take 1 tablet (100 mg total) by mouth daily.  . mometasone-formoterol (DULERA) 100-5 MCG/ACT AERO Inhale 2 puffs into the lungs daily.  . Nebivolol HCl (BYSTOLIC) 20 MG TABS 1/2 po qd  . Omega 3 1000 MG CAPS Take 2 capsules by mouth daily.  . ONE TOUCH LANCETS MISC 1 each by Does not apply route as directed.  . valACYclovir (VALTREX) 1000 MG tablet Reported on 05/15/2015  . vitamin B-12 (CYANOCOBALAMIN) 100 MCG tablet Take 100 mcg by mouth daily.   No current facility-administered medications on file prior to visit.     Allergies  Allergen Reactions  . Amoxicillin-Pot Clavulanate     REACTION: GI symptoms  . Cardizem [Diltiazem Hcl]     HAs Pt can take Norvasc  . Carvedilol     REACTION: tired  . Cefdinir   . Cephalexin     REACTION: unspecified  . Hydrochlorothiazide     REACTION: unspecified  . Influenza Vaccines     Allergic to flu vaccinations per pt.  Soreness and "cold symptoms" per pt  .  Prednisone Hypertension  . Sulfamethoxazole     REACTION: unspecified  . Sulfonamide Derivatives   . Verapamil     fatigue    Review Of Systems:  Constitutional:   No  weight loss, night sweats,  Fevers, chills, fatigue, or  lassitude.  HEENT:   No headaches,  Difficulty swallowing,  Tooth/dental problems, or  Sore throat,                No sneezing, itching, ear ache, nasal congestion, post nasal drip,   CV:  No chest pain,  Orthopnea, PND, swelling in lower extremities, anasarca, dizziness, palpitations, syncope.   GI  No heartburn, indigestion, abdominal pain, nausea, vomiting, diarrhea, change in bowel habits, loss of appetite, bloody stools.   Resp: No shortness of breath with exertion or at  rest.  No excess mucus, no productive cough,  No non-productive cough,  No coughing up of blood.  No change in color of mucus.  No wheezing.  No chest wall deformity  Skin: no rash or lesions.  GU: no dysuria, change in color of urine, no urgency or frequency.  No flank pain, no hematuria   MS:  No joint pain or swelling.  No decreased range of motion.  No back pain.  Psych:  No change in mood or affect. No depression or anxiety.  No memory loss.   Vital Signs BP 132/88 mmHg  Pulse 83  Ht 5' 0.25" (1.53 m)  Wt 131 lb 6.4 oz (59.603 kg)  BMI 25.46 kg/m2  SpO2 97%   Physical Exam:  General- No distress,  A&Ox3 ENT: No sinus tenderness, TM clear, pale nasal mucosa, no oral exudate,no post nasal drip, no LAN Cardiac: S1, S2, regular rate and rhythm, no murmur Chest: No wheeze/ rales/ dullness; no accessory muscle use, no nasal flaring, no sternal retractions Abd.: Soft Non-tender Ext: No clubbing cyanosis, edema Neuro:  normal strength Skin: No rashes, warm and dry Psych: normal mood and behavior   Assessment/Plan  SARCOIDOSIS, PULMONARY PFT's indicate some  progression of disease/ worsening airflow obstruction.. Plan Continue Dulera  Consider adding loratadine 10 mg once daily for allergies. Consider taking the pneumovax vaccine. Follow up with Dr. Lake Bells in 6 months. Please contact office for sooner follow up if symptoms do not improve or worsen or seek emergency care :     Acute maxillary sinusitis Resolved with Zpack Plan: Continue saline rinses as needed Add Claritin 10 mg once daily Please contact office for sooner follow up if symptoms do not improve or worsen or seek emergency care      Magdalen Spatz, NP 06/01/2015  4:00 PM   Attending:  I have seen and examined the patient with nurse practitioner/resident and agree with the note above.  We formulated the plan together and I elicited the following history.    I have independently seen and examined  [name]. She tells me today that she has no shortness of breath but she continues to have a cough. She stopped taking her allergic rhinitis medication. She continues to take Symbicort. She does not produce mucus. Again, she tells me that she has no shortness of breath.  On exam: Lungs are clear to auscultation, however there was a small wheeze anteriorly on the left Cardiovascular regular rate and rhythm no murmurs gallops rubs no JVD  Her pulmonary function testing personally reviewed today showing FEV1 1.30 L, ratio 66% with clear airflow obstruction, FVC 1.96 L  a DLCO 15.4, total lung capacity slightly  declined at 3.49,   Given the fact that the most notable change in her pulmonary function testing is airflow obstruction and hopeful that this is more of an asthma issue than progression of sarcoidosis. Her last chest x-ray in March did not show evidence of progression of pulmonary infiltrates.  Plan: Treat cough by adding a spacer to help her get better delivery of her inhaled corticosteroid and bronchodilator, also had treatment for allergic rhinitis with a nasal steroid Increase exercise as much as possible Follow-up 6 months, if progression of symptoms then check echocardiogram at that time  Roselie Awkward, MD [place] [facility] Pager: [phone] Cell: [phone] After 3pm or if no response, call [phone]

## 2015-06-01 NOTE — Assessment & Plan Note (Signed)
PFT's indicate some  progression of disease/ worsening airflow obstruction.. Plan Continue Dulera  Consider adding loratadine 10 mg once daily for allergies. Consider taking the pneumovax vaccine. Follow up with Dr. Lake Bells in 6 months. Please contact office for sooner follow up if symptoms do not improve or worsen or seek emergency care :

## 2015-06-20 ENCOUNTER — Ambulatory Visit (INDEPENDENT_AMBULATORY_CARE_PROVIDER_SITE_OTHER): Payer: BC Managed Care – PPO | Admitting: Internal Medicine

## 2015-06-20 ENCOUNTER — Encounter: Payer: Self-pay | Admitting: Internal Medicine

## 2015-06-20 VITALS — BP 125/84 | HR 105 | Wt 129.0 lb

## 2015-06-20 DIAGNOSIS — E538 Deficiency of other specified B group vitamins: Secondary | ICD-10-CM | POA: Diagnosis not present

## 2015-06-20 DIAGNOSIS — E876 Hypokalemia: Secondary | ICD-10-CM

## 2015-06-20 DIAGNOSIS — D869 Sarcoidosis, unspecified: Secondary | ICD-10-CM

## 2015-06-20 DIAGNOSIS — I10 Essential (primary) hypertension: Secondary | ICD-10-CM | POA: Diagnosis not present

## 2015-06-20 DIAGNOSIS — F418 Other specified anxiety disorders: Secondary | ICD-10-CM

## 2015-06-20 NOTE — Assessment & Plan Note (Signed)
Re-start B12 

## 2015-06-20 NOTE — Assessment & Plan Note (Signed)
On KCl bid

## 2015-06-20 NOTE — Assessment & Plan Note (Signed)
Seeing [name] - on diet Labs today at Dr Cindra Eves office

## 2015-06-20 NOTE — Progress Notes (Signed)
Pre visit review using our clinic review tool, if applicable. No additional management support is needed unless otherwise documented below in the visit note. 

## 2015-06-20 NOTE — Assessment & Plan Note (Addendum)
BP Readings from Last 3 Encounters:  06/20/15 140/90  06/01/15 132/88  04/13/15 126/76   Losartan, Bystolic, Norvasc

## 2015-06-20 NOTE — Assessment & Plan Note (Signed)
Doing well 

## 2015-06-20 NOTE — Progress Notes (Signed)
Subjective:  Patient ID: [name], female    DOB: [birth year]  Age: 55 y.o. MRN: [id number]  CC: No chief complaint on file.   HPI [name] presents for HTN, abn Ca, abn K f/u. Seeing Dr [name] (Endo). Pt has lost wt on diet  Outpatient Prescriptions Prior to Visit  Medication Sig Dispense Refill  . amLODipine (NORVASC) 5 MG tablet Take 1 tablet (5 mg total) by mouth daily. (Patient taking differently: Take 2.5 mg by mouth daily. ) 30 tablet 11  . aspirin 81 MG tablet Take 81 mg by mouth daily.    . Blood Glucose Monitoring Suppl (ONE TOUCH ULTRA SYSTEM KIT) W/DEVICE KIT 1 kit by Does not apply route as directed. 1 each 0  . dorzolamide-timolol (COSOPT) 22.3-6.8 MG/ML ophthalmic solution daily.  12  . fluticasone (FLONASE) 50 MCG/ACT nasal spray Place 1-2 sprays into the nose daily. Reported on 05/15/2015    . glucose blood test strip 1 each by Other route as directed. 100 each 3  . Hypertonic Nasal Wash (NASAFLO NETI POT NASAL WASH NA) Place into the nose daily.     [name] KLOR-CON/EF 25 MEQ disintegrating tablet DISSOLVE ONE TABLET IN THE MOUTH TWICE A DAY 60 tablet 11  . mometasone-formoterol (DULERA) 100-5 MCG/ACT AERO Inhale 2 puffs into the lungs daily. 1 Inhaler 5  . Nebivolol HCl (BYSTOLIC) 20 MG TABS 1/2 po qd 60 tablet 11  . ONE TOUCH LANCETS MISC 1 each by Does not apply route as directed. 100 each 3  . valACYclovir (VALTREX) 1000 MG tablet Reported on 05/15/2015    . Omega 3 1000 MG CAPS Take 2 capsules by mouth daily.    [name] losartan (COZAAR) 100 MG tablet Take 1 tablet (100 mg total) by mouth daily. 30 tablet 11  . vitamin B-12 (CYANOCOBALAMIN) 100 MCG tablet Take 100 mcg by mouth daily. Reported on 06/20/2015    . chlorpheniramine-HYDROcodone (TUSSIONEX PENNKINETIC ER) 10-8 MG/5ML SUER Take 5 mLs by mouth every 12 (twelve) hours as needed for cough. (Patient not taking: Reported on 06/20/2015) 100 mL 0  . fluconazole (DIFLUCAN) 100 MG tablet Take 1 tablet (100 mg total) by  mouth daily. (Patient not taking: Reported on 06/20/2015) 3 tablet 0   No facility-administered medications prior to visit.    ROS Review of Systems  Constitutional: Positive for unexpected weight change. Negative for chills, activity change, appetite change and fatigue.  HENT: Negative for congestion, mouth sores and sinus pressure.   Eyes: Negative for visual disturbance.  Respiratory: Negative for cough and chest tightness.   Gastrointestinal: Negative for nausea and abdominal pain.  Genitourinary: Negative for frequency, difficulty urinating and vaginal pain.  Musculoskeletal: Negative for back pain and gait problem.  Skin: Negative for pallor and rash.  Neurological: Negative for dizziness, tremors, weakness, numbness and headaches.  Psychiatric/Behavioral: Negative for suicidal ideas, confusion and sleep disturbance.    Objective:  BP 140/90 mmHg  Pulse 105  Wt 129 lb (58.514 kg)  SpO2 97%  BP Readings from Last 3 Encounters:  06/20/15 140/90  06/01/15 132/88  04/13/15 126/76    Wt Readings from Last 3 Encounters:  06/20/15 129 lb (58.514 kg)  06/01/15 131 lb 6.4 oz (59.603 kg)  05/15/15 132 lb (59.875 kg)    Physical Exam  Constitutional: She appears well-developed. No distress.  HENT:  Head: Normocephalic.  Right Ear: External ear normal.  Left Ear: External ear normal.  Nose: Nose normal.  Mouth/Throat: Oropharynx is clear  and moist.  Eyes: Conjunctivae are normal. Pupils are equal, round, and reactive to light. Right eye exhibits no discharge. Left eye exhibits no discharge.  Neck: Normal range of motion. Neck supple. No JVD present. No tracheal deviation present. No thyromegaly present.  Cardiovascular: Normal rate, regular rhythm and normal heart sounds.   Pulmonary/Chest: No stridor. No respiratory distress. She has no wheezes.  Abdominal: Soft. Bowel sounds are normal. She exhibits no distension and no mass. There is no tenderness. There is no rebound and  no guarding.  Musculoskeletal: She exhibits no edema or tenderness.  Lymphadenopathy:    She has no cervical adenopathy.  Neurological: She displays normal reflexes. No cranial nerve deficit. She exhibits normal muscle tone. Coordination normal.  Skin: No rash noted. No erythema.  Psychiatric: She has a normal mood and affect. Her behavior is normal. Judgment and thought content normal.    Lab Results  Component Value Date   WBC 9.6 12/02/2014   HGB 15.0 12/02/2014   HCT 44.8 12/02/2014   PLT 373.0 12/02/2014   GLUCOSE 89 02/21/2015   CHOL 150 01/25/2013   TRIG 55.0 01/25/2013   HDL 38.90* 01/25/2013   LDLCALC 100* 01/25/2013   ALT 22 02/21/2015   AST 23 02/21/2015   NA 138 02/21/2015   K 3.8 02/21/2015   CL 102 02/21/2015   CREATININE 1.35* 02/21/2015   BUN 23 02/21/2015   CO2 31 02/21/2015   TSH 2.60 02/21/2015   HGBA1C 5.9 05/18/2013    Dg Chest 2 View  04/13/2015  CLINICAL DATA:  Cough, fever. EXAM: CHEST  2 VIEW COMPARISON:  November 25, 2014. FINDINGS: Stable cardiomediastinal silhouette. No pneumothorax or pleural effusion is noted. Stable bilateral perihilar and upper lobe opacities are noted consistent with history of sarcoidosis. Bony thorax is unremarkable. IMPRESSION: Stable bilateral perihilar and upper lobe opacities are noted consistent with history of sarcoidosis. No significant changes noted compared to prior exam. Electronically Signed   By: [name], M.D.   On: 04/13/2015 15:12    Assessment & Plan:   There are no diagnoses linked to this encounter. I have discontinued [name]'s Omega 3, chlorpheniramine-HYDROcodone, and fluconazole. I am also having her maintain her Hypertonic Nasal Wash (NASAFLO NETI POT NASAL [place] NA), valACYclovir, fluticasone, KLOR-CON/EF, losartan, amLODipine, ONE TOUCH ULTRA SYSTEM KIT, glucose blood, ONE TOUCH LANCETS, Nebivolol HCl, dorzolamide-timolol, aspirin, mometasone-formoterol, vitamin B-12, Omega-3, loratadine, and  Probiotic Product (PROBIOTIC DAILY PO).  Meds ordered this encounter  Medications  . Omega-3 300 MG CAPS    Sig: Take 600 mg by mouth daily.  [name] loratadine (CLARITIN) 10 MG tablet    Sig: Take 10 mg by mouth daily.  . Probiotic Product (PROBIOTIC DAILY PO)    Sig: Take 1 each by mouth daily.     Follow-up: No Follow-up on file.  Walker Kehr, MD

## 2015-07-05 ENCOUNTER — Other Ambulatory Visit: Payer: Self-pay | Admitting: Internal Medicine

## 2015-07-11 ENCOUNTER — Ambulatory Visit (AMBULATORY_SURGERY_CENTER): Payer: Self-pay | Admitting: *Deleted

## 2015-07-11 VITALS — Ht 60.25 in | Wt 128.6 lb

## 2015-07-11 DIAGNOSIS — Z1211 Encounter for screening for malignant neoplasm of colon: Secondary | ICD-10-CM

## 2015-07-11 MED ORDER — NA SULFATE-K SULFATE-MG SULF 17.5-3.13-1.6 GM/177ML PO SOLN: 1.0000 | Freq: Once | ORAL | Status: DC

## 2015-07-11 NOTE — Progress Notes (Signed)
No egg or soy allergy known to patient   issues with past sedation with any surgeries  or procedures of post op n/v--, no intubation problems  No diet pills per patient No home 02 use per patient  No blood thinners per patient  Pt denies issues with constipation After her colon 2007, pt states hard time waking her up

## 2015-07-13 ENCOUNTER — Encounter: Payer: Self-pay | Admitting: Gastroenterology

## 2015-07-15 DIAGNOSIS — D126 Benign neoplasm of colon, unspecified: Secondary | ICD-10-CM

## 2015-07-15 HISTORY — DX: Benign neoplasm of colon, unspecified: D12.6

## 2015-07-21 ENCOUNTER — Telehealth: Payer: Self-pay | Admitting: Gastroenterology

## 2015-07-21 NOTE — Telephone Encounter (Signed)
Pt wanted to make sure she was not supposed to be taking Dulcolax with her prep.  Explained that she does not need Dulcolax with Suprep.  Questions answered about diet restrictions 5 days before procedure.

## 2015-07-25 ENCOUNTER — Encounter: Payer: Self-pay | Admitting: Gastroenterology

## 2015-07-25 ENCOUNTER — Ambulatory Visit (AMBULATORY_SURGERY_CENTER): Payer: BC Managed Care – PPO | Admitting: Gastroenterology

## 2015-07-25 VITALS — BP 122/83 | HR 82 | Temp 97.3°F | Resp 18 | Ht 60.25 in | Wt 128.0 lb

## 2015-07-25 DIAGNOSIS — Z1211 Encounter for screening for malignant neoplasm of colon: Secondary | ICD-10-CM | POA: Diagnosis not present

## 2015-07-25 DIAGNOSIS — D123 Benign neoplasm of transverse colon: Secondary | ICD-10-CM

## 2015-07-25 MED ORDER — SODIUM CHLORIDE 0.9 % IV SOLN: 500.0000 mL | INTRAVENOUS | Status: DC

## 2015-07-25 NOTE — Patient Instructions (Signed)
YOU HAD AN ENDOSCOPIC PROCEDURE TODAY AT [facility]:   Refer to the procedure report that was given to you for any specific questions about what was found during the examination.  If the procedure report does not answer your questions, please call your gastroenterologist to clarify.  If you requested that your care partner not be given the details of your procedure findings, then the procedure report has been included in a sealed envelope for you to review at your convenience later.  YOU SHOULD EXPECT: Some feelings of bloating in the abdomen. Passage of more gas than usual.  Walking can help get rid of the air that was put into your GI tract during the procedure and reduce the bloating. If you had a lower endoscopy (such as a colonoscopy or flexible sigmoidoscopy) you may notice spotting of blood in your stool or on the toilet paper. If you underwent a bowel prep for your procedure, you may not have a normal bowel movement for a few days.  Please Note:  You might notice some irritation and congestion in your nose or some drainage.  This is from the oxygen used during your procedure.  There is no need for concern and it should clear up in a day or so.  SYMPTOMS TO REPORT IMMEDIATELY:   Following lower endoscopy (colonoscopy or flexible sigmoidoscopy):  Excessive amounts of blood in the stool  Significant tenderness or worsening of abdominal pains  Swelling of the abdomen that is new, acute  Fever of 100F or higher  For urgent or emergent issues, a gastroenterologist can be reached at any hour by calling [phone].  DIET: Your first meal following the procedure should be a small meal and then it is ok to progress to your normal diet. Heavy or fried foods are harder to digest and may make you feel nauseous or bloated.  Likewise, meals heavy in dairy and vegetables can increase bloating.  Drink plenty of fluids but you should avoid alcoholic beverages for 24 hours.  ACTIVITY:   You should plan to take it easy for the rest of today and you should NOT DRIVE or use heavy machinery until tomorrow (because of the sedation medicines used during the test).    FOLLOW UP: Our staff will call the number listed on your records the next business day following your procedure to check on you and address any questions or concerns that you may have regarding the information given to you following your procedure. If we do not reach you, we will leave a message.  However, if you are feeling well and you are not experiencing any problems, there is no need to return our call.  We will assume that you have returned to your regular daily activities without incident.  If any biopsies were taken you will be contacted by phone or by letter within the next 1-3 weeks.  Please call us at [phone] if you have not heard about the biopsies in 3 weeks.    SIGNATURES/CONFIDENTIALITY: You and/or your care partner have signed paperwork which will be entered into your electronic medical record.  These signatures attest to the fact that that the information above on your After Visit Summary has been reviewed and is understood.  Full responsibility of the confidentiality of this discharge information lies with you and/or your care-partner.  Await pathology  Please read over handouts about polyps, diverticulosis, hemorrhoids, and high fiber diets  Continue your normal medications

## 2015-07-25 NOTE — Progress Notes (Signed)
Called to room to assist during endoscopic procedure.  Patient ID and intended procedure confirmed with present staff. Received instructions for my participation in the procedure from the performing physician.  

## 2015-07-25 NOTE — Progress Notes (Signed)
Stable to RR 

## 2015-07-25 NOTE — Op Note (Signed)
[place] Patient Name: [name] Procedure Date: 07/25/2015 7:35 AM MRN: [id number] Endoscopist: Ladene Artist , MD Age: 55 Referring MD:  Date of Birth: 1960-08-06 Gender: Female Account #: [id number] Procedure:                Colonoscopy Indications:              Screening for colorectal malignant neoplasm Medicines:                Monitored Anesthesia Care Procedure:                Pre-Anesthesia Assessment:                           - Prior to the procedure, a History and Physical                            was performed, and patient medications and                            allergies were reviewed. The patient's tolerance of                            previous anesthesia was also reviewed. The risks                            and benefits of the procedure and the sedation                            options and risks were discussed with the patient.                            All questions were answered, and informed consent                            was obtained. Prior Anticoagulants: The patient has                            taken no previous anticoagulant or antiplatelet                            agents. ASA Grade Assessment: III - A patient with                            severe systemic disease. After reviewing the risks                            and benefits, the patient was deemed in                            satisfactory condition to undergo the procedure.                           After obtaining informed consent, the colonoscope  was passed under direct vision. Throughout the                            procedure, the patient's blood pressure, pulse, and                            oxygen saturations were monitored continuously. The                            Model PCF-[id number]) scope was introduced                            through the anus and advanced to the the cecum,                            identified by  appendiceal orifice and ileocecal                            valve. The colonoscopy was performed without                            difficulty. The patient tolerated the procedure                            well. The quality of the bowel preparation was                            excellent. The ileocecal valve, appendiceal                            orifice, and rectum were photographed. Scope In: 8:13:44 AM Scope Out: 8:27:25 AM Scope Withdrawal Time: 0 hours 9 minutes 6 seconds  Total Procedure Duration: 0 hours 13 minutes 41 seconds  Findings:                 The digital rectal exam was normal.                           A 6 mm polyp was found in the transverse colon. The                            polyp was sessile. The polyp was removed with a                            cold snare. Resection and retrieval were complete.                           Multiple small-mouthed diverticula were found in                            the sigmoid colon and descending colon. There was                            narrowing of the colon in association  with the                            diverticular opening. There was no evidence of                            diverticular bleeding.                           A few small-mouthed diverticula were found in the                            ascending colon. There was no evidence of                            diverticular bleeding.                           Internal hemorrhoids were found during                            retroflexion. The hemorrhoids were moderate and                            Grade I (internal hemorrhoids that do not prolapse).                           The exam was otherwise normal throughout the                            examined colon. Complications:            No immediate complications. Estimated Blood Loss:     Estimated blood loss: none. Impression:               - One 6 mm polyp in the transverse colon, removed                             with a cold snare. Resected and retrieved.                           - Moderate diverticulosis in the sigmoid colon and                            in the descending colon.                           - Mild diverticulosis in the ascending colon. There                            was no evidence of diverticular bleeding.                           - Internal hemorrhoids. Recommendation:           - Patient has a contact number available for  emergencies. The signs and symptoms of potential                            delayed complications were discussed with the                            patient. Return to normal activities tomorrow.                            Written discharge instructions were provided to the                            patient.                           - High fiber diet.                           - Continue present medications.                           - Await pathology results.                           - Repeat colonoscopy in 5 years for surveillance if                            polyp is precancerous, otherwise 10 years Ladene Artist, MD 07/25/2015 8:32:15 AM This report has been signed electronically.

## 2015-07-26 ENCOUNTER — Telehealth: Payer: Self-pay

## 2015-07-26 NOTE — Telephone Encounter (Signed)
  Follow up Call-  Call back number 07/25/2015  Post procedure Call Back phone  # [phone]  Permission to leave phone message Yes     Patient questions:  Do you have a fever, pain , or abdominal swelling? No. Pain Score  0 *  Have you tolerated food without any problems? Yes.    Have you been able to return to your normal activities? Yes.    Do you have any questions about your discharge instructions: Diet   No. Medications  No. Follow up visit  No.  Do you have questions or concerns about your Care? No.  Actions: * If pain score is 4 or above: No action needed, pain <4.

## 2015-07-27 ENCOUNTER — Encounter: Payer: Self-pay | Admitting: Gastroenterology

## 2015-08-09 ENCOUNTER — Other Ambulatory Visit: Payer: Self-pay | Admitting: *Deleted

## 2015-08-09 MED ORDER — POTASSIUM BICARBONATE 25 MEQ PO TBEF: EFFERVESCENT_TABLET | ORAL | 11 refills | Status: DC

## 2015-08-17 ENCOUNTER — Ambulatory Visit: Payer: BC Managed Care – PPO | Admitting: Family Medicine

## 2015-08-17 ENCOUNTER — Telehealth: Payer: Self-pay | Admitting: Family Medicine

## 2015-08-17 ENCOUNTER — Encounter: Payer: Self-pay | Admitting: Family Medicine

## 2015-08-17 ENCOUNTER — Ambulatory Visit (INDEPENDENT_AMBULATORY_CARE_PROVIDER_SITE_OTHER): Payer: BC Managed Care – PPO | Admitting: Family Medicine

## 2015-08-17 VITALS — BP 120/90 | HR 125 | Temp 98.7°F | Ht 60.25 in | Wt 127.0 lb

## 2015-08-17 DIAGNOSIS — J069 Acute upper respiratory infection, unspecified: Secondary | ICD-10-CM | POA: Diagnosis not present

## 2015-08-17 DIAGNOSIS — R000 Tachycardia, unspecified: Secondary | ICD-10-CM

## 2015-08-17 LAB — BASIC METABOLIC PANEL
BUN: 20 mg/dL (ref 6–23)
CHLORIDE: 100 meq/L (ref 96–112)
CO2: 29 meq/L (ref 19–32)
CREATININE: 1.45 mg/dL — AB (ref 0.40–1.20)
Calcium: 10.8 mg/dL — ABNORMAL HIGH (ref 8.4–10.5)
GFR: 48.03 mL/min — ABNORMAL LOW (ref >60.00)
Glucose, Bld: 92 mg/dL (ref 70–99)
Potassium: 4.2 mEq/L (ref 3.5–5.1)
Sodium: 137 mEq/L (ref 135–145)

## 2015-08-17 LAB — TSH: TSH: 1.36 u[IU]/mL (ref 0.35–4.50)

## 2015-08-17 MED ORDER — NEBIVOLOL HCL 20 MG PO TABS: ORAL_TABLET | ORAL | 11 refills | Status: DC

## 2015-08-17 NOTE — Patient Instructions (Addendum)
  [name] I have seen you today for an acute visit because your primary care provider was not available. Monitor for signs of worsening symptoms and seek immediate medical attention if any concerning/warning symptom as we discussed.    A few things to remember from today's visit:   Sinus tachycardia ([place]) - Plan: EKG 12-Lead  URI, acute  Atrial fibrillation. I usually would like for you to follow tomorrow but because she'll not interested in doing so at least this coming Monday with your doctor. Increase Bystolic from 10 mg to 20 mg and increase aspirin from 81 mg to 325 mg.  Stop Dulera or other inhaler. No cold medications. Flonase and Tylenol if needed.  Please be sure medication list is accurate. If a new problem present, please set up appointment sooner than planned today.

## 2015-08-17 NOTE — Progress Notes (Signed)
HPI:  ACUTE VISIT:  Chief Complaint  Patient presents with  . Fever    Pt had fever of 101 last night, has been taking 1/2 tab of tylenol. Stuffy in the head & nose, small cough.    [name] is a 55 y.o. female complaining of fever and nonproductive cough since last night.   + fever 101 F, chill, and fatigue.  + Nasal congestion, rhinorrhea, sore throat, and post nasal drainage. + Mild occipital headache but has had it intermittently for a while, so not sure if it is related.  Denies chest pain, dyspnea, or wheezing.  Denies any abdominal pain, nausea, vomiting, or diarrhea. She is afraid of having pneumonia or a sinus infection. She is supposed to take care of her  70 yo aunt, who is planning on having surgery tomorrow.  No Hx of recent travel. No sick contact. No known insect bite. + Hx of allergic rhinitis, currently she is on cetirizine 10 mg daily and uses Flonase nasal spray as needed. She was in a public pool early June 8184, but just from the trunk down, did not swim. No swimming in rivers or lakes.  OTC medications for this problem: None today, took Tylenol last night.  Symptoms otherwise stable.  -Noted tachycardia and mild arrhythmia on auscultation today. Denies any Hx of atrial fib or arrhythmias. HTN on Bystolic 10 mg, Cozaar, and Amlodipine 5 mg.  She has history of sarcoidosis and asthma, she follows with pulmonology, who she last saw in 06/20/2015. According to pt, she was placed on [facility] and every time she uses today to she feels her heart "going up."  She took a tab of Azithromycin 250 mg she had left from old Rx (03/2014).   Lab Results  Component Value Date   TSH 2.60 02/21/2015    Lab Results  Component Value Date   CREATININE 1.35 (H) 02/21/2015   BUN 23 02/21/2015   NA 138 02/21/2015   K 3.8 02/21/2015   CL 102 02/21/2015   CO2 31 02/21/2015     Review of Systems  Constitutional: Positive for chills, fatigue and  fever. Negative for activity change and unexpected weight change.  HENT: Positive for congestion, postnasal drip, rhinorrhea, sneezing and sore throat. Negative for ear pain, mouth sores, sinus pressure, trouble swallowing and voice change.   Eyes: Negative for discharge, redness and itching.  Respiratory: Positive for cough. Negative for shortness of breath and wheezing.   Cardiovascular: Positive for palpitations. Negative for chest pain and leg swelling.  Gastrointestinal: Negative for abdominal pain, diarrhea, nausea and vomiting.  Musculoskeletal: Negative for back pain, joint swelling, myalgias and neck pain.  Skin: Negative for rash.  Allergic/Immunologic: Negative for environmental allergies.  Neurological: Positive for headaches (occipital, chronic). Negative for syncope, weakness and numbness.  Hematological: Negative for adenopathy. Does not bruise/bleed easily.      Current Outpatient Prescriptions on File Prior to Visit  Medication Sig Dispense Refill  . amLODipine (NORVASC) 5 MG tablet Take 1 tablet (5 mg total) by mouth daily. (Patient taking differently: Take 2.5 mg by mouth daily. ) 30 tablet 11  . aspirin 81 MG tablet Take 81 mg by mouth daily.    . Blood Glucose Monitoring Suppl (ONE TOUCH ULTRA SYSTEM KIT) W/DEVICE KIT 1 kit by Does not apply route as directed. 1 each 0  . dorzolamide-timolol (COSOPT) 22.3-6.8 MG/ML ophthalmic solution daily.  12  . fluticasone (FLONASE) 50 MCG/ACT nasal spray Place 1-2  sprays into the nose daily. Reported on 05/15/2015    . glucose blood test strip 1 each by Other route as directed. 100 each 3  . Hypertonic Nasal Wash (NASAFLO NETI POT NASAL WASH NA) Place into the nose daily.     [name] loratadine (CLARITIN) 10 MG tablet Take 10 mg by mouth daily.    [name] losartan (COZAAR) 100 MG tablet TAKE 1 TABLET BY MOUTH ONCE DAILY 90 tablet 3  . mometasone-formoterol (DULERA) 100-5 MCG/ACT AERO Inhale 2 puffs into the lungs daily. 1 Inhaler 5  . Omega-3  300 MG CAPS Take 600 mg by mouth daily.    . ONE TOUCH LANCETS MISC 1 each by Does not apply route as directed. 100 each 3  . potassium bicarbonate (KLOR-CON/EF) 25 MEQ disintegrating tablet DISSOLVE ONE TABLET IN THE MOUTH TWICE A DAY 60 tablet 11  . Probiotic Product (PROBIOTIC DAILY PO) Take 1 each by mouth daily.    . valACYclovir (VALTREX) 1000 MG tablet Reported on 05/15/2015    . vitamin B-12 (CYANOCOBALAMIN) 100 MCG tablet Take 100 mcg by mouth daily. Reported on 06/20/2015     No current facility-administered medications on file prior to visit.      Past Medical History:  Diagnosis Date  . Allergy   . Anxiety   . Asthma   . Cataract   . Depression    pt states no depression 07-11-15  . Depression   . Glaucoma   . Glaucoma   . Heart murmur    as child only  . Herpes simplex   . Hypertension   . Osteoarthritis   . Osteoporosis   . Pulmonary sarcoidosis ([place])   . Vitamin D deficiency    Allergies  Allergen Reactions  . Amoxicillin-Pot Clavulanate     REACTION: GI symptoms  . Cardizem [Diltiazem Hcl]     HAs Pt can take Norvasc  . Carvedilol     REACTION: tired  . Cefdinir Diarrhea  . Cephalexin Diarrhea    REACTION: unspecified  . Hydrochlorothiazide Other (See Comments)    Dropped BP and caused hair loss   . Influenza Vaccines     Allergic to flu vaccinations per pt.  Soreness and "cold symptoms" per pt  . Prednisone Hypertension  . Sulfamethoxazole     REACTION: unspecified  . Sulfonamide Derivatives   . Verapamil     fatigue     Social History   Social History  . Marital status: Divorced    Spouse name: N/A  . Number of children: N/A  . Years of education: N/A   Social History Main Topics  . Smoking status: Never Smoker  . Smokeless tobacco: Never Used  . Alcohol use No  . Drug use: No  . Sexual activity: No   Other Topics Concern  . None   Social History Narrative   Single/divorced; no children; she has a friend now   Never smoked    Positive history of passive tobacco smoke exposure.   Exercise- up to 4 times a week   Caffeine- 1-2 per week    Vitals:   08/17/15 1329  BP: 120/90  Pulse: (!) 125  Temp: 98.7 F (37.1 C)    Body mass index is 24.6 kg/m.   O2 RA 98%.   Physical Exam  Constitutional: She is oriented to person, place, and time. She appears well-developed. No distress.  HENT:  Head: Atraumatic.  Right Ear: Hearing, tympanic membrane, external ear and ear canal normal.  Left Ear: Hearing, tympanic membrane, external ear and ear canal normal.  Nose: No rhinorrhea. Right sinus exhibits no maxillary sinus tenderness and no frontal sinus tenderness. Left sinus exhibits no maxillary sinus tenderness and no frontal sinus tenderness.  Mouth/Throat: Mucous membranes are normal. Posterior oropharyngeal erythema (Mild) present. No oropharyngeal exudate or posterior oropharyngeal edema.  Postnasal drainage. Hypertrophic turbinates.  Eyes: Conjunctivae are normal.  Neck: No muscular tenderness present. No edema and no erythema present.  Cardiovascular: Normal rate.  An irregular rhythm present.  No murmur heard. Respiratory: Effort normal and breath sounds normal. No respiratory distress.  Lymphadenopathy:       Head (right side): No submandibular adenopathy present.       Head (left side): No submandibular adenopathy present.    She has no cervical adenopathy.  Neurological: She is alert and oriented to person, place, and time.  Skin: Skin is warm. No rash noted. No erythema.  Psychiatric: Her speech is normal. Her mood appears anxious.      ASSESSMENT AND PLAN:   Lab Results  Component Value Date   TSH 1.36 08/17/2015    Lab Results  Component Value Date   CREATININE 1.45 (H) 08/17/2015   BUN 20 08/17/2015   NA 137 08/17/2015   K 4.2 08/17/2015   CL 100 08/17/2015   CO2 29 08/17/2015    Myldred was seen today for fever.  Diagnoses and all orders for this visit:  URI,  acute  Symptoms suggests a viral etiology, I explained patient that symptomatic treatment is usually recommended in this case, so I do not think abx is needed at this time. Since she is concerned for pneumonia I offered CXR but she prefers to hold on it. Nasal saline, Flonase nasal spray. Avoid cold meds. Instructed to monitor for signs of complications, clearly instructed about warning signs. I also explained that cough and nasal congestion can last a few days and sometimes weeks. F/U as needed.   Tachyarrhythmia  EKG today abnormal, atrial fib. EKG's done in the past with sinus arrhythmia.  Stop Dulera. CHA2-DS2-VASC2 score 2 (HTN, female). She refused cardiology referral, refused anticoagulation. She has not tolerated metoprolol all carvedilol in the past. She refused to try the Diltiazem. She understands possible complications of atrial fibrillation. Recommended increasing Bystolic from 10 mg to 20 mg and increase aspirin from 81-325 mg. She does not want to follow tomorrow. She was clearly instructed about warning signs,she voices understanding..  -     EKG 12-Lead -     TSH -     Basic Metabolic Panel -     Nebivolol HCl (BYSTOLIC) 20 MG TABS; 1 po qd          Ramya Vanbergen G. Martinique, MD  [facility]. [place] office.

## 2015-08-17 NOTE — Telephone Encounter (Signed)
Patient called and wanted clarification on if she could take any other medications. Spoke with Dr. Martinique and Dr. Martinique advised that she can take tylenol, saline drops, and Flonase. Patient verbalized understanding.

## 2015-08-21 ENCOUNTER — Telehealth: Payer: Self-pay | Admitting: Internal Medicine

## 2015-08-21 NOTE — Telephone Encounter (Signed)
Informed patient of results and patient verbalized understanding. Patient declined Chest x-ray and will follow up with pcp.

## 2015-08-21 NOTE — Telephone Encounter (Signed)
Pt returning your call

## 2015-09-01 ENCOUNTER — Other Ambulatory Visit: Payer: Self-pay | Admitting: Pulmonary Disease

## 2015-09-17 ENCOUNTER — Other Ambulatory Visit: Payer: Self-pay | Admitting: Internal Medicine

## 2015-10-24 ENCOUNTER — Ambulatory Visit (INDEPENDENT_AMBULATORY_CARE_PROVIDER_SITE_OTHER): Payer: BC Managed Care – PPO | Admitting: Internal Medicine

## 2015-10-24 ENCOUNTER — Encounter: Payer: Self-pay | Admitting: Internal Medicine

## 2015-10-24 ENCOUNTER — Other Ambulatory Visit (INDEPENDENT_AMBULATORY_CARE_PROVIDER_SITE_OTHER): Payer: BC Managed Care – PPO

## 2015-10-24 DIAGNOSIS — D869 Sarcoidosis, unspecified: Secondary | ICD-10-CM

## 2015-10-24 DIAGNOSIS — I10 Essential (primary) hypertension: Secondary | ICD-10-CM | POA: Diagnosis not present

## 2015-10-24 DIAGNOSIS — E876 Hypokalemia: Secondary | ICD-10-CM

## 2015-10-24 DIAGNOSIS — E538 Deficiency of other specified B group vitamins: Secondary | ICD-10-CM

## 2015-10-24 DIAGNOSIS — M1991 Primary osteoarthritis, unspecified site: Secondary | ICD-10-CM | POA: Diagnosis not present

## 2015-10-24 LAB — HEPATIC FUNCTION PANEL
ALT: 20 U/L (ref 0–35)
AST: 20 U/L (ref 0–37)
Albumin: 3.5 g/dL (ref 3.5–5.2)
Alkaline Phosphatase: 100 U/L (ref 39–117)
BILIRUBIN DIRECT: 0 mg/dL (ref 0.0–0.3)
TOTAL PROTEIN: 7.8 g/dL (ref 6.0–8.3)
Total Bilirubin: 0.9 mg/dL (ref 0.2–1.2)

## 2015-10-24 LAB — BASIC METABOLIC PANEL
BUN: 19 mg/dL (ref 6–23)
CALCIUM: 10.7 mg/dL — AB (ref 8.4–10.5)
CO2: 29 mEq/L (ref 19–32)
Chloride: 100 mEq/L (ref 96–112)
Creatinine, Ser: 1.47 mg/dL — ABNORMAL HIGH (ref 0.40–1.20)
GFR: 47.24 mL/min — AB (ref >60.00)
Glucose, Bld: 88 mg/dL (ref 70–99)
Potassium: 4 mEq/L (ref 3.5–5.1)
SODIUM: 138 meq/L (ref 135–145)

## 2015-10-24 LAB — TSH: TSH: 3.42 u[IU]/mL (ref 0.35–4.50)

## 2015-10-24 NOTE — Assessment & Plan Note (Signed)
Stable

## 2015-10-24 NOTE — Assessment & Plan Note (Signed)
Discussed arthralgias

## 2015-10-24 NOTE — Assessment & Plan Note (Signed)
Losartan, Bystolic, Norvasc 

## 2015-10-24 NOTE — Progress Notes (Signed)
Pre visit review using our clinic review tool, if applicable. No additional management support is needed unless otherwise documented below in the visit note. 

## 2015-10-24 NOTE — Assessment & Plan Note (Signed)
On B12 

## 2015-10-24 NOTE — Progress Notes (Signed)
Subjective:  Patient ID: [name], female    DOB: 08-17-1960  Age: 55 y.o. MRN: [id number]  CC: No chief complaint on file.   HPI [name] presents for HTN, hypokalemia, elevated glucose f/u  Outpatient Medications Prior to Visit  Medication Sig Dispense Refill  . amLODipine (NORVASC) 5 MG tablet TAKE 1 TABLET BY MOUTH DAILY 30 tablet 11  . aspirin 81 MG tablet Take 81 mg by mouth 3 (three) times daily.     . Blood Glucose Monitoring Suppl (ONE TOUCH ULTRA SYSTEM KIT) W/DEVICE KIT 1 kit by Does not apply route as directed. 1 each 0  . dorzolamide-timolol (COSOPT) 22.3-6.8 MG/ML ophthalmic solution daily.  12  . glucose blood test strip 1 each by Other route as directed. 100 each 3  . Hypertonic Nasal Wash (NASAFLO NETI POT NASAL WASH NA) Place into the nose daily.     [name] loratadine (CLARITIN) 10 MG tablet Take 10 mg by mouth daily.    [name] losartan (COZAAR) 100 MG tablet TAKE 1 TABLET BY MOUTH ONCE DAILY 90 tablet 3  . Nebivolol HCl (BYSTOLIC) 20 MG TABS 1 po qd 60 tablet 11  . Omega-3 300 MG CAPS Take 600 mg by mouth daily.    . ONE TOUCH LANCETS MISC 1 each by Does not apply route as directed. 100 each 3  . potassium bicarbonate (KLOR-CON/EF) 25 MEQ disintegrating tablet DISSOLVE ONE TABLET IN THE MOUTH TWICE A DAY 60 tablet 11  . valACYclovir (VALTREX) 1000 MG tablet Reported on 05/15/2015    . vitamin B-12 (CYANOCOBALAMIN) 100 MCG tablet Take 100 mcg by mouth daily. Reported on 06/20/2015    . fluticasone (FLONASE) 50 MCG/ACT nasal spray USE 2 SPRAYS IN EACH NOSTRIL ONCE DAILY (Patient not taking: Reported on 10/24/2015) 16 g 6  . mometasone-formoterol (DULERA) 100-5 MCG/ACT AERO Inhale 2 puffs into the lungs daily. (Patient not taking: Reported on 10/24/2015) 1 Inhaler 5  . Probiotic Product (PROBIOTIC DAILY PO) Take 1 each by mouth daily.     No facility-administered medications prior to visit.     ROS Review of Systems  Constitutional: Positive for fatigue and  unexpected weight change. Negative for activity change, appetite change and chills.  HENT: Negative for congestion, mouth sores and sinus pressure.   Eyes: Negative for visual disturbance.  Respiratory: Positive for wheezing. Negative for cough and chest tightness.   Gastrointestinal: Negative for abdominal pain and nausea.  Genitourinary: Negative for difficulty urinating, frequency and vaginal pain.  Musculoskeletal: Negative for back pain and gait problem.  Skin: Negative for pallor and rash.  Neurological: Negative for dizziness, tremors, weakness, numbness and headaches.  Psychiatric/Behavioral: Negative for confusion and sleep disturbance.    Objective:  BP 118/88   Pulse (!) 102   Wt 124 lb (56.2 kg)   SpO2 98%   BMI 24.02 kg/m   BP Readings from Last 3 Encounters:  10/24/15 118/88  08/17/15 120/90  07/25/15 122/83    Wt Readings from Last 3 Encounters:  10/24/15 124 lb (56.2 kg)  08/17/15 127 lb (57.6 kg)  07/25/15 128 lb (58.1 kg)    Physical Exam  Constitutional: She appears well-developed. No distress.  HENT:  Head: Normocephalic.  Right Ear: External ear normal.  Left Ear: External ear normal.  Nose: Nose normal.  Mouth/Throat: Oropharynx is clear and moist.  Eyes: Conjunctivae are normal. Pupils are equal, round, and reactive to light. Right eye exhibits no discharge. Left eye exhibits no discharge.  Neck: Normal range of motion. Neck supple. No JVD present. No tracheal deviation present. No thyromegaly present.  Cardiovascular: Normal rate, regular rhythm and normal heart sounds.   Pulmonary/Chest: No stridor. No respiratory distress. She has no wheezes.  Abdominal: Soft. Bowel sounds are normal. She exhibits no distension and no mass. There is no tenderness. There is no rebound and no guarding.  Musculoskeletal: She exhibits no edema or tenderness.  Lymphadenopathy:    She has no cervical adenopathy.  Neurological: She displays normal reflexes. No  cranial nerve deficit. She exhibits normal muscle tone. Coordination normal.  Skin: No rash noted. No erythema.  Psychiatric: She has a normal mood and affect. Her behavior is normal. Judgment and thought content normal.    Lab Results  Component Value Date   WBC 9.6 12/02/2014   HGB 15.0 12/02/2014   HCT 44.8 12/02/2014   PLT 373.0 12/02/2014   GLUCOSE 92 08/17/2015   CHOL 150 01/25/2013   TRIG 55.0 01/25/2013   HDL 38.90 (L) 01/25/2013   LDLCALC 100 (H) 01/25/2013   ALT 22 02/21/2015   AST 23 02/21/2015   NA 137 08/17/2015   K 4.2 08/17/2015   CL 100 08/17/2015   CREATININE 1.45 (H) 08/17/2015   BUN 20 08/17/2015   CO2 29 08/17/2015   TSH 1.36 08/17/2015   HGBA1C 5.9 05/18/2013    Dg Chest 2 View  Result Date: 04/13/2015 CLINICAL DATA:  Cough, fever. EXAM: CHEST  2 VIEW COMPARISON:  November 25, 2014. FINDINGS: Stable cardiomediastinal silhouette. No pneumothorax or pleural effusion is noted. Stable bilateral perihilar and upper lobe opacities are noted consistent with history of sarcoidosis. Bony thorax is unremarkable. IMPRESSION: Stable bilateral perihilar and upper lobe opacities are noted consistent with history of sarcoidosis. No significant changes noted compared to prior exam. Electronically Signed   By: Marijo Conception, M.D.   On: 04/13/2015 15:12    Assessment & Plan:   There are no diagnoses linked to this encounter. I am having Ms. Reiswig maintain her Hypertonic Nasal Wash (NASAFLO NETI POT NASAL [place] NA), valACYclovir, ONE TOUCH ULTRA SYSTEM KIT, glucose blood, ONE TOUCH LANCETS, dorzolamide-timolol, aspirin, mometasone-formoterol, vitamin B-12, Omega-3, loratadine, Probiotic Product (PROBIOTIC DAILY PO), losartan, potassium bicarbonate, Nebivolol HCl, fluticasone, and amLODipine.  No orders of the defined types were placed in this encounter.    Follow-up: No Follow-up on file.  Walker Kehr, MD

## 2015-10-24 NOTE — Assessment & Plan Note (Signed)
On KCl 

## 2015-10-25 ENCOUNTER — Telehealth: Payer: Self-pay | Admitting: Internal Medicine

## 2015-10-25 MED ORDER — AZITHROMYCIN 250 MG PO TABS: ORAL_TABLET | ORAL | 0 refills | Status: DC

## 2015-10-25 NOTE — Telephone Encounter (Signed)
Entered by Cassandria Anger, MD at 10/24/2015 10:02 PM  Dear [name],  Your labs/tests are stable. Calcium level is a little better.  Sincerely,  Walker Kehr, MD

## 2015-10-25 NOTE — Telephone Encounter (Signed)
Pt informed of below. She is requesting a refill on Zpak. She c/o chest congestion, cough at times. She is taking Mucinex, but is requesting Zpak 250 mg. Please advise.

## 2015-10-25 NOTE — Telephone Encounter (Signed)
Patient is requesting results of labs.  Patient is requesting call back before 3:30pm if possible and if results are back.

## 2015-10-25 NOTE — Telephone Encounter (Signed)
PCP advised ok to Rf zpak. Done. See meds.

## 2015-11-08 ENCOUNTER — Other Ambulatory Visit: Payer: Self-pay | Admitting: Internal Medicine

## 2015-11-08 DIAGNOSIS — R000 Tachycardia, unspecified: Secondary | ICD-10-CM

## 2016-01-23 ENCOUNTER — Encounter: Payer: Self-pay | Admitting: Family Medicine

## 2016-01-23 ENCOUNTER — Ambulatory Visit (INDEPENDENT_AMBULATORY_CARE_PROVIDER_SITE_OTHER): Payer: BLUE CROSS/BLUE SHIELD | Admitting: Family Medicine

## 2016-01-23 VITALS — BP 140/100 | HR 70 | Resp 12 | Ht 60.25 in | Wt 123.2 lb

## 2016-01-23 DIAGNOSIS — I4891 Unspecified atrial fibrillation: Secondary | ICD-10-CM | POA: Diagnosis not present

## 2016-01-23 DIAGNOSIS — R0609 Other forms of dyspnea: Secondary | ICD-10-CM | POA: Diagnosis not present

## 2016-01-23 DIAGNOSIS — R0600 Dyspnea, unspecified: Secondary | ICD-10-CM

## 2016-01-23 DIAGNOSIS — I10 Essential (primary) hypertension: Secondary | ICD-10-CM

## 2016-01-23 NOTE — Patient Instructions (Addendum)
  [name] I have seen you today for an acute visit.  1. Essential hypertension  - EKG 12-Lead  2. Atrial fibrillation, unspecified type ([place])  - EKG 12-Lead - Ambulatory referral to Cardiology    In general please monitor for signs of worsening symptoms and seek immediate medical attention if any concerning/warning symptom as we discussed.  We have discussed possible complications of atrial fibrillation, so monitor for any warning symptoms.   Since you do not want to start blood thinners today, which we have discussed before and strongly recommended, continue Aspirin and keep appointment with cardiologists tomorrow.  You need to increase dose of Bystolic from 1/4 tab to whole tab (20 mg).

## 2016-01-23 NOTE — Progress Notes (Signed)
Pre visit review using our clinic review tool, if applicable. No additional management support is needed unless otherwise documented below in the visit note. 

## 2016-01-23 NOTE — Progress Notes (Signed)
HPI:  ACUTE VISIT:  Chief Complaint  Patient presents with  . Hypertension    [name] is a 56 y.o. female, who is here today complaining of elevated BP. According to patient, her BP was elevated today during her gynecologist visit and she was recommended to be seen by her PCP, Dr Alain Marion has no openings today.   At her gyn around around 11 am and 12 noon BP was 140/100.  She is not checking BP at home.  She has history of hypertension, she has been adjusting her medications herself because afraid of side effects.  She is currently on amlodipine 5 mg tablet, which she has decreased to 1/4 tablet daily. She thinks this medication is day 3 some she has had palpitations/racing heart for the past few months.  She states that she has had palpitations for about a year but they seem to be getting worse, keeping her from sleep. According to patient, she looked-up on line and found aout that Amlodipine could cause tachycardia, so she started decreasing it. Episodes of palpitation have improved and sleeping better.  She has also decreased Bystolic from 20 mg to 1/4 tab because she thinks it was causing difficulty breathing "asthma symptoms."  She tells me that she also looked it up on line and found that this medication could cause asthma. + Exertional dyspnea since 2015 with moderate exertion, mildly worse for the past few months. She is also on Cozaar 100 mg aily.  She denies headache, visual changes, chest pain, edema, or PND.  + Orthopnea, sleeping on 2 pillows for "at least a month", she can breath better, feels "moe comfortable." + Nocturia.  Dx with sarcoidosis. She follows with pulmonologists, has not followed recently because she lost her health insurance and just got it back 01/16/16. She is planning on scheduling appt.     Lab Results  Component Value Date   CREATININE 1.47 (H) 10/24/2015   BUN 19 10/24/2015   NA 138 10/24/2015   K 4.0 10/24/2015   CL  100 10/24/2015   CO2 29 10/24/2015    Lab Results  Component Value Date   TSH 3.42 10/24/2015    I saw her for an acute visit in 08/2015 for URI and found irregular HR, she did not follow with PCP as recommended. At the time of her last OV, she refused cardiologists referral and refused anticoagulation. She takes Aspirin 81 mg tid.   Review of Systems  Constitutional: Positive for fatigue. Negative for appetite change, fever and unexpected weight change.  HENT: Negative for mouth sores, nosebleeds and trouble swallowing.   Eyes: Negative for pain and visual disturbance.  Respiratory: Positive for shortness of breath. Negative for cough and wheezing.   Cardiovascular: Positive for palpitations. Negative for chest pain and leg swelling.  Gastrointestinal: Negative for abdominal pain, nausea and vomiting.       Negative for changes in bowel habits.  Genitourinary: Negative for decreased urine volume, difficulty urinating and hematuria.  Skin: Negative for pallor and rash.  Neurological: Negative for tremors, syncope, weakness and headaches.  Psychiatric/Behavioral: Negative for confusion. The patient is nervous/anxious.       Current Outpatient Prescriptions on File Prior to Visit  Medication Sig Dispense Refill  . amLODipine (NORVASC) 5 MG tablet TAKE 1 TABLET BY MOUTH DAILY (Patient taking differently: take 1/4 of a tablet by mouth daily.) 30 tablet 11  . aspirin 81 MG tablet Take 81 mg by mouth 3 (three) times  daily.     . Blood Glucose Monitoring Suppl (ONE TOUCH ULTRA SYSTEM KIT) W/DEVICE KIT 1 kit by Does not apply route as directed. 1 each 0  . BYSTOLIC 20 MG TABS TAKE 2 TABLETS BY MOUTH AS DIRECTED 60 tablet 4  . dorzolamide-timolol (COSOPT) 22.3-6.8 MG/ML ophthalmic solution daily.  12  . fluticasone (FLONASE) 50 MCG/ACT nasal spray USE 2 SPRAYS IN EACH NOSTRIL ONCE DAILY 16 g 6  . glucose blood test strip 1 each by Other route as directed. 100 each 3  . loratadine  (CLARITIN) 10 MG tablet Take 10 mg by mouth daily.    . losartan (COZAAR) 100 MG tablet TAKE 1 TABLET BY MOUTH ONCE DAILY 90 tablet 3  . Omega-3 300 MG CAPS Take 600 mg by mouth daily.    . ONE TOUCH LANCETS MISC 1 each by Does not apply route as directed. 100 each 3  . potassium bicarbonate (KLOR-CON/EF) 25 MEQ disintegrating tablet DISSOLVE ONE TABLET IN THE MOUTH TWICE A DAY 60 tablet 11  . Probiotic Product (PROBIOTIC DAILY PO) Take 1 each by mouth daily.    . vitamin B-12 (CYANOCOBALAMIN) 100 MCG tablet Take 100 mcg by mouth daily. Reported on 06/20/2015     No current facility-administered medications on file prior to visit.      Past Medical History:  Diagnosis Date  . Allergy   . Anxiety   . Asthma   . Cataract   . Depression    pt states no depression 07-11-15  . Depression   . Glaucoma   . Glaucoma   . Heart murmur    as child only  . Herpes simplex   . Hypertension   . Osteoarthritis   . Osteoporosis   . Pulmonary sarcoidosis (HCC)   . Vitamin D deficiency    Allergies  Allergen Reactions  . Amoxicillin-Pot Clavulanate     REACTION: GI symptoms  . Cardizem [Diltiazem Hcl]     HAs Pt can take Norvasc  . Carvedilol     REACTION: tired  . Cefdinir Diarrhea  . Cephalexin Diarrhea    REACTION: unspecified  . Hydrochlorothiazide Other (See Comments)    Dropped BP and caused hair loss   . Influenza Vaccines     Allergic to flu vaccinations per pt.  Soreness and "cold symptoms" per pt  . Prednisone Hypertension  . Sulfamethoxazole     REACTION: unspecified  . Sulfonamide Derivatives   . Verapamil     fatigue    Social History   Social History  . Marital status: Divorced    Spouse name: N/A  . Number of children: N/A  . Years of education: N/A   Social History Main Topics  . Smoking status: Never Smoker  . Smokeless tobacco: Never Used  . Alcohol use No  . Drug use: No  . Sexual activity: No   Other Topics Concern  . None   Social History  Narrative   Single/divorced; no children; she has a friend now   Never smoked   Positive history of passive tobacco smoke exposure.   Exercise- up to 4 times a week   Caffeine- 1-2 per week    Vitals:   01/23/16 1557  BP: (!) 140/100  Pulse: 70  Resp: 12    O2 sat 94% at RA.  Body mass index is 23.87 kg/m.    Physical Exam  Constitutional: She is oriented to person, place, and time. She appears well-developed and well-nourished. No distress.    HENT:  Head: Atraumatic.  Mouth/Throat: Oropharynx is clear and moist and mucous membranes are normal.  Eyes: Conjunctivae and EOM are normal. Pupils are equal, round, and reactive to light.  Neck: JVD present.  Cardiovascular: Normal rate.  An irregular rhythm present.  No murmur heard. ? S3.  Respiratory: Effort normal and breath sounds normal. No respiratory distress.  GI: Soft. She exhibits no mass. There is no hepatomegaly. There is no tenderness.  Musculoskeletal: She exhibits edema (trace pitting edema LE bilateral.). She exhibits no tenderness.  Lymphadenopathy:    She has no cervical adenopathy.  Neurological: She is alert and oriented to person, place, and time. She has normal strength. Coordination and gait normal.  Skin: Skin is warm. No erythema.  Psychiatric: Her mood appears anxious. Cognition and memory are normal.  Well groomed, good eye contact.      ASSESSMENT AND PLAN:     [name] was seen today for hypertension.  Diagnoses and all orders for this visit:  Exertional dyspnea  We discussed possible etiologies. On problem list she has asthma and she is reporting Hx of sarcoidosis, this could contribute to this problem but cardiac etiology is also to be considered. EKG today new  inverted T wave and biphasic T wave anteroseptal. Atrial fib still present vs flutter. Case and EKG were discussed with Dr Seward Meth, he compared with prior EKG and thinks T wave abnormalities are related to atrial fib/futler rather  that ischemia.   Clearly instructed about warning signs.  Atrial fibrillation, unspecified type [facility])  We discussed possible complications and recommended management. She refused starting anticoagulation or increasing dose of beta blocker, she prefers to wait until she sees cardiologist. She does not want Echo referral placed at this point. She already called Dr. Jacalyn Lefevre office and already has an appointment to see him tomorrow at 48 AM.  Referral was placed. Clearly instructed about warning signs, she voices understanding.  -     EKG 12-Lead -     Ambulatory referral to Cardiology  Essential hypertension  BP re-checked 142/105.  Not well controlled. Possible complications of elevated BP discussed. She doesn't want to increase dose of amlodipine or Bystolic. She agrees with continuing Cozaar. Monitor BP at home.  She tells me that she has an appt on 02/29/16 with PCP  -     EKG 12-Lead     Return in about 1 day (around 01/24/2016) for cardiologists..     Face to face OV 40 min. > 50% was dedicated to discussion of HTN and possible complications, side effects of medications, EKG findings. Atrial fib management and complications, anticoagulation options and side effects. We also discussed my conversation with Dr Seward Meth (cardiologists on call).   -[name] was advised to return or notify a doctor immediately if symptoms worsen or persist or new concerns arise.       Betty G. Martinique, MD  [facility]. [place] office.

## 2016-01-24 ENCOUNTER — Ambulatory Visit (INDEPENDENT_AMBULATORY_CARE_PROVIDER_SITE_OTHER): Payer: BLUE CROSS/BLUE SHIELD | Admitting: Cardiology

## 2016-01-24 ENCOUNTER — Encounter: Payer: Self-pay | Admitting: Cardiology

## 2016-01-24 ENCOUNTER — Ambulatory Visit (HOSPITAL_BASED_OUTPATIENT_CLINIC_OR_DEPARTMENT_OTHER)
Admission: RE | Admit: 2016-01-24 | Discharge: 2016-01-24 | Disposition: A | Discharge status: Home / Self Care | Payer: BLUE CROSS/BLUE SHIELD | Source: Ambulatory Visit | Attending: Cardiology | Admitting: Cardiology

## 2016-01-24 ENCOUNTER — Other Ambulatory Visit: Payer: Self-pay | Admitting: Obstetrics and Gynecology

## 2016-01-24 VITALS — BP 143/98 | HR 80 | Ht 60.25 in | Wt 121.4 lb

## 2016-01-24 DIAGNOSIS — I340 Nonrheumatic mitral (valve) insufficiency: Secondary | ICD-10-CM | POA: Insufficient documentation

## 2016-01-24 DIAGNOSIS — I4892 Unspecified atrial flutter: Secondary | ICD-10-CM | POA: Insufficient documentation

## 2016-01-24 DIAGNOSIS — N644 Mastodynia: Secondary | ICD-10-CM

## 2016-01-24 DIAGNOSIS — I10 Essential (primary) hypertension: Secondary | ICD-10-CM

## 2016-01-24 LAB — ECHOCARDIOGRAM COMPLETE
Height: 60.25 in
WEIGHTICAEL: 1942.4 [oz_av]

## 2016-01-24 MED ORDER — APIXABAN 5 MG PO TABS
5.0000 mg | ORAL_TABLET | Freq: Two times a day (BID) | ORAL | 6 refills | Status: DC
Start: 2016-01-24 — End: 2016-08-04

## 2016-01-24 NOTE — Progress Notes (Signed)
  Echocardiogram 2D Echocardiogram has been performed.  [name] L Androw 01/24/2016, 3:55 PM

## 2016-01-24 NOTE — Progress Notes (Signed)
Referring-Dr Betty Martinique  HPI: 56 year old female for evaluation of atrial flutter. Patient describes intermittent dyspnea on exertion since 2016. There is no orthopnea, PND, pedal edema, chest pain or syncope. Occasional palpitations. No bleeding. Reviewing her electrocardiograms she has been in either atrial flutter or atrial tachycardia since 2016. Because of the above cardiology was asked to evaluate.  Current Outpatient Prescriptions  Medication Sig Dispense Refill  . acyclovir (ZOVIRAX) 200 MG capsule Take 200 mg by mouth 2 (two) times daily.    [name] amLODipine (NORVASC) 5 MG tablet TAKE 1 TABLET BY MOUTH DAILY (Patient taking differently: take 1/4 of a tablet by mouth daily.) 30 tablet 11  . Blood Glucose Monitoring Suppl (ONE TOUCH ULTRA SYSTEM KIT) W/DEVICE KIT 1 kit by Does not apply route as directed. 1 each 0  . BYSTOLIC 20 MG TABS TAKE 2 TABLETS BY MOUTH AS DIRECTED 60 tablet 4  . dorzolamide-timolol (COSOPT) 22.3-6.8 MG/ML ophthalmic solution Place 1 drop into the right eye daily.   12  . fluticasone (FLONASE) 50 MCG/ACT nasal spray USE 2 SPRAYS IN EACH NOSTRIL ONCE DAILY 16 g 6  . glucose blood test strip 1 each by Other route as directed. 100 each 3  . loratadine (CLARITIN) 10 MG tablet Take 10 mg by mouth daily.    [name] losartan (COZAAR) 100 MG tablet TAKE 1 TABLET BY MOUTH ONCE DAILY 90 tablet 3  . Omega-3 300 MG CAPS Take 600 mg by mouth daily.    . ONE TOUCH LANCETS MISC 1 each by Does not apply route as directed. 100 each 3  . potassium bicarbonate (KLOR-CON/EF) 25 MEQ disintegrating tablet DISSOLVE ONE TABLET IN THE MOUTH TWICE A DAY 60 tablet 11  . Probiotic Product (PROBIOTIC DAILY PO) Take 1 each by mouth daily.    . vitamin B-12 (CYANOCOBALAMIN) 100 MCG tablet Take 100 mcg by mouth daily. Reported on 06/20/2015     No current facility-administered medications for this visit.     Allergies  Allergen Reactions  . Amoxicillin-Pot Clavulanate     REACTION: GI symptoms    . Cardizem [Diltiazem Hcl]     HAs Pt can take Norvasc  . Carvedilol     REACTION: tired  . Cefdinir Diarrhea  . Cephalexin Diarrhea    REACTION: unspecified  . Hydrochlorothiazide Other (See Comments)    Dropped BP and caused hair loss   . Influenza Vaccines     Allergic to flu vaccinations per pt.  Soreness and "cold symptoms" per pt  . Prednisone Hypertension  . Sulfamethoxazole     REACTION: unspecified  . Sulfonamide Derivatives   . Verapamil     fatigue     Past Medical History:  Diagnosis Date  . Allergy   . Anxiety   . Asthma   . Atrial flutter ([place])   . Cataract   . Depression    pt states no depression 07-11-15  . Glaucoma   . Heart murmur    as child only  . Herpes simplex   . Hypertension   . Multiple thyroid nodules   . Osteoarthritis   . Osteoporosis   . PUD (peptic ulcer disease)   . Pulmonary sarcoidosis ([place])   . Vitamin D deficiency     Past Surgical History:  Procedure Laterality Date  . BUNIONECTOMY     x2  . CATARACT EXTRACTION Right   . DENTAL SURGERY    . DILATION AND CURETTAGE OF UTERUS    . fibroid tumor removal    .  GLAUCOMA SURGERY     Right eye  . MYOMECTOMY      Social History   Social History  . Marital status: Divorced    Spouse name: N/A  . Number of children: N/A  . Years of education: N/A   Occupational History  . Not on file.   Social History Main Topics  . Smoking status: Never Smoker  . Smokeless tobacco: Never Used  . Alcohol use No  . Drug use: No  . Sexual activity: No   Other Topics Concern  . Not on file   Social History Narrative   Single/divorced; no children; she has a friend now   Never smoked   Positive history of passive tobacco smoke exposure.   Exercise- up to 4 times a week   Caffeine- 1-2 per week    Family History  Problem Relation Age of Onset  . Lung cancer Mother     died at age 69-lung cancer, sarcoid  . Stroke Father     11 when passed   . Hypertension Father   .  Stomach cancer Other     3rd cousin   . Colon cancer Neg Hx   . Colon polyps Neg Hx   . Esophageal cancer Neg Hx   . Rectal cancer Neg Hx     ROS: no fevers or chills, productive cough, hemoptysis, dysphasia, odynophagia, melena, hematochezia, dysuria, hematuria, rash, seizure activity, orthopnea, PND, pedal edema, claudication. Remaining systems are negative.  Physical Exam:   Blood pressure (!) 143/98, pulse 80, height 5' 0.25" (1.53 m), weight 121 lb 6.4 oz (55.1 kg), SpO2 98 %.  General:  Well developed/thin in NAD Skin warm/dry Patient not depressed No peripheral clubbing Back-normal HEENT-normal/normal eyelids Neck supple/normal carotid upstroke bilaterally; no bruits; no JVD; no thyromegaly chest - CTA/ normal expansion CV - RRR/normal S1 and S2; no murmurs, rubs or gallops;  PMI nondisplaced Abdomen -NT/ND, no HSM, no mass, + bowel sounds, no bruit 2+ femoral pulses, no bruits Ext-no edema, chords, 2+ DP Neuro-grossly nonfocal  ECG 01/23/2016-atrial flutter at rate of 80.  A/P  1 atrial flutter-patient presents for evaluation of atrial flutter. In reviewing her electrocardiograms she has been in atrial flutter or atrial tachycardia since 2016. Plan echocardiogram to assess LV function. TSH in October normal. Continue bysystolic for rate control. She has embolic risk factors of female sex and hypertension. CHADSvasc 2. Discontinue aspirin and begin apixaban 5 mg BID. Check hemoglobin and renal function in 4 weeks. Note she is borderline for dosing. Her weight is 55 kg, she is 56 years old and her creatinine is 1.47. We will follow this closely. I will refer to electrophysiology for consideration of ablation to avoid long-term anticoagulation.  2 hypertension-her blood pressure is elevated. We will follow this and increase medications as needed.  3 asthma-management per primary care.  Kirk Ruths, MD

## 2016-01-24 NOTE — Patient Instructions (Signed)
Medication Instructions:   STOP ASPIRIN  START ELIQUIS 5 MG ONE TABLET TWICE DAILY  Labwork:  Your physician recommends that you return for lab work in: [place]  Testing/Procedures:  Your physician has requested that you have an echocardiogram. Echocardiography is a painless test that uses sound waves to create images of your heart. It provides your doctor with information about the size and shape of your heart and how well your heart's chambers and valves are working. This procedure takes approximately one hour. There are no restrictions for this procedure.    Follow-Up:  Your physician recommends that you schedule a follow-up appointment in: [place]

## 2016-01-30 ENCOUNTER — Telehealth: Payer: Self-pay | Admitting: Cardiology

## 2016-01-30 NOTE — Telephone Encounter (Signed)
I agree with Erasmo Downer.   We'll defer to Dr. Stanford Breed or Dr. Curt Bears.  Glenetta Hew, MD

## 2016-01-30 NOTE — Telephone Encounter (Signed)
Spoke with patient.  She admits to having a sensitive GI system.  I explained that her GI side effects are not commonly seen with Eliquis and because the alternative is harder to work with, she would do well to continue for at least another week before determining that she cannot tolerate this.    Also explained that bleeding gums are common side effect, she should just floss more regularly, but let us know if the problem persists.    She is to see Dr. Curt Bears tomorrow, weather permitting.

## 2016-01-30 NOTE — Telephone Encounter (Signed)
Pt of Dr. Stanford Breed  Recent A Fib evaluation 1 week ago, put on Eliquis 5mg  BID. She was previously taking ASA.  Spoke to patient, who identifies concerns that her BMs have been more frequent since starting Eliquis, also has had some intermittent abdominal cramping last night and this AM. She's concerned about pale color of stools.  Also had bleeding of gums (patient qualified this as "just a little bit" of bleeding) when brushing her teeth.  Recommended patient follow up to discuss w Dr. Curt Bears as scheduled tomorrow but that I would see what else is advised (she is concerned due to potential inclement weather that she will not be able to make this appt).

## 2016-01-30 NOTE — Telephone Encounter (Signed)
Pt says she have been having discomfort and pain in her stomach last night and this morning.She also noticed that when she brushed her teeth they were bleeding. She says her bowel movements are real pale and she is having 2 to 3 movements a day.She wants to know if she should continue taking the Eliquis 2 times a day?l

## 2016-01-31 ENCOUNTER — Institutional Professional Consult (permissible substitution): Payer: BLUE CROSS/BLUE SHIELD | Admitting: Cardiology

## 2016-01-31 ENCOUNTER — Other Ambulatory Visit: Payer: BLUE CROSS/BLUE SHIELD

## 2016-01-31 NOTE — Telephone Encounter (Signed)
Agree with plan to continue apixaban and fu with Dr Curt Bears for flutter ablation [name]

## 2016-02-05 NOTE — Telephone Encounter (Signed)
Left detailed message for patient to make sure to continue the eliquis until seen on 02-13-16. I ask the patient to call back if she was still having problems.

## 2016-02-07 ENCOUNTER — Encounter: Payer: Self-pay | Admitting: *Deleted

## 2016-02-12 ENCOUNTER — Other Ambulatory Visit: Payer: Self-pay | Admitting: Internal Medicine

## 2016-02-12 DIAGNOSIS — E041 Nontoxic single thyroid nodule: Secondary | ICD-10-CM

## 2016-02-13 ENCOUNTER — Encounter: Payer: Self-pay | Admitting: Cardiology

## 2016-02-13 ENCOUNTER — Other Ambulatory Visit: Payer: Self-pay | Admitting: Cardiology

## 2016-02-13 ENCOUNTER — Encounter: Payer: Self-pay | Admitting: *Deleted

## 2016-02-13 ENCOUNTER — Ambulatory Visit (INDEPENDENT_AMBULATORY_CARE_PROVIDER_SITE_OTHER): Payer: BLUE CROSS/BLUE SHIELD | Admitting: Cardiology

## 2016-02-13 VITALS — BP 150/98 | HR 98 | Ht 60.0 in | Wt 118.0 lb

## 2016-02-13 DIAGNOSIS — I484 Atypical atrial flutter: Secondary | ICD-10-CM | POA: Diagnosis not present

## 2016-02-13 DIAGNOSIS — Z01812 Encounter for preprocedural laboratory examination: Secondary | ICD-10-CM

## 2016-02-13 DIAGNOSIS — Z79899 Other long term (current) drug therapy: Secondary | ICD-10-CM

## 2016-02-13 MED ORDER — FLECAINIDE ACETATE 50 MG PO TABS: 75.0000 mg | ORAL_TABLET | Freq: Two times a day (BID) | ORAL | 6 refills | Status: DC

## 2016-02-13 NOTE — Patient Instructions (Addendum)
Medication Instructions:   Your physician has recommended you make the following change in your medication:  1) START Flecainide 75 mg twice a day -- begin this medication on Thursday 02/15/16  --- If you need a refill on your cardiac medications before your next appointment, please call your pharmacy. ---  Labwork:  None ordered  Testing/Procedures: Your physician has requested that you have an exercise tolerance test between 2/12 - 2/15 secondary to Flecainide start. For further information please visit [email or web address]. Please also follow instruction sheet, as given.  Your physician has recommended that you have a Cardioversion (DCCV). Electrical Cardioversion uses a jolt of electricity to your heart either through paddles or wired patches attached to your chest. This is a controlled, usually prescheduled, procedure. Defibrillation is done under light anesthesia in the hospital, and you usually go home the day of the procedure. This is done to get your heart back into a normal rhythm. You are not awake for the procedure. Please see the instruction sheet given to you today.  Follow-Up:  Your physician recommends that you schedule a follow-up appointment in: 1 month with Dr. Curt Bears  Thank you for choosing [facility]!!   Trinidad Curet, RN [phone]   Any Other Special Instructions Will Be Listed Below (If Applicable).   Electrical Cardioversion Electrical cardioversion is the delivery of a jolt of electricity to restore a normal rhythm to the heart. A rhythm that is too fast or is not regular keeps the heart from pumping well. In this procedure, sticky patches or metal paddles are placed on the chest to deliver electricity to the heart from a device. This procedure may be done in an emergency if:  There is low or no blood pressure as a result of the heart rhythm.  Normal rhythm must be restored as fast as possible to protect the brain and heart from further  damage.  It may save a life. This procedure may also be done for irregular or fast heart rhythms that are not immediately life-threatening. Tell a health care provider about:  Any allergies you have.  All medicines you are taking, including vitamins, herbs, eye drops, creams, and over-the-counter medicines.  Any problems you or family members have had with anesthetic medicines.  Any blood disorders you have.  Any surgeries you have had.  Any medical conditions you have.  Whether you are pregnant or may be pregnant. What are the risks? Generally, this is a safe procedure. However, problems may occur, including:  Allergic reactions to medicines.  A blood clot that breaks free and travels to other parts of your body.  The possible return of an abnormal heart rhythm within hours or days after the procedure.  Your heart stopping (cardiac arrest). This is rare. What happens before the procedure? Medicines  Your health care provider may have you start taking:  Blood-thinning medicines (anticoagulants) so your blood does not clot as easily.  Medicines may be given to help stabilize your heart rate and rhythm.  Ask your health care provider about changing or stopping your regular medicines. This is especially important if you are taking diabetes medicines or blood thinners. General instructions  Plan to have someone take you home from the hospital or clinic.  If you will be going home right after the procedure, plan to have someone with you for 24 hours.  Follow instructions from your health care provider about eating or drinking restrictions. What happens during the procedure?  To lower your risk of infection:  Your health care team will wash or sanitize their hands.  Your skin will be washed with soap.  An IV tube will be inserted into one of your veins.  You will be given a medicine to help you relax (sedative).  Sticky patches (electrodes) or metal paddles may be  placed on your chest.  An electrical shock will be delivered. The procedure may vary among health care providers and hospitals. What happens after the procedure?  Your blood pressure, heart rate, breathing rate, and blood oxygen level will be monitored until the medicines you were given have worn off.  Do not drive for 24 hours if you were given a sedative.  Your heart rhythm will be watched to make sure it does not change. This information is not intended to replace advice given to you by your health care provider. Make sure you discuss any questions you have with your health care provider. Document Released: 12/21/2001 Document Revised: 08/30/2015 Document Reviewed: 07/07/2015 Elsevier Interactive Patient Education  2017 [place].    Exercise Stress Echocardiogram An exercise stress echocardiogram is a test to check how well your heart is working. This test uses sound waves (ultrasound) and a computer to make images of your heart before and after exercise. Ultrasound images that are taken before you exercise (your resting echocardiogram) will show how much blood is getting to your heart muscle and how well your heart muscle and heart valves are functioning. During the next part of this test, you will walk on a treadmill or ride a stationary bike to see how exercise affects your heart. While you exercise, the electrical activity of your heart will be monitored with an electrocardiogram (ECG). Your blood pressure will also be monitored. You may have this test if you:  Have chest pain or other symptoms of a heart problem.  Recently had a heart attack or heart surgery.  Have heart valve problems.  Have a condition that causes narrowing of the blood vessels that supply your heart (coronary artery disease).  Have a high risk of heart disease and are starting a new exercise program.  Have a high risk of heart disease and need to have major surgery. Tell a health care provider  about:  Any allergies you have.  All medicines you are taking, including vitamins, herbs, eye drops, creams, and over-the-counter medicines.  Any problems you or family members have had with anesthetic medicines.  Any blood disorders you have.  Any surgeries you have had.  Any medical conditions you have.  Whether you are pregnant or may be pregnant. What are the risks? Generally, this is a safe procedure. However, problems may occur, including:  Chest pain.  Dizziness or light-headedness.  Shortness of breath.  Increased or irregular heartbeat (palpitations).  Nausea or vomiting.  Heart attack (very rare). What happens before the procedure?  Follow instructions from your health care provider about eating or drinking restrictions. You may be asked to avoid all forms of caffeine for 24 hours before your procedure, or as told by your health care provider.  Ask your health care provider about changing or stopping your regular medicines. This is especially important if you are taking diabetes medicines or blood thinners.  If you use an inhaler, bring it with you to the test.  Wear loose, comfortable clothing and walking shoes.  Do notuse any products that contain nicotine or tobacco, such as cigarettes and e-cigarettes, for 4 hours before the test or as told by your health care provider.  If you need help quitting, ask your health care provider. What happens during the procedure?  You will take off your clothes from the waist up and put on a hospital gown.  A technician will place electrodes on your chest.  A blood pressure cuff will be placed on your arm.  You will lie down on a table for an ultrasound exam before you exercise. Gel will be rubbed on your chest, and a handheld device (transducer) will be pressed against your chest and moved over your heart.  Then, you will start exercising by walking on a treadmill or pedaling a stationary bicycle.  Your blood pressure  and heart rhythm will be monitored while you exercise.  The exercise will gradually get harder or faster.  You will exercise until:  Your heart reaches a target level.  You are too tired to continue.  You cannot continue because of chest pain, weakness, or dizziness.  You will have another ultrasound exam after you stop exercising. The procedure may vary among health care providers and hospitals. What happens after the procedure?  Your heart rate and blood pressure will be monitored until they return to your normal levels. Summary  An exercise stress echocardiogram is a test that uses ultrasound to check how well your heart works before and after exercise.  Before the test, follow instructions from your health care provider about stopping medications, avoiding nicotine and tobacco, and avoiding certain foods and drinks.  During the test, your blood pressure and heart rhythm will be monitored while you exercise on a treadmill or stationary bicycle. This information is not intended to replace advice given to you by your health care provider. Make sure you discuss any questions you have with your health care provider. Document Released: 01/05/2004 Document Revised: 08/23/2015 Document Reviewed: 08/23/2015 Elsevier Interactive Patient Education  2017 Elsevier Inc.    Flecainide tablets What is this medicine? FLECAINIDE (FLEK a nide) is an antiarrhythmic drug. This medicine is used to prevent irregular heart rhythm. It can also slow down fast heartbeats called tachycardia. This medicine may be used for other purposes; ask your health care provider or pharmacist if you have questions. COMMON BRAND NAME(S): Tambocor What should I tell my health care provider before I take this medicine? They need to know if you have any of these conditions: -abnormal levels of potassium in the blood -heart disease including heart rhythm and heart rate problems -kidney or liver disease -recent heart  attack -an unusual or allergic reaction to flecainide, local anesthetics, other medicines, foods, dyes, or preservatives -pregnant or trying to get pregnant -breast-feeding How should I use this medicine? Take this medicine by mouth with a glass of water. Follow the directions on the prescription label. You can take this medicine with or without food. Take your doses at regular intervals. Do not take your medicine more often than directed. Do not stop taking this medicine suddenly. This may cause serious, heart-related side effects. If your doctor wants you to stop the medicine, the dose may be slowly lowered over time to avoid any side effects. Talk to your pediatrician regarding the use of this medicine in children. While this drug may be prescribed for children as young as 1 year of age for selected conditions, precautions do apply. Overdosage: If you think you have taken too much of this medicine contact a poison control center or emergency room at once. NOTE: This medicine is only for you. Do not share this medicine with others. What if I  miss a dose? If you miss a dose, take it as soon as you can. If it is almost time for your next dose, take only that dose. Do not take double or extra doses. What may interact with this medicine? Do not take this medicine with any of the following medications: -amoxapine -arsenic trioxide -certain antibiotics like clarithromycin, erythromycin, gatifloxacin, gemifloxacin, levofloxacin, moxifloxacin, sparfloxacin, or troleandomycin -certain antidepressants called tricyclic antidepressants like amitriptyline, imipramine, or nortriptyline -certain medicines to control heart rhythm like disopyramide, dofetilide, encainide, moricizine, procainamide, propafenone, and quinidine -cisapride -cyclobenzaprine -delavirdine -droperidol -haloperidol -hawthorn -imatinib -levomethadyl -maprotiline -medicines for malaria like chloroquine and  halofantrine -pentamidine -phenothiazines like chlorpromazine, mesoridazine, prochlorperazine, thioridazine -pimozide -quinine -ranolazine -ritonavir -sertindole -ziprasidone This medicine may also interact with the following medications: -cimetidine -medicines for angina or high blood pressure -medicines to control heart rhythm like amiodarone and digoxin This list may not describe all possible interactions. Give your health care provider a list of all the medicines, herbs, non-prescription drugs, or dietary supplements you use. Also tell them if you smoke, drink alcohol, or use illegal drugs. Some items may interact with your medicine. What should I watch for while using this medicine? Visit your doctor or health care professional for regular checks on your progress. Because your condition and the use of this medicine carries some risk, it is a good idea to carry an identification card, necklace or bracelet with details of your condition, medications and doctor or health care professional. Check your blood pressure and pulse rate regularly. Ask your health care professional what your blood pressure and pulse rate should be, and when you should contact him or her. Your doctor or health care professional also may schedule regular blood tests and electrocardiograms to check your progress. You may get drowsy or dizzy. Do not drive, use machinery, or do anything that needs mental alertness until you know how this medicine affects you. Do not stand or sit up quickly, especially if you are an older patient. This reduces the risk of dizzy or fainting spells. Alcohol can make you more dizzy, increase flushing and rapid heartbeats. Avoid alcoholic drinks. What side effects may I notice from receiving this medicine? Side effects that you should report to your doctor or health care professional as soon as possible: -chest pain, continued irregular heartbeats -difficulty breathing -swelling of the legs or  feet -trembling, shaking -unusually weak or tired Side effects that usually do not require medical attention (report to your doctor or health care professional if they continue or are bothersome): -blurred vision -constipation -headache -nausea, vomiting -stomach pain This list may not describe all possible side effects. Call your doctor for medical advice about side effects. You may report side effects to FDA at 1-800-FDA-1088. Where should I keep my medicine? Keep out of the reach of children. Store at room temperature between 15 and 30 degrees C (59 and 86 degrees F). Protect from light. Keep container tightly closed. Throw away any unused medicine after the expiration date. NOTE: This sheet is a summary. It may not cover all possible information. If you have questions about this medicine, talk to your doctor, pharmacist, or health care provider.  2017 Elsevier/Gold Standard (2007-05-06 16:46:09)

## 2016-02-13 NOTE — Addendum Note (Signed)
Addended by: Stanton Kidney on: 02/13/2016 03:03 PM   Modules accepted: Orders

## 2016-02-13 NOTE — Progress Notes (Signed)
Electrophysiology Office Note   Date:  02/13/2016   ID:  [name], DOB 07-Nov-1960, MRN [id number]  PCP:  Walker Kehr, MD  Cardiologist:  Stanford Breed Primary Electrophysiologist:  Khaidyn Staebell Meredith Leeds, MD    Chief Complaint  Patient presents with  . Advice Only    AFlutter     History of Present Illness: [name] is a 56 y.o. female who presents today for electrophysiology evaluation.   She has a history of atrial flutter, depression, hypertension has had intermittent dyspnea on exertion since 2016. She does have occasional palpitations.She says that she also has low blood pressures at times and feels weak and fatigued. She started her Eliquis on January 10. Otherwise she is able to do most of her daily activities.   Today, she denies symptoms of chest pain, shortness of breath, orthopnea, PND, lower extremity edema, claudication, dizziness, presyncope, syncope, bleeding, or neurologic sequela. The patient is tolerating medications without difficulties and is otherwise without complaint today.    Past Medical History:  Diagnosis Date  . Allergy   . Anxiety   . Asthma   . Atrial flutter ([place])   . Cataract   . Depression    pt states no depression 07-11-15  . Glaucoma   . Heart murmur    as child only  . Herpes simplex   . Hypertension   . Multiple thyroid nodules   . Osteoarthritis   . Osteoporosis   . PUD (peptic ulcer disease)   . Pulmonary sarcoidosis ([place])   . Vitamin D deficiency    Past Surgical History:  Procedure Laterality Date  . BUNIONECTOMY     x2  . CATARACT EXTRACTION Right   . DENTAL SURGERY    . DILATION AND CURETTAGE OF UTERUS    . fibroid tumor removal    . GLAUCOMA SURGERY     Right eye  . MYOMECTOMY       Current Outpatient Prescriptions  Medication Sig Dispense Refill  . acyclovir (ZOVIRAX) 200 MG capsule Take 200 mg by mouth 2 (two) times daily.    [name] amLODipine (NORVASC) 5 MG tablet TAKE 1 TABLET BY MOUTH DAILY (Patient  taking differently: take 1/4 of a tablet by mouth daily.) 30 tablet 11  . apixaban (ELIQUIS) 5 MG TABS tablet Take 1 tablet (5 mg total) by mouth 2 (two) times daily. 60 tablet 6  . Blood Glucose Monitoring Suppl (ONE TOUCH ULTRA SYSTEM KIT) W/DEVICE KIT 1 kit by Does not apply route as directed. 1 each 0  . BYSTOLIC 20 MG TABS TAKE 2 TABLETS BY MOUTH AS DIRECTED 60 tablet 4  . dorzolamide-timolol (COSOPT) 22.3-6.8 MG/ML ophthalmic solution Place 1 drop into the left eye daily.   12  . fluticasone (FLONASE) 50 MCG/ACT nasal spray USE 2 SPRAYS IN EACH NOSTRIL ONCE DAILY 16 g 6  . glucose blood test strip 1 each by Other route as directed. 100 each 3  . loratadine (CLARITIN) 10 MG tablet Take 10 mg by mouth daily.    [name] losartan (COZAAR) 100 MG tablet TAKE 1 TABLET BY MOUTH ONCE DAILY 90 tablet 3  . Omega-3 300 MG CAPS Take 600 mg by mouth daily.    . ONE TOUCH LANCETS MISC 1 each by Does not apply route as directed. 100 each 3  . potassium bicarbonate (KLOR-CON/EF) 25 MEQ disintegrating tablet DISSOLVE ONE TABLET IN THE MOUTH TWICE A DAY 60 tablet 11  . Probiotic Product (PROBIOTIC DAILY PO) Take 1 each  by mouth daily.    . vitamin B-12 (CYANOCOBALAMIN) 100 MCG tablet Take 100 mcg by mouth daily. Reported on 06/20/2015     No current facility-administered medications for this visit.     Allergies:   Amoxicillin-pot clavulanate; Cardizem [diltiazem hcl]; Carvedilol; Cefdinir; Cephalexin; Hydrochlorothiazide; Influenza vaccines; Prednisone; Sulfamethoxazole; Sulfonamide derivatives; and Verapamil   Social History:  The patient  reports that she has never smoked. She has never used smokeless tobacco. She reports that she does not drink alcohol or use drugs.   Family History:  The patient's family history includes Heart Problems in her father; Hypertension in her father; Lung cancer in her mother; Stomach cancer in her cousin; Stroke in her father.    ROS:  Please see the history of present illness.    Otherwise, review of systems is positive for Weight loss, appetite change, chills, fatigue, palpitations, cough, dyspnea on exertion, snoring, abdominal pain.   All other systems are reviewed and negative.    PHYSICAL EXAM: VS:  BP (!) 150/98 (BP Location: Right Arm)   Pulse 98   Ht 5' (1.524 m)   Wt 118 lb (53.5 kg)   BMI 23.05 kg/m  , BMI Body mass index is 23.05 kg/m. GEN: Well nourished, well developed, in no acute distress  HEENT: normal  Neck: no JVD, carotid bruits, or masses Cardiac: iRRR; no murmurs, rubs, or gallops,no edema  Respiratory:  clear to auscultation bilaterally, normal work of breathing GI: soft, nontender, nondistended, + BS MS: no deformity or atrophy  Skin: warm and dry Neuro:  Strength and sensation are intact Psych: euthymic mood, full affect  EKG:  EKG is not ordered today. Personal review of the ekg ordered 01/23/16 shows atrial flutter, rate 80  Recent Labs: 10/24/2015: ALT 20; BUN 19; Creatinine, Ser 1.47; Potassium 4.0; Sodium 138; TSH 3.42    Lipid Panel     Component Value Date/Time   CHOL 150 01/25/2013 0840   TRIG 55.0 01/25/2013 0840   TRIG 86 01/02/2006 0927   HDL 38.90 (L) 01/25/2013 0840   CHOLHDL 4 01/25/2013 0840   VLDL 11.0 01/25/2013 0840   LDLCALC 100 (H) 01/25/2013 0840     Wt Readings from Last 3 Encounters:  02/13/16 118 lb (53.5 kg)  01/24/16 121 lb 6.4 oz (55.1 kg)  01/23/16 123 lb 4 oz (55.9 kg)      Other studies Reviewed: Additional studies/ records that were reviewed today include: TTE 01/24/16  Review of the above records today demonstrates:  - Left ventricle: The cavity size was normal. Wall thickness was   normal. Systolic function was normal. The estimated ejection   fraction was in the range of 55% to 60%. Wall motion was normal;   there were no regional wall motion abnormalities. - Mitral valve: There was mild to moderate regurgitation.   ASSESSMENT AND PLAN:  1.  Atypical atrial flutter: It  appears that she has been in atrial flutter since 2016. She is currently anticoagulated with Coumadin. Unfortunately, it appears that her heart atrial flutter is atypical and possibly left-sided with positive F waves in the inferior leads and V1 with negative F waves in aVL. Plan for cardioversion as it appears that she is in an atypical atrial flutter. We'll start her on 75 mg of flecainide as well. We Gerarda Conklin schedule her for stress testing based on flecainide initiation.  2. Hypertension: Elevated today but has been low in the past. We'll reassess post cardioversion  Current medicines are reviewed at length  with the patient today.   The patient does not have concerns regarding her medicines.  The following changes were made today:  Start flecainide  Labs/ tests ordered today include:  No orders of the defined types were placed in this encounter.    Disposition:   FU with Amee Boothe 1 month  Signed, Bradee Common Meredith Leeds, MD  02/13/2016 2:23 PM     [place] [address] [phone] (office) [phone] (fax)

## 2016-02-13 NOTE — Addendum Note (Signed)
Addended by: Stanton Kidney on: 02/13/2016 03:26 PM   Modules accepted: Orders

## 2016-02-14 LAB — CBC WITH DIFFERENTIAL/PLATELET
Basophils Absolute: 0.1 10*3/uL (ref 0.0–0.2)
Basos: 1 %
EOS (ABSOLUTE): 0.3 10*3/uL (ref 0.0–0.4)
Eos: 4 %
HEMOGLOBIN: 14.6 g/dL (ref 11.1–15.9)
Hematocrit: 42 % (ref 34.0–46.6)
IMMATURE GRANS (ABS): 0.1 10*3/uL (ref 0.0–0.1)
IMMATURE GRANULOCYTES: 1 %
Lymphocytes Absolute: 1.9 10*3/uL (ref 0.7–3.1)
Lymphs: 24 %
MCH: 30.5 pg (ref 26.6–33.0)
MCHC: 34.8 g/dL (ref 31.5–35.7)
MCV: 88 fL (ref 79–97)
MONOCYTES: 14 %
Monocytes Absolute: 1.1 10*3/uL — ABNORMAL HIGH (ref 0.1–0.9)
NEUTROS PCT: 56 %
Neutrophils Absolute: 4.6 10*3/uL (ref 1.4–7.0)
PLATELETS: 350 10*3/uL (ref 150–379)
RBC: 4.78 x10E6/uL (ref 3.77–5.28)
RDW: 14.4 % (ref 12.3–15.4)
WBC: 8 10*3/uL (ref 3.4–10.8)

## 2016-02-16 ENCOUNTER — Other Ambulatory Visit: Payer: BLUE CROSS/BLUE SHIELD

## 2016-02-19 ENCOUNTER — Ambulatory Visit
Admission: RE | Admit: 2016-02-19 | Discharge: 2016-02-19 | Disposition: A | Discharge status: Home / Self Care | Payer: BLUE CROSS/BLUE SHIELD | Source: Ambulatory Visit | Attending: Internal Medicine | Admitting: Internal Medicine

## 2016-02-19 ENCOUNTER — Telehealth: Payer: Self-pay | Admitting: Cardiology

## 2016-02-19 DIAGNOSIS — E041 Nontoxic single thyroid nodule: Secondary | ICD-10-CM

## 2016-02-19 NOTE — Telephone Encounter (Signed)
New Message     Does the pt still need to have lab work done Wednesday?  She had labs done twice last week with dr Curt Bears?

## 2016-02-19 NOTE — Telephone Encounter (Signed)
Dr. Stanford Breed had ordered BMET and CBC, still pending. Pt recently had CBC w/diff and CMET ordered by Dr. Curt Bears.  Advised likely no need to get duplicate labwork, will inform patient if different.

## 2016-02-20 NOTE — Telephone Encounter (Signed)
No need for labs Kirk Ruths

## 2016-02-20 NOTE — Telephone Encounter (Signed)
Left message for patient of dr crenshaw's recommendations. 

## 2016-02-21 ENCOUNTER — Telehealth: Payer: Self-pay | Admitting: Cardiology

## 2016-02-21 NOTE — Telephone Encounter (Signed)
New message    Pt verbalized that  her endoscopy 02/22/2016 will she have a diet restrictions   Call after 1:30pm pt will be at church

## 2016-02-21 NOTE — Telephone Encounter (Signed)
Instructed NPO after MN tonight for her DCCV. She also asked about restrictions for GXT next week -- instructed her NPO 4 hr prior to testing. Advised she has no restrictions after these procedures. Patient verbalized understanding and agreeable to plan.

## 2016-02-22 ENCOUNTER — Telehealth: Payer: Self-pay | Admitting: Cardiology

## 2016-02-22 ENCOUNTER — Encounter (HOSPITAL_COMMUNITY)
Admission: RE | Disposition: A | Discharge status: Home / Self Care | Payer: Self-pay | Source: Ambulatory Visit | Attending: Internal Medicine

## 2016-02-22 ENCOUNTER — Encounter (HOSPITAL_COMMUNITY): Payer: Self-pay | Admitting: *Deleted

## 2016-02-22 ENCOUNTER — Ambulatory Visit (HOSPITAL_COMMUNITY)
Admission: RE | Admit: 2016-02-22 | Discharge: 2016-02-22 | Disposition: A | Discharge status: Home / Self Care | Payer: BLUE CROSS/BLUE SHIELD | Source: Ambulatory Visit | Attending: Internal Medicine | Admitting: Internal Medicine

## 2016-02-22 ENCOUNTER — Encounter (HOSPITAL_COMMUNITY): Payer: Self-pay | Admitting: Anesthesiology

## 2016-02-22 DIAGNOSIS — Z539 Procedure and treatment not carried out, unspecified reason: Secondary | ICD-10-CM | POA: Diagnosis not present

## 2016-02-22 SURGERY — CANCELLED PROCEDURE

## 2016-02-22 MED ORDER — SODIUM CHLORIDE 0.9 % IV SOLN: INTRAVENOUS | Status: DC

## 2016-02-22 NOTE — Telephone Encounter (Signed)
Advised she needs to continue w/ GXT secondary to Flecainide start. Patient verbalized understanding and agreeable to plan.

## 2016-02-22 NOTE — H&P (Signed)
02/13/2016 2:00 PM  Office Visit  MRN:  [id number]  Description: Female DOB: 1960/03/19 Provider: Constance Haw, MD Department: [facility]  Vitals   BP    150/98 (BP Location: Right Arm)   Pulse  98   Ht  5' (1.524 m)   Wt  118 lb (53.5 kg)   BMI  23.05 kg/m      Vitals History  Progress Notes   Will Meredith Leeds, MD at 02/13/2016 2:00 PM   Status: Signed  Expand All Collapse All      Electrophysiology Office Note   Date:  02/13/2016   ID:  [name], DOB 09-22-1960, MRN [id number]  PCP:  Walker Kehr, MD        Cardiologist:  Stanford Breed Primary Electrophysiologist:  Will Meredith Leeds, MD           Chief Complaint  Patient presents with  . Advice Only    AFlutter     History of Present Illness: [name] is a 56 y.o. female who presents today for electrophysiology evaluation.   She has a history of atrial flutter, depression, hypertension has had intermittent dyspnea on exertion since 2016. She does have occasional palpitations.She says that she also has low blood pressures at times and feels weak and fatigued. She started her Eliquis on January 10. Otherwise she is able to do most of her daily activities.   Today, she denies symptoms of chest pain, shortness of breath, orthopnea, PND, lower extremity edema, claudication, dizziness, presyncope, syncope, bleeding, or neurologic sequela. The patient is tolerating medications without difficulties and is otherwise without complaint today.        Past Medical History:  Diagnosis Date  . Allergy   . Anxiety   . Asthma   . Atrial flutter ([place])   . Cataract   . Depression    pt states no depression 07-11-15  . Glaucoma   . Heart murmur    as child only  . Herpes simplex   . Hypertension   . Multiple thyroid nodules   . Osteoarthritis   . Osteoporosis   . PUD (peptic ulcer disease)   . Pulmonary sarcoidosis ([place])   . Vitamin D deficiency      Past Surgical History:  Procedure Laterality Date  . BUNIONECTOMY     x2  . CATARACT EXTRACTION Right   . DENTAL SURGERY    . DILATION AND CURETTAGE OF UTERUS    . fibroid tumor removal    . GLAUCOMA SURGERY     Right eye  . MYOMECTOMY             Current Outpatient Prescriptions  Medication Sig Dispense Refill  . acyclovir (ZOVIRAX) 200 MG capsule Take 200 mg by mouth 2 (two) times daily.    [name] amLODipine (NORVASC) 5 MG tablet TAKE 1 TABLET BY MOUTH DAILY (Patient taking differently: take 1/4 of a tablet by mouth daily.) 30 tablet 11  . apixaban (ELIQUIS) 5 MG TABS tablet Take 1 tablet (5 mg total) by mouth 2 (two) times daily. 60 tablet 6  . Blood Glucose Monitoring Suppl (ONE TOUCH ULTRA SYSTEM KIT) W/DEVICE KIT 1 kit by Does not apply route as directed. 1 each 0  . BYSTOLIC 20 MG TABS TAKE 2 TABLETS BY MOUTH AS DIRECTED 60 tablet 4  . dorzolamide-timolol (COSOPT) 22.3-6.8 MG/ML ophthalmic solution Place 1 drop into the left eye daily.   12  . fluticasone (FLONASE) 50 MCG/ACT nasal spray USE 2  SPRAYS IN EACH NOSTRIL ONCE DAILY 16 g 6  . glucose blood test strip 1 each by Other route as directed. 100 each 3  . loratadine (CLARITIN) 10 MG tablet Take 10 mg by mouth daily.    [name] losartan (COZAAR) 100 MG tablet TAKE 1 TABLET BY MOUTH ONCE DAILY 90 tablet 3  . Omega-3 300 MG CAPS Take 600 mg by mouth daily.    . ONE TOUCH LANCETS MISC 1 each by Does not apply route as directed. 100 each 3  . potassium bicarbonate (KLOR-CON/EF) 25 MEQ disintegrating tablet DISSOLVE ONE TABLET IN THE MOUTH TWICE A DAY 60 tablet 11  . Probiotic Product (PROBIOTIC DAILY PO) Take 1 each by mouth daily.    . vitamin B-12 (CYANOCOBALAMIN) 100 MCG tablet Take 100 mcg by mouth daily. Reported on 06/20/2015     No current facility-administered medications for this visit.     Allergies:   Amoxicillin-pot clavulanate; Cardizem [diltiazem hcl]; Carvedilol; Cefdinir; Cephalexin;  Hydrochlorothiazide; Influenza vaccines; Prednisone; Sulfamethoxazole; Sulfonamide derivatives; and Verapamil   Social History:  The patient  reports that she has never smoked. She has never used smokeless tobacco. She reports that she does not drink alcohol or use drugs.   Family History:  The patient's family history includes Heart Problems in her father; Hypertension in her father; Lung cancer in her mother; Stomach cancer in her cousin; Stroke in her father.    ROS:  Please see the history of present illness.   Otherwise, review of systems is positive for Weight loss, appetite change, chills, fatigue, palpitations, cough, dyspnea on exertion, snoring, abdominal pain.   All other systems are reviewed and negative.    PHYSICAL EXAM: VS:  BP (!) 150/98 (BP Location: Right Arm)   Pulse 98   Ht 5' (1.524 m)   Wt 118 lb (53.5 kg)   BMI 23.05 kg/m  , BMI Body mass index is 23.05 kg/m. GEN: Well nourished, well developed, in no acute distress  HEENT: normal  Neck: no JVD, carotid bruits, or masses Cardiac: iRRR; no murmurs, rubs, or gallops,no edema  Respiratory:  clear to auscultation bilaterally, normal work of breathing GI: soft, nontender, nondistended, + BS MS: no deformity or atrophy  Skin: warm and dry Neuro:  Strength and sensation are intact Psych: euthymic mood, full affect  EKG:  EKG is not ordered today. Personal review of the ekg ordered 01/23/16 shows atrial flutter, rate 80  Recent Labs: 10/24/2015: ALT 20; BUN 19; Creatinine, Ser 1.47; Potassium 4.0; Sodium 138; TSH 3.42    Lipid Panel  Labs (Brief)          Component Value Date/Time   CHOL 150 01/25/2013 0840   TRIG 55.0 01/25/2013 0840   TRIG 86 01/02/2006 0927   HDL 38.90 (L) 01/25/2013 0840   CHOLHDL 4 01/25/2013 0840   VLDL 11.0 01/25/2013 0840   LDLCALC 100 (H) 01/25/2013 0840          Wt Readings from Last 3 Encounters:  02/13/16 118 lb (53.5 kg)  01/24/16 121 lb 6.4 oz (55.1  kg)  01/23/16 123 lb 4 oz (55.9 kg)      Other studies Reviewed: Additional studies/ records that were reviewed today include: TTE 01/24/16  Review of the above records today demonstrates:  - Left ventricle: The cavity size was normal. Wall thickness was normal. Systolic function was normal. The estimated ejection fraction was in the range of 55% to 60%. Wall motion was normal; there were no regional  wall motion abnormalities. - Mitral valve: There was mild to moderate regurgitation.   ASSESSMENT AND PLAN:  1.  Atypical atrial flutter: It appears that she has been in atrial flutter since 2016. She is currently anticoagulated with Coumadin. Unfortunately, it appears that her heart atrial flutter is atypical and possibly left-sided with positive F waves in the inferior leads and V1 with negative F waves in aVL. Plan for cardioversion as it appears that she is in an atypical atrial flutter. We'll start her on 75 mg of flecainide as well. We will schedule her for stress testing based on flecainide initiation.  2. Hypertension: Elevated today but has been low in the past. We'll reassess post cardioversion  Current medicines are reviewed at length with the patient today.   The patient does not have concerns regarding her medicines.  The following changes were made today:  Start flecainide  Labs/ tests ordered today include:  No orders of the defined types were placed in this encounter.    Disposition:   FU with Will Camnitz 1 month  Signed, Will Meredith Leeds, MD  02/13/2016 2:23 PM     [place] [address] [phone] (office) [phone] (fax)      Pt has been compliant with apixaban; for DCCV. Kirk Ruths

## 2016-02-22 NOTE — Telephone Encounter (Signed)
New Message  Pt call requesting to speak with RN. Pt states her heart is back in rhythm and back to normal per Dr. Stanford Breed. Pt states she did not have to complete cardioversion today and would like to know if stress test for Monday can be canceled. Please call back to discuss

## 2016-02-22 NOTE — Anesthesia Preprocedure Evaluation (Deleted)
Anesthesia Evaluation  Patient identified by MRN, date of birth, ID band Patient awake    Reviewed: Allergy & Precautions, NPO status , Patient's Chart, lab work & pertinent test results  History of Anesthesia Complications Negative for: history of anesthetic complications  Airway        Dental   Pulmonary asthma ,    breath sounds clear to auscultation       Cardiovascular hypertension, + Valvular Problems/Murmurs  Rhythm:Irregular Rate:Normal     Neuro/Psych    GI/Hepatic PUD,   Endo/Other    Renal/GU      Musculoskeletal  (+) Arthritis ,   Abdominal   Peds  Hematology   Anesthesia Other Findings   Reproductive/Obstetrics                             Anesthesia Physical Anesthesia Plan  ASA: III  Anesthesia Plan: General   Post-op Pain Management:    Induction: Intravenous  Airway Management Planned: Mask  Additional Equipment:   Intra-op Plan:   Post-operative Plan:   Informed Consent: I have reviewed the patients History and Physical, chart, labs and discussed the procedure including the risks, benefits and alternatives for the proposed anesthesia with the patient or authorized representative who has indicated his/her understanding and acceptance.     Plan Discussed with:   Anesthesia Plan Comments:         Anesthesia Quick Evaluation

## 2016-02-22 NOTE — Procedures (Signed)
Pt presented for cardioversion; ECG shows sinus with first degree AV block; procedure cancelled; continue flecanide; DC bystolic given markedly prolonged PR interval; follow BP and adjust as regimen as needed. Kirk Ruths

## 2016-02-26 ENCOUNTER — Ambulatory Visit: Payer: BLUE CROSS/BLUE SHIELD

## 2016-02-27 ENCOUNTER — Telehealth: Payer: Self-pay | Admitting: Cardiology

## 2016-02-27 NOTE — Telephone Encounter (Signed)
New Message    Pt states that she never received phone call for her treadmill results

## 2016-02-27 NOTE — Telephone Encounter (Signed)
Does not need encounter

## 2016-02-27 NOTE — Telephone Encounter (Signed)
Spoke to patient.  She was at [facility] for testing yesterday.  She states she got really dizzy, weak, was told to be NPO day of stress test, she feels she got dehydrated from not drinking water, and had low CBG from not eating. She went home after ETT was cancelled/moved due to broken treadmill, felt better after eating and drinking.  I spoke w nuclear dept here, they state for plain ETT pt can eat and drink up til time of test. If she has nuclear stress test, she can still drink fluids until time of test, needs to be NPO for food 4 hrs prior. I communicated these instructions to her, she voiced understanding. Advised to go over this w person who calls her w preprocedure instructions day prior to testing.  Also she notes she called because she wanted to reschedule ETT. She states 10am is fine but she needs a different day. Requested 2/22 if available. Aware I'll see if Rollene Fare can reach out to her for schedule options.

## 2016-02-29 ENCOUNTER — Ambulatory Visit (INDEPENDENT_AMBULATORY_CARE_PROVIDER_SITE_OTHER)
Admission: RE | Admit: 2016-02-29 | Discharge: 2016-02-29 | Disposition: A | Discharge status: Home / Self Care | Payer: BLUE CROSS/BLUE SHIELD | Source: Ambulatory Visit | Attending: Internal Medicine | Admitting: Internal Medicine

## 2016-02-29 ENCOUNTER — Encounter: Payer: Self-pay | Admitting: Internal Medicine

## 2016-02-29 ENCOUNTER — Ambulatory Visit (INDEPENDENT_AMBULATORY_CARE_PROVIDER_SITE_OTHER): Payer: BLUE CROSS/BLUE SHIELD | Admitting: Internal Medicine

## 2016-02-29 VITALS — BP 136/86 | HR 86 | Temp 97.9°F | Resp 16 | Ht 60.0 in | Wt 121.8 lb

## 2016-02-29 DIAGNOSIS — R634 Abnormal weight loss: Secondary | ICD-10-CM

## 2016-02-29 DIAGNOSIS — J4540 Moderate persistent asthma, uncomplicated: Secondary | ICD-10-CM

## 2016-02-29 DIAGNOSIS — I4891 Unspecified atrial fibrillation: Secondary | ICD-10-CM | POA: Diagnosis not present

## 2016-02-29 DIAGNOSIS — R7989 Other specified abnormal findings of blood chemistry: Secondary | ICD-10-CM

## 2016-02-29 DIAGNOSIS — I10 Essential (primary) hypertension: Secondary | ICD-10-CM | POA: Diagnosis not present

## 2016-02-29 DIAGNOSIS — R945 Abnormal results of liver function studies: Secondary | ICD-10-CM

## 2016-02-29 NOTE — Assessment & Plan Note (Addendum)
Off asmanex,  Off Dulera due to PAF

## 2016-02-29 NOTE — Assessment & Plan Note (Signed)
Chronic. 

## 2016-02-29 NOTE — Assessment & Plan Note (Signed)
Eliquis, Flecainide  

## 2016-02-29 NOTE — Assessment & Plan Note (Signed)
47 lbs in 6 mo CXR Abd [place] RTC 3 mo Labs w/[name] (Jan 2018) - reviewed

## 2016-02-29 NOTE — Assessment & Plan Note (Signed)
Off Bystolic

## 2016-02-29 NOTE — Progress Notes (Signed)
Subjective:  Patient ID: [name], female    DOB: Jun 11, 1960  Age: 56 y.o. MRN: [id number]  CC: Follow-up (hypertension, OA, Sarcoidosis, Afib )   HPI [name] presents for A flutter, HTN, elev ca f/u. C/o wt loss - 47 lbs since last summer. TSH ok - seeing [name] for hyercalcemia  Outpatient Medications Prior to Visit  Medication Sig Dispense Refill  . acyclovir (ZOVIRAX) 200 MG capsule Take 200 mg by mouth 2 (two) times daily.    [name] amLODipine (NORVASC) 5 MG tablet TAKE 1 TABLET BY MOUTH DAILY (Patient taking differently: take 1/4 of a tablet by mouth daily.) 30 tablet 11  . apixaban (ELIQUIS) 5 MG TABS tablet Take 1 tablet (5 mg total) by mouth 2 (two) times daily. 60 tablet 6  . Blood Glucose Monitoring Suppl (ONE TOUCH ULTRA SYSTEM KIT) W/DEVICE KIT 1 kit by Does not apply route as directed. 1 each 0  . dorzolamide-timolol (COSOPT) 22.3-6.8 MG/ML ophthalmic solution Place 1 drop into the left eye daily.   12  . flecainide (TAMBOCOR) 50 MG tablet Take 1.5 tablets (75 mg total) by mouth 2 (two) times daily. 45 tablet 6  . fluticasone (FLONASE) 50 MCG/ACT nasal spray USE 2 SPRAYS IN EACH NOSTRIL ONCE DAILY (Patient taking differently: USE 2 SPRAYS IN EACH NOSTRIL as needed) 16 g 6  . glucose blood test strip 1 each by Other route as directed. 100 each 3  . loratadine (CLARITIN) 10 MG tablet Take 10 mg by mouth daily.    [name] losartan (COZAAR) 100 MG tablet TAKE 1 TABLET BY MOUTH ONCE DAILY 90 tablet 3  . Omega-3 300 MG CAPS Take 600 mg by mouth daily.    . ONE TOUCH LANCETS MISC 1 each by Does not apply route as directed. 100 each 3  . potassium bicarbonate (KLOR-CON/EF) 25 MEQ disintegrating tablet DISSOLVE ONE TABLET IN THE MOUTH TWICE A DAY 60 tablet 11  . Probiotic Product (PROBIOTIC DAILY PO) Take 1 each by mouth daily.    . vitamin B-12 (CYANOCOBALAMIN) 100 MCG tablet Take 100 mcg by mouth daily. Reported on 06/20/2015    . BYSTOLIC 20 MG TABS TAKE 2 TABLETS BY MOUTH AS  DIRECTED 60 tablet 4   No facility-administered medications prior to visit.     ROS Review of Systems  Constitutional: Positive for fatigue and unexpected weight change. Negative for activity change, appetite change and chills.  HENT: Negative for congestion, mouth sores and sinus pressure.   Eyes: Negative for visual disturbance.  Respiratory: Negative for cough and chest tightness.   Cardiovascular: Positive for palpitations.  Gastrointestinal: Negative for abdominal pain and nausea.  Genitourinary: Negative for difficulty urinating, frequency and vaginal pain.  Musculoskeletal: Negative for back pain and gait problem.  Skin: Negative for pallor and rash.  Neurological: Negative for dizziness, tremors, weakness, numbness and headaches.  Psychiatric/Behavioral: Negative for confusion, sleep disturbance and suicidal ideas. The patient is nervous/anxious.     Objective:  BP 136/86   Pulse 86   Temp 97.9 F (36.6 C) (Oral)   Resp 16   Ht 5' (1.524 m)   Wt 121 lb 12 oz (55.2 kg)   SpO2 98%   BMI 23.78 kg/m   BP Readings from Last 3 Encounters:  02/29/16 136/86  02/13/16 (!) 150/98  01/24/16 (!) 143/98    Wt Readings from Last 3 Encounters:  02/29/16 121 lb 12 oz (55.2 kg)  02/13/16 118 lb (53.5 kg)  01/24/16  121 lb 6.4 oz (55.1 kg)    Physical Exam  Constitutional: She appears well-developed. No distress.  HENT:  Head: Normocephalic.  Right Ear: External ear normal.  Left Ear: External ear normal.  Nose: Nose normal.  Mouth/Throat: Oropharynx is clear and moist.  Eyes: Conjunctivae are normal. Pupils are equal, round, and reactive to light. Right eye exhibits no discharge. Left eye exhibits no discharge.  Neck: Normal range of motion. Neck supple. No JVD present. No tracheal deviation present. No thyromegaly present.  Cardiovascular: Normal rate, regular rhythm and normal heart sounds.   Pulmonary/Chest: No stridor. No respiratory distress. She has no wheezes.    Abdominal: Soft. Bowel sounds are normal. She exhibits no distension and no mass. There is no tenderness. There is no rebound and no guarding.  Musculoskeletal: She exhibits no edema or tenderness.  Lymphadenopathy:    She has no cervical adenopathy.  Neurological: She displays normal reflexes. No cranial nerve deficit. She exhibits normal muscle tone. Coordination normal.  Skin: No rash noted. No erythema.  Psychiatric: She has a normal mood and affect. Her behavior is normal. Judgment and thought content normal.  clothes are too big on [name]  Lab Results  Component Value Date   WBC 8.0 02/13/2016   HGB 15.0 12/02/2014   HCT 42.0 02/13/2016   PLT 350 02/13/2016   GLUCOSE 88 10/24/2015   CHOL 150 01/25/2013   TRIG 55.0 01/25/2013   HDL 38.90 (L) 01/25/2013   LDLCALC 100 (H) 01/25/2013   ALT 20 10/24/2015   AST 20 10/24/2015   NA 138 10/24/2015   K 4.0 10/24/2015   CL 100 10/24/2015   CREATININE 1.47 (H) 10/24/2015   BUN 19 10/24/2015   CO2 29 10/24/2015   TSH 3.42 10/24/2015   HGBA1C 5.9 05/18/2013    US Thyroid  Result Date: 02/19/2016 CLINICAL DATA:  Nodules EXAM: THYROID ULTRASOUND TECHNIQUE: Ultrasound examination of the thyroid gland and adjacent soft tissues was performed. COMPARISON:  03/24/2015 FINDINGS: Parenchymal Echotexture: Mildly heterogenous Isthmus: 0.2 cm thickness, stable Right lobe: 3.6 x 1.5 x 1.2 cm (previously 4.1 x 2.2 x 1.5) Left lobe: 3.4 x 1.5 x 1.4 cm (previously 3.5 x 1.4 x 1.3) _________________________________________________________ Estimated total number of nodules >/= 1 cm: 0 Number of spongiform nodules >/=  2 cm not described below (TR1): 0 Number of mixed cystic and solid nodules >/= 1.5 cm not described below (TR2): 0 Nodule # 1: Location: Left; Mid Maximum size: 0.9 cm cm; Other 2 dimensions: 0.9 x 0.8, previously 1.2 x 0.8 x1 cm Composition: solid/almost completely solid (2) Echogenicity: hypoechoic (2) Shape: not taller-than-wide (0)  Margins: ill-defined (0) Echogenic foci: macrocalcifications (1) ACR TI-RADS total points: 5. ACR TI-RADS risk category: TR4 (4-6 points). ACR TI-RADS recommendations: Given size (<0.9 cm) and appearance, this nodule does NOT meet TI-RADS criteria for biopsy or dedicated follow-up. IMPRESSION: 1. Interval decrease in size of solitary left nodule, which does not meet criteria for biopsy or dedicated imaging follow-up. The above is in keeping with the ACR TI-RADS recommendations - J Am Coll Radiol 2017;14:587-595. Electronically Signed   By: Lucrezia Europe M.D.   On: 02/19/2016 12:47    Assessment & Plan:   There are no diagnoses linked to this encounter. I have discontinued Ms. Gulas's BYSTOLIC. I am also having her maintain her [place] KIT, glucose blood, ONE TOUCH LANCETS, dorzolamide-timolol, vitamin B-12, Omega-3, loratadine, Probiotic Product (PROBIOTIC DAILY PO), losartan, potassium bicarbonate, fluticasone, amLODipine, acyclovir, apixaban, and  flecainide.  No orders of the defined types were placed in this encounter.    Follow-up: No Follow-up on file.  Walker Kehr, MD

## 2016-03-05 ENCOUNTER — Telehealth: Payer: Self-pay | Admitting: Cardiology

## 2016-03-05 NOTE — Telephone Encounter (Signed)
Called patient and went over instructions

## 2016-03-05 NOTE — Telephone Encounter (Deleted)
error 

## 2016-03-05 NOTE — Telephone Encounter (Signed)
New Message   Pt call to get instructions for test tomorrow. Please call back to discuss

## 2016-03-06 ENCOUNTER — Telehealth: Payer: Self-pay

## 2016-03-06 ENCOUNTER — Ambulatory Visit: Payer: BLUE CROSS/BLUE SHIELD

## 2016-03-06 ENCOUNTER — Other Ambulatory Visit: Payer: Self-pay | Admitting: Cardiology

## 2016-03-06 MED ORDER — CARVEDILOL 12.5 MG PO TABS: 12.5000 mg | ORAL_TABLET | Freq: Two times a day (BID) | ORAL | 3 refills | Status: DC

## 2016-03-06 MED ORDER — AMLODIPINE BESYLATE 10 MG PO TABS: 10.0000 mg | ORAL_TABLET | Freq: Every day | ORAL | 3 refills | Status: DC

## 2016-03-06 NOTE — Telephone Encounter (Signed)
Dr. Curt Bears made medication adjustments due to hypertension during GXT today. Dr. Curt Bears Rx Carvedilol 12.5 BID and increased Amlodipine to 10 mg once daily. Pt requested she would rather start back on Bystolic. Informed Dr. Curt Bears prefers pt to try Carvedilol and Amlodipine instead of Bystolic (it will do a better job with regulating BP). Informed to keep f/u appt on 03/12/16 with Dr. Baird Kay. Reviewed medications and instructions with pt. Informed to call our office with questions or concerns taking the medications. Pt verbalized understanding.

## 2016-03-07 ENCOUNTER — Ambulatory Visit: Payer: BLUE CROSS/BLUE SHIELD | Admitting: Pulmonary Disease

## 2016-03-11 ENCOUNTER — Telehealth: Payer: Self-pay | Admitting: Cardiology

## 2016-03-11 NOTE — Telephone Encounter (Signed)
New Message     Pt c/o medication issue:  1. Name of Medication: coreg  2. How are you currently taking this medication (dosage and times per day)? 2x day  3. Are you having a reaction (difficulty breathing--STAT)? no  4. What is your medication issue? Tired and muscle weakness

## 2016-03-11 NOTE — Telephone Encounter (Signed)
I spoke with the pt and today she has experienced muscle weakness and fatigue.  The pt said her feet and legs felt weak and heavy and even when eating a sandwich she felt like her tongue was weak. The pt feels this is related to Carvedilol.  The pt is scheduled to see Dr Curt Bears in the morning and I advised her that she can hold her PM and AM dosage of Carvedilol or she may try cutting the dose in half.  The pt does check her vitals and today 118/78, 80 and yesterday 105/72, 75.  I will forward this information to Dr Curt Bears for the pt's appt tomorrow.

## 2016-03-12 ENCOUNTER — Ambulatory Visit (INDEPENDENT_AMBULATORY_CARE_PROVIDER_SITE_OTHER): Payer: BLUE CROSS/BLUE SHIELD | Admitting: Cardiology

## 2016-03-12 ENCOUNTER — Encounter: Payer: Self-pay | Admitting: Cardiology

## 2016-03-12 ENCOUNTER — Ambulatory Visit: Payer: BLUE CROSS/BLUE SHIELD | Admitting: Cardiology

## 2016-03-12 VITALS — BP 102/78 | HR 70 | Ht 60.0 in | Wt 121.6 lb

## 2016-03-12 DIAGNOSIS — I484 Atypical atrial flutter: Secondary | ICD-10-CM | POA: Diagnosis not present

## 2016-03-12 MED ORDER — DILTIAZEM HCL ER COATED BEADS 180 MG PO CP24: 180.0000 mg | ORAL_CAPSULE | Freq: Every day | ORAL | 3 refills | Status: DC

## 2016-03-12 NOTE — Patient Instructions (Addendum)
Medication Instructions:   Your physician has recommended you make the following change in your medication:  1) STOP Carvedilol (Coreg) 2) START Diltiazem 180 mg once daily  --- If you need a refill on your cardiac medications before your next appointment, please call your pharmacy. ---  Labwork:  None ordered  Testing/Procedures:  None ordered  Follow-Up:  Your physician recommends that you schedule a follow-up appointment in: 3 months with Dr. Curt Bears.  Thank you for choosing [facility]!!   Trinidad Curet, RN [phone]    Any Other Special Instructions Will Be Listed Below (If Applicable).   Please call the office in several weeks to let us know how you are feeling.   Diltiazem tablets What is this medicine? DILTIAZEM (dil TYE a zem) is a calcium-channel blocker. It affects the amount of calcium found in your heart and muscle cells. This relaxes your blood vessels, which can reduce the amount of work the heart has to do. This medicine is used to treat chest pain caused by angina. This medicine may be used for other purposes; ask your health care provider or pharmacist if you have questions. COMMON BRAND NAME(S): Cardizem What should I tell my health care provider before I take this medicine? They need to know if you have any of these conditions: -heart problems, low blood pressure, irregular heartbeat -liver disease -previous heart attack -an unusual or allergic reaction to diltiazem, other medicines, foods, dyes, or preservatives -pregnant or trying to get pregnant -breast-feeding How should I use this medicine? Take this medicine by mouth with a glass of water. Follow the directions on the prescription label. Do not cut, crush or chew this medicine. This medicine is usually taken before meals and at bedtime. Take your doses at regular intervals. Do not take your medicine more often then directed. Do not stop taking except on the advice of your doctor or  health care professional. Talk to your pediatrician regarding the use of this medicine in children. Special care may be needed. Overdosage: If you think you have taken too much of this medicine contact a poison control center or emergency room at once. NOTE: This medicine is only for you. Do not share this medicine with others. What if I miss a dose? If you miss a dose, take it as soon as you can. If it is almost time for your next dose, take only that dose. Do not take double or extra doses. What may interact with this medicine? Do not take this medicine with any of the following: -cisapride -hawthorn -pimozide -ranolazine -red yeast rice This medicine may also interact with the following medications: -buspirone -carbamazepine -cimetidine -cyclosporine -digoxin -local anesthetics or general anesthetics -lovastatin -medicines for anxiety or difficulty sleeping like midazolam and triazolam -medicines for high blood pressure or heart problems -quinidine -rifampin, rifabutin, or rifapentine This list may not describe all possible interactions. Give your health care provider a list of all the medicines, herbs, non-prescription drugs, or dietary supplements you use. Also tell them if you smoke, drink alcohol, or use illegal drugs. Some items may interact with your medicine. What should I watch for while using this medicine? Check your blood pressure and pulse rate regularly. Ask your doctor or health care professional what your blood pressure and pulse rate should be and when you should contact him or her. You may feel dizzy or lightheaded. Do not drive, use machinery, or do anything that needs mental alertness until you know how this medicine affects you. To  reduce the risk of dizzy or fainting spells, do not sit or stand up quickly, especially if you are an older patient. Alcohol can make you more dizzy or increase flushing and rapid heartbeats. Avoid alcoholic drinks. What side effects may  I notice from receiving this medicine? Side effects that you should report to your doctor or health care professional as soon as possible: -allergic reactions like skin rash, itching or hives, swelling of the face, lips, or tongue -confusion, mental depression -feeling faint or lightheaded, falls -pinpoint red spots on the skin -redness, blistering, peeling or loosening of the skin, including inside the mouth -slow, irregular heartbeat -swelling of the ankles, feet -unusual bleeding or bruising Side effects that usually do not require medical attention (report to your doctor or health care professional if they continue or are bothersome): -change in sex drive or performance -constipation or diarrhea -flushing of the face -headache -nausea, vomiting -tired or weak -trouble sleeping This list may not describe all possible side effects. Call your doctor for medical advice about side effects. You may report side effects to FDA at 1-800-FDA-1088. Where should I keep my medicine? Keep out of the reach of children. Store at room temperature between 20 and 25 degrees C (68 and 77 degrees F). Protect from light. Keep container tightly closed. Throw away any unused medicine after the expiration date. NOTE: This sheet is a summary. It may not cover all possible information. If you have questions about this medicine, talk to your doctor, pharmacist, or health care provider.  2018 Elsevier/Gold Standard (2012-12-14 10:54:31)

## 2016-03-12 NOTE — Progress Notes (Signed)
Electrophysiology Office Note   Date:  03/12/2016   ID:  LAURIEANNE GALLOWAY, DOB 1960/03/17, MRN [id number]  PCP:  Walker Kehr, MD  Cardiologist:  Stanford Breed Primary Electrophysiologist:  Dmitri Pettigrew Meredith Leeds, MD    Chief Complaint  Patient presents with  . Follow-up    ATypical Aflutter     History of Present Illness: [name] is a 56 y.o. female who presents today for electrophysiology evaluation.   She has a history of atrial flutter, depression, hypertension has had intermittent dyspnea on exertion since 2016. She does have occasional palpitations.She says that she also has low blood pressures at times and feels weak and fatigued. She started her Eliquis on January 10. She was started on carvedilol when she presented to her stress test significant hypertension. Since being started on that, she has noted improved blood pressures, but has had significant amounts of fatigue. She feels like the start of the carvedilol is what made her initially fatigue.   Today, she denies symptoms of chest pain, shortness of breath, orthopnea, PND, lower extremity edema, claudication, dizziness, presyncope, syncope, bleeding, or neurologic sequela. The patient is tolerating medications without difficulties and is otherwise without complaint today.    Past Medical History:  Diagnosis Date  . Allergy   . Anxiety   . Asthma   . Atrial flutter ([place])   . Cataract   . Depression    pt states no depression 07-11-15  . Glaucoma   . Heart murmur    as child only  . Herpes simplex   . Hypertension   . Multiple thyroid nodules   . Osteoarthritis   . Osteoporosis   . PUD (peptic ulcer disease)   . Pulmonary sarcoidosis ([place])   . Vitamin D deficiency    Past Surgical History:  Procedure Laterality Date  . BUNIONECTOMY     x2  . CATARACT EXTRACTION Right   . DENTAL SURGERY    . DILATION AND CURETTAGE OF UTERUS    . fibroid tumor removal    . GLAUCOMA SURGERY     Right eye  . MYOMECTOMY        Current Outpatient Prescriptions  Medication Sig Dispense Refill  . acyclovir (ZOVIRAX) 200 MG capsule Take 200 mg by mouth 2 (two) times daily.    [name] amLODipine (NORVASC) 10 MG tablet Take 1 tablet (10 mg total) by mouth daily. 30 tablet 3  . apixaban (ELIQUIS) 5 MG TABS tablet Take 1 tablet (5 mg total) by mouth 2 (two) times daily. 60 tablet 6  . Blood Glucose Monitoring Suppl (ONE TOUCH ULTRA SYSTEM KIT) W/DEVICE KIT 1 kit by Does not apply route as directed. 1 each 0  . dorzolamide-timolol (COSOPT) 22.3-6.8 MG/ML ophthalmic solution Place 1 drop into the left eye daily.   12  . flecainide (TAMBOCOR) 50 MG tablet Take 1.5 tablets (75 mg total) by mouth 2 (two) times daily. 45 tablet 6  . fluticasone (FLONASE) 50 MCG/ACT nasal spray USE 2 SPRAYS IN EACH NOSTRIL ONCE DAILY (Patient taking differently: USE 2 SPRAYS IN EACH NOSTRIL as needed) 16 g 6  . glucose blood test strip 1 each by Other route as directed. 100 each 3  . loratadine (CLARITIN) 10 MG tablet Take 10 mg by mouth daily.    [name] losartan (COZAAR) 100 MG tablet TAKE 1 TABLET BY MOUTH ONCE DAILY 90 tablet 3  . Omega-3 300 MG CAPS Take 600 mg by mouth daily.    . ONE TOUCH LANCETS  MISC 1 each by Does not apply route as directed. 100 each 3  . potassium bicarbonate (KLOR-CON/EF) 25 MEQ disintegrating tablet DISSOLVE ONE TABLET IN THE MOUTH TWICE A DAY 60 tablet 11  . Probiotic Product (PROBIOTIC DAILY PO) Take 1 each by mouth daily.    . vitamin B-12 (CYANOCOBALAMIN) 100 MCG tablet Take 100 mcg by mouth daily. Reported on 06/20/2015    . diltiazem (CARDIZEM CD) 180 MG 24 hr capsule Take 1 capsule (180 mg total) by mouth daily. 90 capsule 3   No current facility-administered medications for this visit.     Allergies:   Amoxicillin-pot clavulanate; Cardizem [diltiazem hcl]; Carvedilol; Cefdinir; Cephalexin; Hydrochlorothiazide; Influenza vaccines; Prednisone; Sulfamethoxazole; Sulfonamide derivatives; and Verapamil   Social  History:  The patient  reports that she has never smoked. She has never used smokeless tobacco. She reports that she does not drink alcohol or use drugs.   Family History:  The patient's family history includes Heart Problems in her father; Hypertension in her father; Lung cancer in her mother; Stomach cancer in her cousin; Stroke in her father.    ROS:  Please see the history of present illness.   Otherwise, review of systems is positive for weight loss, fatigue, cough, dizziness.   All other systems are reviewed and negative.    PHYSICAL EXAM: VS:  BP 102/78   Pulse 70   Ht 5' (1.524 m)   Wt 121 lb 9.6 oz (55.2 kg)   BMI 23.75 kg/m  , BMI Body mass index is 23.75 kg/m. GEN: Well nourished, well developed, in no acute distress  HEENT: normal  Neck: no JVD, carotid bruits, or masses Cardiac: RRR; no murmurs, rubs, or gallops,no edema  Respiratory:  clear to auscultation bilaterally, normal work of breathing GI: soft, nontender, nondistended, + BS MS: no deformity or atrophy  Skin: warm and dry Neuro:  Strength and sensation are intact Psych: euthymic mood, full affect  EKG:  EKG is ordered today. Personal review of the ekg ordered shows sinus rhythm, right axis deviation, first degree AV block  Recent Labs: 10/24/2015: ALT 20; BUN 19; Creatinine, Ser 1.47; Potassium 4.0; Sodium 138; TSH 3.42 02/13/2016: Platelets 350    Lipid Panel     Component Value Date/Time   CHOL 150 01/25/2013 0840   TRIG 55.0 01/25/2013 0840   TRIG 86 01/02/2006 0927   HDL 38.90 (L) 01/25/2013 0840   CHOLHDL 4 01/25/2013 0840   VLDL 11.0 01/25/2013 0840   LDLCALC 100 (H) 01/25/2013 0840     Wt Readings from Last 3 Encounters:  03/12/16 121 lb 9.6 oz (55.2 kg)  02/29/16 121 lb 12 oz (55.2 kg)  02/13/16 118 lb (53.5 kg)      Other studies Reviewed: Additional studies/ records that were reviewed today include: TTE 01/24/16  Review of the above records today demonstrates:  - Left ventricle:  The cavity size was normal. Wall thickness was   normal. Systolic function was normal. The estimated ejection   fraction was in the range of 55% to 60%. Wall motion was normal;   there were no regional wall motion abnormalities. - Mitral valve: There was mild to moderate regurgitation.   ASSESSMENT AND PLAN:  1.  Atypical atrial flutter: It appears that she has been in atrial flutter since 2016. She is currently anticoagulated with Coumadin. Unfortunately, it appears that her heart atrial flutter is atypical and possibly left-sided with positive F waves in the inferior leads and V1 with negative F waves  in aVL. She had a cardioversion and is now in sinus rhythm with a long first-degree AV block. She is feeling fatigued with her carvedilol. We'll stop carvedilol and switch to diltiazem. We'll continue her Eliquis and her flecainide.  This patients CHA2DS2-VASc Score and unadjusted Ischemic Stroke Rate (% per year) is equal to 2.2 % stroke rate/year from a score of 2  Above score calculated as 1 point each if present [CHF, HTN, DM, Vascular=MI/PAD/Aortic Plaque, Age if 65-74, or Female] Above score calculated as 2 points each if present [Age > 75, or Stroke/TIA/TE]  2. Hypertension: His low today and she is having episodes of fatigue. We'll stop carvedilol and start diltiazem.  Current medicines are reviewed at length with the patient today.   The patient does not have concerns regarding her medicines.  The following changes were made today:  Stop carvedilol, start diltiazem   Labs/ tests ordered today include:  Orders Placed This Encounter  Procedures  . EKG 12-Lead     Disposition:   FU with Jezebel Pollet 3 month  Signed, Mehran Guderian Meredith Leeds, MD  03/12/2016 8:53 AM     [facility] [address] [place] [address] [phone] (office) [phone] (fax)

## 2016-03-20 NOTE — Progress Notes (Signed)
HPI: FU atrial flutter. Reviewing her electrocardiograms she has been in either atrial flutter or atrial tachycardia since 2016. Echocardiogram January 2018 showed normal LV function and mild to moderate mitral regurgitation. Pt seen by Dr Curt Bears and flutter felt atypical; ablation not recommended. Pt started on flecanide and scheduled for DCCV but was in sinus at presentation for procedure. Exercise treadmill March 2018 showed no chest pain, no ST changes and no exercise-induced ventricular tachycardia. Since last seen, patient denies dyspnea, chest pain, palpitations or syncope. Occasional lightheadedness with standing.  Current Outpatient Prescriptions  Medication Sig Dispense Refill  . acyclovir (ZOVIRAX) 200 MG capsule Take 200 mg by mouth 2 (two) times daily.    [name] amLODipine (NORVASC) 10 MG tablet Take 1 tablet (10 mg total) by mouth daily. 30 tablet 3  . apixaban (ELIQUIS) 5 MG TABS tablet Take 1 tablet (5 mg total) by mouth 2 (two) times daily. 60 tablet 6  . Blood Glucose Monitoring Suppl (ONE TOUCH ULTRA SYSTEM KIT) W/DEVICE KIT 1 kit by Does not apply route as directed. 1 each 0  . dorzolamide-timolol (COSOPT) 22.3-6.8 MG/ML ophthalmic solution Place 1 drop into the left eye daily.   12  . flecainide (TAMBOCOR) 50 MG tablet Take 1.5 tablets (75 mg total) by mouth 2 (two) times daily. 45 tablet 6  . fluticasone (FLONASE) 50 MCG/ACT nasal spray USE 2 SPRAYS IN EACH NOSTRIL ONCE DAILY (Patient taking differently: USE 2 SPRAYS IN EACH NOSTRIL as needed) 16 g 6  . glucose blood test strip 1 each by Other route as directed. 100 each 3  . loratadine (CLARITIN) 10 MG tablet Take 10 mg by mouth daily.    [name] losartan (COZAAR) 100 MG tablet TAKE 1 TABLET BY MOUTH ONCE DAILY 90 tablet 3  . Omega-3 300 MG CAPS Take 600 mg by mouth daily.    . ONE TOUCH LANCETS MISC 1 each by Does not apply route as directed. 100 each 3  . OVER THE COUNTER MEDICATION 1 cup of beet juice every night    .  potassium bicarbonate (KLOR-CON/EF) 25 MEQ disintegrating tablet DISSOLVE ONE TABLET IN THE MOUTH TWICE A DAY 60 tablet 11  . Probiotic Product (PROBIOTIC DAILY PO) Take 1 each by mouth daily.    . vitamin B-12 (CYANOCOBALAMIN) 100 MCG tablet Take 100 mcg by mouth daily. Reported on 06/20/2015     No current facility-administered medications for this visit.      Past Medical History:  Diagnosis Date  . Allergy   . Anxiety   . Asthma   . Atrial flutter ([place])   . Cataract   . Depression    pt states no depression 07-11-15  . Glaucoma   . Heart murmur    as child only  . Herpes simplex   . Hypertension   . Multiple thyroid nodules   . Osteoarthritis   . Osteoporosis   . PUD (peptic ulcer disease)   . Pulmonary sarcoidosis ([place])   . Vitamin D deficiency     Past Surgical History:  Procedure Laterality Date  . BUNIONECTOMY     x2  . CATARACT EXTRACTION Right   . DENTAL SURGERY    . DILATION AND CURETTAGE OF UTERUS    . fibroid tumor removal    . GLAUCOMA SURGERY     Right eye  . MYOMECTOMY      Social History   Social History  . Marital status: Divorced    Spouse name:  N/A  . Number of children: N/A  . Years of education: N/A   Occupational History  . Not on file.   Social History Main Topics  . Smoking status: Never Smoker  . Smokeless tobacco: Never Used  . Alcohol use No  . Drug use: No  . Sexual activity: No   Other Topics Concern  . Not on file   Social History Narrative   Single/divorced; no children; she has a friend now   Never smoked   Positive history of passive tobacco smoke exposure.   Exercise- up to 4 times a week   Caffeine- 1-2 per week    Family History  Problem Relation Age of Onset  . Lung cancer Mother     sarcoid  . Stroke Father   . Hypertension Father   . Heart Problems Father   . Stomach cancer Cousin     3rd cousin   . Colon cancer Neg Hx   . Colon polyps Neg Hx   . Esophageal cancer Neg Hx   . Rectal cancer Neg Hx       ROS: no fevers or chills, productive cough, hemoptysis, dysphasia, odynophagia, melena, hematochezia, dysuria, hematuria, rash, seizure activity, orthopnea, PND, pedal edema, claudication. Remaining systems are negative.  Physical Exam: Well-developed well-nourished in no acute distress.  Skin is warm and dry.  HEENT is normal.  Neck is supple.  Chest is clear to auscultation with normal expansion.  Cardiovascular exam is regular rate and rhythm. No murmur Abdominal exam nontender or distended. No masses palpated. Extremities show no edema. neuro grossly intact  A/P  1 atrial flutter-patient remains in sinus rhythm on examination. Continue flecainide. Continue apixaban. She is felt not to be a candidate for atrial fibrillation ablation.   2 hypertension-blood pressure borderline and some dizziness with standing; decrease amlodipine to 5 mg daily and follow.   3 Asthma-per primary care.  Kirk Ruths, MD

## 2016-03-21 ENCOUNTER — Ambulatory Visit (INDEPENDENT_AMBULATORY_CARE_PROVIDER_SITE_OTHER): Payer: BLUE CROSS/BLUE SHIELD

## 2016-03-21 ENCOUNTER — Other Ambulatory Visit: Payer: BLUE CROSS/BLUE SHIELD

## 2016-03-21 DIAGNOSIS — I484 Atypical atrial flutter: Secondary | ICD-10-CM | POA: Diagnosis not present

## 2016-03-21 DIAGNOSIS — Z79899 Other long term (current) drug therapy: Secondary | ICD-10-CM | POA: Diagnosis not present

## 2016-03-21 LAB — EXERCISE TOLERANCE TEST
CHL RATE OF PERCEIVED EXERTION: 15
Estimated workload: 6.6 METS
Exercise duration (min): 4 min
Exercise duration (sec): 43 s
MPHR: 164 {beats}/min
Peak HR: 106 {beats}/min
Percent HR: 64 %
Rest HR: 60 {beats}/min

## 2016-03-27 ENCOUNTER — Ambulatory Visit (INDEPENDENT_AMBULATORY_CARE_PROVIDER_SITE_OTHER): Payer: BLUE CROSS/BLUE SHIELD | Admitting: Cardiology

## 2016-03-27 ENCOUNTER — Encounter: Payer: Self-pay | Admitting: Cardiology

## 2016-03-27 VITALS — BP 123/83 | HR 50 | Ht 60.0 in | Wt 116.8 lb

## 2016-03-27 DIAGNOSIS — I484 Atypical atrial flutter: Secondary | ICD-10-CM | POA: Diagnosis not present

## 2016-03-27 DIAGNOSIS — I10 Essential (primary) hypertension: Secondary | ICD-10-CM

## 2016-03-27 MED ORDER — AMLODIPINE BESYLATE 5 MG PO TABS: 5.0000 mg | ORAL_TABLET | Freq: Every day | ORAL | 3 refills | Status: DC

## 2016-03-27 NOTE — Patient Instructions (Signed)
Medication Instructions:   DECREASE AMLODIPINE TO 5 MG ONCE DAILY= 1/2 OF THE 10 MG TABLET ONCE DAILY  Follow-Up:  Your physician wants you to follow-up in: 6 MONTHS WITH DR CRENSHAW You will receive a reminder letter in the mail two months in advance. If you don't receive a letter, please call our office to schedule the follow-up appointment.   If you need a refill on your cardiac medications before your next appointment, please call your pharmacy.    

## 2016-03-28 ENCOUNTER — Ambulatory Visit
Admission: RE | Admit: 2016-03-28 | Discharge: 2016-03-28 | Disposition: A | Discharge status: Home / Self Care | Payer: BLUE CROSS/BLUE SHIELD | Source: Ambulatory Visit | Attending: Internal Medicine | Admitting: Internal Medicine

## 2016-04-01 ENCOUNTER — Other Ambulatory Visit: Payer: Self-pay | Admitting: Internal Medicine

## 2016-04-01 DIAGNOSIS — R945 Abnormal results of liver function studies: Secondary | ICD-10-CM

## 2016-04-01 DIAGNOSIS — R7989 Other specified abnormal findings of blood chemistry: Secondary | ICD-10-CM

## 2016-04-03 ENCOUNTER — Telehealth: Payer: Self-pay | Admitting: Internal Medicine

## 2016-04-03 NOTE — Telephone Encounter (Signed)
Please follow up with patient to review all findings of abdominal ultrasound.

## 2016-04-03 NOTE — Telephone Encounter (Signed)
LVM for pt to call back as soon as possible.   RE: US findings were inconclusive and a MRI has been ordered.

## 2016-04-03 NOTE — Progress Notes (Signed)
Cecille Rubin requested order to be changed to MR abdomen w wo contrast.

## 2016-04-09 ENCOUNTER — Ambulatory Visit: Payer: BLUE CROSS/BLUE SHIELD | Admitting: Pulmonary Disease

## 2016-04-10 ENCOUNTER — Ambulatory Visit: Payer: BLUE CROSS/BLUE SHIELD | Admitting: Pulmonary Disease

## 2016-04-11 ENCOUNTER — Telehealth: Payer: Self-pay | Admitting: Internal Medicine

## 2016-04-11 NOTE — Telephone Encounter (Signed)
Routing to dr plotnikov, please advise, thanks 

## 2016-04-11 NOTE — Telephone Encounter (Signed)
Pt called wanting to know if she can be prescribed anything for slowing down her metabolism she is loosing weight and is down to 114.6. Has a good appetite.

## 2016-04-13 NOTE — Telephone Encounter (Signed)
Pls go to lab for TSH, FT4, CBC, CMET, UA Is abd MRI scheduled? ROV Thx

## 2016-04-15 ENCOUNTER — Telehealth: Payer: Self-pay

## 2016-04-15 ENCOUNTER — Telehealth: Payer: Self-pay | Admitting: Internal Medicine

## 2016-04-15 DIAGNOSIS — R634 Abnormal weight loss: Secondary | ICD-10-CM

## 2016-04-15 NOTE — Telephone Encounter (Signed)
error 

## 2016-04-15 NOTE — Telephone Encounter (Signed)
Noted MRI was advised by radiology after reading abd [place] Thx

## 2016-04-15 NOTE — Telephone Encounter (Signed)
Advised patient of dr plotnikovs note---I have ordered labs, patient has scheduled OV for 5/3---she will have labs done then---patient did completed US of abd, but states she can't afford MRI of abdomen right now, her medical bills are too high---routing back to dr plotnikov, fyi.Marland KitchenMarland Kitchen

## 2016-04-15 NOTE — Telephone Encounter (Signed)
Pt declined referral for now due to trying to pay off previous medical bills. Will call back to schedule when she is ready

## 2016-04-17 NOTE — Telephone Encounter (Signed)
Noted. Thx.

## 2016-04-27 ENCOUNTER — Ambulatory Visit (INDEPENDENT_AMBULATORY_CARE_PROVIDER_SITE_OTHER): Payer: BLUE CROSS/BLUE SHIELD | Admitting: Family Medicine

## 2016-04-27 ENCOUNTER — Encounter: Payer: Self-pay | Admitting: Family Medicine

## 2016-04-27 ENCOUNTER — Other Ambulatory Visit (HOSPITAL_COMMUNITY)
Admission: RE | Admit: 2016-04-27 | Discharge: 2016-04-27 | Disposition: A | Discharge status: Home / Self Care | Payer: BLUE CROSS/BLUE SHIELD | Source: Other Acute Inpatient Hospital | Attending: Family Medicine | Admitting: Family Medicine

## 2016-04-27 VITALS — BP 136/96 | HR 79 | Temp 98.1°F | Wt 117.1 lb

## 2016-04-27 DIAGNOSIS — R300 Dysuria: Secondary | ICD-10-CM

## 2016-04-27 DIAGNOSIS — M545 Low back pain, unspecified: Secondary | ICD-10-CM

## 2016-04-27 LAB — POCT URINALYSIS DIPSTICK
BILIRUBIN UA: NEGATIVE
Glucose, UA: NEGATIVE
Ketones, UA: NEGATIVE
NITRITE UA: NEGATIVE
PH UA: 7.5 (ref 5.0–8.0)
RBC UA: NEGATIVE
Spec Grav, UA: 1.01 (ref 1.010–1.025)
UROBILINOGEN UA: 0.2 U/dL

## 2016-04-27 MED ORDER — CYCLOBENZAPRINE HCL 10 MG PO TABS: 10.0000 mg | ORAL_TABLET | Freq: Three times a day (TID) | ORAL | 0 refills | Status: DC | PRN

## 2016-04-27 NOTE — Patient Instructions (Signed)
Follow up w/ Dr Alain Marion next week if no improvement or worsening Start the Flexeril (cyclobenzaprine) as needed for pain/spasm- will cause drowsiness Use tylenol as needed for pain relief Use a heating pad for pain relief Do some gentle stretching to avoid stiffness Call with any questions or concerns Hang in there!!!

## 2016-04-27 NOTE — Progress Notes (Signed)
Pre visit review using our clinic review tool, if applicable. No additional management support is needed unless otherwise documented below in the visit note. 

## 2016-04-27 NOTE — Progress Notes (Signed)
   Subjective:    Patient ID: [name], female    DOB: Aug 06, 1960, 56 y.o.   MRN: [id number]  HPI LBP- R sided, started yesterday.  Reports it's 'not a muscle or bone ache'.  Denies injury.  'a nagging discomfort'.  Took tylenol last night w/ some relief.  Denies dysuria, no blood in urine.  Last BM yesterday.  No changes in sleeping arrangements.  Pt did weight lifting on Tuesday- this was first time since last year- but denies problems on Tuesday/Wed/Thurs.  Pain is paraspinal and radiating around R flank.   Review of Systems For ROS see HPI     Objective:   Physical Exam  Constitutional: She is oriented to person, place, and time. She appears well-developed and well-nourished. No distress.  Abdominal: Soft. Bowel sounds are normal. She exhibits no distension and no mass. There is no tenderness. There is no rebound and no guarding.  Musculoskeletal: She exhibits tenderness (TTP over R lumbar paraspinal muscle.  no TTP over spine.  no CVA tenderness). She exhibits no edema.  Neurological: She is alert and oriented to person, place, and time.  Skin: Skin is warm and dry.  Psychiatric: She has a normal mood and affect. Her behavior is normal. Thought content normal.  Vitals reviewed.         Assessment & Plan:  R low back pain- new.  Started yesterday.  Pt did resume weight lifting after a 1 yr hiatus on Tuesday.  She denies injury but she may have strained or aggravated something.  UA is fairly unremarkable and she denies urinary symptoms but I will send for cx given the presence of leuks.  Start Flexeril for spasm, tylenol for pain, and heating pad for comfort.  If no improvement, will need to f/u w/ PCP as she has other health issues going on (unexplained weight loss).  She is on Eliquis so no NSAIDs at this time.  No evidence of a retroperitoneal hematoma but this must remain on the differential.  Reviewed supportive care and red flags that should prompt return.  Pt expressed  understanding and is in agreement w/ plan.

## 2016-04-28 LAB — URINE CULTURE: Culture: NO GROWTH

## 2016-04-29 ENCOUNTER — Telehealth: Payer: Self-pay | Admitting: Internal Medicine

## 2016-04-29 NOTE — Telephone Encounter (Signed)
Pt called stating she is worried about her lab results from her last urine culture, she had elevated white blood cells in urine.  Pt has a MRI schedule with [facility] 4/30 for liver

## 2016-04-29 NOTE — Telephone Encounter (Signed)
LMTCB

## 2016-04-30 ENCOUNTER — Other Ambulatory Visit: Payer: BLUE CROSS/BLUE SHIELD

## 2016-04-30 NOTE — Telephone Encounter (Signed)
Pt notified urine culture came back negative.

## 2016-05-03 ENCOUNTER — Other Ambulatory Visit: Payer: Self-pay | Admitting: Internal Medicine

## 2016-05-06 ENCOUNTER — Other Ambulatory Visit: Payer: Self-pay | Admitting: Internal Medicine

## 2016-05-09 ENCOUNTER — Other Ambulatory Visit (INDEPENDENT_AMBULATORY_CARE_PROVIDER_SITE_OTHER): Payer: BLUE CROSS/BLUE SHIELD

## 2016-05-09 DIAGNOSIS — R634 Abnormal weight loss: Secondary | ICD-10-CM | POA: Diagnosis not present

## 2016-05-09 LAB — URINALYSIS, ROUTINE W REFLEX MICROSCOPIC
Bilirubin Urine: NEGATIVE
Ketones, ur: NEGATIVE
Nitrite: NEGATIVE
PH: 7.5 (ref 5.0–8.0)
SPECIFIC GRAVITY, URINE: 1.01 (ref 1.000–1.030)
URINE GLUCOSE: NEGATIVE
Urobilinogen, UA: 0.2 (ref 0.0–1.0)

## 2016-05-09 LAB — COMPREHENSIVE METABOLIC PANEL
ALBUMIN: 3.9 g/dL (ref 3.5–5.2)
ALK PHOS: 118 U/L — AB (ref 39–117)
ALT: 21 U/L (ref 0–35)
AST: 19 U/L (ref 0–37)
BUN: 21 mg/dL (ref 6–23)
CALCIUM: 11.6 mg/dL — AB (ref 8.4–10.5)
CHLORIDE: 102 meq/L (ref 96–112)
CO2: 31 mEq/L (ref 19–32)
CREATININE: 1.6 mg/dL — AB (ref 0.40–1.20)
GFR: 42.76 mL/min — ABNORMAL LOW (ref >60.00)
Glucose, Bld: 84 mg/dL (ref 70–99)
Potassium: 4.3 mEq/L (ref 3.5–5.1)
SODIUM: 139 meq/L (ref 135–145)
TOTAL PROTEIN: 8 g/dL (ref 6.0–8.3)
Total Bilirubin: 0.6 mg/dL (ref 0.2–1.2)

## 2016-05-09 LAB — CBC
HEMATOCRIT: 45.5 % (ref 36.0–46.0)
Hemoglobin: 15.3 g/dL — ABNORMAL HIGH (ref 12.0–15.0)
MCHC: 33.5 g/dL (ref 30.0–36.0)
MCV: 89.8 fl (ref 78.0–100.0)
PLATELETS: 351 10*3/uL (ref 150.0–400.0)
RBC: 5.07 Mil/uL (ref 3.87–5.11)
RDW: 15.6 % — ABNORMAL HIGH (ref 11.5–15.5)
WBC: 7.1 10*3/uL (ref 4.0–10.5)

## 2016-05-09 LAB — TSH: TSH: 2.73 u[IU]/mL (ref 0.35–4.50)

## 2016-05-09 LAB — T4, FREE: Free T4: 0.94 ng/dL (ref 0.60–1.60)

## 2016-05-13 ENCOUNTER — Other Ambulatory Visit: Payer: Self-pay | Admitting: Internal Medicine

## 2016-05-13 ENCOUNTER — Telehealth: Payer: Self-pay | Admitting: Gastroenterology

## 2016-05-13 ENCOUNTER — Telehealth: Payer: Self-pay | Admitting: Internal Medicine

## 2016-05-13 ENCOUNTER — Ambulatory Visit
Admission: RE | Admit: 2016-05-13 | Discharge: 2016-05-13 | Disposition: A | Discharge status: Home / Self Care | Payer: BLUE CROSS/BLUE SHIELD | Source: Ambulatory Visit | Attending: Internal Medicine | Admitting: Internal Medicine

## 2016-05-13 DIAGNOSIS — R160 Hepatomegaly, not elsewhere classified: Secondary | ICD-10-CM

## 2016-05-13 DIAGNOSIS — R945 Abnormal results of liver function studies: Secondary | ICD-10-CM

## 2016-05-13 DIAGNOSIS — R7989 Other specified abnormal findings of blood chemistry: Secondary | ICD-10-CM

## 2016-05-13 MED ORDER — GADOXETATE DISODIUM 0.25 MMOL/ML IV SOLN
5.0000 mL | Freq: Once | INTRAVENOUS | Status: AC | PRN
  Administered 2016-05-13: 5 mL via INTRAVENOUS

## 2016-05-14 NOTE — Telephone Encounter (Signed)
Patient has been scheduled for tomorrow with Dr. Fuller Plan at 3:00.  She is notified and aware

## 2016-05-15 ENCOUNTER — Other Ambulatory Visit (INDEPENDENT_AMBULATORY_CARE_PROVIDER_SITE_OTHER): Payer: BLUE CROSS/BLUE SHIELD

## 2016-05-15 ENCOUNTER — Telehealth: Payer: Self-pay

## 2016-05-15 ENCOUNTER — Encounter: Payer: Self-pay | Admitting: Gastroenterology

## 2016-05-15 ENCOUNTER — Ambulatory Visit (INDEPENDENT_AMBULATORY_CARE_PROVIDER_SITE_OTHER): Payer: BLUE CROSS/BLUE SHIELD | Admitting: Gastroenterology

## 2016-05-15 VITALS — BP 104/68 | HR 106 | Ht 60.0 in | Wt 116.6 lb

## 2016-05-15 DIAGNOSIS — R7989 Other specified abnormal findings of blood chemistry: Secondary | ICD-10-CM | POA: Diagnosis not present

## 2016-05-15 DIAGNOSIS — R634 Abnormal weight loss: Secondary | ICD-10-CM

## 2016-05-15 DIAGNOSIS — Z7901 Long term (current) use of anticoagulants: Secondary | ICD-10-CM

## 2016-05-15 DIAGNOSIS — R160 Hepatomegaly, not elsewhere classified: Secondary | ICD-10-CM

## 2016-05-15 DIAGNOSIS — R945 Abnormal results of liver function studies: Secondary | ICD-10-CM

## 2016-05-15 LAB — BASIC METABOLIC PANEL
BUN: 33 mg/dL — AB (ref 6–23)
CO2: 29 mEq/L (ref 19–32)
Calcium: 12 mg/dL — ABNORMAL HIGH (ref 8.4–10.5)
Chloride: 103 mEq/L (ref 96–112)
Creatinine, Ser: 1.89 mg/dL — ABNORMAL HIGH (ref 0.40–1.20)
GFR: 35.28 mL/min — AB (ref >60.00)
GLUCOSE: 94 mg/dL (ref 70–99)
POTASSIUM: 3.7 meq/L (ref 3.5–5.1)
Sodium: 138 mEq/L (ref 135–145)

## 2016-05-15 LAB — PROTIME-INR
INR: 1.6 ratio — ABNORMAL HIGH (ref 0.8–1.0)
Prothrombin Time: 17.3 s — ABNORMAL HIGH (ref 9.6–13.1)

## 2016-05-15 NOTE — Telephone Encounter (Signed)
Ok to hold apixaban 2 days prior to procedure and resume after when ok with GI Brian Crenshaw  

## 2016-05-15 NOTE — Patient Instructions (Addendum)
Your physician has requested that you go to the basement for lab work before leaving today.  Interventional Radiology will contact you to schedule your CT guided liver biopsy. We have sent a letter to Dr. Stanford Breed to ask him if you can come off your Eliquis prior to your biopsy. If you have not heard from our office prior to your biopsy regarding this, please call our office at [phone].   Thank you for choosing me and [facility].  Pricilla Riffle. Dagoberto Ligas., MD., Marval Regal

## 2016-05-15 NOTE — Telephone Encounter (Signed)
   [name] November 23, 1960 [id number]  Dear Dr. Stanford Breed:  We have scheduled the above named patient for a(n) CT guided liver biopsy. Our records show that (s)he is on anticoagulation therapy.  Please advise as to whether the patient may come off their therapy of Eliquis 2 days prior to their biopsy. We have not scheduled it yet.  Please route your response to Marlon Pel, CMA or fax response to [phone].  Sincerely,    [facility]

## 2016-05-15 NOTE — Progress Notes (Signed)
History of Present Illness: This is a 56 year old female referred for evaluation of a liver mass and elevated LFTs. Patient has noted a gradual 45 pound weight loss over the past year. Evaluation underway by Dr. Buddy Duty for hypercalcemia. Mildly elevated transaminases, less than 2 times normal, noted in January 2018. Alk phos mildly elevated in 04/2016 with normal transaminases. Abdominal ultrasound performed in March that led to MRI in April-see below. Patient notices slight increase in the frequency of bowel movements for the past few months but over the past few weeks her bowel pattern has returned to more typical 1-2 bowel movements per day. No other gastrointestinal complaints. Denies abdominal pain, constipation, diarrhea, change in stool caliber, melena, hematochezia, nausea, vomiting, dysphagia, reflux symptoms, chest pain.    Abd [place] 03/28/2016 IMPRESSION: Heterogeneous hepatic echotexture. Ill defined focus of slightly increased echotexture in the mid right lobe near the porta hepatis. No intrahepatic ductal dilation. Subtle surface contour irregularity. Given the elevated liver function studies and weight loss, hepatic protocol MRI would be useful to exclude cirrhosis and possible malignancy  Normal appearance of the gallbladder and common bile duct and spleen.  Limited visualization of the pancreas.  Mildly increased renal cortical echotexture suggests medical renal disease. A subcentimeter midpole cyst in the left kidney exhibits a mural calcification. Follow-up ultrasound in 6-12 months is recommended to re-evaluate this structure.  Abd MRI 05/13/2016 IMPRESSION: 1. Poorly defined round lesion within the RIGHT hepatic lobe measuring approximately 5 x 5 cm spanning segments 5 and 8. Lesion best depicted on the delayed Eovist imaging were lesion does not accumulate the hepatocytes specific contrast agent. This lesion demonstrates no significant enhancement. Differential would include  INFILTRATIVE MALIGNANCY, focal cirrhosis, or vascular phenomena. Recommend correlation with tumor markers. Consider percutaneous biopsy and at minimum recommend follow-up MRI or CT in 3 months. As Patient cannot hold breath well for MRI, CT may provide better quality imaging; however given the subtle findings lesion may be occult by CT.  2. Bosniak 1 and Bosniak 2 renal cysts.    Current Medications, Allergies, Past Medical History, Past Surgical History, Family History and Social History were reviewed in [employer] record.  Physical Exam: General: Well developed, well nourished, no acute distress Head: Normocephalic and atraumatic Eyes:  sclerae anicteric, EOMI Ears: Normal auditory acuity Mouth: No deformity or lesions Lungs: Clear throughout to auscultation Heart: Regular rate and rhythm; no murmurs, rubs or bruits Abdomen: Soft, non tender and non distended. No masses, hepatosplenomegaly or hernias noted. Normal Bowel sounds Musculoskeletal: Symmetrical with no gross deformities  Pulses:  Normal pulses noted Extremities: No clubbing, cyanosis, edema or deformities noted Neurological: Alert oriented x 4, grossly nonfocal Psychological:  Alert and cooperative. Normal mood and affect  Assessment and Recommendations:  1. Right hepatic lobe mass measuring 5 x 5 cm, weight loss. R/O infiltrative mass. No history of liver disease and no clear features of cirrhosis. Check AFP, PT/INR, BMET today. CT imaging with CY guided liver mass biopsy off Eliquis.   2. History of sarcoidosis.  3. Hypercalcemia. R/O paraneoplastic. BMET today.   4. Afib on Eliquis. Plan to hold Eliquis 2 days before procedure however IR may want to handle differently - IR will instruct when and how to resume after procedure. Low but real risk of cardiovascular event such as heart attack, stroke, embolism, thrombosis or ischemia/infarct of other organs off Eliquis explained and need to seek  urgent help if this occurs. The patient consents to  proceed. Will communicate by phone or EMR with patient's prescribing provider to confirm that holding Eliquis is reasonable in this case. IR may do the same as well.

## 2016-05-16 ENCOUNTER — Telehealth: Payer: Self-pay | Admitting: Internal Medicine

## 2016-05-16 ENCOUNTER — Ambulatory Visit: Payer: BLUE CROSS/BLUE SHIELD | Admitting: Internal Medicine

## 2016-05-16 LAB — AFP TUMOR MARKER: AFP-Tumor Marker: 2.7 ng/mL (ref <6.1)

## 2016-05-16 NOTE — Addendum Note (Signed)
Addended by: Marzella Schlein on: 05/16/2016 08:16 AM   Modules accepted: Orders

## 2016-05-16 NOTE — Telephone Encounter (Signed)
Patient informed to hold Eliquis 2 days prior to her liver biopsy per Dr. Stanford Breed. Informed patient that they will contact her with an appt date and time for the liver biopsy but make a note for this medication hold. Patient verbalized understanding.

## 2016-05-16 NOTE — Telephone Encounter (Signed)
Pt called about her lab results from yesterday from Dr. Silvio Pate Off. There is note she should contact PCP.  Please call back.

## 2016-05-16 NOTE — Telephone Encounter (Signed)
Pt is needing to follow-up w/Dr. Plotnikov concerning her kidney functions. She has an appt already schedule for 5/11, inform she can keep that appt or move up if she want...Johny Chess

## 2016-05-21 ENCOUNTER — Other Ambulatory Visit: Payer: Self-pay | Admitting: Radiology

## 2016-05-22 ENCOUNTER — Ambulatory Visit: Payer: BLUE CROSS/BLUE SHIELD | Admitting: Pulmonary Disease

## 2016-05-22 ENCOUNTER — Other Ambulatory Visit: Payer: Self-pay | Admitting: Physician Assistant

## 2016-05-23 ENCOUNTER — Ambulatory Visit (HOSPITAL_COMMUNITY)
Admission: RE | Admit: 2016-05-23 | Discharge: 2016-05-23 | Disposition: A | Discharge status: Home / Self Care | Payer: BLUE CROSS/BLUE SHIELD | Source: Ambulatory Visit | Attending: Gastroenterology | Admitting: Gastroenterology

## 2016-05-23 ENCOUNTER — Encounter (HOSPITAL_COMMUNITY): Payer: Self-pay

## 2016-05-23 DIAGNOSIS — Z880 Allergy status to penicillin: Secondary | ICD-10-CM | POA: Insufficient documentation

## 2016-05-23 DIAGNOSIS — R7989 Other specified abnormal findings of blood chemistry: Secondary | ICD-10-CM | POA: Diagnosis not present

## 2016-05-23 DIAGNOSIS — K753 Granulomatous hepatitis, not elsewhere classified: Secondary | ICD-10-CM | POA: Diagnosis not present

## 2016-05-23 DIAGNOSIS — Z7901 Long term (current) use of anticoagulants: Secondary | ICD-10-CM

## 2016-05-23 DIAGNOSIS — R160 Hepatomegaly, not elsewhere classified: Secondary | ICD-10-CM

## 2016-05-23 DIAGNOSIS — R634 Abnormal weight loss: Secondary | ICD-10-CM | POA: Diagnosis not present

## 2016-05-23 DIAGNOSIS — Z79899 Other long term (current) drug therapy: Secondary | ICD-10-CM | POA: Insufficient documentation

## 2016-05-23 DIAGNOSIS — R945 Abnormal results of liver function studies: Secondary | ICD-10-CM

## 2016-05-23 LAB — CBC
HEMATOCRIT: 42.3 % (ref 36.0–46.0)
HEMOGLOBIN: 14.5 g/dL (ref 12.0–15.0)
MCH: 29.8 pg (ref 26.0–34.0)
MCHC: 34.3 g/dL (ref 30.0–36.0)
MCV: 87 fL (ref 78.0–100.0)
Platelets: 270 10*3/uL (ref 150–400)
RBC: 4.86 MIL/uL (ref 3.87–5.11)
RDW: 14.7 % (ref 11.5–15.5)
WBC: 6.1 10*3/uL (ref 4.0–10.5)

## 2016-05-23 LAB — PROTIME-INR
INR: 1.18
Prothrombin Time: 15 seconds (ref 11.4–15.2)

## 2016-05-23 LAB — APTT: APTT: 31 s (ref 24–36)

## 2016-05-23 MED ORDER — MIDAZOLAM HCL 2 MG/2ML IJ SOLN
INTRAMUSCULAR | Status: AC
  Filled 2016-05-23: qty 2

## 2016-05-23 MED ORDER — LIDOCAINE HCL 1 % IJ SOLN
INTRAMUSCULAR | Status: AC
  Administered 2016-05-23: 10 mL
  Filled 2016-05-23: qty 20

## 2016-05-23 MED ORDER — FENTANYL CITRATE (PF) 100 MCG/2ML IJ SOLN
INTRAMUSCULAR | Status: AC
  Filled 2016-05-23: qty 2

## 2016-05-23 MED ORDER — HYDROCODONE-ACETAMINOPHEN 5-325 MG PO TABS: 1.0000 | ORAL_TABLET | ORAL | Status: DC | PRN

## 2016-05-23 MED ORDER — GELATIN ABSORBABLE 12-7 MM EX MISC
CUTANEOUS | Status: AC
  Filled 2016-05-23: qty 1

## 2016-05-23 MED ORDER — FENTANYL CITRATE (PF) 100 MCG/2ML IJ SOLN
INTRAMUSCULAR | Status: AC | PRN
  Administered 2016-05-23 (×2): 50 ug via INTRAVENOUS

## 2016-05-23 MED ORDER — MIDAZOLAM HCL 2 MG/2ML IJ SOLN
INTRAMUSCULAR | Status: AC | PRN
  Administered 2016-05-23 (×2): 1 mg via INTRAVENOUS

## 2016-05-23 MED ORDER — SODIUM CHLORIDE 0.9 % IV SOLN: INTRAVENOUS | Status: DC

## 2016-05-23 NOTE — Sedation Documentation (Signed)
Patient is resting comfortably. 

## 2016-05-23 NOTE — Procedures (Signed)
[place] core bx R liver lesion 11N x2 No complication No blood loss. See complete dictation in [facility].

## 2016-05-23 NOTE — Discharge Instructions (Signed)
Liver Biopsy, Care After °These instructions give you information on caring for yourself after your procedure. Your doctor may also give you more specific instructions. Call your doctor if you have any problems or questions after your procedure. °Follow these instructions at home: °· Rest at home for 1-2 days or as told by your doctor. °· Have someone stay with you for at least 24 hours. °· Do not do these things in the first 24 hours: °¨ Drive. °¨ Use machinery. °¨ Take care of other people. °¨ Sign legal documents. °¨ Take a bath or shower. °· There are many different ways to close and cover a cut (incision). For example, a cut can be closed with stitches, skin glue, or adhesive strips. Follow your doctor's instructions on: °¨ Taking care of your cut. °¨ Changing and removing your bandage (dressing). °¨ Removing whatever was used to close your cut. °· Do not drink alcohol in the first week. °· Do not lift more than 5 pounds or play contact sports for the first 2 weeks. °· Take medicines only as told by your doctor. For 1 week, do not take medicine that has aspirin in it or medicines like ibuprofen. °· Get your test results. °Contact a doctor if: °· A cut bleeds and leaves more than just a small spot of blood. °· A cut is red, puffs up (swells), or hurts more than before. °· Fluid or something else comes from a cut. °· A cut smells bad. °· You have a fever or chills. °Get help right away if: °· You have swelling, bloating, or pain in your belly (abdomen). °· You get dizzy or faint. °· You have a rash. °· You feel sick to your stomach (nauseous) or throw up (vomit). °· You have trouble breathing, feel short of breath, or feel faint. °· Your chest hurts. °· You have problems talking or seeing. °· You have trouble balancing or moving your arms or legs. °This information is not intended to replace advice given to you by your health care provider. Make sure you discuss any questions you have with your health care  provider. °Document Released: 10/10/2007 Document Revised: 06/08/2015 Document Reviewed: 02/26/2013 °Elsevier Interactive Patient Education © 2017 Else[employer] °Moderate Conscious Sedation, Adult, Care After °These instructions provide you with information about caring for yourself after your procedure. Your health care provider may also give you more specific instructions. Your treatment has been planned according to current medical practices, but problems sometimes occur. Call your health care provider if you have any problems or questions after your procedure. °What can I expect after the procedure? °After your procedure, it is common: °· To feel sleepy for several hours. °· To feel clumsy and have poor balance for several hours. °· To have poor judgment for several hours. °· To vomit if you eat too soon. °Follow these instructions at home: °For at least 24 hours after the procedure:  ° °· Do not: °¨ Participate in activities where you could fall or become injured. °¨ Drive. °¨ Use heavy machinery. °¨ Drink alcohol. °¨ Take sleeping pills or medicines that cause drowsiness. °¨ Make important decisions or sign legal documents. °¨ Take care of children on your own. °· Rest. °Eating and drinking  °· Follow the diet recommended by your health care provider. °· If you vomit: °¨ Drink water, juice, or soup when you can drink without vomiting. °¨ Make sure you have little or no nausea before eating solid foods. °General instructions  °· Have   a responsible adult stay with you until you are awake and alert. °· Take over-the-counter and prescription medicines only as told by your health care provider. °· If you smoke, do not smoke without supervision. °· Keep all follow-up visits as told by your health care provider. This is important. °Contact a health care provider if: °· You keep feeling nauseous or you keep vomiting. °· You feel light-headed. °· You develop a rash. °· You have a fever. °Get help right away if: °· You  have trouble breathing. °This information is not intended to replace advice given to you by your health care provider. Make sure you discuss any questions you have with your health care provider. °Document Released: 10/21/2012 Document Revised: 06/05/2015 Document Reviewed: 04/22/2015 °Elsevier Interactive Patient Education © 2017 Elsevier Inc. ° °

## 2016-05-23 NOTE — H&P (Signed)
Chief Complaint: Patient was seen in consultation today for liver lesion biopsy at the request of Stark,Malcolm T  Referring Physician(s): Stark,Malcolm T  Supervising Physician: Arne Cleveland  Patient Status: [name] - Out-pt  History of Present Illness: [name] is a 56 y.o. female being worked up for elevated LFTs and newly found right hepatic lobe lesion. She is referred for US guided biopsy PMHx, meds, labs, imaging reviewed. Stopped her Eliquis about 3 days ago as directed. Last done was 5/7 She has been NPO this. Friend at bedside  Past Medical History:  Diagnosis Date  . Allergy   . Anxiety   . Asthma   . Atrial flutter ([place])   . Cataract   . Depression    pt states no depression 07-11-15  . Glaucoma   . Heart murmur    as child only  . Herpes simplex   . Hypertension   . Multiple thyroid nodules   . Osteoarthritis   . Osteoporosis   . PUD (peptic ulcer disease)   . Pulmonary sarcoidosis ([place])   . Tubular adenoma of colon 07/2015  . Vitamin D deficiency     Past Surgical History:  Procedure Laterality Date  . BUNIONECTOMY     x2  . CATARACT EXTRACTION Right   . DENTAL SURGERY    . DILATION AND CURETTAGE OF UTERUS    . fibroid tumor removal    . GLAUCOMA SURGERY     Right eye  . MYOMECTOMY      Allergies: Amoxicillin-pot clavulanate; Cardizem [diltiazem hcl]; Carvedilol; Cefdinir; Cephalexin; Hydrochlorothiazide; Influenza vaccines; Prednisone; Sulfamethoxazole; Sulfonamide derivatives; and Verapamil  Medications: Prior to Admission medications   Medication Sig Start Date End Date Taking? Authorizing Provider  acyclovir (ZOVIRAX) 200 MG capsule Take 200 mg by mouth 2 (two) times daily.   Yes [provider]  amLODipine (NORVASC) 5 MG tablet Take 1 tablet (5 mg total) by mouth daily. 03/27/16 06/25/16 Yes Crenshaw, Denice Bors, MD  apixaban (ELIQUIS) 5 MG TABS tablet Take 1 tablet (5 mg total) by mouth 2 (two) times daily. 01/24/16  Yes  Lelon Perla, MD  dorzolamide-timolol (COSOPT) 22.3-6.8 MG/ML ophthalmic solution Place 1 drop into the left eye daily.  01/20/15  Yes [provider]  flecainide (TAMBOCOR) 50 MG tablet Take 1.5 tablets (75 mg total) by mouth 2 (two) times daily. Patient taking differently: Take 25 mg by mouth 2 (two) times daily.  02/15/16  Yes Camnitz, Will Hassell Done, MD  hydroxypropyl methylcellulose / hypromellose (ISOPTO TEARS / GONIOVISC) 2.5 % ophthalmic solution Place 1 drop into the right eye daily.   Yes [provider]  loratadine (CLARITIN) 10 MG tablet Take 10 mg by mouth daily.   Yes [provider]  losartan (COZAAR) 100 MG tablet TAKE 1 TABLET BY MOUTH ONCE DAILY 07/05/15  Yes Plotnikov, Evie Lacks, MD  Omega-3 300 MG CAPS Take 600 mg by mouth daily.   Yes [provider]  potassium bicarbonate (KLOR-CON/EF) 25 MEQ disintegrating tablet DISSOLVE ONE TABLET IN THE MOUTH TWICE A DAY 08/09/15  Yes Plotnikov, Evie Lacks, MD  Probiotic Product (PROBIOTIC DAILY PO) Take 1 each by mouth daily.   Yes [provider]  vitamin B-12 (CYANOCOBALAMIN) 100 MCG tablet Take 100 mcg by mouth once a week. Reported on 06/20/2015   Yes [provider]  glucose blood test strip 1 each by Other route as directed. 10/05/14   Plotnikov, Evie Lacks, MD  ONE TOUCH LANCETS MISC 1  each by Does not apply route as directed. 10/05/14   Plotnikov, Evie Lacks, MD     Family History  Problem Relation Age of Onset  . Lung cancer Mother        sarcoid  . Stroke Father   . Hypertension Father   . Heart Problems Father   . Stomach cancer Cousin        3rd cousin   . Colon cancer Neg Hx   . Colon polyps Neg Hx   . Esophageal cancer Neg Hx   . Rectal cancer Neg Hx     Social History   Social History  . Marital status: Divorced    Spouse name: N/A  . Number of children: N/A  . Years of education: N/A   Social History Main Topics  . Smoking status: Never Smoker  . Smokeless  tobacco: Never Used  . Alcohol use No  . Drug use: No  . Sexual activity: No   Other Topics Concern  . None   Social History Narrative   Single/divorced; no children; she has a friend now   Never smoked   Positive history of passive tobacco smoke exposure.   Exercise- up to 4 times a week   Caffeine- 1-2 per week     Review of Systems: A 12 point ROS discussed and pertinent positives are indicated in the HPI above.  All other systems are negative.  Review of Systems  Vital Signs: Pulse 95   Temp 98.3 F (36.8 C)   Resp 18   Ht 5' (1.524 m)   Wt 116 lb (52.6 kg)   SpO2 98%   BMI 22.65 kg/m   Physical Exam  Constitutional: She is oriented to person, place, and time. She appears well-developed. No distress.  HENT:  Head: Normocephalic.  Mouth/Throat: Oropharynx is clear and moist.  Neck: Normal range of motion. No tracheal deviation present.  Cardiovascular: Normal rate, regular rhythm and normal heart sounds.   Pulmonary/Chest: Effort normal and breath sounds normal. No respiratory distress.  Abdominal: Soft. She exhibits no distension. There is no tenderness.  Neurological: She is alert and oriented to person, place, and time.  Skin: Skin is warm and dry.  Psychiatric: She has a normal mood and affect.    Mallampati Score:  MD Evaluation Airway: WNL Heart: WNL Abdomen: WNL Chest/ Lungs: WNL ASA  Classification: 3 Mallampati/Airway Score: One  Imaging: Mr Abdomen W Wo Contrast  Result Date: 05/13/2016 CLINICAL DATA:  Elevated LFTs. Indeterminate lesion within liver on ultrasound. EXAM: MRI ABDOMEN WITHOUT AND WITH CONTRAST TECHNIQUE: Multiplanar multisequence MR imaging of the abdomen was performed both before and after the administration of intravenous contrast. CONTRAST:  87mL EOVIST GADOXETATE DISODIUM 0.25 MOL/L IV SOLN COMPARISON:  Ultrasound 03/28/2016 FINDINGS: Exam is degraded by respiratory motion particularly on the post contrast imaging. Lower chest:   Lung bases are clear. Hepatobiliary: There is a region of low signal intensity within the RIGHT lobe of the liver which is poorly defined and hypointense on T1 weighted imaging (image 26, series 5) and measures approximately 4.4 by 5.5 cm within segments 5 and 8. Lesion is more difficult to define on the T2 weighted imaging were is subtly hyperintense in a somewhat vascular distribution (image 11, series 17). Lesion is most well depicted on the delayed 20 minutes imaging post contrast series Measures 4.0 x 4.6 cm in the RIGHT hepatic lobe (image 34, series 19). There is no contrast enhancement through this region. Again there is respiratory  motion through this region. No biliary duct dilatation. Gallbladder is normal. Common bile duct normal. Pancreas: Pancreas is normal.  No duct dilatation or inflammation. Spleen: Normal spleen. Adrenals/urinary tract: Adrenal glands normal. Nonenhancing hemorrhagic cyst of the RIGHT kidney measuring 2 cm. Additional small nonenhancing renal cysts present. Stomach/Bowel: Stomach and limited of the small bowel is unremarkable Vascular/Lymphatic: Abdominal aortic normal caliber. No retroperitoneal periportal lymphadenopathy. Musculoskeletal: No aggressive osseous lesion IMPRESSION: 1. Poorly defined round lesion within the RIGHT hepatic lobe measuring approximately 5 x 5 cm spanning segments 5 and 8. Lesion best depicted on the delayed Eovist imaging were lesion does not accumulate the hepatocytes specific contrast agent. This lesion demonstrates no significant enhancement. Differential would include INFILTRATIVE MALIGNANCY, focal cirrhosis, or vascular phenomena. Recommend correlation with tumor markers. Consider percutaneous biopsy and at minimum recommend follow-up MRI or CT in 3 months. As Patient cannot hold breath well for MRI, CT may provide better quality imaging; however given the subtle findings lesion may be occult by CT. 2. Bosniak 1 and Bosniak 2 renal cysts. These  results will be called to the ordering clinician or representative by the Radiologist Assistant, and communication documented in the PACS or zVision Dashboard. Electronically Signed   By: Suzy Bouchard M.D.   On: 05/13/2016 12:36    Labs:  CBC:  Recent Labs  02/13/16 1535 05/09/16 1404  WBC 8.0 7.1  HGB  --  15.3*  HCT 42.0 45.5  PLT 350 351.0    COAGS:  Recent Labs  05/15/16 1605  INR 1.6*    BMP:  Recent Labs  08/17/15 1436 10/24/15 1019 05/09/16 1404 05/15/16 1605  NA 137 138 139 138  K 4.2 4.0 4.3 3.7  CL 100 100 102 103  CO2 29 29 31 29   GLUCOSE 92 88 84 94  BUN 20 19 21  33*  CALCIUM 10.8* 10.7* 11.6* 12.0*  CREATININE 1.45* 1.47* 1.60* 1.89*    LIVER FUNCTION TESTS:  Recent Labs  10/24/15 1019 05/09/16 1404  BILITOT 0.9 0.6  AST 20 19  ALT 20 21  ALKPHOS 100 118*  PROT 7.8 8.0  ALBUMIN 3.5 3.9    TUMOR MARKERS:  Recent Labs  05/15/16 1605  AFPTM 2.7    Assessment and Plan: Liver lesion. For US guided biopsy Labs pending Risks and Benefits discussed with the patient including, but not limited to bleeding, infection, damage to adjacent structures or low yield requiring additional tests. All of the patient's questions were answered, patient is agreeable to proceed. Consent signed and in chart.   Thank you for this interesting consult.  I greatly enjoyed meeting [name] and look forward to participating in their care.  A copy of this report was sent to the requesting provider on this date.  Electronically Signed: Ascencion Dike 05/23/2016, 12:21 PM   I spent a total of 20 minutes in face to face in clinical consultation, greater than 50% of which was counseling/coordinating care for liver lesion biopsy

## 2016-05-24 ENCOUNTER — Ambulatory Visit: Payer: BLUE CROSS/BLUE SHIELD | Admitting: Internal Medicine

## 2016-05-24 ENCOUNTER — Telehealth: Payer: Self-pay | Admitting: Gastroenterology

## 2016-05-24 NOTE — Telephone Encounter (Signed)
Patient calling [name] back to state that she wants to go to [facility] to see a specialist.

## 2016-05-27 NOTE — Telephone Encounter (Signed)
Patient should see Dr. Lake Bells per Dr. Fuller Plan.  See results notes of liver biopsy.  See results for additional details.

## 2016-05-29 ENCOUNTER — Ambulatory Visit (INDEPENDENT_AMBULATORY_CARE_PROVIDER_SITE_OTHER): Payer: BLUE CROSS/BLUE SHIELD | Admitting: Internal Medicine

## 2016-05-29 ENCOUNTER — Telehealth: Payer: Self-pay | Admitting: Internal Medicine

## 2016-05-29 ENCOUNTER — Other Ambulatory Visit (INDEPENDENT_AMBULATORY_CARE_PROVIDER_SITE_OTHER): Payer: BLUE CROSS/BLUE SHIELD

## 2016-05-29 ENCOUNTER — Telehealth: Payer: Self-pay | Admitting: Gastroenterology

## 2016-05-29 ENCOUNTER — Encounter: Payer: Self-pay | Admitting: Internal Medicine

## 2016-05-29 VITALS — BP 128/98 | HR 97 | Temp 97.7°F | Wt 115.0 lb

## 2016-05-29 DIAGNOSIS — E538 Deficiency of other specified B group vitamins: Secondary | ICD-10-CM | POA: Diagnosis not present

## 2016-05-29 DIAGNOSIS — D869 Sarcoidosis, unspecified: Secondary | ICD-10-CM

## 2016-05-29 DIAGNOSIS — I4891 Unspecified atrial fibrillation: Secondary | ICD-10-CM

## 2016-05-29 LAB — BASIC METABOLIC PANEL
BUN: 21 mg/dL (ref 6–23)
CALCIUM: 12.4 mg/dL — AB (ref 8.4–10.5)
CHLORIDE: 101 meq/L (ref 96–112)
CO2: 30 meq/L (ref 19–32)
Creatinine, Ser: 1.62 mg/dL — ABNORMAL HIGH (ref 0.40–1.20)
GFR: 42.14 mL/min — ABNORMAL LOW (ref >60.00)
GLUCOSE: 84 mg/dL (ref 70–99)
POTASSIUM: 4.3 meq/L (ref 3.5–5.1)
SODIUM: 137 meq/L (ref 135–145)

## 2016-05-29 LAB — URINALYSIS, ROUTINE W REFLEX MICROSCOPIC
BILIRUBIN URINE: NEGATIVE
HGB URINE DIPSTICK: NEGATIVE
KETONES UR: NEGATIVE
NITRITE: NEGATIVE
PH: 7 (ref 5.0–8.0)
RBC / HPF: NONE SEEN (ref 0–?)
Specific Gravity, Urine: 1.01 (ref 1.000–1.030)
Total Protein, Urine: 30 — AB
Urine Glucose: NEGATIVE
Urobilinogen, UA: 0.2 (ref 0.0–1.0)

## 2016-05-29 NOTE — Telephone Encounter (Signed)
Referral entered  

## 2016-05-29 NOTE — Assessment & Plan Note (Signed)
2018 liver lesion - s/p bx The pt would like to be referred to [facility]

## 2016-05-29 NOTE — Assessment & Plan Note (Signed)
On B12 

## 2016-05-29 NOTE — Assessment & Plan Note (Signed)
On diet F/u w/Dr Kerr 

## 2016-05-29 NOTE — Telephone Encounter (Signed)
Patient is advised that she should see Dr. Lake Bells first about the sarcoidosis and if he thinks she should go to a GI at [facility] we can do that at that time.  She verbalized understanding.

## 2016-05-29 NOTE — Progress Notes (Signed)
Subjective:  Patient ID: [name], female    DOB: Nov 30, 1960  Age: 56 y.o. MRN: [id number]  CC: No chief complaint on file.   HPI [name] presents for the liver lesion f/u. Bx shows sarcoidosis. MRI reviewed. F/u HTN, elev Calcium  Outpatient Medications Prior to Visit  Medication Sig Dispense Refill  . acyclovir (ZOVIRAX) 200 MG capsule Take 200 mg by mouth 2 (two) times daily.    [name] amLODipine (NORVASC) 5 MG tablet Take 1 tablet (5 mg total) by mouth daily. 90 tablet 3  . apixaban (ELIQUIS) 5 MG TABS tablet Take 1 tablet (5 mg total) by mouth 2 (two) times daily. 60 tablet 6  . dorzolamide-timolol (COSOPT) 22.3-6.8 MG/ML ophthalmic solution Place 1 drop into the left eye daily.   12  . flecainide (TAMBOCOR) 50 MG tablet Take 1.5 tablets (75 mg total) by mouth 2 (two) times daily. (Patient taking differently: Take 25 mg by mouth 2 (two) times daily. ) 45 tablet 6  . glucose blood test strip 1 each by Other route as directed. (Patient not taking: Reported on 05/23/2016) 100 each 3  . hydroxypropyl methylcellulose / hypromellose (ISOPTO TEARS / GONIOVISC) 2.5 % ophthalmic solution Place 1 drop into the right eye daily.    [name] loratadine (CLARITIN) 10 MG tablet Take 10 mg by mouth daily.    [name] losartan (COZAAR) 100 MG tablet TAKE 1 TABLET BY MOUTH ONCE DAILY 90 tablet 3  . Omega-3 300 MG CAPS Take 600 mg by mouth daily.    . ONE TOUCH LANCETS MISC 1 each by Does not apply route as directed. 100 each 3  . potassium bicarbonate (KLOR-CON/EF) 25 MEQ disintegrating tablet DISSOLVE ONE TABLET IN THE MOUTH TWICE A DAY 60 tablet 11  . Probiotic Product (PROBIOTIC DAILY PO) Take 1 each by mouth daily.    . vitamin B-12 (CYANOCOBALAMIN) 100 MCG tablet Take 100 mcg by mouth once a week. Reported on 06/20/2015     No facility-administered medications prior to visit.     ROS Review of Systems  Constitutional: Negative for activity change, appetite change, chills, fatigue and unexpected  weight change.  HENT: Negative for congestion, mouth sores and sinus pressure.   Eyes: Negative for visual disturbance.  Respiratory: Negative for cough and chest tightness.   Gastrointestinal: Positive for abdominal pain. Negative for nausea and vomiting.  Genitourinary: Negative for difficulty urinating, frequency and vaginal pain.  Musculoskeletal: Negative for back pain and gait problem.  Skin: Negative for pallor and rash.  Neurological: Negative for dizziness, tremors, weakness, numbness and headaches.  Psychiatric/Behavioral: Negative for confusion and sleep disturbance.    Objective:  There were no vitals taken for this visit.  BP Readings from Last 3 Encounters:  05/23/16 124/90  05/15/16 104/68  04/27/16 (!) 136/96    Wt Readings from Last 3 Encounters:  05/23/16 116 lb (52.6 kg)  05/15/16 116 lb 9.6 oz (52.9 kg)  04/27/16 117 lb 1.3 oz (53.1 kg)    Physical Exam  Constitutional: She appears well-developed. No distress.  HENT:  Head: Normocephalic.  Right Ear: External ear normal.  Left Ear: External ear normal.  Nose: Nose normal.  Mouth/Throat: Oropharynx is clear and moist.  Eyes: Conjunctivae are normal. Pupils are equal, round, and reactive to light. Right eye exhibits no discharge. Left eye exhibits no discharge.  Neck: Normal range of motion. Neck supple. No JVD present. No tracheal deviation present. No thyromegaly present.  Cardiovascular: Normal rate, regular rhythm  and normal heart sounds.   Pulmonary/Chest: No stridor. No respiratory distress. She has no wheezes.  Abdominal: Soft. Bowel sounds are normal. She exhibits no distension and no mass. There is no tenderness. There is no rebound and no guarding.  Musculoskeletal: She exhibits no edema or tenderness.  Lymphadenopathy:    She has no cervical adenopathy.  Neurological: She displays normal reflexes. No cranial nerve deficit. She exhibits normal muscle tone. Coordination normal.  Skin: No rash  noted. No erythema.  Psychiatric: She has a normal mood and affect. Her behavior is normal. Judgment and thought content normal.    Lab Results  Component Value Date   WBC 6.1 05/23/2016   HGB 14.5 05/23/2016   HCT 42.3 05/23/2016   PLT 270 05/23/2016   GLUCOSE 94 05/15/2016   CHOL 150 01/25/2013   TRIG 55.0 01/25/2013   HDL 38.90 (L) 01/25/2013   LDLCALC 100 (H) 01/25/2013   ALT 21 05/09/2016   AST 19 05/09/2016   NA 138 05/15/2016   K 3.7 05/15/2016   CL 103 05/15/2016   CREATININE 1.89 (H) 05/15/2016   BUN 33 (H) 05/15/2016   CO2 29 05/15/2016   TSH 2.73 05/09/2016   INR 1.18 05/23/2016   HGBA1C 5.9 05/18/2013    US Biopsy  Result Date: 05/23/2016 CLINICAL DATA:  Ill-defined right lobe hepatic lesion. EXAM: ULTRASOUND GUIDED CORE BIOPSY OF RIGHT LIVER LESION MEDICATIONS: Intravenous Fentanyl and Versed were administered as conscious sedation during continuous monitoring of the patient's level of consciousness and physiological / cardiorespiratory status by the radiology RN, with a total moderate sedation time of 10 minutes. PROCEDURE: The procedure, risks, benefits, and alternatives were explained to the patient. Questions regarding the procedure were encouraged and answered. The patient understands and consents to the procedure. Survey ultrasound of the liver performed and the poorly marginated somewhat hyperechoic right lobe lesion was again localized. An appropriate skin entry site determined and marked. The operative field was prepped with chlorhexidine in a sterile fashion, and a sterile drape was applied covering the operative field. A sterile gown and sterile gloves were used for the procedure. Local anesthesia was provided with 1% Lidocaine. Under real-time ultrasound guidance, a 17 gauge trocar needle was advanced to the margin of the lesion. Once needle tip position was confirmed, coaxial 18-gauge core biopsy samples were obtained, submitted in formalin to surgical  pathology. The guide needle was removed. Postprocedure scans show no hemorrhage or other apparent complication. The patient tolerated the procedure well. COMPLICATIONS: None. FINDINGS: Poorly marginated echogenic lesion in the right hepatic lobe corresponding to MR findings again localized. Representative core biopsy samples obtained as above. IMPRESSION: 1. Technically successful ultrasound-guided core biopsy, right liver lesion. Electronically Signed   By: Lucrezia Europe M.D.   On: 05/23/2016 14:26    Assessment & Plan:   There are no diagnoses linked to this encounter. I am having Ms. Bickert maintain her glucose blood, ONE TOUCH LANCETS, dorzolamide-timolol, vitamin B-12, Omega-3, loratadine, Probiotic Product (PROBIOTIC DAILY PO), losartan, potassium bicarbonate, acyclovir, apixaban, flecainide, amLODipine, and hydroxypropyl methylcellulose / hypromellose.  No orders of the defined types were placed in this encounter.    Follow-up: No Follow-up on file.  Walker Kehr, MD

## 2016-05-29 NOTE — Assessment & Plan Note (Signed)
Eliquis, Flecainide  

## 2016-05-29 NOTE — Telephone Encounter (Signed)
Pt was seen today but called back this morning to let Dr Alain Marion know that she would like to be referred to gastroenterology at [facility]. She said that she was referred to [facility] but based on the recent findings of sarcoidosis of the liver from a biopsy done on May 10th she would like to see GI. She said that she is very concerned with her liver and the weight loss that she is experiencing. She said that she would still see Pulmonary if he thinks that she needs to but she would really like to get the referral to GI at [facility] started so that she can get in to see them. A detailed message can be left on her voice mail.

## 2016-05-31 ENCOUNTER — Encounter: Payer: Self-pay | Admitting: Internal Medicine

## 2016-05-31 ENCOUNTER — Telehealth: Payer: Self-pay | Admitting: *Deleted

## 2016-05-31 MED ORDER — CIPROFLOXACIN HCL 250 MG PO TABS: 250.0000 mg | ORAL_TABLET | Freq: Every day | ORAL | 0 refills | Status: DC

## 2016-05-31 NOTE — Telephone Encounter (Signed)
Rec'd call from pharmacist concerning the ciprofloxacin the antibiotic interacts w/her flecainide. Wanting to know if MD want to still rx...[name]

## 2016-05-31 NOTE — Telephone Encounter (Signed)
Unable to use other abx  Thx

## 2016-06-03 NOTE — Telephone Encounter (Signed)
Notified pharmacist Dondra Spry) w/MD response...[name]

## 2016-06-19 ENCOUNTER — Ambulatory Visit: Payer: BLUE CROSS/BLUE SHIELD | Admitting: Cardiology

## 2016-06-27 ENCOUNTER — Other Ambulatory Visit: Payer: Self-pay | Admitting: Cardiology

## 2016-07-02 ENCOUNTER — Encounter: Payer: Self-pay | Admitting: Pulmonary Disease

## 2016-07-02 ENCOUNTER — Ambulatory Visit (INDEPENDENT_AMBULATORY_CARE_PROVIDER_SITE_OTHER): Payer: BLUE CROSS/BLUE SHIELD | Admitting: Pulmonary Disease

## 2016-07-02 VITALS — BP 124/68 | HR 110 | Ht 60.0 in | Wt 114.4 lb

## 2016-07-02 DIAGNOSIS — D869 Sarcoidosis, unspecified: Secondary | ICD-10-CM

## 2016-07-02 NOTE — Progress Notes (Signed)
Subjective:    Patient ID: [name], female    DOB: 09-02-1960, 56 y.o.   MRN: [id number]  Synopsis: Former patient of Dr. Joya Moore who has sarcoidosis. She also has asthma. She was diagnosed with a skin biopsy in 1983 when she had a rash.   HPI  Chief Complaint  Patient presents with  . Follow-up    pt states she is doing well, does note some sob with heavy exertion.     [name] tells me that she has had a busy year because she has been losing weight this year.  She had not been trying to lose weight and she had an extensive work up from her Endocrinologist and her PCP that showed a lesion in her liver.  She notes that for the last year she has had bowel movements 3-4 times per day whic his more frequent than usual.  She had been exerperiencing a lot of gas. Some of this has resolved, though she still has some abdominal bloating.   She has noted some right sided pain in her abdomen, but this only happened twice.  She noticed some breathing difficulty when she exerts herself too much.  She says that walking too fast made her feel more short of breath. She coughs sometimes, but mucus doesn't come out.    She says that that her PCP's office stopped the inhaler medicine [name]).    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   She has had blood pressure problems for years, but this has been better in the last few years.  She follows with Dr. Stanford Moore and Dr. Curt Moore.    Past Medical History:  Diagnosis Date  . Allergy   . Anxiety   . Asthma   . Atrial flutter ([place])   . Cataract   . Depression    pt states no depression 07-11-15  . Glaucoma   . Heart murmur    as child only  . Herpes simplex   . Hypertension   . Multiple thyroid nodules   . Osteoarthritis   . Osteoporosis   . PUD (peptic ulcer disease)   . Pulmonary sarcoidosis ([place])   . Tubular adenoma of colon 07/2015  . Vitamin D deficiency      Review of Systems  Constitutional:  Negative for chills, fatigue and fever.  HENT: Negative for nosebleeds, postnasal drip, rhinorrhea, sinus pressure and sneezing.   Respiratory: Positive for cough and shortness of breath. Negative for wheezing.   Cardiovascular: Negative for chest pain, palpitations and leg swelling.       Objective:   Physical Exam Vitals:   07/02/16 1607  BP: 124/68  Pulse: (!) 110  SpO2: 96%  Weight: 114 lb 6.4 oz (51.9 kg)  Height: 5' (1.524 m)   RA   Gen: well appearing HENT: OP clear, TM's clear, neck supple PULM: CTA B, normal percussion CV: RRR, no mgr, trace edema GI: BS+, soft, nontender Derm: no cyanosis or rash Psyche: normal mood and affect   Imaging: CXR images from 2016 reviewed: There is significant upper lobe scarring which radiology feels has progressed compared to the prior study  PFT: June 2014 pulmonary function testing ratio 77%, FEV1 1.99 L, 104% predicted), FVC 2.60 L, 108% predicted, total lung capacity 3.75 L (84% predicted), DLCO 20.0 (106% predicted)  Pathology: May 2018 liver biopsy showed noncaseating granulomas, special stains were negative for organisms.  Records from her last visit with her primary care  physician and may reviewed were she requested a referral to [place] for further evaluation of sarcoidosis in the liver. Records revisit in May 2018 with GI also reviewed were she was noted to have an abnormal echotexture on liver ultrasound and then eventually an MRI which showed a distinct lesion in her liver which was worrisome for malignancy.  A biopsy was performed which showed sarcoidosis, see above.     Assessment & Plan:  Sarcoidosis [name] has been experiencing increasing shortness of breath over the last several months in addition to multiple GI complaints with some anorexia, abdominal bloating and bowel changes. She's been found to have a liver lesion which on biopsy is consistent with her known diagnosis of sarcoidosis.  She is in the unfortunate  situation of having an uncommon presentation of an uncommon disease. What I mean by this is that her disease has only slowly progressed since her initial diagnosis with a skin biopsy in the 1980s. Lung function testing performed last year did show some decline compared to the 2014 pulmonary function testing.  While in other diseases active in her liver right now, I like to get a high-resolution CT scan of her chest to see if there is evidence of progressive fibrotic lung disease.  I explained to her today that the best treatment option for this condition is prednisone followed by likely a steroid sparing agent. She is very reluctant to use prednisone because of side effect profile. I spent an extensive amount of time counseling her on the fact that there is really no other treatment option, but I would be willing to quickly change her to a steroid sparing agent if she shows benefit from prednisone use.  Plan: Re: [name] to her cardiologist to talk about any concern for systemic steroid use I will order a high-resolution CT scan of the chest to see if there is evidence of progressive fibrotic lung disease If cardiology is okay with her taking prednisone then I will prescribe 30 mg daily for 2 weeks followed by 20 mg daily for 4 weeks, she will see [place] 6 weeks from now. If her GI symptoms have improved and she has improvement in the size of the liver lesion that I will change her to a steroid sparing agent (likely cellcept)  and plan on treating her for 6-12 months.    Current Outpatient Prescriptions:  .  acyclovir (ZOVIRAX) 200 MG capsule, Take 200 mg by mouth 2 (two) times daily., Disp: , Rfl:  .  amLODipine (NORVASC) 5 MG tablet, Take 1 tablet (5 mg total) by mouth daily., Disp: 90 tablet, Rfl: 3 .  apixaban (ELIQUIS) 5 MG TABS tablet, Take 1 tablet (5 mg total) by mouth 2 (two) times daily., Disp: 60 tablet, Rfl: 6 .  dorzolamide-timolol (COSOPT) 22.3-6.8 MG/ML ophthalmic solution, Place 1 drop into  the left eye daily. , Disp: , Rfl: 12 .  flecainide (TAMBOCOR) 50 MG tablet, Take 50 mg by mouth 2 (two) times daily., Disp: , Rfl:  .  glucose blood test strip, 1 each by Other route as directed., Disp: 100 each, Rfl: 3 .  hydroxypropyl methylcellulose / hypromellose (ISOPTO TEARS / GONIOVISC) 2.5 % ophthalmic solution, Place 1 drop into the right eye daily., Disp: , Rfl:  .  loratadine (CLARITIN) 10 MG tablet, Take 10 mg by mouth daily., Disp: , Rfl:  .  losartan (COZAAR) 100 MG tablet, TAKE 1 TABLET BY MOUTH ONCE DAILY, Disp: 90 tablet, Rfl: 3 .  Omega-3 300 MG CAPS,  Take 600 mg by mouth daily., Disp: , Rfl:  .  ONE TOUCH LANCETS MISC, 1 each by Does not apply route as directed., Disp: 100 each, Rfl: 3 .  potassium bicarbonate (KLOR-CON/EF) 25 MEQ disintegrating tablet, DISSOLVE ONE TABLET IN THE MOUTH TWICE A DAY, Disp: 60 tablet, Rfl: 11 .  Probiotic Product (PROBIOTIC DAILY PO), Take 1 each by mouth daily., Disp: , Rfl:  .  vitamin B-12 (CYANOCOBALAMIN) 100 MCG tablet, Take 100 mcg by mouth once a week. Reported on 06/20/2015, Disp: , Rfl:

## 2016-07-02 NOTE — Assessment & Plan Note (Addendum)
[name] has been experiencing increasing shortness of breath over the last several months in addition to multiple GI complaints with some anorexia, abdominal bloating and bowel changes. She's been found to have a liver lesion which on biopsy is consistent with her known diagnosis of sarcoidosis.  She is in the unfortunate situation of having an uncommon presentation of an uncommon disease. What I mean by this is that her disease has only slowly progressed since her initial diagnosis with a skin biopsy in the 1980s. Lung function testing performed last year did show some decline compared to the 2014 pulmonary function testing.  While in other diseases active in her liver right now, I like to get a high-resolution CT scan of her chest to see if there is evidence of progressive fibrotic lung disease.  I explained to her today that the best treatment option for this condition is prednisone followed by likely a steroid sparing agent. She is very reluctant to use prednisone because of side effect profile. I spent an extensive amount of time counseling her on the fact that there is really no other treatment option, but I would be willing to quickly change her to a steroid sparing agent if she shows benefit from prednisone use.  Plan: I will reach out to her cardiologist to talk about any concern for systemic steroid use I will order a high-resolution CT scan of the chest to see if there is evidence of progressive fibrotic lung disease If cardiology is okay with her taking prednisone then I will prescribe 30 mg daily for 2 weeks followed by 20 mg daily for 4 weeks, she will see [place] 6 weeks from now. If her GI symptoms have improved and she has improvement in the size of the liver lesion that I will change her to a steroid sparing agent (likely cellcept)  and plan on treating her for 6-12 months.  > 50% of this 30 minute visit spent face to face

## 2016-07-02 NOTE — Patient Instructions (Signed)
I am going to talk about your condition with Dr. Stanford Breed and then we will discuss treatment for your sarcoidosis with prednisone afterwards. We will arrange for a high-resolution CT scan of the chest and then call you with the results We will plan on seeing you back in 6 weeks, overbook okay

## 2016-07-03 ENCOUNTER — Telehealth: Payer: Self-pay

## 2016-07-03 ENCOUNTER — Other Ambulatory Visit: Payer: Self-pay

## 2016-07-03 DIAGNOSIS — D869 Sarcoidosis, unspecified: Secondary | ICD-10-CM

## 2016-07-03 DIAGNOSIS — K769 Liver disease, unspecified: Secondary | ICD-10-CM

## 2016-07-03 MED ORDER — PREDNISONE 10 MG PO TABS: ORAL_TABLET | ORAL | 0 refills | Status: DC

## 2016-07-03 NOTE — Telephone Encounter (Signed)
-----   Message from Juanito Doom, MD sent at 07/02/2016  5:23 PM EDT ----- Marykay Lex, I forgot to ask for a MRI of the liver to f/u liver lesion, needs to be 6 weeks from now. Thanks, B

## 2016-07-03 NOTE — Telephone Encounter (Signed)
Spoke with pt, aware of recs.  Mri ordered.  Nothing further needed.

## 2016-07-04 ENCOUNTER — Ambulatory Visit (INDEPENDENT_AMBULATORY_CARE_PROVIDER_SITE_OTHER)
Admission: RE | Admit: 2016-07-04 | Discharge: 2016-07-04 | Disposition: A | Discharge status: Home / Self Care | Payer: BLUE CROSS/BLUE SHIELD | Source: Ambulatory Visit | Attending: Pulmonary Disease | Admitting: Pulmonary Disease

## 2016-07-04 ENCOUNTER — Telehealth: Payer: Self-pay | Admitting: Pulmonary Disease

## 2016-07-04 DIAGNOSIS — Q254 Congenital malformation of aorta unspecified: Secondary | ICD-10-CM

## 2016-07-04 DIAGNOSIS — D869 Sarcoidosis, unspecified: Secondary | ICD-10-CM | POA: Diagnosis not present

## 2016-07-04 NOTE — Telephone Encounter (Signed)
Pt returning call and can be reached @ [phone].[name]

## 2016-07-04 NOTE — Telephone Encounter (Signed)
Spoke with pt, states she cannot start taking prednisone as prescribed yesterday until next weekend d/t work training.  BQ overheard conversation as I was speaking with pt, and is aware.  Also shared ct results with pt while on phone.  Nothing further needed.

## 2016-07-04 NOTE — Telephone Encounter (Signed)
lmtcb X1 for pt  

## 2016-07-08 ENCOUNTER — Other Ambulatory Visit: Payer: Self-pay

## 2016-07-08 DIAGNOSIS — K769 Liver disease, unspecified: Secondary | ICD-10-CM

## 2016-07-09 ENCOUNTER — Other Ambulatory Visit: Payer: BLUE CROSS/BLUE SHIELD

## 2016-07-14 ENCOUNTER — Other Ambulatory Visit: Payer: Self-pay | Admitting: Internal Medicine

## 2016-07-16 ENCOUNTER — Telehealth: Payer: Self-pay | Admitting: Cardiology

## 2016-07-16 NOTE — Telephone Encounter (Signed)
Spoke with pt, she was in a training class last week and was sitting for 7 hours at a time. She started swelling by Thursday and continues to have swelling in feet and legs by midday. Her bp recently at pulmonary was 124/68, she does not check it at home routinely. She denies SOB or orthopnea. She feels the swelling maybe related to the amlodipine or flecainide. The patient reports only taking 1/2 to 1/4 of the flecainide at bedtime because it was causing her heart to beat fast and that was recommended by her pharmacist. Encouraged patient to get up and walk around as much as possible, elevated legs and watch sodium. She would like to know if she can cut back on the amlodipine or flecainide. Aware any decrease in the flecainide will potentially increase the risk of recurrent atrial fib. Will forward for dr Stanford Breed review

## 2016-07-16 NOTE — Telephone Encounter (Signed)
Decrease amlodipine to 5 mg daily and follow BP Kirk Ruths

## 2016-07-16 NOTE — Telephone Encounter (Signed)
Patient is currently taking 5 mg of amlodipine once daily. She also wanted [place] to look at her recent CT scan. She was told there was changes that may affect the heart. Will forward for dr Stanford Breed review

## 2016-07-16 NOTE — Telephone Encounter (Signed)
DC norvasc and follow BP; will need CTA 6/19 to fu TAA Kirk Ruths

## 2016-07-16 NOTE — Telephone Encounter (Signed)
Spoke with pt, Aware of dr crenshaw's recommendations.  °

## 2016-07-16 NOTE — Telephone Encounter (Signed)
Pt says she experiencing ankle,legs and foot swelling,this started last Thursday. She says it goes down at night and it more in the afternoon. She says she needs your advice,she discovered that two of the medicine she takes cause swelling in the feet,legs and ankles.[name]

## 2016-07-16 NOTE — Telephone Encounter (Signed)
Left message for pt to call.

## 2016-07-22 ENCOUNTER — Telehealth: Payer: Self-pay | Admitting: Cardiology

## 2016-07-22 MED ORDER — HYDRALAZINE HCL 25 MG PO TABS: 25.0000 mg | ORAL_TABLET | Freq: Three times a day (TID) | ORAL | 11 refills | Status: DC

## 2016-07-22 NOTE — Telephone Encounter (Signed)
Please call,she wants to give you her Blood Pressure Readings.

## 2016-07-22 NOTE — Telephone Encounter (Signed)
Spoke with pt, Aware of dr crenshaw's recommendations. New script sent to the pharmacy  

## 2016-07-22 NOTE — Telephone Encounter (Signed)
DC amlodipine and begin hydralazine 25 mg TID; follow BP and fu ov next week with APP as scheduled Kirk Ruths

## 2016-07-22 NOTE — Telephone Encounter (Signed)
Spoke with pt, she called to report her bp has been running high and she restarted the amlodipine. Her bp has been 150/101-129/102-139/106-129/87-134/87 and 104/100. She restarted the amlodipine this morning and with the combination of that and prednisone she has swelling in her feet and ankles. She was seen by Dr Dorothyann Peng at [facility] this morning and they are going to be sending information to dr Stanford Breed as they want her to have a cardiac MRI to make sure her sarcoidosis is not effecting the heart. F/u appt made for her to see the APP next week at her request because of her bp. She is very anxious about starting a new med for her bp due to the side effects she has had from previous medications. She is willing to try something else for her bp if dr Stanford Breed would like her to. She wants to make sure he is aware of the medications she has had problems with in the past. Allergy listed reviewed. Will forward for dr Stanford Breed review

## 2016-08-02 ENCOUNTER — Ambulatory Visit (INDEPENDENT_AMBULATORY_CARE_PROVIDER_SITE_OTHER): Payer: BLUE CROSS/BLUE SHIELD | Admitting: Student

## 2016-08-02 ENCOUNTER — Encounter: Payer: Self-pay | Admitting: Student

## 2016-08-02 VITALS — BP 132/92 | HR 66 | Ht 60.0 in | Wt 124.8 lb

## 2016-08-02 DIAGNOSIS — I714 Abdominal aortic aneurysm, without rupture, unspecified: Secondary | ICD-10-CM

## 2016-08-02 DIAGNOSIS — I10 Essential (primary) hypertension: Secondary | ICD-10-CM | POA: Diagnosis not present

## 2016-08-02 DIAGNOSIS — D869 Sarcoidosis, unspecified: Secondary | ICD-10-CM | POA: Diagnosis not present

## 2016-08-02 DIAGNOSIS — I484 Atypical atrial flutter: Secondary | ICD-10-CM | POA: Diagnosis not present

## 2016-08-02 MED ORDER — NEBIVOLOL HCL 5 MG PO TABS: 5.0000 mg | ORAL_TABLET | Freq: Every evening | ORAL | 3 refills | Status: DC

## 2016-08-02 NOTE — Patient Instructions (Addendum)
Medication Instructions:  Your physician has recommended you make the following change in your medication:  1.  START Bystolic 5 mg taking 1 tablet every evening  Labwork: None ordered  Testing/Procedures: Your physician has requested that you have a cardiac MRI. Cardiac MRI uses a computer to create images of your heart as its beating, producing both still and moving pictures of your heart and major blood vessels. For further information please visit [email or web address] Please follow the instruction sheet given to you today for more information.    Follow-Up: Your physician recommends that you schedule a follow-up appointment in: 2 MONTHS WITH DR. CRENSHAW   Any Other Special Instructions Will Be Listed Below (If Applicable).   Magnetic Resonance Imaging Magnetic resonance imaging (MRI) is a painless test that takes pictures of the inside of your body. This test uses a strong magnet. This test does not use X-rays or radiation. What happens before the procedure?  You will be asked to take off all metal. This includes: ? Your watch, jewelry, and other metal items. ? Some makeup may have very small bits of metal and may need to be taken off. ? Braces and fillings normally are not a problem. What happens during the procedure?  You may be given earplugs or headphones to listen to music. The machine can be noisy.  You might get a shot (injection) with a dye (contrast material) to help the MRI take better pictures.  MRI is done in a tunnel-shaped scanner. You will lie on a table that slides into the tunnel-shaped scanner. Once inside, you will still be able to talk to the person doing the test.  You will be asked to hold very still. You will be told when you can shift position. You may have to wait a few minutes to make sure the images are readable. What happens after the procedure?  You may go back to your normal activities right away.  If you got a shot of dye, it will pass  naturally through your body within a day.  Your doctor will talk to you about the results. This information is not intended to replace advice given to you by your health care provider. Make sure you discuss any questions you have with your health care provider. Document Released: 02/02/2010 Document Revised: 06/08/2015 Document Reviewed: 02/25/2013 Elsevier Interactive Patient Education  [employer].    If you need a refill on your cardiac medications before your next appointment, please call your pharmacy.

## 2016-08-02 NOTE — Progress Notes (Signed)
Cardiology Office Note    Date:  08/02/2016   ID:  [name], DOB July 07, 1960, MRN [id number]  PCP:  Cassandria Anger, MD  Cardiologist: Dr. Stanford Breed   Chief Complaint  Patient presents with  . Follow-up    Elevated BP    History of Present Illness:    [name] is a 56 y.o. female with past medical history of PAF (on Eliquis), HTN, OA, Stage 3 CKD, AAA (4.2 cm by CT imaging in 06/2016), and Sarcoidosis who presents to the office today for evaluation of elevated BP.  She was last examined by Dr. Stanford Breed in 03/2016 and reported doing well from a cardiac perspective at that time. She was continued on Flecainide and Eliquis for her atrial flutter. Amlodipine was decreased from 10mg  daily to 5mg  daily in the setting of dizziness.   She called the office in the interim and reported worsening lower extremity edema. Amlodipine was discontinued and she was informed to follow BP closely. She called the office on 7/9 with BP readings of 150/101, 129/102, 129/87, and 104/100. She was therefore started on Hydralazine 25mg  TID as she reported multiple drug intolerances including BB therapy, HCTZ, Cardizem, and Verapamil.  In talking with the patient today, she reports feeling as her lower extremity edema and elevated blood pressure readings were secondary to being started on Prednisone for her recently diagnosed sarcoidosis. She was started on 30 mg daily for 3 weeks and will reduce this to 20 mg daily starting next week. She did not start the Hydralazine due to thinking this had significant side effects in reading about the medication online. She has instead restarted Amlodipine 2.5 mg daily and has been taking Bystolic 5 mg in the evening hours for palpitations. Has also continued on Losartan 100mg  daily. She was on Flecainide but self-discontinued this approximately 3 weeks ago saying it was causing worsening palpitations. Since stopping this, she reports this has significantly  improved.  She denies any recent chest discomfort, orthopnea, PND, lightheadedness, dizziness, or presyncope.  She is being followed by Dr. Dorothyann Peng with [facility] who recommended a Cardiac MRI be performed to evaluate for cardiac sarcoid in the setting of her new diagnosis.     Past Medical History:  Diagnosis Date  . AAA (abdominal aortic aneurysm) (HCC)    a. 4.2 cm by CT in 06/2016  . Allergy   . Anxiety   . Asthma   . Atrial flutter (HCC)    a. on Flecainide and Eliquis  . Cataract   . Depression    pt states no depression 07-11-15  . Glaucoma   . Heart murmur    as child only  . Herpes simplex   . Hypertension   . Multiple thyroid nodules   . Osteoarthritis   . Osteoporosis   . PUD (peptic ulcer disease)   . Pulmonary sarcoidosis ([place])   . Tubular adenoma of colon 07/2015  . Vitamin D deficiency     Past Surgical History:  Procedure Laterality Date  . BUNIONECTOMY     x2  . CATARACT EXTRACTION Right   . DENTAL SURGERY    . DILATION AND CURETTAGE OF UTERUS    . fibroid tumor removal    . GLAUCOMA SURGERY     Right eye  . MYOMECTOMY      Current Medications: Outpatient Medications Prior to Visit  Medication Sig Dispense Refill  . acyclovir (ZOVIRAX) 200 MG capsule Take 200 mg by mouth 2 (two) times  daily.    . apixaban (ELIQUIS) 5 MG TABS tablet Take 1 tablet (5 mg total) by mouth 2 (two) times daily. 60 tablet 6  . dorzolamide-timolol (COSOPT) 22.3-6.8 MG/ML ophthalmic solution Place 1 drop into the left eye daily.   12  . glucose blood test strip 1 each by Other route as directed. 100 each 3  . hydroxypropyl methylcellulose / hypromellose (ISOPTO TEARS / GONIOVISC) 2.5 % ophthalmic solution Place 1 drop into the right eye daily.    [name] loratadine (CLARITIN) 10 MG tablet Take 10 mg by mouth daily.    [name] losartan (COZAAR) 100 MG tablet Take 1 tablet (100 mg total) by mouth daily. Annual appt w/labs due in Oct must see MD for reills 90 tablet 0  . Omega-3  300 MG CAPS Take 600 mg by mouth daily.    . ONE TOUCH LANCETS MISC 1 each by Does not apply route as directed. 100 each 3  . potassium bicarbonate (KLOR-CON/EF) 25 MEQ disintegrating tablet DISSOLVE ONE TABLET IN THE MOUTH TWICE A DAY 60 tablet 11  . predniSONE (DELTASONE) 10 MG tablet Take 3 tabs daily X3 weeks, then 2 tabs daily X3 weeks. 105 tablet 0  . Probiotic Product (PROBIOTIC DAILY PO) Take 1 each by mouth daily.    . vitamin B-12 (CYANOCOBALAMIN) 100 MCG tablet Take 100 mcg by mouth once a week. Reported on 06/20/2015    . flecainide (TAMBOCOR) 50 MG tablet Take 50 mg by mouth 2 (two) times daily.    . hydrALAZINE (APRESOLINE) 25 MG tablet Take 1 tablet (25 mg total) by mouth 3 (three) times daily. 90 tablet 11   No facility-administered medications prior to visit.      Allergies:   Amoxicillin-pot clavulanate; Amoxicillin-pot clavulanate; Cardizem [diltiazem hcl]; Carvedilol; Cefdinir; Cephalexin; Hydrochlorothiazide; Influenza vaccines; Prednisone; Sulfamethoxazole; Sulfasalazine; Sulfonamide derivatives; and Verapamil   Social History   Social History  . Marital status: Divorced    Spouse name: N/A  . Number of children: N/A  . Years of education: N/A   Social History Main Topics  . Smoking status: Never Smoker  . Smokeless tobacco: Never Used  . Alcohol use No  . Drug use: No  . Sexual activity: No   Other Topics Concern  . None   Social History Narrative   Single/divorced; no children; she has a friend now   Never smoked   Positive history of passive tobacco smoke exposure.   Exercise- up to 4 times a week   Caffeine- 1-2 per week     Family History:  The patient's family history includes Heart Problems in her father; Hypertension in her father; Lung cancer in her mother; Stomach cancer in her cousin; Stroke in her father.   Review of Systems:   Please see the history of present illness.     General:  No chills, fever, night sweats or weight changes.    Cardiovascular:  No chest pain, dyspnea on exertion, orthopnea, paroxysmal nocturnal dyspnea. Positive for palpitations and edema.  Dermatological: No rash, lesions/masses Respiratory: No cough, dyspnea Urologic: No hematuria, dysuria Abdominal:   No nausea, vomiting, diarrhea, bright red blood per rectum, melena, or hematemesis Neurologic:  No visual changes, wkns, changes in mental status. All other systems reviewed and are otherwise negative except as noted above.   Physical Exam:    VS:  BP (!) 132/92 (BP Location: Left Arm, Cuff Size: Normal)   Pulse 66   Ht 5' (1.524 m)   Wt 124 lb  12.8 oz (56.6 kg)   SpO2 98%   BMI 24.37 kg/m    General: Well developed, well nourished [place] female appearing in no acute distress. Head: Normocephalic, atraumatic, sclera non-icteric, no xanthomas, nares are without discharge.  Neck: No carotid bruits. JVD not elevated.  Lungs: Respirations regular and unlabored, without wheezes or rales.  Heart: Regular rate and rhythm. No S3 or S4.  No murmur, no rubs, or gallops appreciated. Abdomen: Soft, non-tender, non-distended with normoactive bowel sounds. No hepatomegaly. No rebound/guarding. No obvious abdominal masses. Msk:  Strength and tone appear normal for age. No joint deformities or effusions. Extremities: No clubbing or cyanosis. Trace lower extremity edema.  Distal pedal pulses are 2+ bilaterally. Neuro: Alert and oriented X 3. Moves all extremities spontaneously. No focal deficits noted. Psych:  Responds to questions appropriately with a normal affect. Skin: No rashes or lesions noted  Wt Readings from Last 3 Encounters:  08/02/16 124 lb 12.8 oz (56.6 kg)  07/02/16 114 lb 6.4 oz (51.9 kg)  05/29/16 115 lb (52.2 kg)     Studies/Labs Reviewed:   EKG:  EKG is not ordered today.  Recent Labs: 05/09/2016: ALT 21; TSH 2.73 05/23/2016: Hemoglobin 14.5; Platelets 270 05/29/2016: BUN 21; Creatinine, Ser 1.62; Potassium 4.3; Sodium  137   Lipid Panel    Component Value Date/Time   CHOL 150 01/25/2013 0840   TRIG 55.0 01/25/2013 0840   TRIG 86 01/02/2006 0927   HDL 38.90 (L) 01/25/2013 0840   CHOLHDL 4 01/25/2013 0840   VLDL 11.0 01/25/2013 0840   LDLCALC 100 (H) 01/25/2013 0840    Additional studies/ records that were reviewed today include:   ETT: 03/21/2016  Blood pressure demonstrated a normal response to exercise.  There was no ST segment deviation noted during stress.   ETT with mildly impaired exercise tolerance (4:43); no chest pain; normal BP response; no ST changes; no exercise induced ventricular tachycardia; negative inadequate ETT (pt achieved 64% predicted max HR).   CT Chest: 06/2016 1. Significant interval progression of pulmonary sarcoidosis since 06/05/2012, with progressive central and upper lung predominant nodular peribronchovascular interstitial thickening, volume loss, bronchiectasis and parenchymal distortion. 2. Mediastinal and bilateral hilar lymphadenopathy is increased, compatible with sarcoidosis. 3. Ill-defined low-attenuation focus throughout much of the right liver lobe, biopsy-proven to represent hepatic sarcoidosis, better delineated on the recent MRI abdomen study. 4. Mild cardiomegaly.  Small pericardial effusion/thickening. 5. Ectatic 4.2 cm ascending thoracic aorta, stable. Recommend annual imaging followup by CTA or MRA. This recommendation follows 2010  Assessment:    1. Essential hypertension   2. Atypical atrial flutter (HCC)   3. Abdominal aortic aneurysm (AAA) 3.0 cm to 5.0 cm in diameter in female ([place])   4. Sarcoidosis      Plan:   In order of problems listed above:  1. Essential HTN - Home BP has been variable per the patient's report since being started on Prednisone for sarcoidosis. SBP has mostly been in the 130's - 102'V with diastolics in the 25'D - 66'Y in reviewing her BP diary.  - she brings with her a home BP cuff and this is reading 5-7  points higher than our office cuffs. During the exam, her BP machine fell off of the table sustaining a crack along the electrical box but was still functioning normally. I recommended she obtain a new cuff if readings become significantly variable.  - she has multiple drug intolerances and had previously stopped Amlodipine due to thinking this was causing  her worsening edema. She was prescribed Hydralazine but never started this due to reading about the side-effects, therefore she restarted her Amlodipine at a lower dose of 2.5mg  daily and has continued on Losartan 100mg  daily and PRN Bystolic.  - I recommended she continue on Amlodipine 2.5mg  daily (does not wish to take a higher dose secondary to edema) and Losartan 100mg  daily along with taking Bystolic 5mg  daily regularly in the setting of her variable BP readings.   2. Paroxysmal Atrial Flutter - she had been on Flecainide but self-discontinued this approximately 3 weeks ago as to thinking it was causing worsening palpitations and reports improvement in her symptoms since stopping this. We reviewed the importance of this as an antiarrhythmic but she wishes to remain off of it. She has been taking PRN Bystolic (cutting her 20mg  tablet into forths and only taking 5mg  at a time). Will start taking Bystolic 5mg  daily for rate-control and to assist with BP as she denies any current side effects to this medication.  - she denies any evidence of active bleeding. Continue Eliquis 5mg  BID for anticoagulation.    3. AAA - 4.2 cm by CT Imaging in 06/2016. Will plan for repeat CT in 06/2017.  4. Sarcoidosis - this is being followed by Dr. Dorothyann Peng with [facility] who recommended a cardiac MRI be performed to rule out cardiac sarcoid. Will place an order for this today.    Medication Adjustments/Labs and Tests Ordered: Current medicines are reviewed at length with the patient today.  Concerns regarding medicines are outlined above.  Medication changes,  Labs and Tests ordered today are listed in the Patient Instructions below. Patient Instructions  Medication Instructions:  Your physician has recommended you make the following change in your medication:  1.  START Bystolic 5 mg taking 1 tablet every evening  Labwork: None ordered  Testing/Procedures: Your physician has requested that you have a cardiac MRI. Cardiac MRI uses a computer to create images of your heart as its beating, producing both still and moving pictures of your heart and major blood vessels. For further information please visit [email or web address] Please follow the instruction sheet given to you today for more information.  Follow-Up: Your physician recommends that you schedule a follow-up appointment in: 2 MONTHS WITH DR. CRENSHAW  If you need a refill on your cardiac medications before your next appointment, please call your pharmacy.   Signed, Erma Heritage, PA-C  08/02/2016 9:33 PM    [facility] [place], [place], [place]  [address] Phone: [phone]; Fax: [phone]  [address] [place], [place] [address] Phone: [phone]

## 2016-08-04 ENCOUNTER — Other Ambulatory Visit: Payer: Self-pay | Admitting: Cardiology

## 2016-08-04 DIAGNOSIS — I4892 Unspecified atrial flutter: Secondary | ICD-10-CM

## 2016-08-06 ENCOUNTER — Telehealth: Payer: Self-pay | Admitting: Student

## 2016-08-06 ENCOUNTER — Encounter: Payer: Self-pay | Admitting: Student

## 2016-08-06 NOTE — Telephone Encounter (Signed)
Called the patient and gave date, time and location of cardiac MRI.  Letter mailed to the patient and message to nurse.

## 2016-08-07 ENCOUNTER — Telehealth: Payer: Self-pay | Admitting: Internal Medicine

## 2016-08-07 DIAGNOSIS — D869 Sarcoidosis, unspecified: Secondary | ICD-10-CM

## 2016-08-07 NOTE — Telephone Encounter (Signed)
Pt asked that we look out for a fax  for orders for lab work from [facility] for her to get done here, the labs are supposed to be done ASAP

## 2016-08-07 NOTE — Telephone Encounter (Signed)
Routing to betty----just in case you get this fax

## 2016-08-08 ENCOUNTER — Other Ambulatory Visit (INDEPENDENT_AMBULATORY_CARE_PROVIDER_SITE_OTHER): Payer: BLUE CROSS/BLUE SHIELD

## 2016-08-08 DIAGNOSIS — D869 Sarcoidosis, unspecified: Secondary | ICD-10-CM | POA: Diagnosis not present

## 2016-08-08 LAB — HEPATIC FUNCTION PANEL
ALT: 61 U/L — ABNORMAL HIGH (ref 0–35)
AST: 27 U/L (ref 0–37)
Albumin: 3.5 g/dL (ref 3.5–5.2)
Alkaline Phosphatase: 125 U/L — ABNORMAL HIGH (ref 39–117)
BILIRUBIN DIRECT: 0.3 mg/dL (ref 0.0–0.3)
BILIRUBIN TOTAL: 1 mg/dL (ref 0.2–1.2)
TOTAL PROTEIN: 6.1 g/dL (ref 6.0–8.3)

## 2016-08-08 NOTE — Telephone Encounter (Signed)
Pt called checking on these. She said that she is in the area and would like to have them done as soon as possible.

## 2016-08-08 NOTE — Telephone Encounter (Signed)
HFP ordered.

## 2016-08-09 ENCOUNTER — Telehealth: Payer: Self-pay | Admitting: Student

## 2016-08-09 NOTE — Telephone Encounter (Signed)
Pt wants to know what is the status of her Bystolic prescription?

## 2016-08-09 NOTE — Telephone Encounter (Signed)
See other telephone note.  

## 2016-08-09 NOTE — Telephone Encounter (Signed)
Pt wants to know when she have her MRI on Monday will they be checking for blockages?

## 2016-08-09 NOTE — Telephone Encounter (Signed)
Returned call to patient-patient wondering if MRI will be looking for blockages.   Advised per prior OV note-MRI will be evaluating for sarcoids, MRI is not a definitive evaluation for blockages.   Also states she was told that she needed a PA for her bystolic.  Currently has samples.   Advised I would route to primary nurse to complete and to notify us if she needs further samples.  Patient aware and verbalized understanding.

## 2016-08-10 ENCOUNTER — Other Ambulatory Visit: Payer: Self-pay | Admitting: Pulmonary Disease

## 2016-08-12 ENCOUNTER — Ambulatory Visit (HOSPITAL_COMMUNITY)
Admission: RE | Admit: 2016-08-12 | Discharge: 2016-08-12 | Disposition: A | Discharge status: Home / Self Care | Payer: BLUE CROSS/BLUE SHIELD | Source: Ambulatory Visit | Attending: Student | Admitting: Student

## 2016-08-12 ENCOUNTER — Ambulatory Visit (HOSPITAL_COMMUNITY): Payer: BLUE CROSS/BLUE SHIELD

## 2016-08-12 DIAGNOSIS — D869 Sarcoidosis, unspecified: Secondary | ICD-10-CM | POA: Diagnosis not present

## 2016-08-12 DIAGNOSIS — I081 Rheumatic disorders of both mitral and tricuspid valves: Secondary | ICD-10-CM | POA: Diagnosis not present

## 2016-08-12 LAB — CREATININE, SERUM
CREATININE: 1.38 mg/dL — AB (ref 0.44–1.00)
GFR, EST AFRICAN AMERICAN: 48 mL/min — AB (ref >60)
GFR, EST NON AFRICAN AMERICAN: 42 mL/min — AB (ref >60)

## 2016-08-12 MED ORDER — GADOBENATE DIMEGLUMINE 529 MG/ML IV SOLN
20.0000 mL | Freq: Once | INTRAVENOUS | Status: AC
  Administered 2016-08-12: 18 mL via INTRAVENOUS

## 2016-08-14 NOTE — Telephone Encounter (Signed)
PA for bystolic submitted via [email or web address] Key: Leisa Lenz

## 2016-08-14 NOTE — Telephone Encounter (Signed)
Follow up      Calling to check status on prior authorization on bystolic.  Form has already been faxed.  Please call

## 2016-08-15 ENCOUNTER — Telehealth: Payer: Self-pay | Admitting: Student

## 2016-08-15 ENCOUNTER — Encounter: Payer: Self-pay | Admitting: Pulmonary Disease

## 2016-08-15 ENCOUNTER — Ambulatory Visit (INDEPENDENT_AMBULATORY_CARE_PROVIDER_SITE_OTHER): Payer: BLUE CROSS/BLUE SHIELD | Admitting: Pulmonary Disease

## 2016-08-15 VITALS — BP 140/70 | HR 75 | Ht 60.0 in | Wt 122.0 lb

## 2016-08-15 DIAGNOSIS — D869 Sarcoidosis, unspecified: Secondary | ICD-10-CM | POA: Diagnosis not present

## 2016-08-15 DIAGNOSIS — R0602 Shortness of breath: Secondary | ICD-10-CM

## 2016-08-15 DIAGNOSIS — K769 Liver disease, unspecified: Secondary | ICD-10-CM

## 2016-08-15 DIAGNOSIS — R05 Cough: Secondary | ICD-10-CM | POA: Diagnosis not present

## 2016-08-15 DIAGNOSIS — R059 Cough, unspecified: Secondary | ICD-10-CM

## 2016-08-15 NOTE — Telephone Encounter (Signed)
Returned call, goes to VM. Left msg advising samples available.  Medication Samples have been provided to the patient.  Drug name: Bystolic 5mg Qty: 28LOT: [id number].Date: 07/2018  The patient has been instructed regarding the correct time, dose, and frequency of taking this medication, including desired effects and most common side effects.

## 2016-08-15 NOTE — Telephone Encounter (Signed)
Patient calling the office for samples of medication:   1.  What medication and dosage are you requesting samples for?Bystolic-she says she is in the area now,would like to pick them up now if possible please  2.  Are you currently out of this medication? Just a few,she is in the area ,live out of the area,did not want to keep coming back and forth

## 2016-08-15 NOTE — Patient Instructions (Addendum)
For your sarcoidosis: Continue taking prendisone as you are doing Once you decrease to dose to 10mg  daily stay on that dose until you see me next We will repeat a CT scan of your Chest in December 2018  For the liver lesion: We will change the date of the MRI liver to December 2018  I am going to reach out to Dr. Dorothyann Peng to help coordinate your care  We will see you back in 3 months or sooner if needed

## 2016-08-15 NOTE — Addendum Note (Signed)
Addended by: Len Blalock on: 08/15/2016 11:30 AM   Modules accepted: Orders

## 2016-08-15 NOTE — Progress Notes (Signed)
Subjective:    Patient ID: [name], female    DOB: 1960-09-23, 56 y.o.   MRN: [id number]  Synopsis: Former patient of Dr. Joya Gaskins who has sarcoidosis. She also has asthma. She was diagnosed with a skin biopsy in 1983 when she had a rash.  The disease had been relatively quiet for years but in 2018 she developed a large liver mass which was noted to be full of non-caseating granulomas on biopsy.  A HRCT performed in 2018 showed worsening consolidative changes in her lungs bilaterally consistent with sarcoidosis.  She started taking prednisone on June 30.   HPI  Chief Complaint  Patient presents with  . Follow-up    review CT chest.  pt states she is is doing well, breathing is at baseline.     Fumi is doing better.  She says that the prednisone helped.    She says that she saw [facility].    On prednisone: > she noted foot and ankle swelling > she is currently taking 20mg  daily and the ankle swelling has improved > her bowel movements have improved > her skin changes have improved  Hypertension: > a bit worse lately but with adjustments in the bistolic and amlodipine she has seen improvement > still on losartan > treatment guided by Dr. Stanford Breed  She had a fall and had a bruise on her left shin develop.   She has mild dyspnea on exertion, only has an occassional cough but this has remarkably improved on prednisone.    Past Medical History:  Diagnosis Date  . AAA (abdominal aortic aneurysm) (HCC)    a. 4.2 cm by CT in 06/2016  . Allergy   . Anxiety   . Asthma   . Atrial flutter (HCC)    a. on Flecainide and Eliquis  . Cataract   . Depression    pt states no depression 07-11-15  . Glaucoma   . Heart murmur    as child only  . Herpes simplex   . Hypertension   . Multiple thyroid nodules   . Osteoarthritis   . Osteoporosis   . PUD (peptic ulcer disease)   . Pulmonary sarcoidosis ([place])   . Tubular adenoma of colon 07/2015  . Vitamin D deficiency       Review of Systems  Constitutional: Negative for chills, fatigue and fever.  HENT: Negative for nosebleeds, postnasal drip, rhinorrhea, sinus pressure and sneezing.   Respiratory: Negative for cough, shortness of breath and wheezing.   Cardiovascular: Negative for chest pain, palpitations and leg swelling.       Objective:   Physical Exam Vitals:   08/15/16 1026  BP: 140/70  Pulse: 75  SpO2: 98%  Weight: 122 lb (55.3 kg)  Height: 5' (1.524 m)   RA  Gen: well appearing HENT: OP clear, TM's clear, neck supple PULM: Crackles bilaterally, mild, normal percussion CV: RRR, no mgr, trace edema GI: BS+, soft, nontender Derm: no cyanosis or rash Psyche: normal mood and affect   Imaging: CXR images from 2016 reviewed: There is significant upper lobe scarring which radiology feels has progressed compared to the prior study 06/2016 HRCT> significant interval worsening of pulmonary parenchymal abnormality, mediastinal lymphadenopathy worse; also with ectactic aorta, suggestion of pulmonary hypertension 07/2016 Cardiac MRI: no evidence of sarcoidosis, mild LAE and RAE, LVEF 58%  PFT: June 2014 pulmonary function testing ratio 77%, FEV1 1.99 L, 104% predicted), FVC 2.60 L, 108% predicted, total lung capacity 3.75 L (84% predicted), DLCO 20.0 (  106% predicted)  Pathology: May 2018 liver biopsy showed noncaseating granulomas, special stains were negative for organisms.  Records from her last visit with her primary care physician and may reviewed were she requested a referral to [facility] for further evaluation of sarcoidosis in the liver. Records revisit in May 2018 with GI also reviewed were she was noted to have an abnormal echotexture on liver ultrasound and then eventually an MRI which showed a distinct lesion in her liver which was worrisome for malignancy.  A biopsy was performed which showed sarcoidosis, see above.     Assessment & Plan:  Sarcoidosis  Cough  Liver lesion - Plan:  MR LIVER W WO CONTRAST  Shortness of breath - Plan: CT CHEST HIGH RESOLUTION  Discussion: Ms. Budden had significant improvement in her GI symptoms as well as the cough since starting prednisone. She's had some side effects of leg swelling but in general she feels like she is doing well with it. Given the significant worsening in pulmonary parenchymal disease which was seen on her June 2018 CT chest I think she is going to need at least 6 months of therapy more likely 1 year. Given the remarkable response to prednisone so far all continue that drug for a total of 6 months and then repeat liver and chest imaging and decide at that point what the next step should be. I agree with the physicians at [facility] that using a low-dose of prednisone for now is reasonable. However, if she has worsening of her disease or if we haven't seen any improvement after 6 months and I think changing to a steroid sparing agent like Imuran or methotrexate would be reasonable.  Plan: For your sarcoidosis: Continue taking prendisone as you are doing Once you decrease to dose to 10mg  daily stay on that dose until you see me next We will repeat a CT scan of your Chest in December 2018  For the liver lesion: We will change the date of the MRI liver to December 2018  I am going to reach out to Dr. Dorothyann Peng to help coordinate your care  We will see you back in 3 months or sooner if needed  > 50% of this 30 minute visit spent face to face    Current Outpatient Prescriptions:  .  acyclovir (ZOVIRAX) 200 MG capsule, Take 200 mg by mouth daily. , Disp: , Rfl:  .  amLODipine (NORVASC) 5 MG tablet, Take 2.5 mg by mouth daily., Disp: , Rfl: 3 .  dorzolamide-timolol (COSOPT) 22.3-6.8 MG/ML ophthalmic solution, Place 1 drop into the left eye daily. , Disp: , Rfl: 12 .  ELIQUIS 5 MG TABS tablet, TAKE 1 TABLET (5 MG TOTAL) BY MOUTH 2 (TWO) TIMES DAILY., Disp: 60 tablet, Rfl: 3 .  glucose blood test strip, 1 each by Other route as  directed., Disp: 100 each, Rfl: 3 .  hydroxypropyl methylcellulose / hypromellose (ISOPTO TEARS / GONIOVISC) 2.5 % ophthalmic solution, Place 1 drop into the right eye daily., Disp: , Rfl:  .  loratadine (CLARITIN) 10 MG tablet, Take 10 mg by mouth daily., Disp: , Rfl:  .  losartan (COZAAR) 100 MG tablet, Take 1 tablet (100 mg total) by mouth daily. Annual appt w/labs due in Oct must see MD for reills, Disp: 90 tablet, Rfl: 0 .  nebivolol (BYSTOLIC) 5 MG tablet, Take 1 tablet (5 mg total) by mouth every evening., Disp: 90 tablet, Rfl: 3 .  Omega-3 300 MG CAPS, Take 2 capsules by mouth daily. ,  Disp: , Rfl:  .  ONE TOUCH LANCETS MISC, 1 each by Does not apply route as directed., Disp: 100 each, Rfl: 3 .  potassium bicarbonate (KLOR-CON/EF) 25 MEQ disintegrating tablet, DISSOLVE ONE TABLET IN THE MOUTH TWICE A DAY, Disp: 60 tablet, Rfl: 11 .  predniSONE (DELTASONE) 10 MG tablet, Take 2 tablets (20 mg total) by mouth daily., Disp: 60 tablet, Rfl: 0 .  Probiotic Product (PROBIOTIC DAILY PO), Take 1 each by mouth daily., Disp: , Rfl:  .  vitamin B-12 (CYANOCOBALAMIN) 100 MCG tablet, Take 100 mcg by mouth once a week. Reported on 06/20/2015, Disp: , Rfl:

## 2016-08-15 NOTE — Telephone Encounter (Signed)
Pt aware samples available at front desk at [facility] office.

## 2016-08-19 ENCOUNTER — Encounter: Payer: Self-pay | Admitting: *Deleted

## 2016-08-19 NOTE — Telephone Encounter (Signed)
PA denied. Appeals letter faxed to [place] [employer] [place] @ [phone]. Patient made aware. She reports having enough samples for this week.

## 2016-08-22 ENCOUNTER — Ambulatory Visit (HOSPITAL_COMMUNITY): Payer: BLUE CROSS/BLUE SHIELD

## 2016-08-26 ENCOUNTER — Other Ambulatory Visit: Payer: BLUE CROSS/BLUE SHIELD

## 2016-08-28 ENCOUNTER — Other Ambulatory Visit: Payer: Self-pay | Admitting: Internal Medicine

## 2016-09-04 ENCOUNTER — Ambulatory Visit (INDEPENDENT_AMBULATORY_CARE_PROVIDER_SITE_OTHER): Payer: BLUE CROSS/BLUE SHIELD | Admitting: Internal Medicine

## 2016-09-04 ENCOUNTER — Encounter: Payer: Self-pay | Admitting: Internal Medicine

## 2016-09-04 ENCOUNTER — Telehealth: Payer: Self-pay | Admitting: Internal Medicine

## 2016-09-04 ENCOUNTER — Telehealth: Payer: Self-pay | Admitting: Cardiology

## 2016-09-04 DIAGNOSIS — R739 Hyperglycemia, unspecified: Secondary | ICD-10-CM | POA: Diagnosis not present

## 2016-09-04 DIAGNOSIS — D869 Sarcoidosis, unspecified: Secondary | ICD-10-CM

## 2016-09-04 DIAGNOSIS — I4892 Unspecified atrial flutter: Secondary | ICD-10-CM

## 2016-09-04 DIAGNOSIS — E538 Deficiency of other specified B group vitamins: Secondary | ICD-10-CM | POA: Diagnosis not present

## 2016-09-04 DIAGNOSIS — I10 Essential (primary) hypertension: Secondary | ICD-10-CM

## 2016-09-04 DIAGNOSIS — E876 Hypokalemia: Secondary | ICD-10-CM | POA: Diagnosis not present

## 2016-09-04 MED ORDER — APIXABAN 5 MG PO TABS: 5.0000 mg | ORAL_TABLET | Freq: Two times a day (BID) | ORAL | 3 refills | Status: DC

## 2016-09-04 NOTE — Assessment & Plan Note (Signed)
Losartan, Norvasc 

## 2016-09-04 NOTE — Telephone Encounter (Signed)
Patient calling, states that she may have to have an extraction or filling. Patient would like to know if she has to have either procedure, would she need to suspend Eliquis and for how long?  Patient has not scheduled the procedure and will have a check up with dentist on Tuesday 09-10-16.

## 2016-09-04 NOTE — Assessment & Plan Note (Signed)
On Prednisone - much better

## 2016-09-04 NOTE — Telephone Encounter (Signed)
FYI

## 2016-09-04 NOTE — Telephone Encounter (Signed)
Advised patient no need to hold Eliquis for one tooth extraction or filling

## 2016-09-04 NOTE — Assessment & Plan Note (Signed)
Labs

## 2016-09-04 NOTE — Telephone Encounter (Signed)
Pt was here for a visit with Dr Alain Marion and said that she forgot to mention one thing to him. She said that she is currently taking Klor-con and noticed 4 different times that she felt some discomfort behind her naval while taking it. She was previously dissolving it in orange juice and water but has recently switched to grape juice and water. She last time she noticed this discomfort was on Monday and that there were no issues before starting the prednisone. No changes are necessary, she just wanted Dr Alain Marion to be aware.

## 2016-09-04 NOTE — Assessment & Plan Note (Signed)
On B12 

## 2016-09-04 NOTE — Progress Notes (Signed)
Subjective:  Patient ID: [name], female    DOB: 21-Jul-1960  Age: 56 y.o. MRN: [id number]  CC: No chief complaint on file.   HPI [name] presents for HTN, sarcoidosis, low K f/u. Feeling better on Prednisone  Outpatient Medications Prior to Visit  Medication Sig Dispense Refill  . acyclovir (ZOVIRAX) 200 MG capsule Take 200 mg by mouth daily.     [name] amLODipine (NORVASC) 5 MG tablet Take 2.5 mg by mouth daily.  3  . dorzolamide-timolol (COSOPT) 22.3-6.8 MG/ML ophthalmic solution Place 1 drop into the left eye daily.   12  . ELIQUIS 5 MG TABS tablet TAKE 1 TABLET (5 MG TOTAL) BY MOUTH 2 (TWO) TIMES DAILY. 60 tablet 3  . glucose blood test strip 1 each by Other route as directed. 100 each 3  . hydroxypropyl methylcellulose / hypromellose (ISOPTO TEARS / GONIOVISC) 2.5 % ophthalmic solution Place 1 drop into the right eye daily.    [name] loratadine (CLARITIN) 10 MG tablet Take 10 mg by mouth daily.    [name] losartan (COZAAR) 100 MG tablet Take 1 tablet (100 mg total) by mouth daily. Annual appt w/labs due in Oct must see MD for reills 90 tablet 0  . nebivolol (BYSTOLIC) 5 MG tablet Take 1 tablet (5 mg total) by mouth every evening. 90 tablet 3  . Omega-3 300 MG CAPS Take 2 capsules by mouth daily.     . ONE TOUCH LANCETS MISC 1 each by Does not apply route as directed. 100 each 3  . potassium bicarbonate (KLOR-CON/EF) 25 MEQ disintegrating tablet Take 1 tablet (25 mEq total) by mouth 2 (two) times daily. Annual appt due in Oct w/labs must see provider for future refills 60 tablet 1  . predniSONE (DELTASONE) 10 MG tablet Take 2 tablets (20 mg total) by mouth daily. (Patient taking differently: Take 10 mg by mouth daily. ) 60 tablet 0  . Probiotic Product (PROBIOTIC DAILY PO) Take 1 each by mouth daily.    . vitamin B-12 (CYANOCOBALAMIN) 100 MCG tablet Take 100 mcg by mouth once a week. Reported on 06/20/2015     No facility-administered medications prior to visit.     ROS Review of  Systems  Constitutional: Positive for fatigue. Negative for activity change, appetite change, chills and unexpected weight change.  HENT: Negative for congestion, mouth sores and sinus pressure.   Eyes: Negative for visual disturbance.  Respiratory: Negative for cough and chest tightness.   Gastrointestinal: Negative for abdominal pain and nausea.  Genitourinary: Negative for difficulty urinating, frequency and vaginal pain.  Musculoskeletal: Negative for back pain and gait problem.  Skin: Negative for pallor and rash.  Neurological: Negative for dizziness, tremors, weakness, numbness and headaches.  Hematological: Bruises/bleeds easily.  Psychiatric/Behavioral: Negative for confusion and sleep disturbance.    Objective:  BP (!) 158/102   Pulse 76   Temp 98.3 F (36.8 C)   Wt 121 lb (54.9 kg)   SpO2 99%   BMI 23.63 kg/m   BP Readings from Last 3 Encounters:  09/04/16 (!) 158/102  08/15/16 140/70  08/02/16 (!) 132/92    Wt Readings from Last 3 Encounters:  09/04/16 121 lb (54.9 kg)  08/15/16 122 lb (55.3 kg)  08/02/16 124 lb 12.8 oz (56.6 kg)    Physical Exam  Constitutional: She appears well-developed. No distress.  HENT:  Head: Normocephalic.  Right Ear: External ear normal.  Left Ear: External ear normal.  Nose: Nose normal.  Mouth/Throat: Oropharynx  is clear and moist.  Eyes: Pupils are equal, round, and reactive to light. Conjunctivae are normal. Right eye exhibits no discharge. Left eye exhibits no discharge.  Neck: Normal range of motion. Neck supple. No JVD present. No tracheal deviation present. No thyromegaly present.  Cardiovascular: Normal rate, regular rhythm and normal heart sounds.   Pulmonary/Chest: No stridor. No respiratory distress. She has no wheezes.  Abdominal: Soft. Bowel sounds are normal. She exhibits no distension and no mass. There is no tenderness. There is no rebound and no guarding.  Musculoskeletal: She exhibits edema and tenderness.    Lymphadenopathy:    She has no cervical adenopathy.  Neurological: She displays normal reflexes. No cranial nerve deficit. She exhibits normal muscle tone. Coordination normal.  Skin: No rash noted. No erythema.  Psychiatric: She has a normal mood and affect. Her behavior is normal. Judgment and thought content normal.  L shin is bruised  Lab Results  Component Value Date   WBC 6.1 05/23/2016   HGB 14.5 05/23/2016   HCT 42.3 05/23/2016   PLT 270 05/23/2016   GLUCOSE 84 05/29/2016   CHOL 150 01/25/2013   TRIG 55.0 01/25/2013   HDL 38.90 (L) 01/25/2013   LDLCALC 100 (H) 01/25/2013   ALT 61 (H) 08/08/2016   AST 27 08/08/2016   NA 137 05/29/2016   K 4.3 05/29/2016   CL 101 05/29/2016   CREATININE 1.38 (H) 08/12/2016   BUN 21 05/29/2016   CO2 30 05/29/2016   TSH 2.73 05/09/2016   INR 1.18 05/23/2016   HGBA1C 5.9 05/18/2013    Mr Card Morphology Wo/w Cm  Result Date: 08/12/2016 CLINICAL DATA:  Sarcoid EXAM: CARDIAC MRI TECHNIQUE: The patient was scanned on a 1.5 Tesla GE magnet. A dedicated cardiac coil was used. Functional imaging was done using Fiesta sequences. 2,3, and 4 chamber views were done to assess for RWMA's. Modified Simpson's rule using a short axis stack was used to calculate an ejection fraction on a dedicated work Conservation officer, nature. The patient received 18 cc of Multihance. After 10 minutes inversion recovery sequences were used to assess for infiltration and scar tissue. CONTRAST:  18 cc Multihance FINDINGS: There was moderate LAE. There was mild RAE. The RV was normal in size and function. Aortic root was normal 3.2 cm. There was no ASD/VSD or pericardial effusion. The LV cavity was small The quantitative EF was 58% (ESV 76 cc EDV 32 cc SV 44 cc) The aortic valve was tri leaflet and normal. The tricuspid valve was normal with mild TR. There was moderate appearing central MR. Delayed enhancement images with gadolinium showed no infiltration, scar or evidence  of sarcoid heart disease. Note the patient's HR was very erratic and most of the images were done with free breathing IMPRESSION: 1) No delayed gadolinium uptake on inversion recovery sequences No evidence of sarcoid heart disease 2) Moderate LAE and mild RAE 3) Moderate MR 4) Mild TR 5) Normal LV quantitative EF 58% Jenkins Rouge Electronically Signed   By: Jenkins Rouge M.D.   On: 08/12/2016 12:06    Assessment & Plan:   Diagnoses and all orders for this visit:  Essential hypertension  B12 deficiency  Hyperglycemia  Hypokalemia  Sarcoidosis   I am having Ms. Jankowiak maintain her glucose blood, ONE TOUCH LANCETS, dorzolamide-timolol, vitamin B-12, Omega-3, loratadine, Probiotic Product (PROBIOTIC DAILY PO), acyclovir, hydroxypropyl methylcellulose / hypromellose, losartan, amLODipine, nebivolol, ELIQUIS, predniSONE, and potassium bicarbonate.  No orders of the defined types were placed  in this encounter.    Follow-up: No Follow-up on file.  Walker Kehr, MD

## 2016-09-05 NOTE — Telephone Encounter (Signed)
noted 

## 2016-09-10 ENCOUNTER — Telehealth: Payer: Self-pay | Admitting: Pulmonary Disease

## 2016-09-10 ENCOUNTER — Other Ambulatory Visit: Payer: Self-pay | Admitting: Pulmonary Disease

## 2016-09-10 NOTE — Telephone Encounter (Signed)
Left message for patient to call back  

## 2016-09-11 ENCOUNTER — Encounter: Payer: Self-pay | Admitting: Pulmonary Disease

## 2016-09-12 ENCOUNTER — Encounter: Payer: Self-pay | Admitting: Pulmonary Disease

## 2016-09-12 ENCOUNTER — Telehealth: Payer: Self-pay | Admitting: Pulmonary Disease

## 2016-09-12 NOTE — Telephone Encounter (Signed)
Per duplicate message taken 09/12/16 : Contacts    Type Contact Phone  09/12/2016 09:37 AM Phone (Incoming) [place], [place] V (Self) [phone] (H)  Pt returning call.  She can be reached after 2:15 or if you can leave her a detailed message on her phone.   There is also an e-mail from patient dated 8.29.18 asking this same question so will sign off on both phone notes

## 2016-09-12 NOTE — Telephone Encounter (Addendum)
8.29.18 e-mail from patient: Message   Dr. Lake Bells, I attempted to schedule a recent appointment with you. Earliest available appointment is 10/31/16. Meanwhile, I would like permission to decrease Prednisone to 5 mg instead of 10mg  until I see you in October. During our last appointment, we discussed discontinuing Prednisone and making/considering future medication changes at our next appt.  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 I am on my feet more now and still experience some foot and ankle swelling due to the Prednisone 10mg  (seldom swelling issues with this prior to starting Prednisone). I would prefer not to take any new or additional medications to relieve the foot and ankle swelling.     Your permission to decrease to 5mg  Prednisone until I see you again in October would be much appreciated. I feel much better and previous symptoms of Sarcoidosis have greatly improved.  [name] (09/11/16)  Patient   E-mail sent to patient regarding her message  Will forward to BQ for recs about decreasing her prednisone  Per the 8.2.18 office visit with BQ: Patient Instructions  For your sarcoidosis: Continue taking prendisone as you are doing Once you decrease to dose to 10mg  daily stay on that dose until you see me next We will repeat a CT scan of your Chest in December 2018   For the liver lesion: We will change the date of the MRI liver to December 2018   I am going to reach out to Dr. Dorothyann Peng to help coordinate your care   We will see you back in 3 months or sooner if needed

## 2016-09-12 NOTE — Telephone Encounter (Signed)
There is an 8.28.18 phone note and 8.29.18 e-mail regarding pt's question about decreasing her prednisone Will close this phone note and document/respond to patient via email

## 2016-09-12 NOTE — Telephone Encounter (Signed)
See 8/29 email- this issue has been handled.  Will close encounter.

## 2016-09-12 NOTE — Telephone Encounter (Signed)
lmomtcb x 2 for the pt.  

## 2016-09-18 NOTE — Progress Notes (Signed)
HPI: FU atrial flutter. Patient with history of atrial flutter on electrocardiogram. Echocardiogram January 2018 showed normal LV function and mild to moderate mitral regurgitation. Pt seen by Dr Curt Bears and flutter felt atypical; ablation not recommended. Pt started on flecanide and scheduled for DCCV but was in sinus at presentation for procedure. Exercise treadmill March 2018 showed no chest pain, no ST changes and no exercise-induced ventricular tachycardia. Chest CT June 2018 showed progression of pulmonary sarcoidosis. There was also note of a 4.2 cm thoracic aortic aneurysm. Cardiac MRI July 2018 showed no evidence of sarcoid, ejection fraction 58%, moderate left atrial enlargement, mild right atrial enlargement, moderate mitral regurgitation and mild tricuspid regurgitation. Patient's amlodipine previously decreased because of pedal edema. She discontinued her flecainide on her own because of increased palpitations. Since last seen, patient has some dyspnea on exertion but denies orthopnea, PND or pedal edema. No chest pain, palpitations or syncope.  Current Outpatient Prescriptions  Medication Sig Dispense Refill  . acyclovir (ZOVIRAX) 200 MG capsule Take 200 mg by mouth daily.     [name] amLODipine (NORVASC) 5 MG tablet Take 2.5 mg by mouth daily.  3  . apixaban (ELIQUIS) 5 MG TABS tablet Take 1 tablet (5 mg total) by mouth 2 (two) times daily. 60 tablet 3  . BIOTIN PO Take by mouth daily.    . Biotin w/ Vitamins C & E (HAIR/SKIN/NAILS PO) Take by mouth daily.    . dorzolamide-timolol (COSOPT) 22.3-6.8 MG/ML ophthalmic solution Place 1 drop into the left eye daily.   12  . glucose blood test strip 1 each by Other route as directed. 100 each 3  . hydroxypropyl methylcellulose / hypromellose (ISOPTO TEARS / GONIOVISC) 2.5 % ophthalmic solution Place 1 drop into the right eye daily.    [name] loratadine (CLARITIN) 10 MG tablet Take 10 mg by mouth daily.    [name] losartan (COZAAR) 100 MG tablet Take 1  tablet (100 mg total) by mouth daily. Annual appt w/labs due in Oct must see MD for reills 90 tablet 0  . nebivolol (BYSTOLIC) 5 MG tablet Take 1 tablet (5 mg total) by mouth every evening. 90 tablet 3  . Omega-3 300 MG CAPS Take 2 capsules by mouth daily.     . ONE TOUCH LANCETS MISC 1 each by Does not apply route as directed. 100 each 3  . potassium bicarbonate (KLOR-CON/EF) 25 MEQ disintegrating tablet Take 1 tablet (25 mEq total) by mouth 2 (two) times daily. Annual appt due in Oct w/labs must see provider for future refills 60 tablet 1  . predniSONE (DELTASONE) 5 MG tablet Take 5 mg by mouth daily.    . Probiotic Product (PROBIOTIC DAILY PO) Take 1 each by mouth daily.    . vitamin B-12 (CYANOCOBALAMIN) 100 MCG tablet Take 100 mcg by mouth once a week. Reported on 06/20/2015     No current facility-administered medications for this visit.      Past Medical History:  Diagnosis Date  . AAA (abdominal aortic aneurysm) (HCC)    a. 4.2 cm by CT in 06/2016  . Allergy   . Anxiety   . Asthma   . Atrial flutter (HCC)    a. on Flecainide and Eliquis  . Cataract   . Depression    pt states no depression 07-11-15  . Glaucoma   . Heart murmur    as child only  . Herpes simplex   . Hypertension   . Multiple thyroid nodules   .  Osteoarthritis   . Osteoporosis   . PUD (peptic ulcer disease)   . Pulmonary sarcoidosis ([place])   . Tubular adenoma of colon 07/2015  . Vitamin D deficiency     Past Surgical History:  Procedure Laterality Date  . BUNIONECTOMY     x2  . CATARACT EXTRACTION Right   . DENTAL SURGERY    . DILATION AND CURETTAGE OF UTERUS    . fibroid tumor removal    . GLAUCOMA SURGERY     Right eye  . MYOMECTOMY      Social History   Social History  . Marital status: Divorced    Spouse name: N/A  . Number of children: N/A  . Years of education: N/A   Occupational History  . Not on file.   Social History Main Topics  . Smoking status: Never Smoker  . Smokeless  tobacco: Never Used  . Alcohol use No  . Drug use: No  . Sexual activity: No   Other Topics Concern  . Not on file   Social History Narrative   Single/divorced; no children; she has a friend now   Never smoked   Positive history of passive tobacco smoke exposure.   Exercise- up to 4 times a week   Caffeine- 1-2 per week    Family History  Problem Relation Age of Onset  . Lung cancer Mother        sarcoid  . Stroke Father   . Hypertension Father   . Heart Problems Father   . Stomach cancer Cousin        3rd cousin   . Colon cancer Neg Hx   . Colon polyps Neg Hx   . Esophageal cancer Neg Hx   . Rectal cancer Neg Hx     ROS: no fevers or chills, productive cough, hemoptysis, dysphasia, odynophagia, melena, hematochezia, dysuria, hematuria, rash, seizure activity, orthopnea, PND, pedal edema, claudication. Remaining systems are negative.  Physical Exam: Well-developed well-nourished in no acute distress.  Skin is warm and dry.  HEENT is normal.  Neck is supple.  Chest is clear to auscultation with normal expansion.  Cardiovascular exam is regular rate and rhythm.  Abdominal exam nontender or distended. No masses palpated. Extremities show no edema. neuro grossly intact  ECG- Atrial flutter with PVCs or aberrantly conducted beats. No ST changes. personally reviewed  A/P  1 Atrial flutter- patient discontinued flecainide on her own. She is back in atrial flutter but is asymptomatic. I think rate control and anticoagulation is appropriate. Increase bysystolic to 7.5 mg daily. Continue apixaban. Check hemoglobin and renal function. In one week check Holter monitor to make sure that rate is adequately controlled.   2 hypertension-blood pressure is controlled. I am increasing bysystolic for rate control. Discontinue amlodipine and follow blood pressure.   3 history of asthma-managed by primary care.  4 thoracic aortic aneurysm-she will need a follow-up CTA June 2019.  5  sarcoidosis-management per pulmonary. Previous MRI did not show cardiac involvement.  Kirk Ruths, MD

## 2016-09-25 ENCOUNTER — Encounter: Payer: Self-pay | Admitting: Cardiology

## 2016-09-25 ENCOUNTER — Telehealth: Payer: Self-pay | Admitting: Cardiology

## 2016-09-25 ENCOUNTER — Ambulatory Visit (INDEPENDENT_AMBULATORY_CARE_PROVIDER_SITE_OTHER): Payer: BLUE CROSS/BLUE SHIELD | Admitting: Cardiology

## 2016-09-25 VITALS — BP 125/90 | HR 96 | Ht 60.0 in | Wt 121.0 lb

## 2016-09-25 DIAGNOSIS — I712 Thoracic aortic aneurysm, without rupture, unspecified: Secondary | ICD-10-CM

## 2016-09-25 DIAGNOSIS — I484 Atypical atrial flutter: Secondary | ICD-10-CM | POA: Diagnosis not present

## 2016-09-25 DIAGNOSIS — I10 Essential (primary) hypertension: Secondary | ICD-10-CM | POA: Diagnosis not present

## 2016-09-25 MED ORDER — NEBIVOLOL HCL 5 MG PO TABS: 7.5000 mg | ORAL_TABLET | Freq: Every evening | ORAL | 3 refills | Status: DC

## 2016-09-25 NOTE — Patient Instructions (Signed)
Medication Instructions:   STOP AMLODIPINE  INCREASE BYSTOLIC TO 7.5 MG ONCE DAILY= 1 AND 1/2 OF THE 5 MG TABLETS ONCE DAILY  Labwork:  Your physician recommends that you HAVE LAB WORK TODAY  Testing/Procedures:  Your physician has recommended that you wear a 24 HOUR holter monitor. Holter monitors are medical devices that record the heart's electrical activity. Doctors most often use these monitors to diagnose arrhythmias. Arrhythmias are problems with the speed or rhythm of the heartbeat. The monitor is a small, portable device. You can wear one while you do your normal daily activities. This is usually used to diagnose what is causing palpitations/syncope (passing out).    Follow-Up:  Your physician recommends that you schedule a follow-up appointment in: [place]   If you need a refill on your cardiac medications before your next appointment, please call your pharmacy.

## 2016-09-25 NOTE — Telephone Encounter (Signed)
New Message  Patient calling the office for samples of medication:   1.  What medication and dosage are you requesting samples for? Bystolic 7 1/2  2.  Are you currently out of this medication? Per pt changed the mg for medication. Pt states insurance will not pay for medication .please call back to discuss

## 2016-09-25 NOTE — Telephone Encounter (Signed)
Medication samples have been provided to the patient.  Drug name: Bystolic 5 mg Qty: 21 tabs LOT: [id number] Exp.Date: 12/2018  Samples left at front desk for patient pick-up. Patient notified.

## 2016-09-26 LAB — BASIC METABOLIC PANEL
BUN/Creatinine Ratio: 13 (ref 9–23)
BUN: 18 mg/dL (ref 6–24)
CHLORIDE: 102 mmol/L (ref 96–106)
CO2: 29 mmol/L (ref 20–29)
Calcium: 10.1 mg/dL (ref 8.7–10.2)
Creatinine, Ser: 1.38 mg/dL — ABNORMAL HIGH (ref 0.57–1.00)
GFR, EST AFRICAN AMERICAN: 49 mL/min/{1.73_m2} — AB (ref >59)
GFR, EST NON AFRICAN AMERICAN: 42 mL/min/{1.73_m2} — AB (ref >59)
Glucose: 76 mg/dL (ref 65–99)
POTASSIUM: 4.3 mmol/L (ref 3.5–5.2)
Sodium: 144 mmol/L (ref 134–144)

## 2016-09-26 LAB — CBC
HEMOGLOBIN: 15.9 g/dL (ref 11.1–15.9)
Hematocrit: 47.3 % — ABNORMAL HIGH (ref 34.0–46.6)
MCH: 31.4 pg (ref 26.6–33.0)
MCHC: 33.6 g/dL (ref 31.5–35.7)
MCV: 94 fL (ref 79–97)
PLATELETS: 267 10*3/uL (ref 150–379)
RBC: 5.06 x10E6/uL (ref 3.77–5.28)
RDW: 15.2 % (ref 12.3–15.4)
WBC: 7.3 10*3/uL (ref 3.4–10.8)

## 2016-09-27 ENCOUNTER — Encounter: Payer: Self-pay | Admitting: Cardiology

## 2016-10-10 ENCOUNTER — Other Ambulatory Visit: Payer: Self-pay | Admitting: Internal Medicine

## 2016-10-14 ENCOUNTER — Ambulatory Visit (INDEPENDENT_AMBULATORY_CARE_PROVIDER_SITE_OTHER): Payer: BLUE CROSS/BLUE SHIELD

## 2016-10-14 DIAGNOSIS — I484 Atypical atrial flutter: Secondary | ICD-10-CM

## 2016-10-15 ENCOUNTER — Other Ambulatory Visit: Payer: Self-pay | Admitting: Pulmonary Disease

## 2016-10-15 ENCOUNTER — Telehealth: Payer: Self-pay | Admitting: Cardiology

## 2016-10-15 NOTE — Telephone Encounter (Signed)
Patient calling the office for samples of medication:   1.  What medication and dosage are you requesting samples for? diastolic 5mg   Or  7.5mg   2.  Are you currently out of this medication? Pt has almost a week left

## 2016-10-16 ENCOUNTER — Ambulatory Visit: Payer: BLUE CROSS/BLUE SHIELD | Admitting: Cardiology

## 2016-10-16 NOTE — Telephone Encounter (Signed)
Spoke w patient. She had requested samples - came in as walk-in and stated she called earlier today. Samples of bystolic supplied for patient. She notes she can't get this medication at pharmacy right now - trying to get filled at [facility] in [place].  I called retail pharmacy, [facility] Kaufmann is needed. They are faxing ppw to our office clinical fax.

## 2016-10-16 NOTE — Telephone Encounter (Signed)
°  Patient calling the office for samples of medication:   1.  What medication and dosage are you requesting samples for? Bystolic 7.5 mg  2.  Are you currently out of this medication?   Has 4 days left

## 2016-10-16 NOTE — Telephone Encounter (Signed)
Left message for patient, no samples available in the [place] office today. Will check in [place] tomorrow and let her know

## 2016-10-23 ENCOUNTER — Other Ambulatory Visit: Payer: Self-pay | Admitting: Internal Medicine

## 2016-10-23 ENCOUNTER — Ambulatory Visit (INDEPENDENT_AMBULATORY_CARE_PROVIDER_SITE_OTHER): Payer: BLUE CROSS/BLUE SHIELD | Admitting: Cardiology

## 2016-10-23 ENCOUNTER — Encounter: Payer: Self-pay | Admitting: Cardiology

## 2016-10-23 VITALS — BP 156/104 | HR 86 | Ht 60.0 in | Wt 125.8 lb

## 2016-10-23 DIAGNOSIS — Z79899 Other long term (current) drug therapy: Secondary | ICD-10-CM | POA: Diagnosis not present

## 2016-10-23 DIAGNOSIS — I10 Essential (primary) hypertension: Secondary | ICD-10-CM

## 2016-10-23 DIAGNOSIS — I484 Atypical atrial flutter: Secondary | ICD-10-CM

## 2016-10-23 MED ORDER — PROPAFENONE HCL 225 MG PO TABS: 225.0000 mg | ORAL_TABLET | Freq: Two times a day (BID) | ORAL | 3 refills | Status: DC

## 2016-10-23 NOTE — Progress Notes (Signed)
Electrophysiology Office Note   Date:  10/23/2016   ID:  [name], DOB 1960/06/18, MRN [id number]  PCP:  Cassandria Anger, MD  Cardiologist:  Stanford Breed Primary Electrophysiologist:  Will Meredith Leeds, MD    Chief Complaint  Patient presents with  . Follow-up    1st degree AV block/PVC's-PAC's-SVT     History of Present Illness: [name] is a 56 y.o. female who presents today for electrophysiology evaluation.   She has a history of atrial flutter, depression, hypertension has had intermittent dyspnea on exertion since 2016. She does have occasional palpitations.She says that she also has low blood pressures at times and feels weak and fatigued. She started her Eliquis on January 10. She was initially tried on flecainide, which converted her to sinus rhythm but she noted palpitations at night and stop the drug on her own. Since then, she has gone back into atrial flutter. She does have some exertional shortness of breath and some fatigue that she feels might be due to her bystolic.   Today, denies symptoms of palpitations, chest pain, shortness of breath, orthopnea, PND, lower extremity edema, claudication, dizziness, presyncope, syncope, bleeding, or neurologic sequela. The patient is tolerating medications without difficulties.     Past Medical History:  Diagnosis Date  . AAA (abdominal aortic aneurysm) (HCC)    a. 4.2 cm by CT in 06/2016  . Allergy   . Anxiety   . Asthma   . Atrial flutter (HCC)    a. on Flecainide and Eliquis  . Cataract   . Depression    pt states no depression 07-11-15  . Glaucoma   . Heart murmur    as child only  . Herpes simplex   . Hypertension   . Multiple thyroid nodules   . Osteoarthritis   . Osteoporosis   . PUD (peptic ulcer disease)   . Pulmonary sarcoidosis ([place])   . Tubular adenoma of colon 07/2015  . Vitamin D deficiency    Past Surgical History:  Procedure Laterality Date  . BUNIONECTOMY     x2  . CATARACT  EXTRACTION Right   . DENTAL SURGERY    . DILATION AND CURETTAGE OF UTERUS    . fibroid tumor removal    . GLAUCOMA SURGERY     Right eye  . MYOMECTOMY       Current Outpatient Prescriptions  Medication Sig Dispense Refill  . acyclovir (ZOVIRAX) 200 MG capsule Take 200 mg by mouth daily.     [name] apixaban (ELIQUIS) 5 MG TABS tablet Take 1 tablet (5 mg total) by mouth 2 (two) times daily. 60 tablet 3  . BIOTIN PO Take by mouth daily.    . Biotin w/ Vitamins C & E (HAIR/SKIN/NAILS PO) Take by mouth daily.    . dorzolamide-timolol (COSOPT) 22.3-6.8 MG/ML ophthalmic solution Place 1 drop into the left eye daily.   12  . glucose blood test strip 1 each by Other route as directed. 100 each 3  . hydroxypropyl methylcellulose / hypromellose (ISOPTO TEARS / GONIOVISC) 2.5 % ophthalmic solution Place 1 drop into the right eye daily.    [name] loratadine (CLARITIN) 10 MG tablet Take 10 mg by mouth daily.    [name] losartan (COZAAR) 100 MG tablet Take 1 tablet (100 mg total) by mouth daily. Must keep appt on 11/29/16 90 tablet 0  . nebivolol (BYSTOLIC) 5 MG tablet Take 1.5 tablets (7.5 mg total) by mouth every evening. 45 tablet 3  . Omega-3  300 MG CAPS Take 2 capsules by mouth daily.     . ONE TOUCH LANCETS MISC 1 each by Does not apply route as directed. 100 each 3  . potassium bicarbonate (KLOR-CON/EF) 25 MEQ disintegrating tablet Take 1 tablet (25 mEq total) by mouth 2 (two) times daily. Annual appt due in Oct w/labs must see provider for future refills 60 tablet 1  . predniSONE (DELTASONE) 5 MG tablet Take 5 mg by mouth daily.    . Probiotic Product (PROBIOTIC DAILY PO) Take 1 each by mouth daily.    . vitamin B-12 (CYANOCOBALAMIN) 100 MCG tablet Take 100 mcg by mouth once a week. Reported on 06/20/2015    . propafenone (RYTHMOL) 225 MG tablet Take 1 tablet (225 mg total) by mouth 2 (two) times daily. 60 tablet 3   No current facility-administered medications for this visit.     Allergies:    Amoxicillin-pot clavulanate; Amoxicillin-pot clavulanate; Cardizem [diltiazem hcl]; Carvedilol; Cefdinir; Cephalexin; Hydrochlorothiazide; Influenza vaccines; Prednisone; Sulfamethoxazole; Sulfasalazine; Sulfonamide derivatives; and Verapamil   Social History:  The patient  reports that she has never smoked. She has never used smokeless tobacco. She reports that she does not drink alcohol or use drugs.   Family History:  The patient's family history includes Heart Problems in her father; Hypertension in her father; Lung cancer in her mother; Stomach cancer in her cousin; Stroke in her father.    ROS:  Please see the history of present illness.   Otherwise, review of systems is positive for leg swelling, palpitations, cough, dyspnea on exertion, easy bruising.   All other systems are reviewed and negative.   PHYSICAL EXAM: VS:  BP (!) 156/104   Pulse 86   Ht 5' (1.524 m)   Wt 125 lb 12.8 oz (57.1 kg)   SpO2 94%   BMI 24.57 kg/m  , BMI Body mass index is 24.57 kg/m. GEN: Well nourished, well developed, in no acute distress  HEENT: normal  Neck: no JVD, carotid bruits, or masses Cardiac: RRR; no murmurs, rubs, or gallops,no edema  Respiratory:  clear to auscultation bilaterally, normal work of breathing GI: soft, nontender, nondistended, + BS MS: no deformity or atrophy  Skin: warm and dry Neuro:  Strength and sensation are intact Psych: euthymic mood, full affect  EKG:  EKG is ordered today. Personal review of the ekg ordered shows sinus rhythm, biatrial enlargement, right axis, rate 75  Recent Labs: 05/09/2016: TSH 2.73 08/08/2016: ALT 61 09/25/2016: BUN 18; Creatinine, Ser 1.38; Hemoglobin 15.9; Platelets 267; Potassium 4.3; Sodium 144    Lipid Panel     Component Value Date/Time   CHOL 150 01/25/2013 0840   TRIG 55.0 01/25/2013 0840   TRIG 86 01/02/2006 0927   HDL 38.90 (L) 01/25/2013 0840   CHOLHDL 4 01/25/2013 0840   VLDL 11.0 01/25/2013 0840   LDLCALC 100 (H)  01/25/2013 0840     Wt Readings from Last 3 Encounters:  10/23/16 125 lb 12.8 oz (57.1 kg)  09/25/16 121 lb (54.9 kg)  09/04/16 121 lb (54.9 kg)      Other studies Reviewed: Additional studies/ records that were reviewed today include: TTE 01/24/16  Review of the above records today demonstrates:  - Left ventricle: The cavity size was normal. Wall thickness was   normal. Systolic function was normal. The estimated ejection   fraction was in the range of 55% to 60%. Wall motion was normal;   there were no regional wall motion abnormalities. - Mitral valve: There  was mild to moderate regurgitation.  Holter 10/14/16 - personally reviewed Sinus with first degree AV block, pacs, pvcs, couplet, NSVT (longest 5 beats) and atrial flutter with RVR.  ASSESSMENT AND PLAN:  1.  Atypical atrial flutter: Has been in atrial flutter for quite some time. She did convert to sinus rhythm with flecainide but had side effects. We'll plan to start her today on propafenone, which may help to improve her symptoms of shortness of breath with exertion and fatigue.  This patients CHA2DS2-VASc Score and unadjusted Ischemic Stroke Rate (% per year) is equal to 3.2 % stroke rate/year from a score of 3  Above score calculated as 1 point each if present [CHF, HTN, DM, Vascular=MI/PAD/Aortic Plaque, Age if 65-74, or Female] Above score calculated as 2 points each if present [Age > 75, or Stroke/TIA/TE]   2. Hypertension: Blood pressure is elevated today but has been normal in the past. We'll make no changes at this time and told her to continue to monitor.  Current medicines are reviewed at length with the patient today.   The patient does not have concerns regarding her medicines.  The following changes were made today:  Start propafenone  Labs/ tests ordered today include:  Orders Placed This Encounter  Procedures  . Exercise Tolerance Test  . EKG 12-Lead     Disposition:   FU with Will Camnitz 3  month  Signed, Will Meredith Leeds, MD  10/23/2016 4:44 PM     [address] [place] [address] [phone] (office) [phone] (fax)

## 2016-10-23 NOTE — Patient Instructions (Addendum)
Medication Instructions:  Your physician has recommended you make the following change in your medication:  1. START PROPAFENONE 225 twice a day ---  YOU WILL START THIS MEDICATION 7-10 DAYS PRIOR TO STRESS TESTING  Labwork: None ordered  Testing/Procedures: Your physician has requested that you have an exercise tolerance test - THIS NEEDS TO BE SCHEDULED AT LEAST 10 DAYS FROM NOW. For further information please visit [email or web address]. Please also follow instruction sheet, as given.  Follow-Up: Your physician recommends that you schedule a follow-up appointment in: 3 months with Dr. Curt Bears.   -- If you need a refill on your cardiac medications before your next appointment, please call your pharmacy. --  Thank you for choosing [facility]!!   Trinidad Curet, RN [phone]  Any Other Special Instructions Will Be Listed Below (If Applicable).  Propafenone tablets What is this medicine? PROPAFENONE (proe pa FEEN one) is an antiarrhythmic agent. It is used to treat irregular heart rhythm and can slow rapid heartbeats. This medicine can help your heart to return to and maintain a normal rhythm. This medicine may be used for other purposes; ask your health care provider or pharmacist if you have questions. COMMON BRAND NAME(S): Rythmol What should I tell my health care provider before I take this medicine? They need to know if you have any of these conditions: -heart disease -high blood levels of potassium -kidney disease -liver disease -low blood pressure -lung disease like asthma, chronic bronchitis or emphysema -pacemaker -slow heart rate -an unusual or allergic reaction to propafenone, other medicines, foods, dyes, or preservatives -pregnant or trying to get pregnant -breast-feeding How should I use this medicine? Take this medicine by mouth with a glass of water. Follow the directions on the prescription label. You can take this medicine with or without food. Take  your doses at regular intervals. Do not take your medicine more often than directed. Do not stop taking except on the advice of your doctor or health care professional. Talk to your pediatrician regarding the use of this medicine in children. Special care may be needed. Overdosage: If you think you have taken too much of this medicine contact a poison control center or emergency room at once. NOTE: This medicine is only for you. Do not share this medicine with others. What if I miss a dose? If you miss a dose, take it as soon as you can. If it is almost time for your next dose, take only that dose. Do not take double or extra doses. What may interact with this medicine? Do not take this medicine with any of the following medications: -arsenic trioxide -certain antibiotics like clarithromycin, erythromycin, grepafloxacin, pentamidine, sparfloxacin, troleandomycin -certain medicines for depression or mental illness like amoxapine, haloperidol, maprotiline, pimozide, sertindole, thioridazine, tricyclic antidepressants, ziprasidone -certain medicines for fungal infections like fluconazole, itraconazole, ketoconazole, posaconazole, voriconazole -certain medicines for irregular heart beat like dofetilide, dronedarone -certain medicines for malaria like chloroquine, halofantrine -cisapride -droperidol -levomethadyl -ranolazine -ritonavir This medicine may also interact with the following medications: -certain medicines for angina or blood pressure -certain medicines for asthma or breathing difficulties like formoterol, salmeterol -certain medicines that treat or prevent blood clots like warfarin -cimetidine -cyclosporine -digoxin -diuretics -local anesthetics -other medicines that prolong the QT interval (cause an abnormal heart rhythm) -rifampin -theophylline This list may not describe all possible interactions. Give your health care provider a list of all the medicines, herbs,  non-prescription drugs, or dietary supplements you use. Also tell them if you smoke, drink alcohol,  or use illegal drugs. Some items may interact with your medicine. What should I watch for while using this medicine? Your condition will be monitored closely when you first begin therapy. Often, this drug is first started in a hospital or other monitored health care setting. Once you are on maintenance therapy, visit your doctor or health care professional for regular checks on your progress. Because your condition and use of this medicine carry some risk, it is a good idea to carry an identification card, necklace or bracelet with details of your condition, medications, and doctor or health care professional. Dennis Bast may get drowsy or dizzy. Do not drive, use machinery, or do anything that needs mental alertness until you know how this medicine affects you. Do not stand or sit up quickly, especially if you are an older patient. This reduces the risk of dizzy or fainting spells. If you are going to have surgery, tell your doctor or health care professional that you are taking this medicine. What side effects may I notice from receiving this medicine? Side effects that you should report to your doctor or health care professional as soon as possible: -chest pain, palpitations -fever or chills -shortness of breath -swelling of feet or legs -trembling or shaking Side effects that usually do not require medical attention (report to your doctor or health care professional if they continue or are bothersome): -blurred vision -changes in taste (a metallic or bitter taste) -constipation or diarrhea -dry mouth -headache -nausea or vomiting -tiredness or weakness This list may not describe all possible side effects. Call your doctor for medical advice about side effects. You may report side effects to FDA at 1-800-FDA-1088. Where should I keep my medicine? Keep out of the reach of children. Store at room  temperature between 15 and 30 degrees C (59 and 86 degrees F). Protect from light. Keep container tightly closed. Throw away any unused medicine after the expiration date. NOTE: This sheet is a summary. It may not cover all possible information. If you have questions about this medicine, talk to your doctor, pharmacist, or health care provider.  2018 Elsevier/Gold Standard (2012-07-27 13:27:06)

## 2016-10-31 ENCOUNTER — Ambulatory Visit (INDEPENDENT_AMBULATORY_CARE_PROVIDER_SITE_OTHER): Payer: BLUE CROSS/BLUE SHIELD | Admitting: Pulmonary Disease

## 2016-10-31 ENCOUNTER — Encounter: Payer: Self-pay | Admitting: Pulmonary Disease

## 2016-10-31 VITALS — BP 128/66 | HR 81 | Ht 60.0 in | Wt 125.6 lb

## 2016-10-31 DIAGNOSIS — D869 Sarcoidosis, unspecified: Secondary | ICD-10-CM | POA: Diagnosis not present

## 2016-10-31 DIAGNOSIS — K769 Liver disease, unspecified: Secondary | ICD-10-CM | POA: Diagnosis not present

## 2016-10-31 NOTE — Patient Instructions (Signed)
For sarcoidosis with pulmonary and liver lesion: Continue prednisone 5 mg daily We will repeat the MRI of your liver and CT of your chest in December and then see you after that We will likely stop prednisone in December  We will see you back in December

## 2016-10-31 NOTE — Progress Notes (Signed)
Subjective:    Patient ID: [name], female    DOB: 1960-05-08, 56 y.o.   MRN: [id number]  Synopsis: Former patient of Dr. Joya Moore who has sarcoidosis. She also has asthma. She was diagnosed with a skin biopsy in 1983 when she had a rash.  The disease had been relatively quiet for years but in 2018 she developed a large liver mass which was noted to be full of non-caseating granulomas on biopsy.  A HRCT performed in 2018 showed worsening consolidative changes in her lungs bilaterally consistent with sarcoidosis.  She started taking prednisone on June 30.   HPI  Chief Complaint  Patient presents with  . Follow-up    pt states she is doing well, does note some sob with moderate exertion.     [name] is doing well right now. No breathing difficulty right now other than a little rare dyspnea on exertion. Her belly is doing OK.  She has some gas from time to time.  She hasn't been on her probiotic lately because she lost power and it wasn't refrigerated.   She continues to take prednisone, 5mg  daily No other respiratory problems, no cough.     Past Medical History:  Diagnosis Date  . AAA (abdominal aortic aneurysm) (HCC)    a. 4.2 cm by CT in 06/2016  . Allergy   . Anxiety   . Asthma   . Atrial flutter (HCC)    a. on Flecainide and Eliquis  . Cataract   . Depression    pt states no depression 07-11-15  . Glaucoma   . Heart murmur    as child only  . Herpes simplex   . Hypertension   . Multiple thyroid nodules   . Osteoarthritis   . Osteoporosis   . PUD (peptic ulcer disease)   . Pulmonary sarcoidosis ([place])   . Tubular adenoma of colon 07/2015  . Vitamin D deficiency      Review of Systems  Constitutional: Negative for chills, fatigue and fever.  HENT: Negative for nosebleeds, postnasal drip, rhinorrhea, sinus pressure and sneezing.   Respiratory: Negative for cough, shortness of breath and wheezing.   Cardiovascular: Negative for chest pain, palpitations and leg  swelling.       Objective:   Physical Exam Vitals:   10/31/16 1528  BP: 128/66  Pulse: 81  SpO2: 98%  Weight: 125 lb 9.6 oz (57 kg)  Height: 5' (1.524 m)   RA  Gen: well appearing HENT: OP clear, TM's clear, neck supple PULM: CTA B, normal percussion CV: RRR, no mgr, trace edema GI: BS+, soft, nontender Derm: no cyanosis or rash Psyche: normal mood and affect    Imaging: CXR images from 2016 reviewed: There is significant upper lobe scarring which radiology feels has progressed compared to the prior study 06/2016 HRCT> significant interval worsening of pulmonary parenchymal abnormality, mediastinal lymphadenopathy worse; also with ectactic aorta, suggestion of pulmonary hypertension 07/2016 Cardiac MRI: no evidence of sarcoidosis, mild LAE and RAE, LVEF 58%  PFT: June 2014 pulmonary function testing ratio 77%, FEV1 1.99 L, 104% predicted), FVC 2.60 L, 108% predicted, total lung capacity 3.75 L (84% predicted), DLCO 20.0 (106% predicted)  Pathology: May 2018 liver biopsy showed noncaseating granulomas, special stains were negative for organisms.  Records from her last visit with her primary care physician and may reviewed were she requested a referral to [facility] for further evaluation of sarcoidosis in the liver. Records revisit in May 2018 with GI also reviewed were  she was noted to have an abnormal echotexture on liver ultrasound and then eventually an MRI which showed a distinct lesion in her liver which was worrisome for malignancy.  A biopsy was performed which showed sarcoidosis, see above.     Assessment & Plan:  Sarcoidosis  Liver lesion  Discussion: This has been a stable interval for her. She is tolerating prednisone well. As stated in the previous visit we plan to treat with prednisone 12/2016 and then repeat chest and liver imaging. If things look okay at that point then we will plan to stop prednisone.   Plan: For sarcoidosis with pulmonary and liver  lesion: Continue prednisone 5 mg daily We will repeat the MRI of your liver and CT of your chest in December and then see you after that We will likely stop prednisone in December  We will see you back in December   Current Outpatient Prescriptions:  .  acyclovir (ZOVIRAX) 200 MG capsule, Take 200 mg by mouth every other day. , Disp: , Rfl:  .  apixaban (ELIQUIS) 5 MG TABS tablet, Take 1 tablet (5 mg total) by mouth 2 (two) times daily., Disp: 60 tablet, Rfl: 3 .  BIOTIN PO, Take by mouth daily., Disp: , Rfl:  .  dorzolamide-timolol (COSOPT) 22.3-6.8 MG/ML ophthalmic solution, Place 1 drop into the left eye daily. , Disp: , Rfl: 12 .  glucose blood test strip, 1 each by Other route as directed., Disp: 100 each, Rfl: 3 .  hydroxypropyl methylcellulose / hypromellose (ISOPTO TEARS / GONIOVISC) 2.5 % ophthalmic solution, Place 1 drop into the right eye daily., Disp: , Rfl:  .  loratadine (CLARITIN) 10 MG tablet, Take 10 mg by mouth daily., Disp: , Rfl:  .  losartan (COZAAR) 100 MG tablet, Take 1 tablet (100 mg total) by mouth daily. Must keep appt on 11/29/16, Disp: 90 tablet, Rfl: 0 .  nebivolol (BYSTOLIC) 5 MG tablet, Take 7.5 mg by mouth daily., Disp: , Rfl:  .  Omega-3 300 MG CAPS, Take 2 capsules by mouth daily. , Disp: , Rfl:  .  ONE TOUCH LANCETS MISC, 1 each by Does not apply route as directed., Disp: 100 each, Rfl: 3 .  potassium bicarbonate (KLOR-CON/EF) 25 MEQ disintegrating tablet, Take 1 tablet (25 mEq total) by mouth 2 (two) times daily., Disp: 60 tablet, Rfl: 1 .  predniSONE (DELTASONE) 5 MG tablet, Take 5 mg by mouth daily., Disp: , Rfl:  .  Probiotic Product (PROBIOTIC DAILY PO), Take 1 each by mouth daily., Disp: , Rfl:  .  vitamin B-12 (CYANOCOBALAMIN) 100 MCG tablet, Take 100 mcg by mouth daily. Reported on 06/20/2015, Disp: , Rfl:  .  propafenone (RYTHMOL) 225 MG tablet, Take 1 tablet (225 mg total) by mouth 2 (two) times daily. (Patient not taking: Reported on 10/31/2016),  Disp: 60 tablet, Rfl: 3

## 2016-11-06 ENCOUNTER — Telehealth: Payer: Self-pay | Admitting: Cardiology

## 2016-11-06 NOTE — Telephone Encounter (Signed)
Pt reports that she has not started Propafenone.   States she does not want to start it at this time d/t possible SE.   Says that she is feeling better and not having current issues w/ Atrial FLutter.  Pt confirms she knows when she is/isn't in Atrial FLutter. Confirmed that pt is still taking her Eliquis BID and re-educated of importance of not stopping this medication w/o speaking to the physician. Pt understands I will cancel GXT for next week (since she did not begin Propafenone).   She also understands I will only call her if Dr. Curt Bears has a recommendation, otherwise we will see her at schedule follow up in January 2019. Advised to call the office if she begins to experience issues w/ her flutter. Patient verbalized understanding and agreeable to plan.   Will forward to Dr. Curt Bears for his FYI and advisement, if he has any.

## 2016-11-06 NOTE — Telephone Encounter (Signed)
PA for bystolic started in cover my meds. Patient aware samples are at the front desk for pick up

## 2016-11-06 NOTE — Telephone Encounter (Signed)
Per pt call:  she has not started  PROPAFENONE 225mg  2x daily  Per pt she is nervous about taking it and the said effects.

## 2016-11-06 NOTE — Telephone Encounter (Signed)
°  Pt called for :  samples of Bystolic 7.5mg  needs more.    Pt has 7 days left.   Per pt will be in the area Thursday 25th   Oct. May come by.

## 2016-11-07 NOTE — Telephone Encounter (Signed)
Regular follow up unless she wishes to start an antiarrhythmic.

## 2016-11-13 ENCOUNTER — Ambulatory Visit (INDEPENDENT_AMBULATORY_CARE_PROVIDER_SITE_OTHER): Payer: BLUE CROSS/BLUE SHIELD | Admitting: Nurse Practitioner

## 2016-11-13 ENCOUNTER — Encounter: Payer: Self-pay | Admitting: Nurse Practitioner

## 2016-11-13 ENCOUNTER — Ambulatory Visit: Payer: BLUE CROSS/BLUE SHIELD | Admitting: Pulmonary Disease

## 2016-11-13 VITALS — BP 158/110 | HR 81 | Temp 98.5°F | Ht 60.0 in | Wt 126.0 lb

## 2016-11-13 DIAGNOSIS — J00 Acute nasopharyngitis [common cold]: Secondary | ICD-10-CM

## 2016-11-13 LAB — POCT INFLUENZA A/B
Influenza A, POC: NEGATIVE
Influenza B, POC: NEGATIVE

## 2016-11-13 MED ORDER — HYDROCODONE-HOMATROPINE 5-1.5 MG/5ML PO SYRP: 5.0000 mL | ORAL_SOLUTION | Freq: Four times a day (QID) | ORAL | 0 refills | Status: DC | PRN

## 2016-11-13 MED ORDER — DOXYCYCLINE HYCLATE 100 MG PO TABS: 100.0000 mg | ORAL_TABLET | Freq: Two times a day (BID) | ORAL | 0 refills | Status: AC

## 2016-11-13 MED ORDER — AZELASTINE-FLUTICASONE 137-50 MCG/ACT NA SUSP: 2.0000 | Freq: Every day | NASAL | 0 refills | Status: DC

## 2016-11-13 MED ORDER — DM-GUAIFENESIN ER 30-600 MG PO TB12: 1.0000 | ORAL_TABLET | Freq: Two times a day (BID) | ORAL | 0 refills | Status: DC | PRN

## 2016-11-13 MED ORDER — SALINE SPRAY 0.65 % NA SOLN: 1.0000 | NASAL | 0 refills | Status: DC | PRN

## 2016-11-13 MED ORDER — AZITHROMYCIN 250 MG PO TABS: 250.0000 mg | ORAL_TABLET | Freq: Every day | ORAL | 0 refills | Status: DC

## 2016-11-13 NOTE — Progress Notes (Signed)
Subjective:  Patient ID: [name], female    DOB: [birth year]  Age: 56 y.o. MRN: [id number]  CC: Nasal Congestion (fever 100.2,brown/green mucus/nasal congestion--3 days. )   URI   This is a new problem. The current episode started in the past 7 days. The problem has been gradually worsening. The maximum temperature recorded prior to her arrival was 100.4 - 100.9 F. Associated symptoms include chest pain, congestion, coughing, headaches, joint pain, a plugged ear sensation, rhinorrhea, sinus pain, sneezing, a sore throat and swollen glands. Pertinent negatives include no wheezing. She has tried acetaminophen for the symptoms. The treatment provided mild relief.  mild relief with mucinex and tylenol  Outpatient Medications Prior to Visit  Medication Sig Dispense Refill  . acyclovir (ZOVIRAX) 200 MG capsule Take 200 mg by mouth every other day.     [name] apixaban (ELIQUIS) 5 MG TABS tablet Take 1 tablet (5 mg total) by mouth 2 (two) times daily. 60 tablet 3  . BIOTIN PO Take by mouth daily.    . dorzolamide-timolol (COSOPT) 22.3-6.8 MG/ML ophthalmic solution Place 1 drop into the left eye daily.   12  . glucose blood test strip 1 each by Other route as directed. 100 each 3  . hydroxypropyl methylcellulose / hypromellose (ISOPTO TEARS / GONIOVISC) 2.5 % ophthalmic solution Place 1 drop into the right eye daily.    [name] loratadine (CLARITIN) 10 MG tablet Take 10 mg by mouth daily.    [name] losartan (COZAAR) 100 MG tablet Take 1 tablet (100 mg total) by mouth daily. Must keep appt on 11/29/16 90 tablet 0  . nebivolol (BYSTOLIC) 5 MG tablet Take 7.5 mg by mouth daily.    . Omega-3 300 MG CAPS Take 2 capsules by mouth daily.     . ONE TOUCH LANCETS MISC 1 each by Does not apply route as directed. 100 each 3  . potassium bicarbonate (KLOR-CON/EF) 25 MEQ disintegrating tablet Take 1 tablet (25 mEq total) by mouth 2 (two) times daily. 60 tablet 1  . predniSONE (DELTASONE) 5 MG tablet Take 5 mg by mouth  daily.    . Probiotic Product (PROBIOTIC DAILY PO) Take 1 each by mouth daily.    . propafenone (RYTHMOL) 225 MG tablet Take 1 tablet (225 mg total) by mouth 2 (two) times daily. 60 tablet 3  . vitamin B-12 (CYANOCOBALAMIN) 100 MCG tablet Take 100 mcg by mouth daily. Reported on 06/20/2015     No facility-administered medications prior to visit.     ROS See HPI  Objective:  BP (!) 158/110   Pulse 81   Temp 98.5 F (36.9 C)   Ht 5' (1.524 m)   Wt 126 lb (57.2 kg)   SpO2 98%   BMI 24.61 kg/m   BP Readings from Last 3 Encounters:  11/13/16 (!) 158/110  10/31/16 128/66  10/23/16 (!) 156/104    Wt Readings from Last 3 Encounters:  11/13/16 126 lb (57.2 kg)  10/31/16 125 lb 9.6 oz (57 kg)  10/23/16 125 lb 12.8 oz (57.1 kg)    Physical Exam  Constitutional: She is oriented to person, place, and time.  HENT:  Right Ear: Tympanic membrane, external ear and ear canal normal.  Left Ear: Tympanic membrane, external ear and ear canal normal.  Nose: Mucosal edema and rhinorrhea present. Right sinus exhibits maxillary sinus tenderness. Right sinus exhibits no frontal sinus tenderness. Left sinus exhibits maxillary sinus tenderness. Left sinus exhibits no frontal sinus tenderness.  Mouth/Throat: Uvula is  midline. No trismus in the jaw. Posterior oropharyngeal erythema present. No oropharyngeal exudate.  Eyes: No scleral icterus.  Neck: Normal range of motion. Neck supple.  Cardiovascular: Normal rate and normal heart sounds.   Pulmonary/Chest: Effort normal and breath sounds normal.  Musculoskeletal: She exhibits no edema.  Lymphadenopathy:    She has no cervical adenopathy.  Neurological: She is alert and oriented to person, place, and time.  Vitals reviewed.   Lab Results  Component Value Date   WBC 7.3 09/25/2016   HGB 15.9 09/25/2016   HCT 47.3 (H) 09/25/2016   PLT 267 09/25/2016   GLUCOSE 76 09/25/2016   CHOL 150 01/25/2013   TRIG 55.0 01/25/2013   HDL 38.90 (L)  01/25/2013   LDLCALC 100 (H) 01/25/2013   ALT 61 (H) 08/08/2016   AST 27 08/08/2016   NA 144 09/25/2016   K 4.3 09/25/2016   CL 102 09/25/2016   CREATININE 1.38 (H) 09/25/2016   BUN 18 09/25/2016   CO2 29 09/25/2016   TSH 2.73 05/09/2016   INR 1.18 05/23/2016   HGBA1C 5.9 05/18/2013    Mr Card Morphology Wo/w Cm  Result Date: 08/12/2016 CLINICAL DATA:  Sarcoid EXAM: CARDIAC MRI TECHNIQUE: The patient was scanned on a 1.5 Tesla GE magnet. A dedicated cardiac coil was used. Functional imaging was done using Fiesta sequences. 2,3, and 4 chamber views were done to assess for RWMA's. Modified Simpson's rule using a short axis stack was used to calculate an ejection fraction on a dedicated work Conservation officer, nature. The patient received 18 cc of Multihance. After 10 minutes inversion recovery sequences were used to assess for infiltration and scar tissue. CONTRAST:  18 cc Multihance FINDINGS: There was moderate LAE. There was mild RAE. The RV was normal in size and function. Aortic root was normal 3.2 cm. There was no ASD/VSD or pericardial effusion. The LV cavity was small The quantitative EF was 58% (ESV 76 cc EDV 32 cc SV 44 cc) The aortic valve was tri leaflet and normal. The tricuspid valve was normal with mild TR. There was moderate appearing central MR. Delayed enhancement images with gadolinium showed no infiltration, scar or evidence of sarcoid heart disease. Note the patient's HR was very erratic and most of the images were done with free breathing IMPRESSION: 1) No delayed gadolinium uptake on inversion recovery sequences No evidence of sarcoid heart disease 2) Moderate LAE and mild RAE 3) Moderate MR 4) Mild TR 5) Normal LV quantitative EF 58% Jenkins Rouge Electronically Signed   By: Jenkins Rouge M.D.   On: 08/12/2016 12:06    Assessment & Plan:   Ymani was seen today for nasal congestion.  Diagnoses and all orders for this visit:  Acute nasopharyngitis -     POCT  Influenza A/B -     Azelastine-Fluticasone (DYMISTA) 137-50 MCG/ACT SUSP; Place 2 sprays into the nose daily. -     dextromethorphan-guaiFENesin (MUCINEX DM) 30-600 MG 12hr tablet; Take 1 tablet by mouth 2 (two) times daily as needed for cough. -     Discontinue: HYDROcodone-homatropine (HYCODAN) 5-1.5 MG/5ML syrup; Take 5 mLs by mouth every 6 (six) hours as needed for cough. -     sodium chloride (OCEAN) 0.65 % SOLN nasal spray; Place 1 spray into both nostrils as needed for congestion. -     Discontinue: azithromycin (ZITHROMAX Z-PAK) 250 MG tablet; Take 1 tablet (250 mg total) by mouth daily. Take 2tabs on first day, then 1tab once a day  till complete -     doxycycline (VIBRA-TABS) 100 MG tablet; Take 1 tablet (100 mg total) by mouth 2 (two) times daily. -     HYDROcodone-homatropine (HYCODAN) 5-1.5 MG/5ML syrup; Take 5 mLs by mouth every 6 (six) hours as needed for cough.   I have discontinued Ms. Mclarty's azithromycin. I am also having her start on Azelastine-Fluticasone, dextromethorphan-guaiFENesin, sodium chloride, and doxycycline. Additionally, I am having her maintain her glucose blood, ONE TOUCH LANCETS, dorzolamide-timolol, vitamin B-12, Omega-3, loratadine, Probiotic Product (PROBIOTIC DAILY PO), acyclovir, hydroxypropyl methylcellulose / hypromellose, apixaban, predniSONE, BIOTIN PO, losartan, propafenone, potassium bicarbonate, nebivolol, and HYDROcodone-homatropine.  Meds ordered this encounter  Medications  . Azelastine-Fluticasone (DYMISTA) 137-50 MCG/ACT SUSP    Sig: Place 2 sprays into the nose daily.    Dispense:  1 Bottle    Refill:  0    Order Specific Question:   Supervising Provider    Answer:   Cassandria Anger [[id number]]  . dextromethorphan-guaiFENesin (MUCINEX DM) 30-600 MG 12hr tablet    Sig: Take 1 tablet by mouth 2 (two) times daily as needed for cough.    Dispense:  14 tablet    Refill:  0    Order Specific Question:   Supervising Provider    Answer:    Cassandria Anger [[id number]]  . DISCONTD: HYDROcodone-homatropine (HYCODAN) 5-1.5 MG/5ML syrup    Sig: Take 5 mLs by mouth every 6 (six) hours as needed for cough.    Dispense:  100 mL    Refill:  0    Order Specific Question:   Supervising Provider    Answer:   Cassandria Anger [[id number]]  . sodium chloride (OCEAN) 0.65 % SOLN nasal spray    Sig: Place 1 spray into both nostrils as needed for congestion.    Dispense:  15 mL    Refill:  0    Order Specific Question:   Supervising Provider    Answer:   Cassandria Anger [[id number]]  . DISCONTD: azithromycin (ZITHROMAX Z-PAK) 250 MG tablet    Sig: Take 1 tablet (250 mg total) by mouth daily. Take 2tabs on first day, then 1tab once a day till complete    Dispense:  6 tablet    Refill:  0    Order Specific Question:   Supervising Provider    Answer:   Cassandria Anger [[id number]]  . doxycycline (VIBRA-TABS) 100 MG tablet    Sig: Take 1 tablet (100 mg total) by mouth 2 (two) times daily.    Dispense:  14 tablet    Refill:  0    Order Specific Question:   Supervising Provider    Answer:   Cassandria Anger [[id number]]  . HYDROcodone-homatropine (HYCODAN) 5-1.5 MG/5ML syrup    Sig: Take 5 mLs by mouth every 6 (six) hours as needed for cough.    Dispense:  100 mL    Refill:  0    Order Specific Question:   Supervising Provider    Answer:   Cassandria Anger [[id number]]    Follow-up: No Follow-up on file.  Wilfred Lacy, NP

## 2016-11-13 NOTE — Patient Instructions (Signed)
URI Instructions: Encourage adequate oral hydration.  Use over-the-counter  "cold" medicines  such as "Tylenol cold" , "Advil cold",  "Mucinex" or" Mucinex DM"  for cough and congestion.  Avoid decongestants if you have high blood pressure. Use" Delsym" or" Robitussin" cough syrup varietis for cough.  You can use plain "Tylenol" or "Advi"l for fever, chills and achyness.   "Common cold" symptoms are usually triggered by a virus.  The antibiotics are usually not necessary. On average, a" viral cold" illness would take 4-7 days to resolve. Please, make an appointment if you are not better or if you're worse.  Start doxycycline if no improvement by Friday.

## 2016-11-14 ENCOUNTER — Ambulatory Visit: Payer: BLUE CROSS/BLUE SHIELD | Admitting: Family Medicine

## 2016-11-16 ENCOUNTER — Telehealth: Payer: Self-pay

## 2016-11-16 NOTE — Telephone Encounter (Signed)
The patient called in on Saturday clinic and stated that she was recently seen by Baldo Ash and was given an antibiotic and other medications. She stated that diflucan should have been sent in also for a yeast infection from taking the antibiotic. I explained to her that I do see where mediations was sent in during her visit but do not see the diflucan listed as one of those. Since it was not a current medication on her list I am not allowed to send it in since it would need to be authorized by a provider. I explained that I would send a telephone note to [facility] and her assistant so it could be taken care of on Monday. She expressed understanding.

## 2016-11-17 MED ORDER — FLUCONAZOLE 150 MG PO TABS: 150.0000 mg | ORAL_TABLET | Freq: Once | ORAL | 0 refills | Status: DC

## 2016-11-18 ENCOUNTER — Encounter: Payer: Self-pay | Admitting: Nurse Practitioner

## 2016-11-18 ENCOUNTER — Telehealth: Payer: Self-pay | Admitting: Nurse Practitioner

## 2016-11-18 ENCOUNTER — Telehealth: Payer: Self-pay | Admitting: Internal Medicine

## 2016-11-18 NOTE — Telephone Encounter (Signed)
Stop medication and go to ED if symptoms worsen

## 2016-11-18 NOTE — Telephone Encounter (Signed)
Patient Name: [name]  DOB: 25-Jun-1960    Initial Comment Caller states she is having shortness of breath from an antibiotic . She has a yellowish color in urine and bowels . She has a dull headache .    Nurse Assessment  Nurse: Leilani Merl, RN, Heather Date/Time (Eastern Time): 11/18/2016 8:31:54 AM  Confirm and document reason for call. If symptomatic, describe symptoms. ---Caller states she is having shortness of breath from an antibiotic, doxycycline, she started the antibiotic on Saturday . She has a yellowish color in urine and bowels . She has a dull headache . she is being treated for a sinus infection.  Does the patient have any new or worsening symptoms? ---Yes  Will a triage be completed? ---Yes  Related visit to physician within the last 2 weeks? ---Yes  Does the PT have any chronic conditions? (i.e. diabetes, asthma, etc.) ---Yes  List chronic conditions. ---See MR  Is this a behavioral health or substance abuse call? ---No     Guidelines    Guideline Title Affirmed Question Affirmed Notes  Sinus Infection on Antibiotic Follow-up Call [1] Taking antibiotic < 72 hours (3 days) AND [2] sinus pain not improved    Final Disposition User   Arena, RN, Heather    Comments  Caller states that she is only having SOB on exertion which is new since she started the antibiotic. She wants to stop the antibiotic and plans on doing that today and only wants to be seen at the office if she does not get better after the antibiotic is out of her system. Please call patient back and let her know what her physician thinks about what she is planning to do.   Caller Disagree/Comply Comply  Caller Understands Yes  PreDisposition Call Doctor

## 2016-11-18 NOTE — Telephone Encounter (Signed)
Pt called and is having a reaction to her doxycycline (VIBRA-TABS) 100 MG tablet  She is having shortness of breath and and her urine and bowels are yellow. Please advise and call back, asks to leave a message and she will call back on her lunch break

## 2016-11-18 NOTE — Telephone Encounter (Signed)
Diflucan sent to [facility] to [address] as well.

## 2016-11-18 NOTE — Telephone Encounter (Signed)
Patient has been informed letter is completed & up front for pick up.

## 2016-11-18 NOTE — Telephone Encounter (Signed)
Spoke with pt, verbalized understand.   Please check and make sure diflucan sent to [facility] in [address]  Pt stated her boss will not except the letter for her to go back to work on 11/16/2016 since she doesn't work on Sat and Sun. She was wondering if we can write new letter stating for her to go back to work 11/18/2016 (she is at work today). Please advise.

## 2016-11-18 NOTE — Telephone Encounter (Signed)
Called pt, LVM stating that med has been sent in and should be available for pick up today.

## 2016-11-18 NOTE — Telephone Encounter (Signed)
Please advise, pt sent to triage as well.

## 2016-12-09 ENCOUNTER — Telehealth: Payer: Self-pay | Admitting: Cardiology

## 2016-12-09 ENCOUNTER — Encounter: Payer: Self-pay | Admitting: Internal Medicine

## 2016-12-09 ENCOUNTER — Ambulatory Visit: Payer: BLUE CROSS/BLUE SHIELD | Admitting: Internal Medicine

## 2016-12-09 ENCOUNTER — Other Ambulatory Visit (INDEPENDENT_AMBULATORY_CARE_PROVIDER_SITE_OTHER): Payer: BLUE CROSS/BLUE SHIELD

## 2016-12-09 DIAGNOSIS — I4891 Unspecified atrial fibrillation: Secondary | ICD-10-CM

## 2016-12-09 DIAGNOSIS — E538 Deficiency of other specified B group vitamins: Secondary | ICD-10-CM | POA: Diagnosis not present

## 2016-12-09 DIAGNOSIS — R7989 Other specified abnormal findings of blood chemistry: Secondary | ICD-10-CM

## 2016-12-09 DIAGNOSIS — R945 Abnormal results of liver function studies: Secondary | ICD-10-CM

## 2016-12-09 DIAGNOSIS — D869 Sarcoidosis, unspecified: Secondary | ICD-10-CM | POA: Diagnosis not present

## 2016-12-09 DIAGNOSIS — J4540 Moderate persistent asthma, uncomplicated: Secondary | ICD-10-CM

## 2016-12-09 DIAGNOSIS — I10 Essential (primary) hypertension: Secondary | ICD-10-CM | POA: Diagnosis not present

## 2016-12-09 LAB — BASIC METABOLIC PANEL
BUN: 28 mg/dL — AB (ref 6–23)
CALCIUM: 9.9 mg/dL (ref 8.4–10.5)
CO2: 31 mEq/L (ref 19–32)
CREATININE: 1.63 mg/dL — AB (ref 0.40–1.20)
Chloride: 102 mEq/L (ref 96–112)
GFR: 41.77 mL/min — AB (ref >60.00)
Glucose, Bld: 141 mg/dL — ABNORMAL HIGH (ref 70–99)
Potassium: 4 mEq/L (ref 3.5–5.1)
Sodium: 140 mEq/L (ref 135–145)

## 2016-12-09 LAB — HEPATIC FUNCTION PANEL
ALK PHOS: 123 U/L — AB (ref 39–117)
ALT: 57 U/L — AB (ref 0–35)
AST: 47 U/L — AB (ref 0–37)
Albumin: 3.9 g/dL (ref 3.5–5.2)
BILIRUBIN DIRECT: 0.2 mg/dL (ref 0.0–0.3)
TOTAL PROTEIN: 6.9 g/dL (ref 6.0–8.3)
Total Bilirubin: 1.2 mg/dL (ref 0.2–1.2)

## 2016-12-09 MED ORDER — LOSARTAN POTASSIUM 100 MG PO TABS: 50.0000 mg | ORAL_TABLET | Freq: Every day | ORAL | 0 refills | Status: DC

## 2016-12-09 MED ORDER — LEVALBUTEROL TARTRATE 45 MCG/ACT IN AERO: 2.0000 | INHALATION_SPRAY | Freq: Four times a day (QID) | RESPIRATORY_TRACT | 12 refills | Status: DC | PRN

## 2016-12-09 NOTE — Addendum Note (Signed)
Addended by: Cassandria Anger on: 12/09/2016 11:33 PM   Modules accepted: Orders

## 2016-12-09 NOTE — Assessment & Plan Note (Signed)
Eliquis, Flecainide  

## 2016-12-09 NOTE — Telephone Encounter (Signed)
Patient aware samples are at the front desk for pick up  

## 2016-12-09 NOTE — Progress Notes (Signed)
Subjective:  Patient ID: [name], female    DOB: 04/23/1960  Age: 56 y.o. 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 acyclovir (ZOVIRAX) 200 MG capsule Take 200 mg by mouth every other day.     [name] apixaban (ELIQUIS) 5 MG TABS tablet Take 1 tablet (5 mg total) by mouth 2 (two) times daily. 60 tablet 3  . BIOTIN PO Take by mouth daily.    . dorzolamide-timolol (COSOPT) 22.3-6.8 MG/ML ophthalmic solution Place 1 drop into the left eye daily.   12  . glucose blood test strip 1 each by Other route as directed. 100 each 3  . hydroxypropyl methylcellulose / hypromellose (ISOPTO TEARS / GONIOVISC) 2.5 % ophthalmic solution Place 1 drop into the right eye daily.    [name] loratadine (CLARITIN) 10 MG tablet Take 10 mg by mouth daily.    [name] losartan (COZAAR) 100 MG tablet Take 1 tablet (100 mg total) by mouth daily. Must keep appt on 11/29/16 90 tablet 0  . nebivolol (BYSTOLIC) 5 MG tablet Take 7.5 mg by mouth daily.    . Omega-3 300 MG CAPS Take 2 capsules by mouth daily.     . ONE TOUCH LANCETS MISC 1 each by Does not apply route as directed. 100 each 3  . potassium bicarbonate (KLOR-CON/EF) 25 MEQ disintegrating tablet Take 1 tablet (25 mEq total) by mouth 2 (two) times daily. 60 tablet 1  . predniSONE (DELTASONE) 5 MG tablet Take 5 mg by mouth daily.    . Probiotic Product (PROBIOTIC DAILY PO) Take 1 each by mouth daily.    . vitamin B-12 (CYANOCOBALAMIN) 100 MCG tablet Take 100 mcg by mouth daily. Reported on 06/20/2015    . Azelastine-Fluticasone (DYMISTA) 137-50 MCG/ACT SUSP Place 2 sprays into the nose daily. (Patient not taking: Reported on 12/09/2016) 1 Bottle 0  . dextromethorphan-guaiFENesin (MUCINEX DM) 30-600 MG 12hr tablet Take 1 tablet by mouth 2 (two) times daily as needed for cough. (Patient not taking: Reported  on 12/09/2016) 14 tablet 0  . HYDROcodone-homatropine (HYCODAN) 5-1.5 MG/5ML syrup Take 5 mLs by mouth every 6 (six) hours as needed for cough. (Patient not taking: Reported on 12/09/2016) 100 mL 0  . propafenone (RYTHMOL) 225 MG tablet Take 1 tablet (225 mg total) by mouth 2 (two) times daily. (Patient not taking: Reported on 12/09/2016) 60 tablet 3  . sodium chloride (OCEAN) 0.65 % SOLN nasal spray Place 1 spray into both nostrils as needed for congestion. (Patient not taking: Reported on 12/09/2016) 15 mL 0   No facility-administered medications prior to visit.     ROS Review of Systems  Constitutional: Positive for fatigue. Negative for activity change, appetite change, chills and unexpected weight change.  HENT: Negative for congestion, mouth sores and sinus pressure.   Eyes: Negative for visual disturbance.  Respiratory: Positive for shortness of breath. Negative for cough and chest tightness.   Gastrointestinal: Negative for abdominal pain and nausea.  Genitourinary: Negative for difficulty urinating, frequency and vaginal pain.  Musculoskeletal: Negative for back pain and gait problem.  Skin: Negative for pallor and rash.  Neurological: Negative for dizziness, tremors, weakness, numbness and headaches.  Psychiatric/Behavioral: Negative for confusion and sleep disturbance.    Objective:  BP 120/78   Pulse 77   Temp (!) 97.5  F (36.4 C)   Wt 127 lb (57.6 kg)   SpO2 97%   BMI 24.80 kg/m   BP Readings from Last 3 Encounters:  12/09/16 120/78  11/13/16 (!) 158/110  10/31/16 128/66    Wt Readings from Last 3 Encounters:  12/09/16 127 lb (57.6 kg)  11/13/16 126 lb (57.2 kg)  10/31/16 125 lb 9.6 oz (57 kg)    Physical Exam  Constitutional: She appears well-developed. No distress.  HENT:  Head: Normocephalic.  Right Ear: External ear normal.  Left Ear: External ear normal.  Nose: Nose normal.  Mouth/Throat: Oropharynx is clear and moist.  Eyes: Conjunctivae are  normal. Pupils are equal, round, and reactive to light. Right eye exhibits no discharge. Left eye exhibits no discharge.  Neck: Normal range of motion. Neck supple. No JVD present. No tracheal deviation present. No thyromegaly present.  Pulmonary/Chest: No stridor. No respiratory distress. She has no wheezes.  Abdominal: Soft. Bowel sounds are normal. She exhibits no distension and no mass. There is no tenderness. There is no rebound and no guarding.  Musculoskeletal: She exhibits no edema or tenderness.  Lymphadenopathy:    She has no cervical adenopathy.  Neurological: She displays normal reflexes. No cranial nerve deficit. She exhibits normal muscle tone. Coordination normal.  Skin: No rash noted. No erythema.  Psychiatric: She has a normal mood and affect. Her behavior is normal. Judgment and thought content normal.  irreg irreg RR  Lab Results  Component Value Date   WBC 7.3 09/25/2016   HGB 15.9 09/25/2016   HCT 47.3 (H) 09/25/2016   PLT 267 09/25/2016   GLUCOSE 76 09/25/2016   CHOL 150 01/25/2013   TRIG 55.0 01/25/2013   HDL 38.90 (L) 01/25/2013   LDLCALC 100 (H) 01/25/2013   ALT 61 (H) 08/08/2016   AST 27 08/08/2016   NA 144 09/25/2016   K 4.3 09/25/2016   CL 102 09/25/2016   CREATININE 1.38 (H) 09/25/2016   BUN 18 09/25/2016   CO2 29 09/25/2016   TSH 2.73 05/09/2016   INR 1.18 05/23/2016   HGBA1C 5.9 05/18/2013    Mr Card Morphology Wo/w Cm  Result Date: 08/12/2016 CLINICAL DATA:  Sarcoid EXAM: CARDIAC MRI TECHNIQUE: The patient was scanned on a 1.5 Tesla GE magnet. A dedicated cardiac coil was used. Functional imaging was done using Fiesta sequences. 2,3, and 4 chamber views were done to assess for RWMA's. Modified Simpson's rule using a short axis stack was used to calculate an ejection fraction on a dedicated work Conservation officer, nature. The patient received 18 cc of Multihance. After 10 minutes inversion recovery sequences were used to assess for infiltration  and scar tissue. CONTRAST:  18 cc Multihance FINDINGS: There was moderate LAE. There was mild RAE. The RV was normal in size and function. Aortic root was normal 3.2 cm. There was no ASD/VSD or pericardial effusion. The LV cavity was small The quantitative EF was 58% (ESV 76 cc EDV 32 cc SV 44 cc) The aortic valve was tri leaflet and normal. The tricuspid valve was normal with mild TR. There was moderate appearing central MR. Delayed enhancement images with gadolinium showed no infiltration, scar or evidence of sarcoid heart disease. Note the patient's HR was very erratic and most of the images were done with free breathing IMPRESSION: 1) No delayed gadolinium uptake on inversion recovery sequences No evidence of sarcoid heart disease 2) Moderate LAE and mild RAE 3) Moderate MR 4) Mild TR 5) Normal LV quantitative  EF 58% Jenkins Rouge Electronically Signed   By: Jenkins Rouge M.D.   On: 08/12/2016 12:06    Assessment & Plan:   There are no diagnoses linked to this encounter. I am having [name] maintain her glucose blood, ONE TOUCH LANCETS, dorzolamide-timolol, vitamin B-12, Omega-3, loratadine, Probiotic Product (PROBIOTIC DAILY PO), acyclovir, hydroxypropyl methylcellulose / hypromellose, apixaban, predniSONE, BIOTIN PO, losartan, propafenone, potassium bicarbonate, nebivolol, Azelastine-Fluticasone, dextromethorphan-guaiFENesin, sodium chloride, and HYDROcodone-homatropine.  No orders of the defined types were placed in this encounter.    Follow-up: No Follow-up on file.  Walker Kehr, MD

## 2016-12-09 NOTE — Assessment & Plan Note (Signed)
Labs

## 2016-12-09 NOTE — Assessment & Plan Note (Signed)
On steroids F/u tests are scheduled

## 2016-12-09 NOTE — Assessment & Plan Note (Addendum)
On steroids po Xopenex prn

## 2016-12-09 NOTE — Assessment & Plan Note (Signed)
Losartan, Bystolic, Norvasc

## 2016-12-09 NOTE — Telephone Encounter (Signed)
Call after 3 pm   Patient calling the office for samples of medication:   1.  What medication and dosage are you requesting samples for? bistolic 7.5mg    2.  Are you currently out of this medication? Pt has a week left

## 2016-12-09 NOTE — Assessment & Plan Note (Signed)
On B12 

## 2016-12-11 ENCOUNTER — Encounter: Payer: Self-pay | Admitting: Cardiology

## 2016-12-11 NOTE — Progress Notes (Deleted)
HPI: FU atrial flutter. Patient with history of atrial flutter on electrocardiogram. Echocardiogram January 2018 showed normal LV function and mild to moderate mitral regurgitation. Pt seen by Dr Curt Bears and flutter felt atypical; ablation not recommended. Pt started on flecanide and scheduled for DCCV but was in sinus at presentation for procedure. Exercise treadmill March 2018 showed no chest pain, no ST changes and no exercise-induced ventricular tachycardia. Chest CT June 2018 showed progression of pulmonary sarcoidosis. There was also note of a 4.2 cm thoracic aortic aneurysm. Cardiac MRI July 2018 showed no evidence of sarcoid, ejection fraction 58%, moderate left atrial enlargement, mild right atrial enlargement, moderate mitral regurgitation and mild tricuspid regurgitation. Patient's amlodipine previously decreased because of pedal edema. She discontinued her flecainide on her own because of increased palpitations. At last office visit patient in recurrent atrial flutter. Holter monitor October 2018 showed sinus rhythm with first-degree AV block, PACs, PVCs, couplets, nonsustained ventricular tachycardia and atrial flutter. Seen by Dr Curt Bears and started on propafenone. Since last seen,   Current Outpatient Medications  Medication Sig Dispense Refill  . acyclovir (ZOVIRAX) 200 MG capsule Take 200 mg by mouth every other day.     [name] apixaban (ELIQUIS) 5 MG TABS tablet Take 1 tablet (5 mg total) by mouth 2 (two) times daily. 60 tablet 3  . Azelastine-Fluticasone (DYMISTA) 137-50 MCG/ACT SUSP Place 2 sprays into the nose daily. (Patient not taking: Reported on 12/09/2016) 1 Bottle 0  . BIOTIN PO Take by mouth daily.    [name] dextromethorphan-guaiFENesin (MUCINEX DM) 30-600 MG 12hr tablet Take 1 tablet by mouth 2 (two) times daily as needed for cough. (Patient not taking: Reported on 12/09/2016) 14 tablet 0  . dorzolamide-timolol (COSOPT) 22.3-6.8 MG/ML ophthalmic solution Place 1 drop into the  left eye daily.   12  . glucose blood test strip 1 each by Other route as directed. 100 each 3  . HYDROcodone-homatropine (HYCODAN) 5-1.5 MG/5ML syrup Take 5 mLs by mouth every 6 (six) hours as needed for cough. (Patient not taking: Reported on 12/09/2016) 100 mL 0  . hydroxypropyl methylcellulose / hypromellose (ISOPTO TEARS / GONIOVISC) 2.5 % ophthalmic solution Place 1 drop into the right eye daily.    [name] levalbuterol (XOPENEX HFA) 45 MCG/ACT inhaler Inhale 2 puffs into the lungs every 6 (six) hours as needed for wheezing. 1 Inhaler 12  . loratadine (CLARITIN) 10 MG tablet Take 10 mg by mouth daily.    [name] losartan (COZAAR) 100 MG tablet Take 0.5 tablets (50 mg total) by mouth daily. Must keep appt on 11/29/16 90 tablet 0  . nebivolol (BYSTOLIC) 5 MG tablet Take 7.5 mg by mouth daily.    . Omega-3 300 MG CAPS Take 2 capsules by mouth daily.     . ONE TOUCH LANCETS MISC 1 each by Does not apply route as directed. 100 each 3  . potassium bicarbonate (KLOR-CON/EF) 25 MEQ disintegrating tablet Take 1 tablet (25 mEq total) by mouth 2 (two) times daily. 60 tablet 1  . predniSONE (DELTASONE) 5 MG tablet Take 5 mg by mouth daily.    . Probiotic Product (PROBIOTIC DAILY PO) Take 1 each by mouth daily.    . propafenone (RYTHMOL) 225 MG tablet Take 1 tablet (225 mg total) by mouth 2 (two) times daily. (Patient not taking: Reported on 12/09/2016) 60 tablet 3  . sodium chloride (OCEAN) 0.65 % SOLN nasal spray Place 1 spray into both nostrils as needed for congestion. (Patient not taking:  Reported on 12/09/2016) 15 mL 0  . vitamin B-12 (CYANOCOBALAMIN) 100 MCG tablet Take 100 mcg by mouth daily. Reported on 06/20/2015     No current facility-administered medications for this visit.      Past Medical History:  Diagnosis Date  . AAA (abdominal aortic aneurysm) (HCC)    a. 4.2 cm by CT in 06/2016  . Allergy   . Anxiety   . Asthma   . Atrial flutter (HCC)    a. on Flecainide and Eliquis  . Cataract   .  Depression    pt states no depression 07-11-15  . Glaucoma   . Heart murmur    as child only  . Herpes simplex   . Hypertension   . Multiple thyroid nodules   . Osteoarthritis   . Osteoporosis   . PUD (peptic ulcer disease)   . Pulmonary sarcoidosis ([place])   . Tubular adenoma of colon 07/2015  . Vitamin D deficiency     Past Surgical History:  Procedure Laterality Date  . BUNIONECTOMY     x2  . CATARACT EXTRACTION Right   . DENTAL SURGERY    . DILATION AND CURETTAGE OF UTERUS    . fibroid tumor removal    . GLAUCOMA SURGERY     Right eye  . MYOMECTOMY      Social History   Socioeconomic History  . Marital status: Divorced    Spouse name: Not on file  . Number of children: Not on file  . Years of education: Not on file  . Highest education level: Not on file  Social Needs  . Financial resource strain: Not on file  . Food insecurity - worry: Not on file  . Food insecurity - inability: Not on file  . Transportation needs - medical: Not on file  . Transportation needs - non-medical: Not on file  Occupational History  . Not on file  Tobacco Use  . Smoking status: Never Smoker  . Smokeless tobacco: Never Used  Substance and Sexual Activity  . Alcohol use: No    Alcohol/week: 0.0 oz  . Drug use: No  . Sexual activity: No    Birth control/protection: Abstinence  Other Topics Concern  . Not on file  Social History Narrative   Single/divorced; no children; she has a friend now   Never smoked   Positive history of passive tobacco smoke exposure.   Exercise- up to 4 times a week   Caffeine- 1-2 per week    Family History  Problem Relation Age of Onset  . Lung cancer Mother        sarcoid  . Stroke Father   . Hypertension Father   . Heart Problems Father   . Stomach cancer Cousin        3rd cousin   . Colon cancer Neg Hx   . Colon polyps Neg Hx   . Esophageal cancer Neg Hx   . Rectal cancer Neg Hx     ROS: no fevers or chills, productive cough,  hemoptysis, dysphasia, odynophagia, melena, hematochezia, dysuria, hematuria, rash, seizure activity, orthopnea, PND, pedal edema, claudication. Remaining systems are negative.  Physical Exam: Well-developed well-nourished in no acute distress.  Skin is warm and dry.  HEENT is normal.  Neck is supple.  Chest is clear to auscultation with normal expansion.  Cardiovascular exam is regular rate and rhythm.  Abdominal exam nontender or distended. No masses palpated. Extremities show no edema. neuro grossly intact  ECG- personally reviewed  A/P  1  Kirk Ruths, MD

## 2016-12-16 ENCOUNTER — Ambulatory Visit (INDEPENDENT_AMBULATORY_CARE_PROVIDER_SITE_OTHER)
Admission: RE | Admit: 2016-12-16 | Discharge: 2016-12-16 | Disposition: A | Discharge status: Home / Self Care | Payer: BLUE CROSS/BLUE SHIELD | Source: Ambulatory Visit | Attending: Pulmonary Disease | Admitting: Pulmonary Disease

## 2016-12-16 ENCOUNTER — Encounter: Payer: Self-pay | Admitting: Pulmonary Disease

## 2016-12-16 DIAGNOSIS — R0602 Shortness of breath: Secondary | ICD-10-CM | POA: Diagnosis not present

## 2016-12-17 ENCOUNTER — Telehealth: Payer: Self-pay | Admitting: Pulmonary Disease

## 2016-12-17 NOTE — Telephone Encounter (Signed)
error 

## 2016-12-17 NOTE — Telephone Encounter (Signed)
Notes recorded by Juanito Doom, MD on 12/16/2016 at 3:45 PM EST C, Please make sure she has an appointment with me in the next 2-4 weeks. Thanks, B  Spoke with pt, she does not want to wait until 12/19 for results. BQ please advise.

## 2016-12-18 NOTE — Telephone Encounter (Signed)
agree

## 2016-12-18 NOTE — Telephone Encounter (Signed)
Will close encounter

## 2016-12-18 NOTE — Telephone Encounter (Signed)
Patient called and scheduled appt to go over results, 12/19/16 at 9:15 am.  No call back is needed.

## 2016-12-19 ENCOUNTER — Ambulatory Visit: Payer: BLUE CROSS/BLUE SHIELD | Admitting: Pulmonary Disease

## 2016-12-19 ENCOUNTER — Telehealth: Payer: Self-pay

## 2016-12-19 ENCOUNTER — Other Ambulatory Visit (INDEPENDENT_AMBULATORY_CARE_PROVIDER_SITE_OTHER): Payer: BLUE CROSS/BLUE SHIELD

## 2016-12-19 VITALS — BP 140/80 | HR 95 | Ht 60.0 in | Wt 128.0 lb

## 2016-12-19 DIAGNOSIS — D869 Sarcoidosis, unspecified: Secondary | ICD-10-CM

## 2016-12-19 DIAGNOSIS — K769 Liver disease, unspecified: Secondary | ICD-10-CM | POA: Diagnosis not present

## 2016-12-19 DIAGNOSIS — R0602 Shortness of breath: Secondary | ICD-10-CM

## 2016-12-19 DIAGNOSIS — J90 Pleural effusion, not elsewhere classified: Secondary | ICD-10-CM

## 2016-12-19 LAB — CBC WITH DIFFERENTIAL/PLATELET
BASOS ABS: 0.1 10*3/uL (ref 0.0–0.1)
Basophils Relative: 1.1 % (ref 0.0–3.0)
EOS ABS: 0.2 10*3/uL (ref 0.0–0.7)
Eosinophils Relative: 2.5 % (ref 0.0–5.0)
HCT: 47.4 % — ABNORMAL HIGH (ref 36.0–46.0)
Hemoglobin: 15.6 g/dL — ABNORMAL HIGH (ref 12.0–15.0)
LYMPHS ABS: 1 10*3/uL (ref 0.7–4.0)
LYMPHS PCT: 11.5 % — AB (ref 12.0–46.0)
MCHC: 33 g/dL (ref 30.0–36.0)
MCV: 93.9 fl (ref 78.0–100.0)
MONO ABS: 0.8 10*3/uL (ref 0.1–1.0)
Monocytes Relative: 9.1 % (ref 3.0–12.0)
NEUTROS ABS: 6.3 10*3/uL (ref 1.4–7.7)
NEUTROS PCT: 75.8 % (ref 43.0–77.0)
PLATELETS: 251 10*3/uL (ref 150.0–400.0)
RBC: 5.05 Mil/uL (ref 3.87–5.11)
RDW: 15.7 % — ABNORMAL HIGH (ref 11.5–15.5)
WBC: 8.3 10*3/uL (ref 4.0–10.5)

## 2016-12-19 LAB — BRAIN NATRIURETIC PEPTIDE: PRO B NATRI PEPTIDE: 277 pg/mL — AB (ref 0.0–100.0)

## 2016-12-19 NOTE — Progress Notes (Signed)
Subjective:    Patient ID: [name], female    DOB: 07/29/1960, 56 y.o.   MRN: [id number]  Synopsis: Former patient of Dr. Joya Gaskins who has sarcoidosis. She also has asthma. She was diagnosed with a skin biopsy in 1983 when she had a rash.  The disease had been relatively quiet for years but in 2018 she developed a large liver mass which was noted to be full of non-caseating granulomas on biopsy.  A HRCT performed in 2018 showed worsening consolidative changes in her lungs bilaterally consistent with sarcoidosis.  She started taking prednisone on June 30.   HPI  No chief complaint on file.  Ceniyah had a sinus infection and a cold about three to four weeks ago.  She had to be treated with an antibiotic for this. During that time she said that she got more short of breath afterwards.  Since then she has noticed more dyspnea.  This is worse in the mornings rather than in the afternoons.  She is still trying to exercise twice per week.  She says that prior to her illness she was not feeling more shortness of breath.    She says that she has a little swelling, its better than earlier.  She is wheezing more.    Past Medical History:  Diagnosis Date  . AAA (abdominal aortic aneurysm) (HCC)    a. 4.2 cm by CT in 06/2016  . Allergy   . Anxiety   . Asthma   . Atrial flutter (HCC)    a. on Flecainide and Eliquis  . Cataract   . Depression    pt states no depression 07-11-15  . Glaucoma   . Heart murmur    as child only  . Herpes simplex   . Hypertension   . Multiple thyroid nodules   . Osteoarthritis   . Osteoporosis   . PUD (peptic ulcer disease)   . Pulmonary sarcoidosis ([place])   . Tubular adenoma of colon 07/2015  . Vitamin D deficiency      Review of Systems  Constitutional: Negative for chills, fatigue and fever.  HENT: Negative for nosebleeds, postnasal drip, rhinorrhea, sinus pressure and sneezing.   Respiratory: Negative for cough, shortness of breath and wheezing.     Cardiovascular: Negative for chest pain, palpitations and leg swelling.       Objective:   Physical Exam Vitals:   12/19/16 0936  BP: 140/80  Pulse: 95  SpO2: 97%  Weight: 128 lb (58.1 kg)  Height: 5' (1.524 m)   RA  Gen: well appearing HENT: OP clear, TM's clear, neck supple PULM: Crackles upper lobes B, normal percussion CV: Irreg irreg, no mgr, trace edema GI: BS+, soft, nontender Derm: no cyanosis or rash Psyche: normal mood and affect     Imaging: CXR images from 2016 reviewed: There is significant upper lobe scarring which radiology feels has progressed compared to the prior study 06/2016 HRCT> significant interval worsening of pulmonary parenchymal abnormality, mediastinal lymphadenopathy worse; also with ectactic aorta, suggestion of pulmonary hypertension 07/2016 Cardiac MRI: no evidence of sarcoidosis, mild LAE and RAE, LVEF 58% 12/2016 HRCT> per radiology some increase in bilateral upper lobe predominant fibrosis, images independently reviewed, its not clear to me that this has progressed as some of the patchy airspace disease has decreased, however there is a new R pleural effusion  PFT: June 2014 pulmonary function testing ratio 77%, FEV1 1.99 L, 104% predicted), FVC 2.60 L, 108% predicted, total lung capacity 3.75 L (  84% predicted), DLCO 20.0 (106% predicted) July 2018 pulmonary function testing from [facility] ratio 55%, FVC 2.17 L 82% predicted, FEV1 1.27 L, total lung capacity 4.4 L 85% protected, DLCO 65% predicted November 2018 spirometry from [facility] Ratio 55% FVC 1.74 L  Pathology: May 2018 liver biopsy showed noncaseating granulomas, special stains were negative for organisms.  Records from her last visit with her primary care physician and may reviewed were she requested a referral to [facility] for further evaluation of sarcoidosis in the liver. Records revisit in May 2018 with GI also reviewed were she was noted to have an abnormal echotexture on liver ultrasound  and then eventually an MRI which showed a distinct lesion in her liver which was worrisome for malignancy.  A biopsy was performed which showed sarcoidosis, see above.     Assessment & Plan:  Liver lesion  Sarcoidosis  Shortness of breath  Pleural effusion on right  Discussion: Ms. Windom says that ever since she had an upper respiratory infection several weeks ago she has been experiencing more shortness of breath and wheezing.  Her CT scan at [facility] did show worsening of her forced vital capacity in the report from the CT scan performed 2 days ago says that there is worsening fibrosis in her lungs.  However, I have looked at these images multiple times and it is difficult for me to say that there is truly worsening fibrotic change.  I do believe there is actually some improvement in some of the airspace disease that was previously seen with, the most predominant new finding is a new right-sided pleural effusion.  This makes me wonder whether or not she could have pulmonary hypertension or heart failure as a cause for her symptoms right now.  Her A. fib has been at baseline she reports no more frequent episodes of palpitations.  Before we make a judgment as to what to do with this worsening finding on the CT scan (questionable in my mind) and whether or not to advance her immunosuppression I want to get more objective data with a 6-minute walk, pleural fluid sampling and lab tests, and an evaluation of her heart.  In addition we need to see the MRI abdomen results.   Plan: Pulmonary sarcoidosis: We will arrange a 6 min walk test Continue prednisone for now  Liver sarcoidosis: Continue prednisone for now We will follow up your MRI abdomen  Pleural effusion (fluid in the lung): I'm not sure why you have this We will as radiology to sample the fluid for a test We will arrange for an echocardiogram to assess your heart function  Shortness of breath:  We will get some blood work to look at  your hemoglobin value  Follow up in 2 weeks to go over these results    Current Outpatient Medications:  .  acyclovir (ZOVIRAX) 200 MG capsule, Take 200 mg by mouth every other day. , Disp: , Rfl:  .  apixaban (ELIQUIS) 5 MG TABS tablet, Take 1 tablet (5 mg total) by mouth 2 (two) times daily., Disp: 60 tablet, Rfl: 3 .  BIOTIN PO, Take by mouth daily., Disp: , Rfl:  .  dorzolamide-timolol (COSOPT) 22.3-6.8 MG/ML ophthalmic solution, Place 1 drop into the left eye daily. , Disp: , Rfl: 12 .  glucose blood test strip, 1 each by Other route as directed., Disp: 100 each, Rfl: 3 .  hydroxypropyl methylcellulose / hypromellose (ISOPTO TEARS / GONIOVISC) 2.5 % ophthalmic solution, Place 1 drop into the right eye  daily., Disp: , Rfl:  .  loratadine (CLARITIN) 10 MG tablet, Take 10 mg by mouth daily., Disp: , Rfl:  .  losartan (COZAAR) 100 MG tablet, Take 0.5 tablets (50 mg total) by mouth daily. Must keep appt on 11/29/16, Disp: 90 tablet, Rfl: 0 .  nebivolol (BYSTOLIC) 5 MG tablet, Take 7.5 mg by mouth daily., Disp: , Rfl:  .  Omega-3 300 MG CAPS, Take 2 capsules by mouth daily. , Disp: , Rfl:  .  ONE TOUCH LANCETS MISC, 1 each by Does not apply route as directed., Disp: 100 each, Rfl: 3 .  potassium bicarbonate (KLOR-CON/EF) 25 MEQ disintegrating tablet, Take 1 tablet (25 mEq total) by mouth 2 (two) times daily., Disp: 60 tablet, Rfl: 1 .  predniSONE (DELTASONE) 5 MG tablet, Take 5 mg by mouth daily., Disp: , Rfl:  .  Probiotic Product (PROBIOTIC DAILY PO), Take 1 each by mouth daily., Disp: , Rfl:  .  vitamin B-12 (CYANOCOBALAMIN) 100 MCG tablet, Take 100 mcg by mouth daily. Reported on 06/20/2015, Disp: , Rfl:

## 2016-12-19 NOTE — Patient Instructions (Addendum)
Pulmonary sarcoidosis: We will arrange a 6 min walk test Continue prednisone for now  Liver sarcoidosis: Continue prednisone for now We will follow up your MRI abdomen  Pleural effusion (fluid in the lung): I'm not sure why you have this We will as radiology to sample the fluid for a test We will arrange for an echocardiogram to assess your heart function  Shortness of breath:  We will get some blood work to look at your hemoglobin value  Follow up in 2 weeks to go over these results

## 2016-12-19 NOTE — Telephone Encounter (Signed)
Spoke with pt and she states she would like a RX for a medication to help with nerves. She also wanted to let you know she had some left lung pain and she forgot to tell you at the last visit. She states she is not hurting at this point but wanted to let you know. She also would like BQ to see if he could squeeze her in by 12/31 because her insurance changes on 1/1 from 40 dollars to 80 dollars every time she comes in.   BQ would you be willing to prescribe something for pt for nerves while taking the scan.   [facility] in [place]

## 2016-12-20 ENCOUNTER — Ambulatory Visit (HOSPITAL_COMMUNITY)
Admission: RE | Admit: 2016-12-20 | Discharge: 2016-12-20 | Disposition: A | Discharge status: Home / Self Care | Payer: BLUE CROSS/BLUE SHIELD | Source: Ambulatory Visit | Attending: Pulmonary Disease | Admitting: Pulmonary Disease

## 2016-12-20 ENCOUNTER — Telehealth: Payer: Self-pay | Admitting: Cardiology

## 2016-12-20 ENCOUNTER — Other Ambulatory Visit: Payer: Self-pay | Admitting: Pulmonary Disease

## 2016-12-20 DIAGNOSIS — J90 Pleural effusion, not elsewhere classified: Secondary | ICD-10-CM | POA: Insufficient documentation

## 2016-12-20 DIAGNOSIS — Z538 Procedure and treatment not carried out for other reasons: Secondary | ICD-10-CM | POA: Insufficient documentation

## 2016-12-20 LAB — LACTATE DEHYDROGENASE: LDH: 223 U/L (ref 120–250)

## 2016-12-20 MED ORDER — LIDOCAINE 2% (20 MG/ML) 5 ML SYRINGE
INTRAMUSCULAR | Status: AC
  Filled 2016-12-20: qty 10

## 2016-12-20 NOTE — Progress Notes (Signed)
  Small pleural effusion on the right. Atelectatic lung tissue seen in the pocket of fluid. Unsafe to proceed with thoracentesis. Risks outweigh benefit at this time.  Tarae Wooden S Shayan Bramhall PA-C 12/20/2016 1:20 PM

## 2016-12-20 NOTE — Telephone Encounter (Signed)
° °  [facility] Pre-operative Risk Assessment    Request for surgical clearance:  What type of surgery is being performed? Thoracentesis  1. When is this surgery scheduled? 12-20-16 @1pm   2. Are there any medications that need to be held prior to surgery and how long? eliquis   3. Practice name and name of physician performing surgery? Internal radiology department   4. What is your office phone and fax number? [phone]  5. Anesthesia type (None, local, MAC, general) ?    Marjean Donna 12/20/2016, 8:11 AM  _________________________________________________________________   (provider comments below)

## 2016-12-20 NOTE — Telephone Encounter (Signed)
Received incoming records from [facility] for upcoming appointment on 12/25/16 @ 10:20am with Dr. Stanford Breed. Records given to [facility] in Medical Records. 12/20/16 ab

## 2016-12-21 NOTE — Telephone Encounter (Signed)
   Chart reviewed as part of pre-operative protocol coverage. Date of request matches date of procedure but do not see this occurred. Will route to pharmacy for further recs.  Charlie Pitter, PA-C 12/21/2016, 3:06 PM

## 2016-12-23 ENCOUNTER — Inpatient Hospital Stay: Admission: RE | Admit: 2016-12-23 | Payer: BLUE CROSS/BLUE SHIELD | Source: Ambulatory Visit

## 2016-12-24 ENCOUNTER — Other Ambulatory Visit (HOSPITAL_COMMUNITY): Payer: BLUE CROSS/BLUE SHIELD

## 2016-12-24 ENCOUNTER — Ambulatory Visit: Payer: BLUE CROSS/BLUE SHIELD | Admitting: Internal Medicine

## 2016-12-24 NOTE — Telephone Encounter (Signed)
Patient with diagnosis of atrial fibrillation on Eliquis for anticoagulation.    Procedure: thoracentisi Date of procedure: ? (Dec 7 listed)  CHADS2-VASc score of  3 (HTN, DM2, female)  CrCl 34.9 Platelet count 251  Per office protocol, patient can hold Eliquis for 3 days prior to procedure.    Patient should restart Eliquis on the evening of procedure or day after, at discretion of procedure MD  .

## 2016-12-25 ENCOUNTER — Ambulatory Visit: Payer: BLUE CROSS/BLUE SHIELD | Admitting: Cardiology

## 2016-12-25 ENCOUNTER — Other Ambulatory Visit: Payer: Self-pay | Admitting: Pulmonary Disease

## 2016-12-25 DIAGNOSIS — J90 Pleural effusion, not elsewhere classified: Secondary | ICD-10-CM

## 2016-12-25 MED ORDER — LORAZEPAM 1 MG PO TABS: ORAL_TABLET | ORAL | 0 refills | Status: DC

## 2016-12-25 NOTE — Telephone Encounter (Signed)
Spoke with patient. She is aware of BQ's recs. Medication has been called into pharmacy. Nothing else needed at time of call.

## 2016-12-25 NOTE — Progress Notes (Signed)
Called spoke with patient, advised of imaging results / recs as stated by BQ.  Pt verbalized her understanding and denied any questions.  Pt will try to come for cxr on Friday 12.15.18 when she comes for her 6MW but is unsure if she's able to with her snow/ice.  Order placed via orders only encounter.

## 2016-12-25 NOTE — Telephone Encounter (Signed)
Spoke with patient regarding IR US Chest results when patient asked for an update on meds for her upcoming MRI on 12.19.18  BQ please advise, thank you.

## 2016-12-25 NOTE — Telephone Encounter (Signed)
Ativan 1mg , take for MRI, dispense 1, refill 0

## 2016-12-25 NOTE — Telephone Encounter (Signed)
   Primary Cardiologist: Dr. Stanford Breed  Chart reviewed as part of pre-operative protocol coverage. Can hold Eliquis for 3 days prior to the procedure and resume the evening of the procedure or the day after at the discretion of the procedure MD.   I will route this recommendation to the requesting party via [facility] fax function and remove from pre-op pool.  Please call with questions.  Erma Heritage, PA-C 12/25/2016, 3:00 PM

## 2016-12-25 NOTE — Progress Notes (Signed)
Per 12.10.18 result note:  Result Notes for IR US CHEST  Notes recorded by Rinaldo Ratel, CMA on 12/25/2016 at 5:04 PM EST Called spoke with patient, advised of imaging results / recs as stated by BQ.  Pt verbalized her understanding and denied any questions.  Pt will try to come for cxr on Friday 12.15.18 when she comes for her 6MW but is unsure if she's able to with her snow/ice.  Order placed via orders only encounter. ------  Notes recorded by Juanito Doom, MD on 12/23/2016 at 3:41 PM EST A, Please let the patient know this showed very little pleural fluid so I would like for her to have a CXR. Thanks, B

## 2016-12-27 ENCOUNTER — Ambulatory Visit (INDEPENDENT_AMBULATORY_CARE_PROVIDER_SITE_OTHER): Payer: BLUE CROSS/BLUE SHIELD | Admitting: *Deleted

## 2016-12-27 ENCOUNTER — Ambulatory Visit (INDEPENDENT_AMBULATORY_CARE_PROVIDER_SITE_OTHER)
Admission: RE | Admit: 2016-12-27 | Discharge: 2016-12-27 | Disposition: A | Discharge status: Home / Self Care | Payer: BLUE CROSS/BLUE SHIELD | Source: Ambulatory Visit | Attending: Pulmonary Disease | Admitting: Pulmonary Disease

## 2016-12-27 ENCOUNTER — Telehealth: Payer: Self-pay | Admitting: Pulmonary Disease

## 2016-12-27 DIAGNOSIS — R0609 Other forms of dyspnea: Secondary | ICD-10-CM

## 2016-12-27 DIAGNOSIS — J90 Pleural effusion, not elsewhere classified: Secondary | ICD-10-CM | POA: Diagnosis not present

## 2016-12-27 DIAGNOSIS — R0600 Dyspnea, unspecified: Secondary | ICD-10-CM

## 2016-12-27 NOTE — Telephone Encounter (Signed)
Letter given to pt in the lobby. Nothing further is needed.

## 2016-12-27 NOTE — Progress Notes (Signed)
SIX MIN WALK 12/27/2016  Medications Acyclovir 200mg , Eiquis 5mg , Cosopt eye drops, Isopto tears, Claritin 10mg , Losartan 50mg , Omega-3 fish oil x2capsules, prednisone 5mg , probiotic @ 1030  Supplimental Oxygen during Test? (L/min) No  Laps 6  Partial Lap (in Meters) 18  Baseline BP (sitting) 130/66  Baseline Heartrate 81  Baseline Dyspnea (Borg Scale) 0.5  Baseline Fatigue (Borg Scale) 1  Baseline SPO2 97  BP (sitting) 180/94  Heartrate 90  Dyspnea (Borg Scale) 2  Fatigue (Borg Scale) 2  SPO2 96  BP (sitting) 182/94  Heartrate 88  SPO2 98  Stopped or Paused before Six Minutes No  Distance Completed 306  Tech Comments: pt completed test at a slow-moderate pace.  she did not pause or stop to rest.  pt did complain of right lung tightness at end of test that is not typical with this level of exertion.  tightness continued to improved at 48mins post.  asked patient to wait in lobby x5-10 mins to ensure tightness continued to improve.  checked on patient 2mins post test and her tightness was still present, but still improved since we had last spoken.  BP checked again at that time and was 144/94.  pt stated her cardiologist and PCP are of BP issues.  pt denied any headache, chest pain, dizziness, hip/leg/calf pain.

## 2016-12-29 ENCOUNTER — Other Ambulatory Visit: Payer: BLUE CROSS/BLUE SHIELD

## 2016-12-30 ENCOUNTER — Telehealth: Payer: Self-pay | Admitting: Pulmonary Disease

## 2016-12-30 NOTE — Telephone Encounter (Signed)
lmom for nowell auth# has been entered into appt [id number] [name]

## 2016-12-30 NOTE — Telephone Encounter (Signed)
Forwarding to PCC's to follow up on.

## 2016-12-31 ENCOUNTER — Telehealth: Payer: Self-pay | Admitting: Pulmonary Disease

## 2016-12-31 NOTE — Telephone Encounter (Signed)
Called and spoke with pt and she stated that she had her EKG rescheduled to Dec 27th at cardiology with Dr. Stanford Breed.  She stated that her MRI that was scheduled at GI she will have to cancel and reschedule to a later date, due to she cannot afford to pay 1,200 out of pocket to have this done at this time.  Will forward to BQ to make him aware.

## 2017-01-01 ENCOUNTER — Other Ambulatory Visit: Payer: BLUE CROSS/BLUE SHIELD

## 2017-01-01 ENCOUNTER — Ambulatory Visit: Payer: BLUE CROSS/BLUE SHIELD | Admitting: Pulmonary Disease

## 2017-01-01 NOTE — Telephone Encounter (Signed)
noted 

## 2017-01-03 ENCOUNTER — Other Ambulatory Visit (HOSPITAL_COMMUNITY): Payer: BLUE CROSS/BLUE SHIELD

## 2017-01-03 ENCOUNTER — Telehealth: Payer: Self-pay | Admitting: Pulmonary Disease

## 2017-01-03 MED ORDER — AZITHROMYCIN 250 MG PO TABS: ORAL_TABLET | ORAL | 0 refills | Status: AC

## 2017-01-03 NOTE — Telephone Encounter (Signed)
Pt is aware of below recommendations and voiced her understanding.  Zpak has been sent to preferred pharmacy Nothing further is needed.

## 2017-01-03 NOTE — Telephone Encounter (Signed)
Spoke with pt. States that she is not feeling well. Reports coughing, wheezing. Cough is producing green mucus. Denies chest tightness, SOB or fever. Symptoms started 1 day ago. Pt has been drinking lemon tea and taking Mucinex with minimal relief. Would like something to be sent in.  MW - please advise. Thanks.

## 2017-01-03 NOTE — Telephone Encounter (Signed)
zpak Double prednisone until better then back to one daily

## 2017-01-06 DIAGNOSIS — K279 Peptic ulcer, site unspecified, unspecified as acute or chronic, without hemorrhage or perforation: Secondary | ICD-10-CM | POA: Insufficient documentation

## 2017-01-06 DIAGNOSIS — I4892 Unspecified atrial flutter: Secondary | ICD-10-CM | POA: Insufficient documentation

## 2017-01-06 DIAGNOSIS — F329 Major depressive disorder, single episode, unspecified: Secondary | ICD-10-CM | POA: Insufficient documentation

## 2017-01-06 DIAGNOSIS — F32A Depression, unspecified: Secondary | ICD-10-CM | POA: Insufficient documentation

## 2017-01-06 DIAGNOSIS — E042 Nontoxic multinodular goiter: Secondary | ICD-10-CM | POA: Insufficient documentation

## 2017-01-06 DIAGNOSIS — H269 Unspecified cataract: Secondary | ICD-10-CM | POA: Insufficient documentation

## 2017-01-06 DIAGNOSIS — D860 Sarcoidosis of lung: Secondary | ICD-10-CM | POA: Insufficient documentation

## 2017-01-06 DIAGNOSIS — I714 Abdominal aortic aneurysm, without rupture, unspecified: Secondary | ICD-10-CM | POA: Insufficient documentation

## 2017-01-06 DIAGNOSIS — F419 Anxiety disorder, unspecified: Secondary | ICD-10-CM | POA: Insufficient documentation

## 2017-01-06 DIAGNOSIS — R011 Cardiac murmur, unspecified: Secondary | ICD-10-CM | POA: Insufficient documentation

## 2017-01-06 DIAGNOSIS — T7840XA Allergy, unspecified, initial encounter: Secondary | ICD-10-CM | POA: Insufficient documentation

## 2017-01-08 ENCOUNTER — Telehealth: Payer: Self-pay | Admitting: Physician Assistant

## 2017-01-08 NOTE — Telephone Encounter (Signed)
Spoke to patient who states she stopped her Bystolic 2 nights ago and feels "so much better" since being off of it.   States she started this medication in September and was having issues with SOB.   States since stopping this medication, her symptoms are improving and continue to do so.   Denies swelling, CP, additional symptoms.   Does report intermittent palpitations but states this is not a change from being on the medication.    Reports Dr. Lake Bells is aware of this as well and is doing some testing on her that we should have access too in the chart.  Patient has appt scheduled for 12/27 with Almyra Deforest PA., patient aware this can be discussed further at appt tomorrow.

## 2017-01-08 NOTE — Telephone Encounter (Signed)
Patient calling, states that she had to stop taking her bystolic medication due to SOB. Patient has been off for two nights and states that SOB is gone now.

## 2017-01-09 ENCOUNTER — Encounter: Payer: Self-pay | Admitting: Physician Assistant

## 2017-01-09 ENCOUNTER — Ambulatory Visit (HOSPITAL_COMMUNITY): Payer: BLUE CROSS/BLUE SHIELD | Attending: Cardiovascular Disease

## 2017-01-09 ENCOUNTER — Other Ambulatory Visit: Payer: Self-pay

## 2017-01-09 ENCOUNTER — Ambulatory Visit: Payer: BLUE CROSS/BLUE SHIELD | Admitting: Physician Assistant

## 2017-01-09 ENCOUNTER — Other Ambulatory Visit: Payer: Self-pay | Admitting: Internal Medicine

## 2017-01-09 VITALS — BP 170/110 | HR 102 | Ht 61.0 in | Wt 123.0 lb

## 2017-01-09 DIAGNOSIS — R05 Cough: Secondary | ICD-10-CM | POA: Diagnosis not present

## 2017-01-09 DIAGNOSIS — I10 Essential (primary) hypertension: Secondary | ICD-10-CM

## 2017-01-09 DIAGNOSIS — I712 Thoracic aortic aneurysm, without rupture, unspecified: Secondary | ICD-10-CM

## 2017-01-09 DIAGNOSIS — D860 Sarcoidosis of lung: Secondary | ICD-10-CM | POA: Diagnosis not present

## 2017-01-09 DIAGNOSIS — I484 Atypical atrial flutter: Secondary | ICD-10-CM | POA: Diagnosis not present

## 2017-01-09 DIAGNOSIS — R0602 Shortness of breath: Secondary | ICD-10-CM

## 2017-01-09 DIAGNOSIS — I420 Dilated cardiomyopathy: Secondary | ICD-10-CM | POA: Diagnosis not present

## 2017-01-09 DIAGNOSIS — N179 Acute kidney failure, unspecified: Secondary | ICD-10-CM

## 2017-01-09 DIAGNOSIS — R058 Other specified cough: Secondary | ICD-10-CM

## 2017-01-09 LAB — ECHOCARDIOGRAM COMPLETE
Height: 61 in
Weight: 1968 oz

## 2017-01-09 MED ORDER — DILTIAZEM HCL 30 MG PO TABS: 30.0000 mg | ORAL_TABLET | Freq: Three times a day (TID) | ORAL | 3 refills | Status: DC

## 2017-01-09 NOTE — Patient Instructions (Signed)
Medication Instructions: START Diltiazem 30 mg tablet three times a day.  If you need a refill on your cardiac medications before your next appointment, please call your pharmacy.   Labwork: Your physician recommends that you have the following today: BMET   Follow-Up: Your physician wants you to follow-up in: 2 weeks with Almyra Deforest, PA. You will receive a reminder letter in the mail two months in advance. If you don't receive a letter, please call our office at [phone] to schedule this follow-up appointment.   Thank you for choosing [facility] at [facility]!!

## 2017-01-09 NOTE — Progress Notes (Signed)
Cardiology Office Note    Date:  01/11/2017   ID:  SKAI LICKTEIG, DOB 1960/09/22, MRN [id number]  PCP:  Cassandria Anger, MD  Cardiologist:  Dr. Stanford Breed Electrophysiologist: Dr. Curt Bears  Chief Complaint  Patient presents with  . Follow-up    seen for Dr. Stanford Breed    History of Present Illness:  [name] is a 56 y.o. female with PMH of atrial flutter on eliquis (previously flecainide), HTN, pulmonary sarcoidosis, and PUD.  Echocardiogram in January 2018 showed normal LV function, mild to moderate MR. Due to atypical atrial flutter, Dr. Curt Bears did not recommend ablation.  She was started on Eliquis on 01/24/2016.  Patient was started on flecainide and scheduled for DC cardioversion, fortunately she converted prior to the cardioversion.  GXT in March 2018 showed no chest pain, no ST changes and no stress-induced ventricular tachycardia.  Chest CT in June 2018 showed progression of pulmonary sarcoidosis.  There was also a 4.2 cm thoracic aortic aneurysm.  Cardiac MRI in July 2018 showed no evidence of sarcoid, EF 58%, moderate LAE, mild right atrial enlargement, moderate MR and mild TR.  Her amlodipine was decreased due to pedal edema.  She was seen by Dr. Stanford Breed on 09/25/2016, she self discontinued her flecainide is due to palpitation.  Her Bystolic was increased to 7.5 mg daily.  A 24-hour heart monitor obtained on 10/14/2016 showed sinus rhythm with first-degree AV block, PACs, PVCs, couplet, nonsustained VT and atrial flutter with RVR.  Patient was sent back to EP service again for consideration of antiarrhythmic therapy.  She was seen by Dr. Curt Bears on 10/23/2016, she was instructed to start on propafenone.  It was recommended for the patient to have another GXT 10 days after starting propafenone.  Patient contact cardiology 2 weeks later explaining that she never started on propafenone due to concern of possible side effect.  Repeat GXT canceled.  She also stopped her bisoprolol  due to shortness of breath.   She was last seen by Dr. Lake Bells of pulmonology service on 12/19/2016.  Apparently there was a CT scan at [facility] that showed worsening of her vital capacity and worsening fibrosis in the lung.  Due to pleural effusion seen on the recent CT, Dr. Lake Bells ordered echocardiogram to assess overall ejection fraction.  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  Recently, patient completed a 5 day course of zpack antibiotics for productive cough.  She also underwent a 6-minute walk test on 12/27/2016 for eval of pulm fibrosis.  Patient presents today for evaluation.  She remains short of breath, however improved after supposedly discontinued Bystolic.  She is unwilling to restart Bystolic at this time.  On physical exam, she continued to have significant expiratory wheezing and bilateral rhonchi.  Fortunately, I do not hear significant diminished breath sounds in the lung base to suggest large pleural effusion.  I also do not hear significant crackles either.  Her heart rate is quite fast, I have obtained a EKG strip which does show she has went back into atrial flutter with RVR.  I discussed with Dr. Stanford Breed, I also had a long conversation with the patient.  She is intolerant to Bystolic, I would not recommend other beta blockers given her active wheezing and a significant shortness of breath, she has side effect associated with diltiazem and verapamil.  Looking at her previous medication list, it looks like she was prescribed 180 mg daily of long-acting diltiazem in February 2018.  She  said she felt very fatigued afterward.  I wonder if 180mg  diltiazem was dropping her blood pressure too much.  After discussing various options, we decided to try the diltiazem again however at much lower doses.  She will take short acting diltiazem 30 mg every 8 hours for rate control.  I will see her back in 2 weeks, if her heart rate remains uncontrolled, we need to restart her on  propafenone with follow-up GXT.  I talked to her regarding correlation between uncontrolled heart rate and pleural effusion, she understands she will need to have better control of her heart rate.   Past Medical History:  Diagnosis Date  . AAA (abdominal aortic aneurysm) (HCC)    a. 4.2 cm by CT in 06/2016  . Allergy   . Anxiety   . Asthma   . Atrial flutter (HCC)    a. on Flecainide and Eliquis  . Cataract   . Depression    pt states no depression 07-11-15  . Glaucoma   . Heart murmur    as child only  . Herpes simplex   . Hypertension   . Multiple thyroid nodules   . Osteoarthritis   . Osteoporosis   . PUD (peptic ulcer disease)   . Pulmonary sarcoidosis ([place])   . Tubular adenoma of colon 07/2015  . Vitamin D deficiency     Past Surgical History:  Procedure Laterality Date  . BUNIONECTOMY     x2  . CATARACT EXTRACTION Right   . DENTAL SURGERY    . DILATION AND CURETTAGE OF UTERUS    . fibroid tumor removal    . GLAUCOMA SURGERY     Right eye  . MYOMECTOMY      Current Medications: Outpatient Medications Prior to Visit  Medication Sig Dispense Refill  . acyclovir (ZOVIRAX) 200 MG capsule Take 200 mg by mouth every other day.     [name] apixaban (ELIQUIS) 5 MG TABS tablet Take 1 tablet (5 mg total) by mouth 2 (two) times daily. 60 tablet 3  . BIOTIN PO Take by mouth daily.    [name] glucose blood test strip 1 each by Other route as directed. 100 each 3  . hydroxypropyl methylcellulose / hypromellose (ISOPTO TEARS / GONIOVISC) 2.5 % ophthalmic solution Place 1 drop into the right eye daily.    [name] loratadine (CLARITIN) 10 MG tablet Take 10 mg by mouth daily.    [name] LORazepam (ATIVAN) 1 MG tablet Take before MRI scan for nerves 1 tablet 0  . losartan (COZAAR) 100 MG tablet Take 0.5 tablets (50 mg total) by mouth daily. Must keep appt on 11/29/16 90 tablet 0  . losartan (COZAAR) 100 MG tablet Take 1 tablet (100 mg total) by mouth daily. 90 tablet 1  . Omega-3 300 MG CAPS Take 2  capsules by mouth daily.     . ONE TOUCH LANCETS MISC 1 each by Does not apply route as directed. 100 each 3  . predniSONE (DELTASONE) 5 MG tablet Take 5 mg by mouth daily.    . Probiotic Product (PROBIOTIC DAILY PO) Take 1 each by mouth daily.    . vitamin B-12 (CYANOCOBALAMIN) 100 MCG tablet Take 100 mcg by mouth daily. Reported on 06/20/2015    . dorzolamide-timolol (COSOPT) 22.3-6.8 MG/ML ophthalmic solution Place 1 drop into the left eye daily.   12  . nebivolol (BYSTOLIC) 5 MG tablet Take 7.5 mg by mouth daily.    . potassium bicarbonate (KLOR-CON/EF) 25 MEQ disintegrating tablet Take  1 tablet (25 mEq total) by mouth 2 (two) times daily. 60 tablet 1   No facility-administered medications prior to visit.      Allergies:   Doxycycline; Amoxicillin-pot clavulanate; Amoxicillin-pot clavulanate; Cardizem [diltiazem hcl]; Carvedilol; Cefdinir; Cephalexin; Hydrochlorothiazide; Influenza vaccines; Prednisone; Sulfamethoxazole; Sulfasalazine; Sulfonamide derivatives; and Verapamil   Social History   Socioeconomic History  . Marital status: Divorced    Spouse name: None  . Number of children: None  . Years of education: None  . Highest education level: None  Social Needs  . Financial resource strain: None  . Food insecurity - worry: None  . Food insecurity - inability: None  . Transportation needs - medical: None  . Transportation needs - non-medical: None  Occupational History  . None  Tobacco Use  . Smoking status: Never Smoker  . Smokeless tobacco: Never Used  Substance and Sexual Activity  . Alcohol use: No    Alcohol/week: 0.0 oz  . Drug use: No  . Sexual activity: No    Birth control/protection: Abstinence  Other Topics Concern  . None  Social History Narrative   Single/divorced; no children; she has a friend now   Never smoked   Positive history of passive tobacco smoke exposure.   Exercise- up to 4 times a week   Caffeine- 1-2 per week     Family History:  The  patient's family history includes Heart Problems in her father; Hypertension in her father; Lung cancer in her mother; Stomach cancer in her cousin; Stroke in her father.   ROS:   Please see the history of present illness.    ROS All other systems reviewed and are negative.   PHYSICAL EXAM:   VS:  BP (!) 170/110   Pulse (!) 102   Ht 5\' 1"  (1.549 m)   Wt 123 lb (55.8 kg)   BMI 23.24 kg/m    GEN: Well nourished, well developed, in no acute distress  HEENT: normal  Neck: no JVD, carotid bruits, or masses Cardiac: Irregular, tachycardic; no murmurs, rubs, or gallops,no edema  Respiratory: Bilateral wheezing and rhonchi GI: soft, nontender, nondistended, + BS MS: no deformity or atrophy  Skin: warm and dry, no rash Neuro:  Alert and Oriented x 3, Strength and sensation are intact Psych: euthymic mood, full affect  Wt Readings from Last 3 Encounters:  01/09/17 123 lb (55.8 kg)  12/19/16 128 lb (58.1 kg)  12/09/16 127 lb (57.6 kg)      Studies/Labs Reviewed:   EKG:  EKG is ordered today.  The ekg ordered today demonstrates EKG strip showed atrial flutter with RVR  Recent Labs: 05/09/2016: TSH 2.73 12/09/2016: ALT 57 12/19/2016: Hemoglobin 15.6; Platelets 251.0; Pro B Natriuretic peptide (BNP) 277.0 01/09/2017: BUN 24; Creatinine, Ser 1.27; Potassium 3.2; Sodium 144   Lipid Panel    Component Value Date/Time   CHOL 150 01/25/2013 0840   TRIG 55.0 01/25/2013 0840   TRIG 86 01/02/2006 0927   HDL 38.90 (L) 01/25/2013 0840   CHOLHDL 4 01/25/2013 0840   VLDL 11.0 01/25/2013 0840   LDLCALC 100 (H) 01/25/2013 0840    Additional studies/ records that were reviewed today include:   Echo 01/09/2017 LV EF: 60% -   65%  ------------------------------------------------------------------- Indications:      Shortness of Breath (R06.02).  ------------------------------------------------------------------- History:   PMH:  Asthma, Sarcoidosis, AAA.  Murmur.  Atrial flutter.   Risk factors:  Family history of coronary artery disease. Hypertension.  ------------------------------------------------------------------- Study Conclusions  - Left ventricle:  The cavity size was normal. There was mild   concentric hypertrophy. Systolic function was normal. The   estimated ejection fraction was in the range of 60% to 65%. Wall   motion was normal; there were no regional wall motion   abnormalities. - Left atrium: The atrium was moderately to severely dilated. - Right atrium: The atrium was moderately to severely dilated. - Pulmonary arteries: Systolic pressure was moderately increased.   PA peak pressure: 48 mm Hg (S). - Pericardium, extracardiac: A trivial pericardial effusion was   identified.    ASSESSMENT:    1. Atypical atrial flutter ([place])   2. Essential hypertension   3. AKI (acute kidney injury) ([place])   4. Pulmonary sarcoidosis ([place])   5. Thoracic aortic aneurysm without rupture ([place])   6. Productive cough      PLAN:  In order of problems listed above:  1. Atypical atrial flutter with RVR: Compliant with eliquis. Not an ablation candidate, she self discontinued flecainide and bisoprolol. She has went back into atrial flutter with RVR.  I discussed the case with Dr. Stanford Breed, she has previous intolerance of diltiazem and verapamil as well.  Given her active wheezing, she is not a candidate for nonselective beta-blocker.  I think it is possible that some of her previous side effect associated with diltiazem was due to the dosage, after discussing potential side effects, we decided to restart her diltiazem and low-dose 30 mg every 8 hours.  I will see her back in 2 weeks, if her heart rate is still uncontrolled, we need to discuss restarting propafenone at the subsequent GXT.  Also there is a question regarding their she would be a candidate for 2.5 mg twice daily of Eliquis, I will obtain a basic metabolic panel to assess her renal function.  Last  creatinine was 1.6 however this was in the setting of kidney stone seen on CT.  Her baseline creatinine should be around 1.3.  If so, she should be on 5 mg twice daily of Eliquis.  2. URI with productive cough: She continued to have significant wheezing and rhonchi on physical exam.  However she says her symptoms improved after recent azithromycin   3. pulmonary sarcoidosis: Recent image at [facility] showed possible progression.  She is pending MRI of the liver.    4. Thoracic aortic aneurysm: CT of the chest obtained on 07/04/2016 showed a 4.2 cm stable thoracic aneurysm, this is less well seen on repeat imaging on 01/12/2017.  Need follow-up CTA June 2019  5. Hypertension: Blood pressure elevated today, however she is also short of breath with significant productive cough.  I have restarted her on 30 mg 3 times daily of acting diltiazem for her rate control.  6. AKI: Previous creatinine was 1.6 in the setting of kidney stone, repeat basic metabolic panel    Medication Adjustments/Labs and Tests Ordered: Current medicines are reviewed at length with the patient today.  Concerns regarding medicines are outlined above.  Medication changes, Labs and Tests ordered today are listed in the Patient Instructions below. Patient Instructions  Medication Instructions: START Diltiazem 30 mg tablet three times a day.  If you need a refill on your cardiac medications before your next appointment, please call your pharmacy.   Labwork: Your physician recommends that you have the following today: BMET   Follow-Up: Your physician wants you to follow-up in: 2 weeks with Almyra Deforest, PA. You will receive a reminder letter in the mail two months in advance. If you  don't receive a letter, please call our office at [phone] to schedule this follow-up appointment.   Thank you for choosing [facility] at [facility], [place]  01/11/2017 10:43 AM    [facility], [place], [place]  [address] Phone: [phone]; Fax: [phone]

## 2017-01-10 LAB — BASIC METABOLIC PANEL
BUN / CREAT RATIO: 19 (ref 9–23)
BUN: 24 mg/dL (ref 6–24)
CHLORIDE: 102 mmol/L (ref 96–106)
CO2: 27 mmol/L (ref 20–29)
Calcium: 9.8 mg/dL (ref 8.7–10.2)
Creatinine, Ser: 1.27 mg/dL — ABNORMAL HIGH (ref 0.57–1.00)
GFR, EST AFRICAN AMERICAN: 54 mL/min/{1.73_m2} — AB (ref >59)
GFR, EST NON AFRICAN AMERICAN: 47 mL/min/{1.73_m2} — AB (ref >59)
Glucose: 138 mg/dL — ABNORMAL HIGH (ref 65–99)
POTASSIUM: 3.2 mmol/L — AB (ref 3.5–5.2)
Sodium: 144 mmol/L (ref 134–144)

## 2017-01-11 ENCOUNTER — Encounter: Payer: Self-pay | Admitting: Physician Assistant

## 2017-01-15 ENCOUNTER — Telehealth: Payer: Self-pay | Admitting: Internal Medicine

## 2017-01-15 ENCOUNTER — Telehealth: Payer: Self-pay | Admitting: *Deleted

## 2017-01-15 ENCOUNTER — Telehealth: Payer: Self-pay | Admitting: Physician Assistant

## 2017-01-15 ENCOUNTER — Other Ambulatory Visit: Payer: Self-pay | Admitting: *Deleted

## 2017-01-15 DIAGNOSIS — E876 Hypokalemia: Secondary | ICD-10-CM

## 2017-01-15 DIAGNOSIS — Z79899 Other long term (current) drug therapy: Secondary | ICD-10-CM

## 2017-01-15 MED ORDER — POTASSIUM CHLORIDE ER 10 MEQ PO TBCR: 10.0000 meq | EXTENDED_RELEASE_TABLET | Freq: Every day | ORAL | 3 refills | Status: DC

## 2017-01-15 NOTE — Telephone Encounter (Signed)
Pt c/o medication issue:  1. Name of Medication:  diltiazem (CARDIZEM) 30 MG tablet 2. How are you currently taking this medication (dosage and times per day)? Take 1 tablet (30 mg total) by mouth 3 (three) times daily.  3. Are you having a reaction (difficulty breathing--STAT)? no  4. What is your medication issue? Vomiting, she threw up her 7am medication and she want to know if she should retake her morning medications

## 2017-01-15 NOTE — Telephone Encounter (Signed)
I agree, missing a single dose due to vomiting is ok.

## 2017-01-15 NOTE — Telephone Encounter (Signed)
-----   Message from [place], [place] sent at 01/15/2017 12:17 PM EST ----- Kidney function improved. Potassium a little low, start on K-Dur 10 meq daily, recheck in 1 week.

## 2017-01-15 NOTE — Telephone Encounter (Signed)
Copied from [place] [id number]. Topic: Inquiry >> Jan 15, 2017  3:25 PM [name], NT wrote: Reason for CRM: patient wants to let Dr. Alain Marion know that Dr. Sharlot Gowda prescribed her antibiotics on 01/03/17. And she was wanting to know if Dr. Alain Marion could prescribed something else because she is still coughing and has a lot of mucus coming up. Wants something that can dry the cold up. States the infection is gone but she is still coughing. Please contact patient back.

## 2017-01-15 NOTE — Telephone Encounter (Signed)
Patient aware of results and recommendations, verbalized understanding.   rx sent to pharmacy and order placed for repeat BMET.  OV 1/10 with Almyra Deforest PA, will have lab work at this time.     Patient states a change in eliquis was pending her lab results.  No mention of dose change in results.  Advised to continue at current dosage at this time.  OV 12/27: 1. Also there is a question regarding their she would be a candidate for 2.5 mg twice daily of Eliquis, I will obtain a basic metabolic panel to assess her renal function.  Last creatinine was 1.6 however this was in the setting of kidney stone seen on CT.  Her baseline creatinine should be around 1.3.  If so, she should be on 5 mg twice daily of Eliquis.   Advised I would route to PA for review and return call with recommendations.  patinet verbalized understanding.

## 2017-01-15 NOTE — Telephone Encounter (Signed)
Spoke to patient Betty Deforest PA agreed with missing 7:00 am Diltiazem dose.Stated she is feeling better.

## 2017-01-15 NOTE — Telephone Encounter (Signed)
Returned call to patient she stated she is getting over a cold.Stated she vomited this morning due to a lot of thick phlegm and she vomited her 7:00 am Diltiazem.Advised ok not to retake just take other doses as prescribed.Stated she wanted Almyra Deforest PA to know.Advised I will let him know.

## 2017-01-16 ENCOUNTER — Telehealth: Payer: Self-pay | Admitting: Pulmonary Disease

## 2017-01-16 MED ORDER — PREDNISONE 10 MG PO TABS: ORAL_TABLET | ORAL | 0 refills | Status: DC

## 2017-01-16 NOTE — Telephone Encounter (Signed)
F/U call:  Patient calling, in regards to lab results. Patient would like to know if she can decrease her Eliquis dosage down to 1 pill

## 2017-01-16 NOTE — Telephone Encounter (Signed)
Rx sent in to [facility].  ATC pt, no answer. Left message for pt to call back.

## 2017-01-16 NOTE — Telephone Encounter (Signed)
Pt is aware of below message and voiced her understanding.  Pt states she has prednisone on hand, as she takes 5mg  daily. Pt states she will have enough of prednisone to get her thru taper.  She has hx of afib, and is concerned about starting with 40mg  of prednisone. Pt states she typically starts with 30mg  and tapers down.  Pt is concerned about taper evaluating her BP, as it has been slightly evaluated recently. Pt states BP was 140/100 three days ago, she has not checked BP since.  Pt states she started a new medication for afib recently, that seems to be helping.   CY please advise. Thanks.

## 2017-01-16 NOTE — Telephone Encounter (Signed)
Pt is calling back [phone]

## 2017-01-16 NOTE — Telephone Encounter (Signed)
Pt is aware of below message and voiced her understanding.  Rx for prednisone taper has been cancelled at [facility].  Nothing further is needed.

## 2017-01-16 NOTE — Telephone Encounter (Signed)
Called and spoke with pt.  Pt states she completed zpak on 01/07/17. Pt states she did have some improvement but symptoms did not completely subside.  Pt reports of dry cough at times prod cough yellow to green mucus- cough worsens at night, occ wheezing & nasal drainage clear in color Pt states she has vomit three times, due to coughing.  Denies fever, chills, sweats or body aches.  Pt taken mucinex with mild improvement.   CY please advise, as BQ is not available.   Current Outpatient Medications on File Prior to Visit  Medication Sig Dispense Refill  . acyclovir (ZOVIRAX) 200 MG capsule Take 200 mg by mouth every other day.     [name] apixaban (ELIQUIS) 5 MG TABS tablet Take 1 tablet (5 mg total) by mouth 2 (two) times daily. 60 tablet 3  . BIOTIN PO Take by mouth daily.    [name] diltiazem (CARDIZEM) 30 MG tablet Take 1 tablet (30 mg total) by mouth 3 (three) times daily. 270 tablet 3  . glucose blood test strip 1 each by Other route as directed. 100 each 3  . hydroxypropyl methylcellulose / hypromellose (ISOPTO TEARS / GONIOVISC) 2.5 % ophthalmic solution Place 1 drop into the right eye daily.    [name] loratadine (CLARITIN) 10 MG tablet Take 10 mg by mouth daily.    [name] LORazepam (ATIVAN) 1 MG tablet Take before MRI scan for nerves 1 tablet 0  . losartan (COZAAR) 100 MG tablet Take 0.5 tablets (50 mg total) by mouth daily. Must keep appt on 11/29/16 90 tablet 0  . losartan (COZAAR) 100 MG tablet Take 1 tablet (100 mg total) by mouth daily. 90 tablet 1  . Omega-3 300 MG CAPS Take 2 capsules by mouth daily.     . ONE TOUCH LANCETS MISC 1 each by Does not apply route as directed. 100 each 3  . potassium chloride (K-DUR) 10 MEQ tablet Take 1 tablet (10 mEq total) by mouth daily. 30 tablet 3  . predniSONE (DELTASONE) 5 MG tablet Take 5 mg by mouth daily.    . Probiotic Product (PROBIOTIC DAILY PO) Take 1 each by mouth daily.    . vitamin B-12 (CYANOCOBALAMIN) 100 MCG tablet Take 100 mcg by mouth daily.  Reported on 06/20/2015     No current facility-administered medications on file prior to visit.     Allergies  Allergen Reactions  . Doxycycline Shortness Of Breath  . Amoxicillin-Pot Clavulanate     REACTION: GI symptoms  . Amoxicillin-Pot Clavulanate Other (See Comments)    REACTION: GI symptoms  . Cardizem [Diltiazem Hcl] Other (See Comments)    HAs Pt can take Norvasc HAs Pt can take Norvasc  . Carvedilol Other (See Comments)    REACTION: tired REACTION: tired  . Cefdinir Diarrhea  . Cephalexin Diarrhea    REACTION: unspecified  . Hydrochlorothiazide Other (See Comments)    Dropped BP and caused hair loss   . Influenza Vaccines Other (See Comments)    Allergic to flu vaccinations per pt.  Soreness and "cold symptoms" per pt Allergic to flu vaccinations per pt.  Soreness and "cold symptoms" per pt  . Prednisone Hypertension and Other (See Comments)  . Sulfamethoxazole     Other reaction(s): Unknown REACTION: unspecified REACTION: unspecified  . Sulfasalazine     Other reaction(s): Unknown  . Sulfonamide Derivatives   . Verapamil Other (See Comments)    fatigue fatigue

## 2017-01-16 NOTE — Telephone Encounter (Signed)
Offer prednisone 10 mg, # 20, 4 X 2 DAYS, 3 X 2 DAYS, 2 X 2 DAYS, 1 X 2 DAYS  

## 2017-01-16 NOTE — Telephone Encounter (Signed)
Maybe she would do better to take prednisone 15 mg daily, since she takes 5 mg daily anyway. Try 15 mg daily through the weekend, then she can let us know how she's doing next week.

## 2017-01-16 NOTE — Telephone Encounter (Signed)
Pt calling this morning stating she is showing symptoms of bronchitis. And would like another antibiotic. Her cough isnt as bad as 12/27. But the cough is constant and shes blowing her nose a lot. Pt [facility] meds called [facility] [address] [facility] [phone] (pharmacy number)

## 2017-01-17 MED ORDER — AZITHROMYCIN 250 MG PO TABS: ORAL_TABLET | ORAL | 0 refills | Status: DC

## 2017-01-17 NOTE — Telephone Encounter (Signed)
Pt informed rx was sent. Pt stated that her pulminologist is having her to increase her prednisone to 15 mg.

## 2017-01-17 NOTE — Telephone Encounter (Signed)
Ok Z pac OV if not well Thx

## 2017-01-17 NOTE — Telephone Encounter (Signed)
Her eliquis is 5mg  twice a day. She should not take 5mg  daily as it is a 12 hour drug.

## 2017-01-21 ENCOUNTER — Ambulatory Visit: Payer: BLUE CROSS/BLUE SHIELD | Admitting: Internal Medicine

## 2017-01-23 ENCOUNTER — Ambulatory Visit: Payer: BLUE CROSS/BLUE SHIELD | Admitting: Physician Assistant

## 2017-01-23 ENCOUNTER — Telehealth: Payer: Self-pay | Admitting: Pulmonary Disease

## 2017-01-23 NOTE — Telephone Encounter (Signed)
[name] prescribed prednisone taper on 01/16/17 and was instructed to let us know how she was feeling. Pt wanted to make [name] aware that she is feeling so much better, and wanted to say Thank you.   Will route to [name] as an [place].

## 2017-01-26 ENCOUNTER — Telehealth: Payer: Self-pay | Admitting: Cardiology

## 2017-01-26 NOTE — Telephone Encounter (Signed)
[name] called to report that she has had some blood from her nostril and some dark mucous in her nose for the last several days. She was told that being on cardizem and Eliquis can increase your chance of bleeding and she wondered if she should stop the cardizem. She was just put on cardizem on 12/27 by Almyra Deforest for afib with RVR. She says that she didn't have bleeding in the nose prior to being on cardizem. Upon questioning, she does not seem to be having active nose bleeds, but just some blood around her nostril. She says that she had had a URI recently but her nose is no longer stuffy or running and she has not been blowing it.  I advised her to use some nasal moisturizing spray and maybe use a humidifier at night and continue the cardizem for now as I would not want her to have trouble with afib with RVR.  She seems to want me to say it is OK to stop the diltiazem, but I have asked her to keep taking it and monitor her bleeding for a few more days. I advised her to call the office is she develops bleeding that won't stop.   I will route this to Almyra Deforest for any further recommendations.   Daune Perch, AGNP-C [facility] 01/26/2017  4:49 PM

## 2017-01-27 ENCOUNTER — Ambulatory Visit: Payer: BLUE CROSS/BLUE SHIELD | Admitting: Cardiology

## 2017-01-27 NOTE — Telephone Encounter (Signed)
Agree with continuing diltiazem. [name] has difficulty taking multiple rate control medications include bisoprolol and verapmil. I could not use other nonselective beta blocker due to breathing issue and active wheezing at the time. She self discontinued both of bisoprolol and flecainide. When I saw [name] last time, she was in afib with RVR. I managed to convince her to take diltiazem. Personally, I doubt the combination of diltiazem and eliquis will increase the bleeding risk too much compare to eliquis alone. Therefore, I too, would recommend continue diltiazem.   Thank you.   Hilbert Corrigan PA Pager: [phone]

## 2017-01-28 ENCOUNTER — Ambulatory Visit: Payer: BLUE CROSS/BLUE SHIELD | Admitting: Pulmonary Disease

## 2017-01-31 ENCOUNTER — Telehealth: Payer: Self-pay | Admitting: Physician Assistant

## 2017-01-31 NOTE — Telephone Encounter (Signed)
lmtcb

## 2017-01-31 NOTE — Telephone Encounter (Signed)
New message ° °Pt verbalized that she is returning call for RN °

## 2017-01-31 NOTE — Telephone Encounter (Signed)
I seen your note about whether the patient should continue to take the Diltiazem but she thinks it causes nose bleeds and the pharmacist at the drug store told her the combo of Diltiazem and Eliquis could cause nose bleeds.  Patient states she decreased her dose on the Diltiazem to bid from tid and she not had any nose bleeds since decrease. Noticed that today she had 1 drop of blood from her left nostril just one time today. Is this fine?  Patient wanted to let us know that she lost insurance until next month, February, other wise she would have kept her appt for the 10th of this month.

## 2017-02-01 NOTE — Telephone Encounter (Signed)
I am covering Hao's box. Generally blood thinners should not cause bleeding unless there is an underlying issue (they reveal bleeding if the patient has an abnormality, such as in the vasculature in their nose). Nosebleeds are frequently an easy fix with in-office treatment by ENT - please refer if she would like. Dayna Dunn PA-C

## 2017-02-04 NOTE — Telephone Encounter (Signed)
Spoke with patient and she said she is has rashes from taking Diltiazem so she has stopped this med. Noticed first Frirday and second rash on Saturday. Patient states "the nose bleeds have stopped."   Ok to leave a detailed message on phone.

## 2017-02-04 NOTE — Telephone Encounter (Signed)
lmtcb

## 2017-02-04 NOTE — Telephone Encounter (Signed)
Follow up     Please call patient , okay to leave detailed message

## 2017-02-05 NOTE — Telephone Encounter (Signed)
Will forward to Hao's box for his input as he's been checking this regularly each morning. If not addressed, will take a look later today.

## 2017-02-05 NOTE — Telephone Encounter (Signed)
This is a very difficult situation. [name] was in atypical aflutter with RVR of unknown duration when I last saw her as she self discontinued flecainide and bisoprolol due to possible side effect. She likely has been having tachycardia for awhile, but hasn't been aggressively seeking medical attention due to lack of cardiac awareness. She can't take nonselective beta blocker due to active wheezing. She is allergic to verapmil. There is not many options left for rate control. Dr. Curt Bears recommended propafenone (Rhythmol) with subsequent GXT, but she did not want to try it due to side effect.   At this point, there is very limited option general cardiology can offer. [name], patient cancelled her recent appt with Dr. Curt Bears, please see if another appt can be scheduled with EP team Dr. Curt Bears in 1-2 weeks instead of seeing me in a week. I feel at this point, EP service maybe able to offer her more. One of the potential complication from prolonged uncontrolled HR is weakening of the heart pumping function, she need to monitor for any worsening leg swelling, orthopnea (SOB when lying down) and paroxysmal nocturnal dyspnea (waking up feeling SOB).

## 2017-02-06 ENCOUNTER — Telehealth: Payer: Self-pay

## 2017-02-06 ENCOUNTER — Telehealth: Payer: Self-pay | Admitting: Internal Medicine

## 2017-02-06 NOTE — Telephone Encounter (Signed)
Patient called and stated her eye is blood shot red, she just noticed it about 45 minutes ago, went to use the rest room and noticed her eye was red. No discomfort. Patient stated she called her eye doctor and was told that the Eliqus could cause blood shot eye.  Advised patient to contact eye doctor. She stated she does not have insurance so she will make an appt once her insurance is reinstated. Advised patient to keep a watch on her eye. She voiced understanding.

## 2017-02-07 ENCOUNTER — Telehealth: Payer: Self-pay | Admitting: Pulmonary Disease

## 2017-02-07 ENCOUNTER — Telehealth: Payer: Self-pay | Admitting: Internal Medicine

## 2017-02-07 MED ORDER — AZITHROMYCIN 250 MG PO TABS: ORAL_TABLET | ORAL | 0 refills | Status: DC

## 2017-02-07 NOTE — Telephone Encounter (Signed)
Spoke with patient. RX has been called in. Nothing else needed at time of call.

## 2017-02-07 NOTE — Telephone Encounter (Signed)
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  Pt states she has been having a productive cough but has not looked at the mucus to see what color it is, sinus congestion, occ chills/body aches and off and on fever.  Pt does not have insurance until 02/14/17 and is wanting to know if we can call her in a Rx.  Pt was recently prescribed zpak by Dr. Alain Marion on 01/17/17 for some of the similar symptoms which she stated really helped.  Dr. Lake Bells, please advise on the above.  Thanks!

## 2017-02-07 NOTE — Telephone Encounter (Signed)
Please Rx Zpack

## 2017-02-07 NOTE — Telephone Encounter (Signed)
Copied from [place]. Topic: Quick Communication - Rx Refill/Question >> Feb 07, 2017 10:40 AM Antonieta Iba C wrote: Self   Medication: Arlyss Repress   Has the patient contacted their pharmacy? no   (Agent: If no, request that the patient contact the pharmacy for the refill.)   Preferred Pharmacy (with phone number or street name): [id number] [place], [place]: Please be advised that RX refills may take up to 3 business days. We ask that you follow-up with your pharmacy.

## 2017-02-07 NOTE — Telephone Encounter (Signed)
[name] 02/07/2017 02:47 PM  Patient states she can not come in due to no insurance, requesting to let dr know and see if something can be called in till then. Please advise

## 2017-02-07 NOTE — Telephone Encounter (Signed)
Left patient vm to call back to schedule acute visit.

## 2017-02-07 NOTE — Telephone Encounter (Signed)
Refill for antibiotic has been denied. Per chart MD gave rx for Zpac back on 01/17/17, and stated if no better after completion will need to make OV. Please contact pt for appt for sat clinic or MD next available.Marland KitchenJohny Moore

## 2017-02-10 NOTE — Telephone Encounter (Signed)
Pulmonology sent EB

## 2017-02-12 ENCOUNTER — Ambulatory Visit: Payer: Self-pay | Admitting: Physician Assistant

## 2017-02-12 ENCOUNTER — Ambulatory Visit: Payer: BLUE CROSS/BLUE SHIELD | Admitting: Cardiology

## 2017-02-12 NOTE — Telephone Encounter (Signed)
lmtcb

## 2017-02-19 ENCOUNTER — Encounter: Payer: Self-pay | Admitting: Internal Medicine

## 2017-02-19 ENCOUNTER — Other Ambulatory Visit (INDEPENDENT_AMBULATORY_CARE_PROVIDER_SITE_OTHER): Payer: BLUE CROSS/BLUE SHIELD

## 2017-02-19 ENCOUNTER — Ambulatory Visit: Payer: BLUE CROSS/BLUE SHIELD | Admitting: Internal Medicine

## 2017-02-19 DIAGNOSIS — I4891 Unspecified atrial fibrillation: Secondary | ICD-10-CM

## 2017-02-19 DIAGNOSIS — I484 Atypical atrial flutter: Secondary | ICD-10-CM

## 2017-02-19 DIAGNOSIS — R7989 Other specified abnormal findings of blood chemistry: Secondary | ICD-10-CM

## 2017-02-19 LAB — BASIC METABOLIC PANEL
BUN: 19 mg/dL (ref 6–23)
CALCIUM: 9.6 mg/dL (ref 8.4–10.5)
CO2: 33 mEq/L — ABNORMAL HIGH (ref 19–32)
CREATININE: 1.33 mg/dL — AB (ref 0.40–1.20)
Chloride: 97 mEq/L (ref 96–112)
GFR: 52.78 mL/min — AB (ref >60.00)
Glucose, Bld: 143 mg/dL — ABNORMAL HIGH (ref 70–99)
Potassium: 3.6 mEq/L (ref 3.5–5.1)
Sodium: 136 mEq/L (ref 135–145)

## 2017-02-19 LAB — CBC WITH DIFFERENTIAL/PLATELET
BASOS PCT: 1.2 % (ref 0.0–3.0)
Basophils Absolute: 0.1 10*3/uL (ref 0.0–0.1)
EOS PCT: 0.6 % (ref 0.0–5.0)
Eosinophils Absolute: 0.1 10*3/uL (ref 0.0–0.7)
HCT: 48.4 % — ABNORMAL HIGH (ref 36.0–46.0)
Hemoglobin: 16.3 g/dL — ABNORMAL HIGH (ref 12.0–15.0)
LYMPHS ABS: 1.3 10*3/uL (ref 0.7–4.0)
Lymphocytes Relative: 13.1 % (ref 12.0–46.0)
MCHC: 33.7 g/dL (ref 30.0–36.0)
MCV: 91.5 fl (ref 78.0–100.0)
MONO ABS: 1.1 10*3/uL — AB (ref 0.1–1.0)
Monocytes Relative: 11.1 % (ref 3.0–12.0)
NEUTROS ABS: 7.4 10*3/uL (ref 1.4–7.7)
NEUTROS PCT: 74 % (ref 43.0–77.0)
Platelets: 417 10*3/uL — ABNORMAL HIGH (ref 150.0–400.0)
RBC: 5.29 Mil/uL — ABNORMAL HIGH (ref 3.87–5.11)
RDW: 15.2 % (ref 11.5–15.5)
WBC: 10 10*3/uL (ref 4.0–10.5)

## 2017-02-19 MED ORDER — PREDNISONE 5 MG PO TABS: 5.0000 mg | ORAL_TABLET | Freq: Every day | ORAL | 11 refills | Status: DC

## 2017-02-19 MED ORDER — APIXABAN 2.5 MG PO TABS: 2.5000 mg | ORAL_TABLET | Freq: Two times a day (BID) | ORAL | 11 refills | Status: DC

## 2017-02-19 MED ORDER — ACYCLOVIR 200 MG PO CAPS: 200.0000 mg | ORAL_CAPSULE | Freq: Every day | ORAL | 11 refills | Status: DC

## 2017-02-19 MED ORDER — AZITHROMYCIN 250 MG PO TABS: ORAL_TABLET | ORAL | 0 refills | Status: DC

## 2017-02-19 MED ORDER — LOSARTAN POTASSIUM 50 MG PO TABS: 50.0000 mg | ORAL_TABLET | Freq: Every day | ORAL | 11 refills | Status: DC

## 2017-02-19 MED ORDER — AMLODIPINE BESYLATE 5 MG PO TABS: 5.0000 mg | ORAL_TABLET | Freq: Every day | ORAL | 11 refills | Status: DC

## 2017-02-19 NOTE — Assessment & Plan Note (Signed)
Reduced Losartan

## 2017-02-19 NOTE — Assessment & Plan Note (Signed)
Reduce Eliquis due to bruising

## 2017-02-19 NOTE — Patient Instructions (Signed)
You can use over-the-counter  "cold" medicines  such as " Delsym" or" Robitussin" cough syrup varietis for cough.  You can use plain "Tylenol" for fever, chills and achyness. Use Halls or Ricola cough drops.    Please, make an appointment if you are not better or if you're worse.  

## 2017-02-19 NOTE — Progress Notes (Signed)
Subjective:  Patient ID: [name], female    DOB: Apr 17, 1960  Age: 57 y.o. MRN: [id number]  CC: No chief complaint on file.   HPI [name] presents for cough/URI sx's.  F/u anticoagulation: Diltiazem and Eliquis interaction. Diltiazem is stopped C/o bruising  Outpatient Medications Prior to Visit  Medication Sig Dispense Refill  . acyclovir (ZOVIRAX) 200 MG capsule Take 200-400 mg by mouth daily.     [name] amLODipine (NORVASC) 5 MG tablet Take by mouth.    [name] apixaban (ELIQUIS) 5 MG TABS tablet Take 1 tablet (5 mg total) by mouth 2 (two) times daily. 60 tablet 3  . glucose blood test strip 1 each by Other route as directed. 100 each 3  . hydroxypropyl methylcellulose / hypromellose (ISOPTO TEARS / GONIOVISC) 2.5 % ophthalmic solution Place 1 drop into the right eye daily.    [name] loratadine (CLARITIN) 10 MG tablet Take 10 mg by mouth daily.    [name] losartan (COZAAR) 100 MG tablet Take 0.5 tablets (50 mg total) by mouth daily. Must keep appt on 11/29/16 90 tablet 0  . Omega-3 300 MG CAPS Take 2 capsules by mouth daily.     . ONE TOUCH LANCETS MISC 1 each by Does not apply route as directed. 100 each 3  . potassium chloride (K-DUR) 10 MEQ tablet Take 1 tablet (10 mEq total) by mouth daily. 30 tablet 3  . predniSONE (DELTASONE) 5 MG tablet Take 5 mg by mouth daily.    . vitamin B-12 (CYANOCOBALAMIN) 100 MCG tablet Take 100 mcg by mouth daily. Reported on 06/20/2015    . azithromycin (ZITHROMAX Z-PAK) 250 MG tablet As directed 6 each 0  . azithromycin (ZITHROMAX) 250 MG tablet Take 2 tablets on first day, then 1 tablet daily until finished. 6 tablet 0  . BIOTIN PO Take by mouth daily.    [name] diltiazem (CARDIZEM) 30 MG tablet Take 1 tablet (30 mg total) by mouth 3 (three) times daily. 270 tablet 3  . LORazepam (ATIVAN) 1 MG tablet Take before MRI scan for nerves 1 tablet 0  . losartan (COZAAR) 100 MG tablet Take 1 tablet (100 mg total) by mouth daily. 90 tablet 1  . Probiotic Product  (PROBIOTIC DAILY PO) Take 1 each by mouth daily.     No facility-administered medications prior to visit.     ROS Review of Systems  Constitutional: Positive for fatigue. Negative for activity change, appetite change, chills and unexpected weight change.  HENT: Negative for congestion, mouth sores and sinus pressure.   Eyes: Negative for visual disturbance.  Respiratory: Positive for cough. Negative for chest tightness.   Gastrointestinal: Negative for abdominal pain and nausea.  Genitourinary: Negative for difficulty urinating, frequency and vaginal pain.  Musculoskeletal: Positive for arthralgias. Negative for back pain and gait problem.  Skin: Positive for rash. Negative for pallor.  Neurological: Negative for dizziness, tremors, weakness, numbness and headaches.  Psychiatric/Behavioral: Negative for confusion and sleep disturbance.    Objective:  BP 116/80 (BP Location: Left Arm, Patient Position: Sitting, Cuff Size: Normal)   Pulse (!) 164   Temp 98.7 F (37.1 C) (Oral)   Ht 5\' 1"  (1.549 m)   Wt 125 lb (56.7 kg)   SpO2 98%   BMI 23.62 kg/m   BP Readings from Last 3 Encounters:  02/19/17 116/80  01/09/17 (!) 170/110  12/19/16 140/80    Wt Readings from Last 3 Encounters:  02/19/17 125 lb (56.7 kg)  01/09/17 123 lb (  55.8 kg)  12/19/16 128 lb (58.1 kg)    Physical Exam  Constitutional: She appears well-developed. No distress.  HENT:  Head: Normocephalic.  Right Ear: External ear normal.  Left Ear: External ear normal.  Nose: Nose normal.  Mouth/Throat: Oropharynx is clear and moist.  Eyes: Conjunctivae are normal. Pupils are equal, round, and reactive to light. Right eye exhibits no discharge. Left eye exhibits no discharge.  Neck: Normal range of motion. Neck supple. No JVD present. No tracheal deviation present. No thyromegaly present.  Cardiovascular: Normal rate, regular rhythm and normal heart sounds.  Pulmonary/Chest: No stridor. No respiratory distress.  She has no wheezes.  Abdominal: Soft. Bowel sounds are normal. She exhibits no distension and no mass. There is no tenderness. There is no rebound and no guarding.  Musculoskeletal: She exhibits tenderness. She exhibits no edema.  Lymphadenopathy:    She has no cervical adenopathy.  Neurological: She displays normal reflexes. No cranial nerve deficit. She exhibits normal muscle tone. Coordination normal.  Skin: No rash noted. No erythema.  Psychiatric: She has a normal mood and affect. Her behavior is normal. Judgment and thought content normal.   A large bruise on abdomen  Lab Results  Component Value Date   WBC 8.3 12/19/2016   HGB 15.6 (H) 12/19/2016   HCT 47.4 (H) 12/19/2016   PLT 251.0 12/19/2016   GLUCOSE 138 (H) 01/09/2017   CHOL 150 01/25/2013   TRIG 55.0 01/25/2013   HDL 38.90 (L) 01/25/2013   LDLCALC 100 (H) 01/25/2013   ALT 57 (H) 12/09/2016   AST 47 (H) 12/09/2016   NA 144 01/09/2017   K 3.2 (L) 01/09/2017   CL 102 01/09/2017   CREATININE 1.27 (H) 01/09/2017   BUN 24 01/09/2017   CO2 27 01/09/2017   TSH 2.73 05/09/2016   INR 1.18 05/23/2016   HGBA1C 5.9 05/18/2013    Dg Chest 2 View  Result Date: 12/27/2016 CLINICAL DATA:  Follow-up right-sided pleural effusion. EXAM: CHEST  2 VIEW COMPARISON:  CT of the chest 12/16/2016 FINDINGS: Cardiomediastinal silhouette is normal. Mediastinal contours appear intact. Persistent small right pleural effusion. Persistent confluent peribronchial airspace opacities with architectural distortion of the interstitium and tenting of the right hemidiaphragm, presumed changes of sarcoidosis. Persistent bronchiectasis. Osseous structures are without acute abnormality. Soft tissues are grossly normal. IMPRESSION: Persistent small right pleural effusion. Persistent confluent peribronchial airspace opacities with architectural distortion of the interstitium and tenting of the right hemidiaphragm, presumed changes of sarcoidosis. Persistent  bronchiectasis. Electronically Signed   By: Fidela Salisbury M.D.   On: 12/27/2016 16:42    Assessment & Plan:   There are no diagnoses linked to this encounter. I have discontinued [employer] Probiotic Product (PROBIOTIC DAILY PO), BIOTIN PO, LORazepam, diltiazem, azithromycin, and azithromycin. I am also having her maintain her glucose blood, ONE TOUCH LANCETS, vitamin B-12, Omega-3, loratadine, acyclovir, hydroxypropyl methylcellulose / hypromellose, apixaban, predniSONE, losartan, potassium chloride, and amLODipine.  No orders of the defined types were placed in this encounter.    Follow-up: No Follow-up on file.  Walker Kehr, MD

## 2017-02-21 ENCOUNTER — Encounter: Payer: Self-pay | Admitting: Internal Medicine

## 2017-02-24 ENCOUNTER — Telehealth: Payer: Self-pay | Admitting: Internal Medicine

## 2017-02-24 DIAGNOSIS — D582 Other hemoglobinopathies: Secondary | ICD-10-CM

## 2017-02-24 NOTE — Telephone Encounter (Signed)
Copied from [place] [id number]. Topic: Quick Communication - See Telephone Encounter >> Feb 24, 2017  3:44 PM [name] wrote: Patient called stating that she was wondering if the assistant could find out when Dr. Alain Marion wants her to start getting her blood drawn for the hemoglobin. If an appt is made, then it needs to be after 3pm and any day except Tues, Weds and Fridays. Please advise.

## 2017-02-24 NOTE — Telephone Encounter (Signed)
Went over everything with pt and standing labs ordered

## 2017-02-24 NOTE — Telephone Encounter (Signed)
[name] can you call and explain to this patient what she needs to do?

## 2017-02-25 ENCOUNTER — Other Ambulatory Visit (INDEPENDENT_AMBULATORY_CARE_PROVIDER_SITE_OTHER): Payer: BLUE CROSS/BLUE SHIELD

## 2017-02-25 DIAGNOSIS — D582 Other hemoglobinopathies: Secondary | ICD-10-CM

## 2017-02-25 LAB — CBC WITH DIFFERENTIAL/PLATELET
BASOS ABS: 0.1 10*3/uL (ref 0.0–0.1)
Basophils Relative: 0.8 % (ref 0.0–3.0)
EOS ABS: 0 10*3/uL (ref 0.0–0.7)
Eosinophils Relative: 0.6 % (ref 0.0–5.0)
HCT: 43.8 % (ref 36.0–46.0)
Hemoglobin: 15 g/dL (ref 12.0–15.0)
Lymphocytes Relative: 12.5 % (ref 12.0–46.0)
Lymphs Abs: 0.9 10*3/uL (ref 0.7–4.0)
MCHC: 34.2 g/dL (ref 30.0–36.0)
MCV: 91.6 fl (ref 78.0–100.0)
MONO ABS: 0.5 10*3/uL (ref 0.1–1.0)
Monocytes Relative: 6.4 % (ref 3.0–12.0)
NEUTROS ABS: 5.8 10*3/uL (ref 1.4–7.7)
NEUTROS PCT: 79.7 % — AB (ref 43.0–77.0)
Platelets: 346 10*3/uL (ref 150.0–400.0)
RBC: 4.78 Mil/uL (ref 3.87–5.11)
RDW: 15.3 % (ref 11.5–15.5)
WBC: 7.3 10*3/uL (ref 4.0–10.5)

## 2017-02-25 MED ORDER — AMLODIPINE BESYLATE 5 MG PO TABS: 5.0000 mg | ORAL_TABLET | Freq: Every day | ORAL | 11 refills | Status: DC

## 2017-02-25 NOTE — Addendum Note (Signed)
Addended by: Earnstine Regal on: 02/25/2017 10:04 AM   Modules accepted: Orders

## 2017-02-25 NOTE — Progress Notes (Signed)
[facility]

## 2017-03-11 ENCOUNTER — Ambulatory Visit: Payer: BLUE CROSS/BLUE SHIELD | Admitting: Internal Medicine

## 2017-03-13 ENCOUNTER — Other Ambulatory Visit (INDEPENDENT_AMBULATORY_CARE_PROVIDER_SITE_OTHER): Payer: BLUE CROSS/BLUE SHIELD

## 2017-03-13 ENCOUNTER — Ambulatory Visit: Payer: BLUE CROSS/BLUE SHIELD | Admitting: Pulmonary Disease

## 2017-03-13 ENCOUNTER — Encounter: Payer: Self-pay | Admitting: Pulmonary Disease

## 2017-03-13 VITALS — BP 130/70 | HR 106 | Ht 61.0 in | Wt 129.0 lb

## 2017-03-13 DIAGNOSIS — R160 Hepatomegaly, not elsewhere classified: Secondary | ICD-10-CM

## 2017-03-13 DIAGNOSIS — I272 Pulmonary hypertension, unspecified: Secondary | ICD-10-CM

## 2017-03-13 DIAGNOSIS — D860 Sarcoidosis of lung: Secondary | ICD-10-CM

## 2017-03-13 LAB — COMPREHENSIVE METABOLIC PANEL
ALBUMIN: 4 g/dL (ref 3.5–5.2)
ALK PHOS: 134 U/L — AB (ref 39–117)
ALT: 32 U/L (ref 0–35)
AST: 34 U/L (ref 0–37)
BILIRUBIN TOTAL: 0.6 mg/dL (ref 0.2–1.2)
BUN: 21 mg/dL (ref 6–23)
CO2: 33 mEq/L — ABNORMAL HIGH (ref 19–32)
Calcium: 10.1 mg/dL (ref 8.4–10.5)
Chloride: 101 mEq/L (ref 96–112)
Creatinine, Ser: 1.19 mg/dL (ref 0.40–1.20)
GFR: 59.99 mL/min — AB (ref >60.00)
Glucose, Bld: 91 mg/dL (ref 70–99)
POTASSIUM: 3.5 meq/L (ref 3.5–5.1)
Sodium: 142 mEq/L (ref 135–145)
TOTAL PROTEIN: 7.5 g/dL (ref 6.0–8.3)

## 2017-03-13 NOTE — Progress Notes (Signed)
Subjective:    Patient ID: [name], female    DOB: [birth year], 57 y.o.   MRN: [id number]  Synopsis: Former patient of Dr. Joya Gaskins who has sarcoidosis. She also has asthma. She was diagnosed with a skin biopsy in 1983 when she had a rash.  The disease had been relatively quiet for years but in 2018 she developed a large liver mass which was noted to be full of non-caseating granulomas on biopsy.  A HRCT performed in 2018 showed worsening consolidative changes in her lungs bilaterally consistent with sarcoidosis.  She started taking prednisone on June 30.   HPI  Chief Complaint  Patient presents with  . Follow-up    pt states she is doing well today.    Beckie says that she feels really well.  She has gained weight which she thinks has helped. She has noticed that her hair has grown. She didn't get the MRI because she says [facility] called her and demanded $1200 before the procedure.  She couldn't afford this so she didn't have it done. She really wants to stop the prednisone.   Past Medical History:  Diagnosis Date  . AAA (abdominal aortic aneurysm) (HCC)    a. 4.2 cm by CT in 06/2016  . Allergy   . Anxiety   . Asthma   . Atrial flutter (HCC)    a. on Flecainide and Eliquis  . Cataract   . Depression    pt states no depression 07-11-15  . Glaucoma   . Heart murmur    as child only  . Herpes simplex   . Hypertension   . Multiple thyroid nodules   . Osteoarthritis   . Osteoporosis   . PUD (peptic ulcer disease)   . Pulmonary sarcoidosis ([place])   . Tubular adenoma of colon 07/2015  . Vitamin D deficiency      Review of Systems  Constitutional: Negative for chills, fatigue and fever.  HENT: Negative for nosebleeds, postnasal drip, rhinorrhea, sinus pressure and sneezing.   Respiratory: Negative for cough, shortness of breath and wheezing.   Cardiovascular: Negative for chest pain, palpitations and leg swelling.       Objective:   Physical Exam Vitals:   03/13/17  1053  BP: 130/70  Pulse: (!) 106  SpO2: 99%  Weight: 129 lb (58.5 kg)  Height: '5\' 1"'  (1.549 m)   RA  Gen: well appearing HENT: OP clear, TM's clear, neck supple PULM: CTA B, normal percussion CV: irreg irreg, no mgr, trace edema GI: BS+, soft, nontender Derm: no cyanosis or rash Psyche: normal mood and affect     Imaging: CXR images from 2016 reviewed: There is significant upper lobe scarring which radiology feels has progressed compared to the prior study 06/2016 HRCT> significant interval worsening of pulmonary parenchymal abnormality, mediastinal lymphadenopathy worse; also with ectactic aorta, suggestion of pulmonary hypertension 07/2016 Cardiac MRI: no evidence of sarcoidosis, mild LAE and RAE, LVEF 58% 12/2016 HRCT> per radiology some increase in bilateral upper lobe predominant fibrosis, images independently reviewed, its not clear to me that this has progressed as some of the patchy airspace disease has decreased, however there is a new R pleural effusion  PFT: June 2014 pulmonary function testing ratio 77%, FEV1 1.99 L, 104% predicted), FVC 2.60 L, 108% predicted, total lung capacity 3.75 L (84% predicted), DLCO 20.0 (106% predicted) July 2018 pulmonary function testing from [facility] ratio 55%, FVC 2.17 L 82% predicted, FEV1 1.27 L, total lung capacity 4.4 L 85% protected,  DLCO 65% predicted November 2018 spirometry from [facility] Ratio 55% FVC 1.74 L  Pathology: May 2018 liver biopsy showed noncaseating granulomas, special stains were negative for organisms.  Records from her visit with Dr. Dorothyann Peng in the [facility] interstitial lung disease clinic reviewed where she was continued on prednisone.  Plans for repeat lung function testing in May arranged.     Assessment & Plan:  Pulmonary sarcoidosis ([place]) - Plan: Comp Met (CMET)  Liver mass  Pulmonary hypertension, low resistance (HCC)  Discussion: Ms. Mccumbers looks great today.  She says she feels significantly better and has  gained some weight back.  She has no respiratory complaints at all.  She has no GI complaints either.  Today she does have 2 requests: 1 she would like to avoid paying for an MRI because she says she cannot afford it and unfortunately [facility] demands $1200 for her to have the test.  2 she would like to stop prednisone because of the long-term side effects.  After some discussion I explained to her that I am okay with stopping the prednisone as long as we watch her very carefully.  We will check liver function test today as well as on the next visit as well to make sure there is no evidence of worsening of the liver mass.  Plan: Liver mass: We will forego the MRI for now because it so costly We will check liver function testing today and again when you return in 3-4 weeks  Pulmonary sarcoidosis: I am glad you are doing much better It is okay to stop prednisone for now but let me know right away if you have any worsening symptoms We will see you back in 3-4 weeks to make sure you are doing okay Keep your follow-up appointment with Dr. Dorothyann Peng at [facility] in May, he will do lung function testing then  Pulmonary hypertension: We will plan to repeat an echocardiogram in December of this year  We will see you back in 3-4 weeks either with me or a nurse practitioner   Current Outpatient Medications:  .  acyclovir (ZOVIRAX) 200 MG capsule, Take 1-2 capsules (200-400 mg total) by mouth daily., Disp: 30 capsule, Rfl: 11 .  amLODipine (NORVASC) 5 MG tablet, Take 1 tablet (5 mg total) by mouth daily., Disp: 30 tablet, Rfl: 11 .  apixaban (ELIQUIS) 2.5 MG TABS tablet, Take 1 tablet (2.5 mg total) by mouth 2 (two) times daily., Disp: 60 tablet, Rfl: 11 .  glucose blood test strip, 1 each by Other route as directed., Disp: 100 each, Rfl: 3 .  hydroxypropyl methylcellulose / hypromellose (ISOPTO TEARS / GONIOVISC) 2.5 % ophthalmic solution, Place 1 drop into the right eye daily., Disp: , Rfl:  .   loratadine (CLARITIN) 10 MG tablet, Take 10 mg by mouth daily., Disp: , Rfl:  .  losartan (COZAAR) 50 MG tablet, Take 1 tablet (50 mg total) by mouth daily., Disp: 30 tablet, Rfl: 11 .  ONE TOUCH LANCETS MISC, 1 each by Does not apply route as directed., Disp: 100 each, Rfl: 3 .  potassium chloride (K-DUR) 10 MEQ tablet, Take 1 tablet (10 mEq total) by mouth daily., Disp: 30 tablet, Rfl: 3 .  predniSONE (DELTASONE) 5 MG tablet, Take 1 tablet (5 mg total) by mouth daily., Disp: 30 tablet, Rfl: 11 .  vitamin B-12 (CYANOCOBALAMIN) 100 MCG tablet, Take 100 mcg by mouth daily. Reported on 06/20/2015, Disp: , Rfl:

## 2017-03-13 NOTE — Patient Instructions (Signed)
Liver mass: We will forego the MRI for now because it so costly We will check liver function testing today and again when you return in 3-4 weeks  Pulmonary sarcoidosis: I am glad you are doing much better It is okay to stop prednisone for now but let me know right away if you have any worsening symptoms We will see you back in 3-4 weeks to make sure you are doing okay Keep your follow-up appointment with Dr. Dorothyann Peng at [facility] in May, he will do lung function testing then  Pulmonary hypertension: We will plan to repeat an echocardiogram in December of this year  We will see you back in 3-4 weeks either with me or a nurse practitioner

## 2017-03-25 ENCOUNTER — Other Ambulatory Visit: Payer: Self-pay | Admitting: Internal Medicine

## 2017-03-25 ENCOUNTER — Telehealth: Payer: Self-pay | Admitting: Internal Medicine

## 2017-03-25 MED ORDER — FLUCONAZOLE 150 MG PO TABS: 150.0000 mg | ORAL_TABLET | Freq: Once | ORAL | 0 refills | Status: AC

## 2017-03-25 NOTE — Telephone Encounter (Signed)
Please advise in Dr. Plotnikovs absence 

## 2017-03-25 NOTE — Telephone Encounter (Signed)
Copied from [place]. Topic: Quick Communication - Rx Refill/Question >> Mar 25, 2017  9:35 AM Scherrie Gerlach wrote: Medication:  fluconazole (DIFLUCAN) 100 MG tablet   Has the patient contacted their pharmacy? No/ not a current Rx Pt states she has been on several abx since Jan.  Dr Plornikov prescibed this abx once.  Pt states she has a yeast infection from the abx and would like the doctor to call in please.  [id number] [address], [place] [phone] (Phone) [phone] (Fax)

## 2017-04-07 ENCOUNTER — Telehealth: Payer: Self-pay | Admitting: Pulmonary Disease

## 2017-04-07 NOTE — Telephone Encounter (Signed)
Called and spoke with patient she states that in her last OV with BQ she was to let him know when she started her prednisone again. Patient states that she started the prednisone again this Saturday. Sending this to BQ as an FYI.

## 2017-04-11 ENCOUNTER — Ambulatory Visit: Payer: BLUE CROSS/BLUE SHIELD | Admitting: Family

## 2017-04-11 NOTE — Telephone Encounter (Signed)
OK 

## 2017-04-14 ENCOUNTER — Ambulatory Visit: Payer: BLUE CROSS/BLUE SHIELD | Admitting: Pulmonary Disease

## 2017-04-14 VITALS — BP 120/82 | HR 88 | Ht 61.0 in | Wt 130.6 lb

## 2017-04-14 DIAGNOSIS — R160 Hepatomegaly, not elsewhere classified: Secondary | ICD-10-CM | POA: Diagnosis not present

## 2017-04-14 DIAGNOSIS — I272 Pulmonary hypertension, unspecified: Secondary | ICD-10-CM

## 2017-04-14 DIAGNOSIS — J301 Allergic rhinitis due to pollen: Secondary | ICD-10-CM

## 2017-04-14 DIAGNOSIS — D860 Sarcoidosis of lung: Secondary | ICD-10-CM

## 2017-04-14 NOTE — Progress Notes (Signed)
Subjective:    Patient ID: [name], female    DOB: [birth year], 57 y.o.   MRN: [id number]  Synopsis: Former patient of Dr. Joya Gaskins who has sarcoidosis. She also has asthma. She was diagnosed with a skin biopsy in 1983 when she had a rash.  The disease had been relatively quiet for years but in 2018 she developed a large liver mass which was noted to be full of non-caseating granulomas on biopsy.  A HRCT performed in 2018 showed worsening consolidative changes in her lungs bilaterally consistent with sarcoidosis.  She started taking prednisone on June 30.   HPI  Chief Complaint  Patient presents with  . Follow-up    pt started back on maintenance pred X1 wk ago, states this has improved her sinus congestion, PND although it is still present.     [name] stopped the prednisone after she saw me last time when she had requested to stop it.  However when she had some sinus congestion recently she took the prednisone.  She started taking 10mg  daily on March 23.   She now feels better.  She still has been working on drinking water a lot.   She has taken loratadine in the past.   No abdominal pain or problems when she was off the prednisone.  Bowel movements have been regular. No recent breathing difficulty.   Past Medical History:  Diagnosis Date  . AAA (abdominal aortic aneurysm) (HCC)    a. 4.2 cm by CT in 06/2016  . Allergy   . Anxiety   . Asthma   . Atrial flutter (HCC)    a. on Flecainide and Eliquis  . Cataract   . Depression    pt states no depression 07-11-15  . Glaucoma   . Heart murmur    as child only  . Herpes simplex   . Hypertension   . Multiple thyroid nodules   . Osteoarthritis   . Osteoporosis   . PUD (peptic ulcer disease)   . Pulmonary sarcoidosis ([place])   . Tubular adenoma of colon 07/2015  . Vitamin D deficiency      Review of Systems  Constitutional: Negative for chills, fatigue and fever.  HENT: Negative for nosebleeds, postnasal drip, rhinorrhea,  sinus pressure and sneezing.   Respiratory: Negative for cough, shortness of breath and wheezing.   Cardiovascular: Negative for chest pain, palpitations and leg swelling.       Objective:   Physical Exam Vitals:   04/14/17 1607  BP: 120/82  Pulse: 88  SpO2: 97%  Weight: 130 lb 9.6 oz (59.2 kg)  Height: 5\' 1"  (1.549 m)   RA  Gen: well appearing HENT: OP clear, TM's clear, neck supple PULM: CTA B, normal percussion CV: RRR, no mgr, trace edema GI: BS+, soft, nontender Derm: no cyanosis or rash Psyche: normal mood and affect    Imaging: CXR images from 2016 reviewed: There is significant upper lobe scarring which radiology feels has progressed compared to the prior study 06/2016 HRCT> significant interval worsening of pulmonary parenchymal abnormality, mediastinal lymphadenopathy worse; also with ectactic aorta, suggestion of pulmonary hypertension 07/2016 Cardiac MRI: no evidence of sarcoidosis, mild LAE and RAE, LVEF 58% 12/2016 HRCT> per radiology some increase in bilateral upper lobe predominant fibrosis, images independently reviewed, its not clear to me that this has progressed as some of the patchy airspace disease has decreased, however there is a new R pleural effusion  PFT: June 2014 pulmonary function testing ratio 77%, FEV1 1.99  L, 104% predicted), FVC 2.60 L, 108% predicted, total lung capacity 3.75 L (84% predicted), DLCO 20.0 (106% predicted) July 2018 pulmonary function testing from [facility] ratio 55%, FVC 2.17 L 82% predicted, FEV1 1.27 L, total lung capacity 4.4 L 85% protected, DLCO 65% predicted November 2018 sp[facility]rometry from [facility] Ratio 55% FVC 1.74 L  Pathology: May 2018 liver biopsy showed noncaseating granulomas, special stains were negative for organisms.  Records from her visit with Dr. Dorothyann Peng in the [facility] interstitial lung disease clinic reviewed where she was continued on prednisone.  Plans for repeat lung function testing in May arranged.     Assessment  & Plan:  Pulmonary sarcoidosis ([place])  Liver mass  Pulmonary hypertension, low resistance (HCC)  Allergic rhinitis due to pollen, unspecified seasonality  Discussion: After asking me to stop prednisone on the last visit she resumed it again on her own for the management of allergic rhinitis.  She has some cough and chest congestion right now but normal sounding lungs so I do not think that she is having worsening respiratory problems in the setting of ongoing allergic rhinitis.  She is convinced that nasal steroids cause her blood pressure and heart rate to go up despite me trying to convince her that the dose of steroid and a nasal steroid is actually thousand fold less than it is in the prednisone she is taking right now.  I think the best approach moving forward is to have her stop taking the prednisone and try to treat the nasal symptoms (allergic rhinitis) with a simple as a regimen is possible.  However, if she continues to have chest congestion and mucus production in the absence of allergic rhinitis symptoms then we will need to expedite lung function testing and chest imaging.  Plan: Allergic rhinitis: Take loratadine daily Take Singulair daily Use the Nettie pot daily If your symptoms do not improve then try taking nasal mometasone 2 sprays each nostril daily Stop prednisone  Pulmonary sarcoidosis: Stop prednisone Get a lung function test in May when you see Dr. 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 Dorothyann Peng in May, me in June   Current Outpatient Medications:  .  acyclovir (ZOVIRAX) 200 MG capsule, Take 1-2 capsules (200-400 mg  total) by mouth daily., Disp: 30 capsule, Rfl: 11 .  amLODipine (NORVASC) 5 MG tablet, Take 1 tablet (5 mg total) by mouth daily., Disp: 30 tablet, Rfl: 11 .  apixaban (ELIQUIS) 2.5 MG TABS tablet, Take 1 tablet (2.5 mg total) by mouth 2 (two) times daily., Disp: 60 tablet, Rfl: 11 .  glucose blood test strip, 1 each by Other route as directed., Disp: 100 each, Rfl: 3 .  hydroxypropyl methylcellulose / hypromellose (ISOPTO TEARS / GONIOVISC) 2.5 % ophthalmic solution, Place 1 drop into the right eye daily., Disp: , Rfl:  .  loratadine (CLARITIN) 10 MG tablet, Take 10 mg by mouth daily., Disp: , Rfl:  .  losartan (COZAAR) 50 MG tablet, Take 1 tablet (50 mg total) by mouth daily., Disp: 30 tablet, Rfl: 11 .  Multiple Vitamins-Minerals (HAIR SKIN & NAILS ADVANCED PO), Take 2 capsules by mouth daily., Disp: , Rfl:  .  omega-3 acid  ethyl esters (LOVAZA) 1 g capsule, Take 2 g by mouth daily. "Omega XL", Disp: , Rfl:  .  ONE TOUCH LANCETS MISC, 1 each by Does not apply route as directed., Disp: 100 each, Rfl: 3 .  potassium chloride (K-DUR) 10 MEQ tablet, Take 1 tablet (10 mEq total) by mouth daily., Disp: 30 tablet, Rfl: 3 .  predniSONE (DELTASONE) 5 MG tablet, Take 1 tablet (5 mg total) by mouth daily., Disp: 30 tablet, Rfl: 11 .  vitamin B-12 (CYANOCOBALAMIN) 100 MCG tablet, Take 100 mcg by mouth daily. Reported on 06/20/2015, Disp: , Rfl:

## 2017-04-14 NOTE — Patient Instructions (Signed)
Allergic rhinitis: Take loratadine daily Take Singulair daily Use the Nettie pot daily If your symptoms do not improve then try taking nasal mometasone 2 sprays each nostril daily Stop prednisone  Pulmonary sarcoidosis: Stop prednisone Get a lung function test in May when you see Dr. 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 Dorothyann Peng in May, me in June

## 2017-04-21 ENCOUNTER — Encounter: Payer: Self-pay | Admitting: Family

## 2017-04-21 ENCOUNTER — Ambulatory Visit: Payer: BLUE CROSS/BLUE SHIELD | Admitting: Family

## 2017-04-21 ENCOUNTER — Ambulatory Visit: Payer: Self-pay | Admitting: *Deleted

## 2017-04-21 VITALS — BP 122/80 | HR 133 | Temp 97.7°F | Ht 61.0 in | Wt 127.1 lb

## 2017-04-21 DIAGNOSIS — R002 Palpitations: Secondary | ICD-10-CM | POA: Diagnosis not present

## 2017-04-21 DIAGNOSIS — I4891 Unspecified atrial fibrillation: Secondary | ICD-10-CM

## 2017-04-21 DIAGNOSIS — I4892 Unspecified atrial flutter: Secondary | ICD-10-CM | POA: Diagnosis not present

## 2017-04-21 MED ORDER — DIGOXIN 125 MCG PO TABS: 0.0625 mg | ORAL_TABLET | Freq: Every day | ORAL | 0 refills | Status: DC

## 2017-04-21 NOTE — Patient Instructions (Signed)
Please go directly to the ER if you start to feel dizzy, light-headed, chest pain, short of breath;  I have updated the referral to the EP specialist in cardiology;  Discussed with Dr. Alain Marion and will try low dose of Digoxin since your allergies have limited treatment options for you.

## 2017-04-21 NOTE — Telephone Encounter (Signed)
Pt called stating that her heart rate is in the 130's; she thinks it started last night around 2130; recommendations made per nurse triage protocol to include seeing a physician within 4 hours; pt is normally seen by  Dr Alain Marion but he has no availability within the parameters set per protocol; pt offered and accepted appointment with Jodi Mourning, [facility] today at 1100; pt verbalizes understanding; will route to office for notification of this upcoming appointment.   Reason for Disposition . Taking water pill (i.e., diuretic) or heart medication (e.g., digoxin)  Answer Assessment - Initial Assessment Questions 1. DESCRIPTION: "Please describe your heart rate or heart beat that you are having" (e.g., fast/slow, regular/irregular, skipped or extra beats, "palpitations")     fast 2. ONSET: "When did it start?" (Minutes, hours or days)      04/21/17 3. DURATION: "How long does it last" (e.g., seconds, minutes, hours)    hours 4. PATTERN "Does it come and go, or has it been constant since it started?"  "Does it get worse with exertion?"   "Are you feeling it now?"     No, feel tired 5. TAP: "Using your hand, can you tap out what you are feeling on a chair or table in front of you, so that I can hear?" (Note: not all patients can do this)       Not able 6. HEART RATE: "Can you tell me your heart rate?" "How many beats in 15 seconds?"  (Note: not all patients can do this)       130's 7. RECURRENT SYMPTOM: "Have you ever had this before?" If so, ask: "When was the last time?" and "What happened that time?"      Yes history afib/aflutter 8. CAUSE: "What do you think is causing the palpitations?"     Ate tortilla chips last night 9. CARDIAC HISTORY: "Do you have any history of heart disease?" (e.g., heart attack, angina, bypass surgery, angioplasty, arrhythmia)      Hypertension, arrhythmia 10. OTHER SYMPTOMS: "Do you have any other symptoms?" (e.g., dizziness, chest pain, sweating, difficulty  breathing)  tired, short of breath when walking up a hill 11. PREGNANCY: "Is there any chance you are pregnant?" "When was your last menstrual period?"       no  Protocols used: HEART RATE AND HEARTBEAT QUESTIONS-A-AH

## 2017-04-21 NOTE — Progress Notes (Signed)
[name] is a 57 y.o. female with the following history as recorded in EpicCare:  Patient Active Problem List   Diagnosis Date Noted  . Pulmonary sarcoidosis ([place])   . PUD (peptic ulcer disease)   . Multiple thyroid nodules   . Hypertension   . Heart murmur   . Depression   . Cataract   . Atrial flutter ([place])   . Anxiety   . Allergy   . AAA (abdominal aortic aneurysm) ([place])   . Weight loss 02/29/2016  . Atrial fibrillation ([place]) 01/23/2016  . Tubular adenoma of colon 07/15/2015  . Acute maxillary sinusitis 04/08/2015  . Colon cancer screening 02/21/2015  . Abdominal pain, left lower quadrant 06/20/2014  . Glaucoma 05/31/2014  . Loose stools 05/31/2014  . Dislocation of finger PIP joint 01/03/2014  . Finger pain, right 12/28/2013  . B12 deficiency 12/15/2013  . Hypercalcemia 09/10/2013  . Creatinine elevation 09/10/2013  . Nonallopathic lesion of cervical region 06/11/2013  . Elevated LFTs 05/21/2013  . Greater trochanteric bursitis of right hip 05/14/2013  . Hypokalemia 04/12/2013  . Right acetabular fracture ([place]) 04/02/2013  . Acute right hip pain 03/31/2013  . Right sciatic nerve pain 02/26/2013  . Neck pain on right side 02/26/2013  . Exposure to the flu 01/25/2013  . Fall due to stumbling 07/24/2012  . Onychomycosis 04/27/2012  . Dizziness 01/14/2012  . Abdominal pain, left upper quadrant 09/30/2011  . Irritable bladder 09/02/2011  . Hyperglycemia 09/02/2011  . Nasal turbinate hypertrophy 03/08/2011  . Hypersomnia 09/12/2010  . VAGINITIS 02/27/2010  . Headache(784.0) 02/13/2010  . TMJ PAIN 10/20/2009  . GRIEF REACTION 02/23/2009  . PERIMENOPAUSAL SYNDROME 05/24/2008  . FATIGUE 12/07/2007  . TACHYCARDIA 12/07/2007  . GANGLION CYST, WRIST, LEFT 07/17/2007  . Sarcoidosis 06/28/2007  . Allergic rhinitis 03/24/2007  . Osteoarthritis 03/24/2007  . HAND PAIN 03/24/2007  . TINEA PEDIS 12/10/2006  . Depression with anxiety 12/10/2006  . Asthma, moderate  persistent 12/10/2006  . GLAUCOMA NOS 11/06/2006  . Essential hypertension 11/06/2006  . OSTEOPOROSIS 11/06/2006    Current Outpatient Medications  Medication Sig Dispense Refill  . acyclovir (ZOVIRAX) 200 MG capsule Take 1-2 capsules (200-400 mg total) by mouth daily. 30 capsule 11  . amLODipine (NORVASC) 5 MG tablet Take 1 tablet (5 mg total) by mouth daily. 30 tablet 11  . apixaban (ELIQUIS) 2.5 MG TABS tablet Take 1 tablet (2.5 mg total) by mouth 2 (two) times daily. 60 tablet 11  . glucose blood test strip 1 each by Other route as directed. 100 each 3  . hydroxypropyl methylcellulose / hypromellose (ISOPTO TEARS / GONIOVISC) 2.5 % ophthalmic solution Place 1 drop into the right eye daily.    [name] loratadine (CLARITIN) 10 MG tablet Take 10 mg by mouth daily.    [name] losartan (COZAAR) 50 MG tablet Take 1 tablet (50 mg total) by mouth daily. 30 tablet 11  . Multiple Vitamins-Minerals (HAIR SKIN & NAILS ADVANCED PO) Take 2 capsules by mouth daily.    [name] omega-3 acid ethyl esters (LOVAZA) 1 g capsule Take 2 g by mouth daily. "Omega XL"    . ONE TOUCH LANCETS MISC 1 each by Does not apply route as directed. 100 each 3  . potassium bicarbonate (EFFER-K) 25 MEQ disintegrating tablet DISSOLVE ONE TABLET IN THE MOUTH daily    . predniSONE (DELTASONE) 5 MG tablet Take 1 tablet (5 mg total) by mouth daily. 30 tablet 11  . vitamin B-12 (CYANOCOBALAMIN) 100 MCG tablet Take 100  mcg by mouth daily. Reported on 06/20/2015    . digoxin (LANOXIN) 0.125 MG tablet Take 0.5 tablets (0.0625 mg total) by mouth daily. 30 tablet 0  . potassium chloride (K-DUR) 10 MEQ tablet Take 1 tablet (10 mEq total) by mouth daily. 30 tablet 3   No current facility-administered medications for this visit.     Allergies: Doxycycline; Amoxicillin-pot clavulanate; Amoxicillin-pot clavulanate; Cardizem [diltiazem hcl]; Carvedilol; Cefdinir; Cephalexin; Hydrochlorothiazide; Influenza vaccines; Prednisone; Sulfamethoxazole; Sulfasalazine;  Sulfonamide derivatives; and Verapamil  Past Medical History:  Diagnosis Date  . AAA (abdominal aortic aneurysm) (HCC)    a. 4.2 cm by CT in 06/2016  . Allergy   . Anxiety   . Asthma   . Atrial flutter (HCC)    a. on Flecainide and Eliquis  . Cataract   . Depression    pt states no depression 07-11-15  . Glaucoma   . Heart murmur    as child only  . Herpes simplex   . Hypertension   . Multiple thyroid nodules   . Osteoarthritis   . Osteoporosis   . PUD (peptic ulcer disease)   . Pulmonary sarcoidosis ([place])   . Tubular adenoma of colon 07/2015  . Vitamin D deficiency     Past Surgical History:  Procedure Laterality Date  . BUNIONECTOMY     x2  . CATARACT EXTRACTION Right   . DENTAL SURGERY    . DILATION AND CURETTAGE OF UTERUS    . fibroid tumor removal    . GLAUCOMA SURGERY     Right eye  . MYOMECTOMY      Family History  Problem Relation Age of Onset  . Lung cancer Mother        sarcoid  . Stroke Father   . Hypertension Father   . Heart Problems Father   . Stomach cancer Cousin        3rd cousin   . Colon cancer Neg Hx   . Colon polyps Neg Hx   . Esophageal cancer Neg Hx   . Rectal cancer Neg Hx     Social History   Tobacco Use  . Smoking status: Never Smoker  . Smokeless tobacco: Never Used  Substance Use Topics  . Alcohol use: No    Alcohol/week: 0.0 oz    Subjective:  Patient has a very complicated cardiac history; known history of atrial fib and atrial flutter; has not been able to tolerate any of the medications for rate control; only on Eliquis at this point; was told by her cardiologist in January that she needed to see EP specialist to discuss other treatment options. She admits she became frustrated and concerned they would just try to put her back on the same medications; as a result, she opted not to go to the appointment; This morning woke up feeling that her heart rate was higher than usual; checked and it was in the 130s; denies any chest  pain or dizziness or shortness of breath; felt "more tired" than usual; admits that she ate more salty food over this past weekend;      Objective:  Vitals:   04/21/17 1110  BP: 122/80  Pulse: (!) 133  Temp: 97.7 F (36.5 C)  TempSrc: Oral  SpO2: 97%  Weight: 127 lb 1.3 oz (57.6 kg)  Height: 5\' 1"  (1.549 m)    General: Well developed, well nourished, in no acute distress  Skin : Warm and dry.  Head: Normocephalic and atraumatic  Lungs: Respirations unlabored; clear to  auscultation bilaterally without wheeze, rales, rhonchi  CVS exam: normal rate, regular rhythm, normal S1, S2, no murmurs, rubs, clicks or gallops, irregularly irregular rhythm with rate 133 on initial exam- had come down to 114 at re-check.  Neurologic: Alert and oriented; speech intact; face symmetrical; moves all extremities well; CNII-XII intact without focal deficit  Assessment:  1. Heart palpitations   2. Atrial fibrillation and flutter ([place])     Plan:  This does not appear to be a new issue for this patient; her cardiologist documented concerns about the tachycardia in January of this year; she is allergic to most rate control medications and opted against follow-up with EP provider; Reviewed and discussed with her PCP who is very familiar with her history; recommend trial of low dose Digoxin for short term management; Strict ER precautions discussed including chest pain, shortness of breath, dizziness, feeling light-headed/ off balance- she agrees and expresses understanding; she agrees to keep her follow-up with cardiologist for later this month and I have re-done the referral to EP provider; stressed need to keep these follow-ups to prevent more complications with her heart. She expresses understanding.  No follow-ups on file.  Orders Placed This Encounter  Procedures  . Ambulatory referral to Cardiology    Referral Priority:   Routine    Referral Type:   Consultation    Referral Reason:   Specialty  Services Required    Requested Specialty:   Cardiology    Number of Visits Requested:   1  . EKG 12-Lead    Requested Prescriptions   Signed Prescriptions Disp Refills  . digoxin (LANOXIN) 0.125 MG tablet 30 tablet 0    Sig: Take 0.5 tablets (0.0625 mg total) by mouth daily.

## 2017-04-23 ENCOUNTER — Telehealth: Payer: Self-pay | Admitting: Pulmonary Disease

## 2017-04-23 MED ORDER — MOMETASONE FUROATE 50 MCG/ACT NA SUSP: 2.0000 | Freq: Every day | NASAL | 2 refills | Status: DC

## 2017-04-23 MED ORDER — MONTELUKAST SODIUM 10 MG PO TABS: 10.0000 mg | ORAL_TABLET | Freq: Every day | ORAL | 2 refills | Status: DC

## 2017-04-23 NOTE — Telephone Encounter (Signed)
Called and spoke to patient. Patient stated at last OV the she was told to start taking singulair daily and use the mometasone if needed but that the pharmacy has not gotten the Rxs. Reviewed AVS from last OV and Dr. Lake Bells did have those recommendations. Mediations sent in to [facility] on [address] per patient request. Nothing further needed at this time.

## 2017-04-25 ENCOUNTER — Encounter: Payer: Self-pay | Admitting: Family

## 2017-04-28 ENCOUNTER — Telehealth: Payer: Self-pay

## 2017-04-28 NOTE — Telephone Encounter (Signed)
Hi Mary,  When you get a second can you follow up on this patient's referral for Cardiology or if referral has been sent over is she able to schedule her own appointment since she saw Dr. Curt Bears in the past?  Thanks

## 2017-04-29 ENCOUNTER — Telehealth: Payer: Self-pay

## 2017-04-29 NOTE — Telephone Encounter (Signed)
error 

## 2017-04-29 NOTE — Telephone Encounter (Signed)
Hi [name],  Can you check on this referral for me? This is one we spoke on the other day but upon looking at her e-mail she needs to see someone else and not Almyra Deforest  as he is actually a PA-C Please let me know if their is anything I need to do to assist.  Thanks

## 2017-05-08 ENCOUNTER — Encounter: Payer: Self-pay | Admitting: Physician Assistant

## 2017-05-08 ENCOUNTER — Ambulatory Visit: Payer: BLUE CROSS/BLUE SHIELD | Admitting: Physician Assistant

## 2017-05-08 VITALS — BP 122/82 | HR 70 | Ht 61.0 in | Wt 126.6 lb

## 2017-05-08 DIAGNOSIS — I482 Chronic atrial fibrillation, unspecified: Secondary | ICD-10-CM

## 2017-05-08 DIAGNOSIS — I10 Essential (primary) hypertension: Secondary | ICD-10-CM

## 2017-05-08 DIAGNOSIS — I340 Nonrheumatic mitral (valve) insufficiency: Secondary | ICD-10-CM

## 2017-05-08 DIAGNOSIS — I4892 Unspecified atrial flutter: Secondary | ICD-10-CM

## 2017-05-08 DIAGNOSIS — I712 Thoracic aortic aneurysm, without rupture, unspecified: Secondary | ICD-10-CM

## 2017-05-08 DIAGNOSIS — Z79899 Other long term (current) drug therapy: Secondary | ICD-10-CM | POA: Diagnosis not present

## 2017-05-08 DIAGNOSIS — D860 Sarcoidosis of lung: Secondary | ICD-10-CM

## 2017-05-08 NOTE — Progress Notes (Signed)
Cardiology Office Note    Date:  05/10/2017   ID:  [name], DOB 10-19-1960, MRN [id number]  PCP:  Cassandria Anger, MD  Cardiologist:  Dr. Stanford Breed Electrophysiologist: Dr. Curt Bears, wish to consult Dr. Caryl Comes  Chief Complaint  Patient presents with  . Follow-up    seen for Dr. Stanford Breed. atrial fibrillation and atrial flutter    History of Present Illness:  [name] is a 57 y.o. female with PMH of atrial flutter on eliquis (previously flecainide), HTN, pulmonary sarcoidosis, and PUD.  Echocardiogram in January 2018 showed normal LV function, mild to moderate MR. Due to atypical atrial flutter, Dr. Curt Bears did not recommend ablation.  She was started on Eliquis on 01/24/2016.  Patient was started on flecainide and scheduled for DC cardioversion, fortunately she converted prior to the cardioversion.  GXT in March 2018 showed no chest pain, no ST changes and no stress-induced ventricular tachycardia.  Chest CT in June 2018 showed progression of pulmonary sarcoidosis.  There was also a 4.2 cm thoracic aortic aneurysm.  Cardiac MRI in July 2018 showed no evidence of sarcoid, EF 58%, moderate LAE, mild right atrial enlargement, moderate MR and mild TR.  Her amlodipine was decreased due to pedal edema.  She was seen by Dr. Stanford Breed on 09/25/2016, she self discontinued her flecainide is due to palpitation.  Her Bystolic was increased to 7.5 mg daily.  A 24-hour heart monitor obtained on 10/14/2016 showed sinus rhythm with first-degree AV block, PACs, PVCs, couplet, nonsustained VT and atrial flutter with RVR.  Patient was sent back to EP service again for consideration of antiarrhythmic therapy.  She was seen by Dr. Curt Bears on 10/23/2016, she was instructed to start on propafenone.  It was recommended for the patient to have another GXT 10 days after starting propafenone.  Patient contact cardiology 2 weeks later explaining that she never started on propafenone due to concern of possible  side effect.  Repeat GXT canceled.  She also stopped her bisoprolol due to shortness of breath.  I last saw the patient in December 2018, after discussing various options, I started the patient on low-dose diltiazem.  Unfortunately she developed a rash and also had a nosebleed and attributed to the diltiazem.  I referred her to see Dr. Curt Bears, however she wanted a second opinion and has not followed up by Dr. Curt Bears.  Echocardiogram obtained on 01/09/2017 showed EF 60 to 65%, moderately to severely dilated left and the right atrium, PA peak pressure 48 mmHg, mitral valve normal.  She presents today for cardiology office visit.  EKG today shows atrial fibrillation with a heart rate of 84, this appears to be new for her as previous EKG in December 2018 actually shows atrial flutter instead.  After her primary care provider recently started her on digoxin, her heart rate is finally controlled.  She still occasionally notice some palpitation, however largely asymptomatic.  I will obtain a digoxin level today.  I am unable to add any rate control medication today due to multiple intolerance.  She has looked up several of the electrophysiologist, she wished to discuss treatment plan with Dr. Caryl Comes, I will forward a message to Dr. Curt Bears and Dr. Caryl Comes to see if they would be agreeable with this.   Past Medical History:  Diagnosis Date  . AAA (abdominal aortic aneurysm) (HCC)    a. 4.2 cm by CT in 06/2016  . Allergy   . Anxiety   . Asthma   . Atrial  flutter ([place])    a. on Flecainide and Eliquis  . Cataract   . Depression    pt states no depression 07-11-15  . Glaucoma   . Heart murmur    as child only  . Herpes simplex   . Hypertension   . Multiple thyroid nodules   . Osteoarthritis   . Osteoporosis   . PUD (peptic ulcer disease)   . Pulmonary sarcoidosis ([place])   . Tubular adenoma of colon 07/2015  . Vitamin D deficiency     Past Surgical History:  Procedure Laterality Date  . BUNIONECTOMY      x2  . CATARACT EXTRACTION Right   . DENTAL SURGERY    . DILATION AND CURETTAGE OF UTERUS    . fibroid tumor removal    . GLAUCOMA SURGERY     Right eye  . MYOMECTOMY      Current Medications: Outpatient Medications Prior to Visit  Medication Sig Dispense Refill  . acyclovir (ZOVIRAX) 200 MG capsule Take 1-2 capsules (200-400 mg total) by mouth daily. 30 capsule 11  . amLODipine (NORVASC) 5 MG tablet Take 1 tablet (5 mg total) by mouth daily. 30 tablet 11  . apixaban (ELIQUIS) 2.5 MG TABS tablet Take 1 tablet (2.5 mg total) by mouth 2 (two) times daily. 60 tablet 11  . digoxin (LANOXIN) 0.125 MG tablet Take 0.5 tablets (0.0625 mg total) by mouth daily. 30 tablet 0  . glucose blood test strip 1 each by Other route as directed. 100 each 3  . hydroxypropyl methylcellulose / hypromellose (ISOPTO TEARS / GONIOVISC) 2.5 % ophthalmic solution Place 1 drop into the right eye daily.    [name] loratadine (CLARITIN) 10 MG tablet Take 10 mg by mouth daily.    [name] losartan (COZAAR) 50 MG tablet Take 1 tablet (50 mg total) by mouth daily. 30 tablet 11  . mometasone (NASONEX) 50 MCG/ACT nasal spray Place 2 sprays into the nose daily. 17 g 2  . montelukast (SINGULAIR) 10 MG tablet Take 1 tablet (10 mg total) by mouth at bedtime. 30 tablet 2  . Multiple Vitamins-Minerals (HAIR SKIN & NAILS ADVANCED PO) Take 2 capsules by mouth daily.    [name] omega-3 acid ethyl esters (LOVAZA) 1 g capsule Take 2 g by mouth daily. "Omega XL"    . potassium bicarbonate (EFFER-K) 25 MEQ disintegrating tablet DISSOLVE ONE TABLET IN THE MOUTH daily    . ONE TOUCH LANCETS MISC 1 each by Does not apply route as directed. (Patient not taking: Reported on 05/08/2017) 100 each 3  . vitamin B-12 (CYANOCOBALAMIN) 100 MCG tablet Take 100 mcg by mouth daily. Reported on 06/20/2015    . potassium chloride (K-DUR) 10 MEQ tablet Take 1 tablet (10 mEq total) by mouth daily. 30 tablet 3  . predniSONE (DELTASONE) 5 MG tablet Take 1 tablet (5 mg  total) by mouth daily. 30 tablet 11   No facility-administered medications prior to visit.      Allergies:   Doxycycline; Amoxicillin-pot clavulanate; Amoxicillin-pot clavulanate; Cardizem [diltiazem hcl]; Carvedilol; Cefdinir; Cephalexin; Hydrochlorothiazide; Influenza vaccines; Prednisone; Sulfamethoxazole; Sulfasalazine; Sulfonamide derivatives; and Verapamil   Social History   Socioeconomic History  . Marital status: Divorced    Spouse name: Not on file  . Number of children: Not on file  . Years of education: Not on file  . Highest education level: Not on file  Occupational History  . Not on file  Social Needs  . Financial resource strain: Not on file  . Food  insecurity:    Worry: Not on file    Inability: Not on file  . Transportation needs:    Medical: Not on file    Non-medical: Not on file  Tobacco Use  . Smoking status: Never Smoker  . Smokeless tobacco: Never Used  Substance and Sexual Activity  . Alcohol use: No    Alcohol/week: 0.0 oz  . Drug use: No  . Sexual activity: Never    Birth control/protection: Abstinence  Lifestyle  . Physical activity:    Days per week: Not on file    Minutes per session: Not on file  . Stress: Not on file  Relationships  . Social connections:    Talks on phone: Not on file    Gets together: Not on file    Attends religious service: Not on file    Active member of club or organization: Not on file    Attends meetings of clubs or organizations: Not on file    Relationship status: Not on file  Other Topics Concern  . Not on file  Social History Narrative   Single/divorced; no children; she has a friend now   Never smoked   Positive history of passive tobacco smoke exposure.   Exercise- up to 4 times a week   Caffeine- 1-2 per week     Family History:  The patient's family history includes Heart Problems in her father; Hypertension in her father; Lung cancer in her mother; Stomach cancer in her cousin; Stroke in her  father.   ROS:   Please see the history of present illness.    ROS All other systems reviewed and are negative.   PHYSICAL EXAM:   VS:  BP 122/82 (BP Location: Left Arm)   Pulse 70   Ht 5\' 1"  (1.549 m)   Wt 126 lb 9.6 oz (57.4 kg)   BMI 23.92 kg/m    GEN: Well nourished, well developed, in no acute distress  HEENT: normal  Neck: no JVD, carotid bruits, or masses Cardiac: Irregularly irregular; no murmurs, rubs, or gallops,no edema  Respiratory:  clear to auscultation bilaterally, normal work of breathing GI: soft, nontender, nondistended, + BS MS: no deformity or atrophy  Skin: warm and dry, no rash Neuro:  Alert and Oriented x 3, Strength and sensation are intact Psych: euthymic mood, full affect  Wt Readings from Last 3 Encounters:  05/08/17 126 lb 9.6 oz (57.4 kg)  04/21/17 127 lb 1.3 oz (57.6 kg)  04/14/17 130 lb 9.6 oz (59.2 kg)      Studies/Labs Reviewed:   EKG:  EKG is ordered today.  The ekg ordered today demonstrates atrial fibrillation, heart rate 84.  Recent Labs: 12/19/2016: Pro B Natriuretic peptide (BNP) 277.0 02/25/2017: Hemoglobin 15.0; Platelets 346.0 03/13/2017: ALT 32; BUN 21; Creatinine, Ser 1.19; Potassium 3.5; Sodium 142   Lipid Panel    Component Value Date/Time   CHOL 150 01/25/2013 0840   TRIG 55.0 01/25/2013 0840   TRIG 86 01/02/2006 0927   HDL 38.90 (L) 01/25/2013 0840   CHOLHDL 4 01/25/2013 0840   VLDL 11.0 01/25/2013 0840   LDLCALC 100 (H) 01/25/2013 0840    Additional studies/ records that were reviewed today include:   Echo 01/09/2017 LV EF: 60% -   65%  Study Conclusions  - Left ventricle: The cavity size was normal. There was mild   concentric hypertrophy. Systolic function was normal. The   estimated ejection fraction was in the range of 60% to 65%. Wall  motion was normal; there were no regional wall motion   abnormalities. - Left atrium: The atrium was moderately to severely dilated. - Right atrium: The atrium was  moderately to severely dilated. - Pulmonary arteries: Systolic pressure was moderately increased.   PA peak pressure: 48 mm Hg (S). - Pericardium, extracardiac: A trivial pericardial effusion was   identified.    ASSESSMENT:    1. Chronic atrial fibrillation ([place])   2. Medication management   3. Atrial flutter, unspecified type ([place])   4. Essential hypertension   5. Pulmonary sarcoidosis ([place])   6. Non-rheumatic mitral regurgitation   7. Thoracic aortic aneurysm without rupture ([place])      PLAN:  In order of problems listed above:  1. Persistent atrial fibrillation: Unknown duration, previous EKG in December shows atrial flutter instead.  This appears to be new for her.  She has been on Eliquis.  She has been intolerant to multiple beta-blocker and calcium channel blocker, her PCP recently started her on low-dose digoxin, her heart rate is currently controlled on digoxin.  I will obtain digoxin level.  She wished to obtain a second opinion from electrophysiology service.  She has looked up several EP provider and wished to discuss other rate control strategy with Dr. Caryl Comes.  I will check with Dr. Curt Bears who have seen her in the past.  2. Atrial flutter: Previous EKG in December 2018 showed atrial flutter.  Continue Eliquis.  3. Hypertension: Blood pressure well controlled.  4. Pulmonary sarcoidosis: Followed by Dr. Lake Bells of pulmonology service.  5. Thoracic aortic aneurysm: CT of the chest in June 2018 showed dilated thoracic aortic aneurysm at 4.2 cm.  This was not well appreciated on CT in December 2018.  Echocardiogram in December 2018 showed a normal-sized aortic root.  Consider repeat CTA of the chest in June 2019.    Medication Adjustments/Labs and Tests Ordered: Current medicines are reviewed at length with the patient today.  Concerns regarding medicines are outlined above.  Medication changes, Labs and Tests ordered today are listed in the Patient Instructions  below. Patient Instructions  Medication Instructions:  Continue current medication.  If you need a refill on your cardiac medications before your next appointment, please call your pharmacy.  Labwork: Digoxin level HERE IN OUR OFFICE AT [facility]  Take the provided lab slips with you to the lab for your blood draw.    Testing/Procedures: None ordered today.   Follow-Up: Your physician wants you to follow-up in: 4 months with Dr. Stanford Breed.    Thank you for choosing [facility] at [place] [place]  05/10/2017 9:05 PM    [facility] [address], [place], [place]  [address] Phone: [phone]; Fax: [phone]

## 2017-05-08 NOTE — Patient Instructions (Signed)
Medication Instructions:  Continue current medication.  If you need a refill on your cardiac medications before your next appointment, please call your pharmacy.  Labwork: Digoxin level HERE IN OUR OFFICE AT [facility]  Take the provided lab slips with you to the lab for your blood draw.    Testing/Procedures: None ordered today.   Follow-Up: Your physician wants you to follow-up in: 4 months with Dr. Stanford Breed.    Thank you for choosing [facility] at [facility]!!

## 2017-05-09 LAB — DIGOXIN LEVEL: DIGOXIN, SERUM: 0.6 ng/mL (ref 0.5–0.9)

## 2017-05-10 ENCOUNTER — Encounter: Payer: Self-pay | Admitting: Physician Assistant

## 2017-05-15 ENCOUNTER — Other Ambulatory Visit: Payer: Self-pay | Admitting: Family

## 2017-05-18 IMAGING — US US BIOPSY
1 series · 5 of 5 positions shown · non-contrast
Comparison: none

CLINICAL DATA: Ill-defined right lobe hepatic lesion.

[Series 1: us biopsy · 0.19mm/px · 5 of 5 slices shown]
[im 1/5]
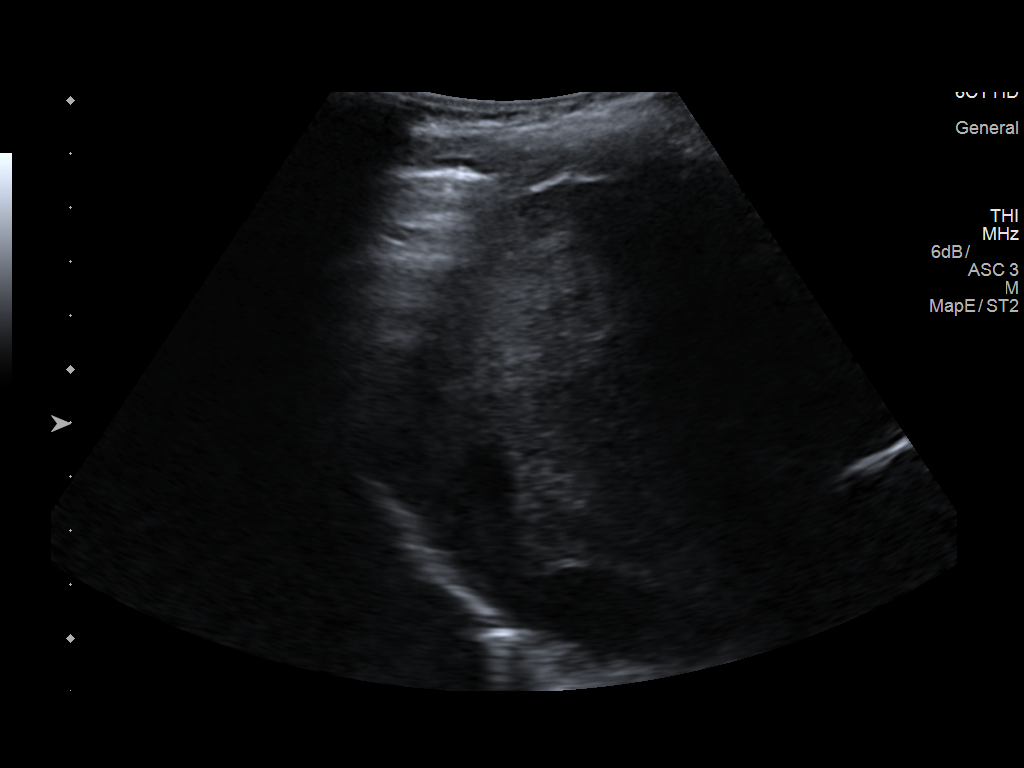
[im 2/5]
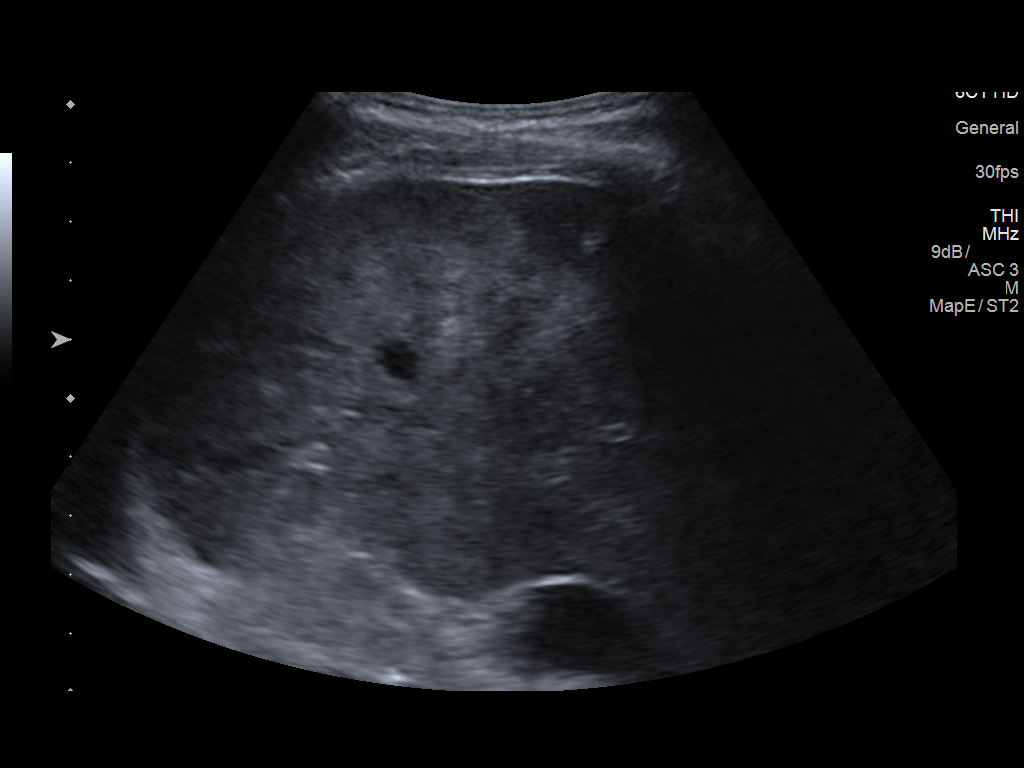
[im 3/5]
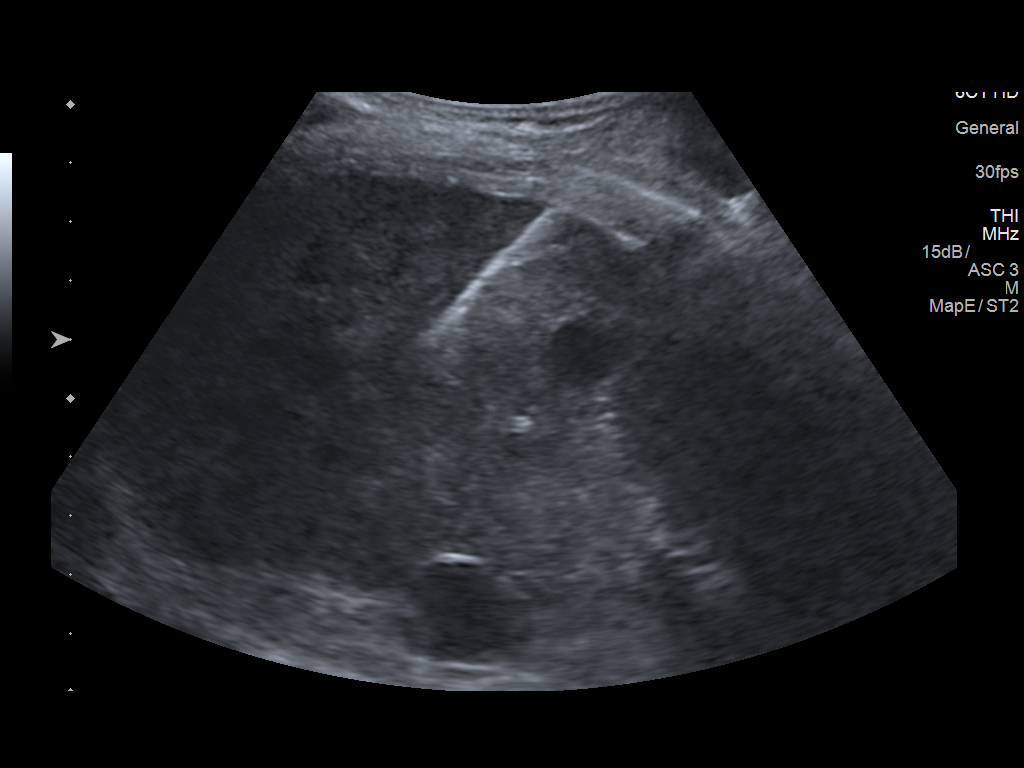
[im 4/5]
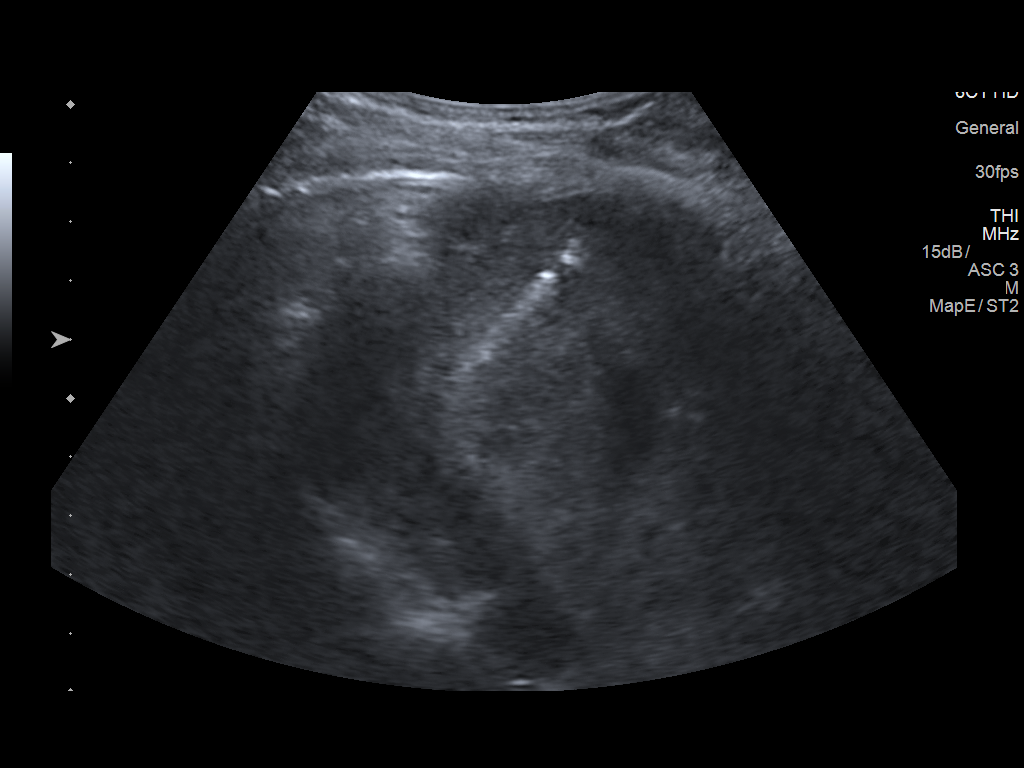
[im 5/5]
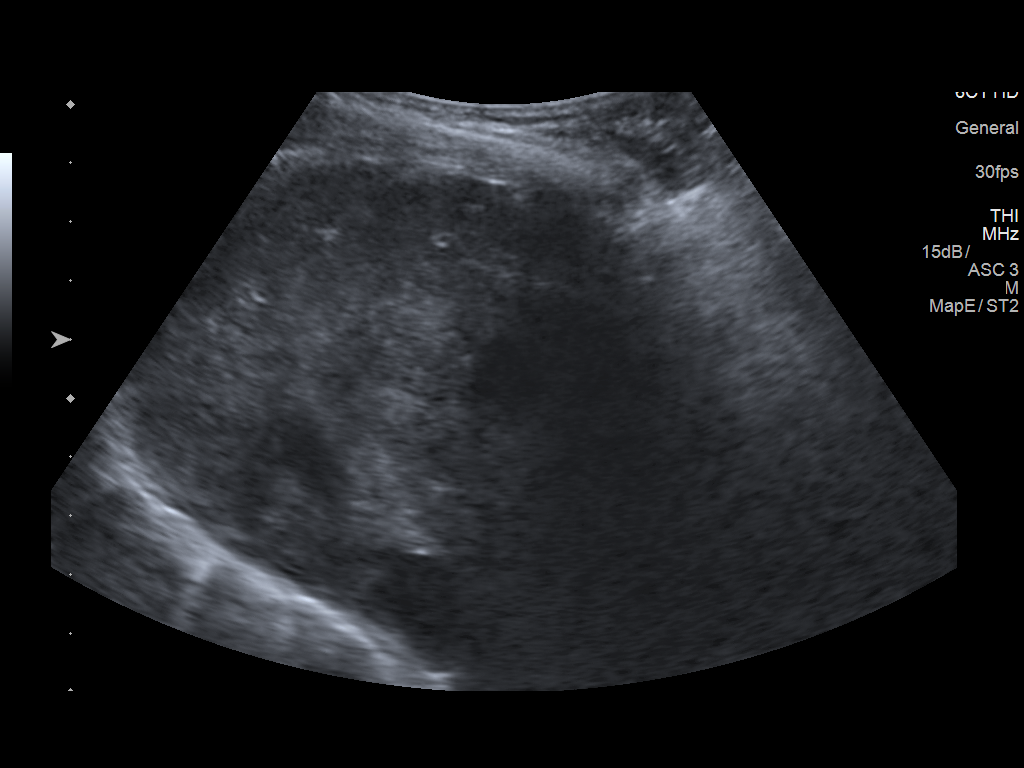

[5 of 5 positions shown; findings below may reference images not displayed]

EXAM:
ULTRASOUND GUIDED CORE BIOPSY OF RIGHT LIVER LESION

MEDICATIONS:
Intravenous Fentanyl and Versed were administered as conscious
sedation during continuous monitoring of the patient's level of
consciousness and physiological / cardiorespiratory status by the
radiology RN, with a total moderate sedation time of 10 minutes.

PROCEDURE:
The procedure, risks, benefits, and alternatives were explained to
the patient. Questions regarding the procedure were encouraged and
answered. The patient understands and consents to the procedure.

Survey ultrasound of the liver performed and the poorly marginated
somewhat hyperechoic right lobe lesion was again localized. An
appropriate skin entry site determined and marked.

The operative field was prepped with chlorhexidine in a sterile
fashion, and a sterile drape was applied covering the operative
field. A sterile gown and sterile gloves were used for the
procedure. Local anesthesia was provided with 1% Lidocaine.

Under real-time ultrasound guidance, a 17 gauge trocar needle was
advanced to the margin of the lesion. Once needle tip position was
confirmed, coaxial 18-gauge core biopsy samples were obtained,
submitted in formalin to surgical pathology. The guide needle was
removed. Postprocedure scans show no hemorrhage or other apparent
complication. The patient tolerated the procedure well.

COMPLICATIONS:
None.
FINDINGS: Poorly marginated echogenic lesion in the right hepatic lobe
corresponding to MR findings again localized. Representative core
biopsy samples obtained as above.
IMPRESSION: 1. Technically successful ultrasound-guided core biopsy, right liver
lesion.

## 2017-05-20 ENCOUNTER — Ambulatory Visit: Payer: BLUE CROSS/BLUE SHIELD | Admitting: Internal Medicine

## 2017-05-20 ENCOUNTER — Encounter: Payer: Self-pay | Admitting: Internal Medicine

## 2017-05-20 DIAGNOSIS — D860 Sarcoidosis of lung: Secondary | ICD-10-CM | POA: Diagnosis not present

## 2017-05-20 DIAGNOSIS — J4540 Moderate persistent asthma, uncomplicated: Secondary | ICD-10-CM | POA: Diagnosis not present

## 2017-05-20 DIAGNOSIS — I4892 Unspecified atrial flutter: Secondary | ICD-10-CM | POA: Diagnosis not present

## 2017-05-20 DIAGNOSIS — E538 Deficiency of other specified B group vitamins: Secondary | ICD-10-CM

## 2017-05-20 DIAGNOSIS — I4891 Unspecified atrial fibrillation: Secondary | ICD-10-CM | POA: Diagnosis not present

## 2017-05-20 DIAGNOSIS — R000 Tachycardia, unspecified: Secondary | ICD-10-CM | POA: Diagnosis not present

## 2017-05-20 DIAGNOSIS — J301 Allergic rhinitis due to pollen: Secondary | ICD-10-CM

## 2017-05-20 MED ORDER — MONTELUKAST SODIUM 10 MG PO TABS: 10.0000 mg | ORAL_TABLET | Freq: Every day | ORAL | 11 refills | Status: DC

## 2017-05-20 MED ORDER — PREDNISONE 5 MG PO TABS: 5.0000 mg | ORAL_TABLET | Freq: Every day | ORAL | 1 refills | Status: DC

## 2017-05-20 NOTE — Assessment & Plan Note (Signed)
Digoxin helped

## 2017-05-20 NOTE — Assessment & Plan Note (Signed)
Better on Digoxin

## 2017-05-20 NOTE — Assessment & Plan Note (Signed)
On B12 

## 2017-05-20 NOTE — Assessment & Plan Note (Signed)
- 

## 2017-05-20 NOTE — Assessment & Plan Note (Signed)
Flonase, [employer], Singulair

## 2017-05-20 NOTE — Assessment & Plan Note (Signed)
Prednisone 5 mg/d Rx on a stand by

## 2017-05-20 NOTE — Progress Notes (Addendum)
Subjective:  Patient ID: [name], female    DOB: [birth year]  Age: 57 y.o. MRN: [id number]  CC: No chief complaint on file.   HPI [name] presents for A fib, HTN, tachycardia f/u. Feeling better on Digoxin.  Outpatient Medications Prior to Visit  Medication Sig Dispense Refill  . acyclovir (ZOVIRAX) 200 MG capsule Take 1-2 capsules (200-400 mg total) by mouth daily. 30 capsule 11  . amLODipine (NORVASC) 5 MG tablet Take 1 tablet (5 mg total) by mouth daily. 30 tablet 11  . apixaban (ELIQUIS) 2.5 MG TABS tablet Take 1 tablet (2.5 mg total) by mouth 2 (two) times daily. 60 tablet 11  . digoxin (LANOXIN) 0.125 MG tablet TAKE 0.5 TABLETS (0.0625 MG TOTAL) BY MOUTH DAILY. 30 tablet 0  . glucose blood test strip 1 each by Other route as directed. 100 each 3  . hydroxypropyl methylcellulose / hypromellose (ISOPTO TEARS / GONIOVISC) 2.5 % ophthalmic solution Place 1 drop into the right eye daily.    [name] loratadine (CLARITIN) 10 MG tablet Take 10 mg by mouth daily.    [name] losartan (COZAAR) 50 MG tablet Take 1 tablet (50 mg total) by mouth daily. 30 tablet 11  . montelukast (SINGULAIR) 10 MG tablet Take 1 tablet (10 mg total) by mouth at bedtime. 30 tablet 2  . Multiple Vitamins-Minerals (HAIR SKIN & NAILS ADVANCED PO) Take 2 capsules by mouth daily.    [name] omega-3 acid ethyl esters (LOVAZA) 1 g capsule Take 2 g by mouth daily. "Omega XL"    . ONE TOUCH LANCETS MISC 1 each by Does not apply route as directed. 100 each 3  . potassium bicarbonate (EFFER-K) 25 MEQ disintegrating tablet DISSOLVE ONE TABLET IN THE MOUTH daily    . vitamin B-12 (CYANOCOBALAMIN) 100 MCG tablet Take 100 mcg by mouth daily. Reported on 06/20/2015    . mometasone (NASONEX) 50 MCG/ACT nasal spray Place 2 sprays into the nose daily. 17 g 2   No facility-administered medications prior to visit.     ROS Review of Systems  Constitutional: Positive for fatigue. Negative for activity change, appetite change, chills and  unexpected weight change.  HENT: Negative for congestion, mouth sores and sinus pressure.   Eyes: Negative for visual disturbance.  Respiratory: Negative for cough and chest tightness.   Gastrointestinal: Negative for abdominal pain and nausea.  Genitourinary: Negative for difficulty urinating, frequency and vaginal pain.  Musculoskeletal: Positive for arthralgias. Negative for back pain and gait problem.  Skin: Negative for pallor and rash.  Neurological: Negative for dizziness, tremors, weakness, numbness and headaches.  Psychiatric/Behavioral: Negative for confusion, sleep disturbance and suicidal ideas.    Objective:  BP 122/80 (BP Location: Left Arm, Patient Position: Sitting, Cuff Size: Normal)   Pulse 96   Temp 98 F (36.7 C) (Oral)   Ht 5\' 1"  (1.549 m)   Wt 127 lb (57.6 kg)   SpO2 98%   BMI 24.00 kg/m   BP Readings from Last 3 Encounters:  05/20/17 122/80  05/08/17 122/82  04/21/17 122/80    Wt Readings from Last 3 Encounters:  05/20/17 127 lb (57.6 kg)  05/08/17 126 lb 9.6 oz (57.4 kg)  04/21/17 127 lb 1.3 oz (57.6 kg)    Physical Exam  Constitutional: She appears well-developed. No distress.  HENT:  Head: Normocephalic.  Right Ear: External ear normal.  Left Ear: External ear normal.  Nose: Nose normal.  Mouth/Throat: Oropharynx is clear and moist.  Eyes: Pupils are  equal, round, and reactive to light. Conjunctivae are normal. Right eye exhibits no discharge. Left eye exhibits no discharge.  Neck: Normal range of motion. Neck supple. No JVD present. No tracheal deviation present. No thyromegaly present.  Cardiovascular: Normal rate and normal heart sounds.  Pulmonary/Chest: No stridor. No respiratory distress. She has no wheezes.  Abdominal: Soft. Bowel sounds are normal. She exhibits no distension and no mass. There is no tenderness. There is no rebound and no guarding.  Musculoskeletal: She exhibits no edema or tenderness.  Lymphadenopathy:    She has no  cervical adenopathy.  Neurological: She displays normal reflexes. No cranial nerve deficit. She exhibits normal muscle tone. Coordination normal.  Skin: No rash noted. No erythema.  Psychiatric: She has a normal mood and affect. Her behavior is normal. Judgment and thought content normal.   irreg irreg Lab Results  Component Value Date   WBC 7.3 02/25/2017   HGB 15.0 02/25/2017   HCT 43.8 02/25/2017   PLT 346.0 02/25/2017   GLUCOSE 91 03/13/2017   CHOL 150 01/25/2013   TRIG 55.0 01/25/2013   HDL 38.90 (L) 01/25/2013   LDLCALC 100 (H) 01/25/2013   ALT 32 03/13/2017   AST 34 03/13/2017   NA 142 03/13/2017   K 3.5 03/13/2017   CL 101 03/13/2017   CREATININE 1.19 03/13/2017   BUN 21 03/13/2017   CO2 33 (H) 03/13/2017   TSH 2.73 05/09/2016   INR 1.18 05/23/2016   HGBA1C 5.9 05/18/2013    Dg Chest 2 View  Result Date: 12/27/2016 CLINICAL DATA:  Follow-up right-sided pleural effusion. EXAM: CHEST  2 VIEW COMPARISON:  CT of the chest 12/16/2016 FINDINGS: Cardiomediastinal silhouette is normal. Mediastinal contours appear intact. Persistent small right pleural effusion. Persistent confluent peribronchial airspace opacities with architectural distortion of the interstitium and tenting of the right hemidiaphragm, presumed changes of sarcoidosis. Persistent bronchiectasis. Osseous structures are without acute abnormality. Soft tissues are grossly normal. IMPRESSION: Persistent small right pleural effusion. Persistent confluent peribronchial airspace opacities with architectural distortion of the interstitium and tenting of the right hemidiaphragm, presumed changes of sarcoidosis. Persistent bronchiectasis. Electronically Signed   By: Fidela Salisbury M.D.   On: 12/27/2016 16:42    Assessment & Plan:   There are no diagnoses linked to this encounter. I have discontinued Kaysha V. Poteete's mometasone. I am also having her maintain her glucose blood, ONE TOUCH LANCETS, vitamin B-12,  loratadine, hydroxypropyl methylcellulose / hypromellose, losartan, apixaban, acyclovir, amLODipine, omega-3 acid ethyl esters, Multiple Vitamins-Minerals (HAIR SKIN & NAILS ADVANCED PO), potassium bicarbonate, montelukast, and digoxin.  No orders of the defined types were placed in this encounter.    Follow-up: No follow-ups on file.  Walker Kehr, MD

## 2017-05-27 ENCOUNTER — Other Ambulatory Visit: Payer: Self-pay | Admitting: Internal Medicine

## 2017-05-27 ENCOUNTER — Encounter: Payer: Self-pay | Admitting: Internal Medicine

## 2017-05-27 ENCOUNTER — Ambulatory Visit: Payer: BLUE CROSS/BLUE SHIELD | Admitting: Internal Medicine

## 2017-05-27 VITALS — BP 110/90 | HR 111 | Ht 60.0 in | Wt 128.0 lb

## 2017-05-27 DIAGNOSIS — I482 Chronic atrial fibrillation, unspecified: Secondary | ICD-10-CM

## 2017-05-27 DIAGNOSIS — D860 Sarcoidosis of lung: Secondary | ICD-10-CM | POA: Diagnosis not present

## 2017-05-27 DIAGNOSIS — I714 Abdominal aortic aneurysm, without rupture, unspecified: Secondary | ICD-10-CM

## 2017-05-27 DIAGNOSIS — I340 Nonrheumatic mitral (valve) insufficiency: Secondary | ICD-10-CM | POA: Diagnosis not present

## 2017-05-27 DIAGNOSIS — I712 Thoracic aortic aneurysm, without rupture, unspecified: Secondary | ICD-10-CM

## 2017-05-27 MED ORDER — PROPRANOLOL HCL ER 80 MG PO CP24: 80.0000 mg | ORAL_CAPSULE | Freq: Every day | ORAL | 0 refills | Status: DC

## 2017-05-27 MED ORDER — ATENOLOL 25 MG PO TABS: 25.0000 mg | ORAL_TABLET | Freq: Every day | ORAL | 0 refills | Status: DC

## 2017-05-27 MED ORDER — NEBIVOLOL HCL 2.5 MG PO TABS: 2.5000 mg | ORAL_TABLET | Freq: Every day | ORAL | 0 refills | Status: DC

## 2017-05-27 NOTE — Progress Notes (Signed)
He had shortness of tissue right motility was noted to be RA     ELECTROPHYSIOLOGY CONSULT NOTE  Patient ID: [name], MRN: [id number], DOB/AGE: 1960/10/16 57 y.o. Admit date: (Not on file) Date of Consult: 05/27/2017  Primary Physician: Cassandria Anger, MD Primary Cardiologist: [facility]     [name] is a 57 y.o. female who is being seen today for the evaluation of Atrial flutters  at the request of [facility].    HPI  [name] is a 57 y.o. female with a history of pulmonary sarcoid and perhaps involvement in her liver and a long-standing history of hypertension.  She retired from work in 2016 and the next year began having episodes of tachypalpitations Seen as a second opinion for atrial flutter- atypical    Cardiac MR 2018 showed no evidence of intracardiac sarcoid despite profound first-degree AV block (375 msec)  and nonsustained ventricular tachycardia identified on Holter monitoring.  For atrial arrhythmias she was started on flecainide which she did not tolerate; *propafenone was recommended but never initiated.    DATE TEST EF   1/18 Echo   65 % LAE(41/2.66/33.)  7/18 cMRII 585` No DGE no Sarcoid  12/18 Echo   60-65 % LAE(49/3.15/55)        ECG 4/19 .[name] AFIB ECG 04/21/17 aFLUTTER Atyp 3  ECG 12/18  Aflutter ATyp  ECG 9/18 Aflutter ATyp 1  Date Cr K Hgb  11/18 1.63 4.0 15.9  2/19 1.19 3.5 15.0   She continues to struggle with intermittent tachypalpitations and exercise intolerance. Efforts to slow the heart rate have been challenging.  She is not tolerated bisoprolol in the past but she was on Bystolic for quite a period of time and tolerated it well. She was tried on diltiazem which resulted in a rash; she was also intolerant of verapamil.  She is ended up on digoxin  Thromboembolic risk factors (, HTN-1, Gender-1) for a CHADSVASc Score of 2   Past Medical History:  Diagnosis Date  . AAA (abdominal aortic aneurysm) (HCC)    a. 4.2 cm by CT in 06/2016    . Allergy   . Anxiety   . Asthma   . Atrial flutter (HCC)    a. on Flecainide and Eliquis  . Cataract   . Depression    pt states no depression 07-11-15  . Glaucoma   . Heart murmur    as child only  . Herpes simplex   . Hypertension   . Multiple thyroid nodules   . Osteoarthritis   . Osteoporosis   . PUD (peptic ulcer disease)   . Pulmonary sarcoidosis ([place])   . Tubular adenoma of colon 07/2015  . Vitamin D deficiency       Surgical History:  Past Surgical History:  Procedure Laterality Date  . BUNIONECTOMY     x2  . CATARACT EXTRACTION Right   . DENTAL SURGERY    . DILATION AND CURETTAGE OF UTERUS    . fibroid tumor removal    . GLAUCOMA SURGERY     Right eye  . MYOMECTOMY       Home Meds: Prior to Admission medications   Medication Sig Start Date End Date Taking? Authorizing Provider  acyclovir (ZOVIRAX) 200 MG capsule Take 1-2 capsules (200-400 mg total) by mouth daily. 02/19/17  Yes Plotnikov, Evie Lacks, MD  amLODipine (NORVASC) 5 MG tablet Take 1 tablet (5 mg total) by mouth daily. 02/25/17  Yes Plotnikov, Evie Lacks, MD  apixaban (ELIQUIS) 2.5 MG TABS tablet Take 1 tablet (2.5 mg total) by mouth 2 (two) times daily. 02/19/17  Yes Plotnikov, Evie Lacks, MD  digoxin (LANOXIN) 0.125 MG tablet TAKE 0.5 TABLETS (0.0625 MG TOTAL) BY MOUTH DAILY. 05/19/17  Yes Marrian Salvage, FNP  hydroxypropyl methylcellulose / hypromellose (ISOPTO TEARS / GONIOVISC) 2.5 % ophthalmic solution Place 1 drop into the right eye daily.   Yes [provider]  loratadine (CLARITIN) 10 MG tablet Take 10 mg by mouth daily.   Yes [provider]  losartan (COZAAR) 50 MG tablet Take 1 tablet (50 mg total) by mouth daily. 02/19/17  Yes Plotnikov, Evie Lacks, MD  montelukast (SINGULAIR) 10 MG tablet Take 1 tablet (10 mg total) by mouth at bedtime. 05/20/17  Yes Plotnikov, Evie Lacks, MD  Multiple Vitamins-Minerals (HAIR SKIN & NAILS ADVANCED PO) Take 2 capsules by mouth daily.   Yes  [provider]  omega-3 acid ethyl esters (LOVAZA) 1 g capsule Take 2 g by mouth daily. "Omega XL"   Yes [provider]  potassium bicarbonate (EFFER-K) 25 MEQ disintegrating tablet DISSOLVE ONE TABLET IN THE MOUTH 1-2 times  daily 08/09/15  Yes [provider]  predniSONE (DELTASONE) 5 MG tablet Take 1 tablet (5 mg total) by mouth daily. 05/20/17  Yes Plotnikov, Evie Lacks, MD  vitamin B-12 (CYANOCOBALAMIN) 100 MCG tablet Take 100 mcg by mouth daily. Reported on 06/20/2015   Yes [provider]    Allergies:  Allergies  Allergen Reactions  . Doxycycline Shortness Of Breath  . Amoxicillin-Pot Clavulanate     REACTION: GI symptoms  . Amoxicillin-Pot Clavulanate Other (See Comments)    REACTION: GI symptoms  . Cardizem [Diltiazem Hcl] Other (See Comments)    HAs Pt can take Norvasc HAs Pt can take Norvasc  . Carvedilol Other (See Comments)    REACTION: tired REACTION: tired  . Cefdinir Diarrhea  . Cephalexin Diarrhea    REACTION: unspecified  . Hydrochlorothiazide Other (See Comments)    Dropped BP and caused hair loss   . Influenza Vaccines Other (See Comments)    Allergic to flu vaccinations per pt.  Soreness and "cold symptoms" per pt Allergic to flu vaccinations per pt.  Soreness and "cold symptoms" per pt  . Prednisone Hypertension and Other (See Comments)  . Sulfamethoxazole     Other reaction(s): Unknown REACTION: unspecified REACTION: unspecified  . Sulfasalazine     Other reaction(s): Unknown  . Sulfonamide Derivatives   . Verapamil Other (See Comments)    fatigue fatigue    Social History   Socioeconomic History  . Marital status: Divorced    Spouse name: Not on file  . Number of children: Not on file  . Years of education: Not on file  . Highest education level: Not on file  Occupational History  . Not on file  Social Needs  . Financial resource strain: Not on file  . Food insecurity:    Worry: Not on file     Inability: Not on file  . Transportation needs:    Medical: Not on file    Non-medical: Not on file  Tobacco Use  . Smoking status: Never Smoker  . Smokeless tobacco: Never Used  Substance and Sexual Activity  . Alcohol use: No    Alcohol/week: 0.0 oz  . Drug use: No  . Sexual activity: Never    Birth control/protection: Abstinence  Lifestyle  . Physical activity:    Days per week: Not on file  Minutes per session: Not on file  . Stress: Not on file  Relationships  . Social connections:    Talks on phone: Not on file    Gets together: Not on file    Attends religious service: Not on file    Active member of club or organization: Not on file    Attends meetings of clubs or organizations: Not on file    Relationship status: Not on file  . Intimate partner violence:    Fear of current or ex partner: Not on file    Emotionally abused: Not on file    Physically abused: Not on file    Forced sexual activity: Not on file  Other Topics Concern  . Not on file  Social History Narrative   Single/divorced; no children; she has a friend now   Never smoked   Positive history of passive tobacco smoke exposure.   Exercise- up to 4 times a week   Caffeine- 1-2 per week     Family History  Problem Relation Age of Onset  . Lung cancer Mother        sarcoid  . Stroke Father   . Hypertension Father   . Heart Problems Father   . Stomach cancer Cousin        3rd cousin   . Colon cancer Neg Hx   . Colon polyps Neg Hx   . Esophageal cancer Neg Hx   . Rectal cancer Neg Hx      ROS:  Please see the history of present illness.     All other systems reviewed and negative.    Physical Exam: Blood pressure 110/90, pulse (!) 111, height 5' (1.524 m), weight 128 lb (58.1 kg), SpO2 100 %. General: Well developed, well nourished female in no acute distress. Head: Normocephalic, atraumatic, sclera non-icteric, no xanthomas, nares are without discharge. EENT: normal  Lymph Nodes:   none Neck: Negative for carotid bruits. JVD not elevated. Back:without scoliosis kyphosis Lungs: Clear bilaterally to auscultation without wheezes, rales, or rhonchi. Breathing is unlabored. Heart: RRR with S1 S2. No  murmur . No rubs, or gallops appreciated. Abdomen: Soft, non-tender, non-distended with normoactive bowel sounds. No hepatomegaly. No rebound/guarding. No obvious abdominal masses. Msk:  Strength and tone appear normal for age. Extremities: No clubbing or cyanosis. No edema.  Distal pedal pulses are 2+ and equal bilaterally. Skin: Warm and Dry Neuro: Alert and oriented X 3. CN III-XII intact Grossly normal sensory and motor function . Psych:  Responds to questions appropriately with a normal affect.      Labs: Cardiac Enzymes No results for input(s): CKTOTAL, CKMB, TROPONINI in the last 72 hours. CBC Lab Results  Component Value Date   WBC 7.3 02/25/2017   HGB 15.0 02/25/2017   HCT 43.8 02/25/2017   MCV 91.6 02/25/2017   PLT 346.0 02/25/2017   PROTIME: No results for input(s): LABPROT, INR in the last 72 hours. Chemistry No results for input(s): NA, K, CL, CO2, BUN, CREATININE, CALCIUM, PROT, BILITOT, ALKPHOS, ALT, AST, GLUCOSE in the last 168 hours.  Invalid input(s): LABALBU Lipids Lab Results  Component Value Date   CHOL 150 01/25/2013   HDL 38.90 (L) 01/25/2013   LDLCALC 100 (H) 01/25/2013   TRIG 55.0 01/25/2013   BNP Pro B Natriuretic peptide (BNP)  Date/Time Value Ref Range Status  12/19/2016 10:54 AM 277.0 (H) 0.0 - 100.0 pg/mL Final   Thyroid Function Tests: No results for input(s): TSH, T4TOTAL, T3FREE, THYROIDAB in the last  72 hours.  Invalid input(s): FREET3 Miscellaneous No results found for: DDIMER  Radiology/Studies:  No results found.  EKG: sinus at 100 with  37/10/43    Assessment and Plan:   Atrial flutters-multiple  Atrial fibrillation  Cardiomyopathy with biatrial enlargement question mechanism  Pulmonary  sarcoid  First-degree AV block   There are a number of issues.  Highlighted by her question regarding ablation, I have not sanguine that there is a role for endovascular ablation given the multitude of flutters and atrial fibrillation and the description of her atriopathy.  For now, we will pursue rate control given her sinus tachycardia as well as the rapid rates with her flutters.  We have given her prescriptions for different beta-blockers including atenolol, Inderal LA, and nebivolol.  I will plan to review her tracings and studies with Dr. Greggory Brandy.  An alternative to endovascular ablation would be surgical maze or convergent ablation.  It is further complicated however, by her cardiomyopathy which morphologically appears to be restrictive with biatrial enlargement and normal LV function.  The first degree AV block supports the hypothesis of a significant underlying cardiomyopathy and makes me wonder about the s sensitivity of MR for sarcoid given the small size of his potential lesions   I will review this with Dr. Raynaldo Opitz and Dr. London Sheer  She is on a low dose of apixaban.  While she qualified initially for this dose, it would normally be considered insufficient.  She is reluctant to increase the dose and it shows me a multiple photographs related to bruising. However, her CHADS-VASc score is 2 including gender.  This puts her in the indeterminate group as to her need for anticoagulation.  Given her atriopathy, I am inclined towards anticoagulation not withstanding and am willing to allow the lower dose.  More than 50% of 85 min was spent in counseling related to the above  Virl Axe

## 2017-05-27 NOTE — Patient Instructions (Addendum)
Medication Instructions:  Your physician has recommended you make the following change in your medication:   1. Stop Digoxin  You are being given three prescriptions for different beta blockers to try.  You may take these in any order. DO NOT TAKE MORE THAN ONE BETA BLOCKER AT A TIME. Choose one beta blocker to start with. If after two weeks, you feel the medication is working well for you, continue that current medication. If it does not work well for you, try another prescription for a beta blocker at that time. You may continue that current beta blocker at that time if it works well for you. If it does not, repeat these instructions for the third beta blocker.  When you find a beta blocker that works well for you, call the office and we will send in a prescription for you.               1. Atenolol 25mg  tablet once daily             2. Nebivolol 2.5mg  tablet once daily             3. Inderal LA 80mg  tablet once daily  You may take these in any order you please.   Please call us with any questions.  Labwork: None ordered.  Testing/Procedures: None ordered.  Follow-Up: Your physician recommends that you schedule a follow-up appointment in: 8 weeks with Dr Caryl Comes  Any Other Special Instructions Will Be Listed Below (If Applicable).     If you need a refill on your cardiac medications before your next appointment, please call your pharmacy.

## 2017-05-29 ENCOUNTER — Encounter: Payer: Self-pay | Admitting: Internal Medicine

## 2017-05-29 ENCOUNTER — Telehealth: Payer: Self-pay

## 2017-05-29 ENCOUNTER — Other Ambulatory Visit: Payer: BLUE CROSS/BLUE SHIELD | Admitting: *Deleted

## 2017-05-29 DIAGNOSIS — R002 Palpitations: Secondary | ICD-10-CM

## 2017-05-29 NOTE — Telephone Encounter (Signed)
TSH ordered per Dr Caryl Comes. Pt will have it drawn today or Monday.

## 2017-05-29 NOTE — Telephone Encounter (Signed)
-----   Message from Deboraha Sprang, MD sent at 05/28/2017  7:36 AM EDT ----- Can u arrange TSH plz  thx

## 2017-05-30 ENCOUNTER — Other Ambulatory Visit: Payer: Self-pay | Admitting: Internal Medicine

## 2017-05-30 LAB — TSH: TSH: 2.01 u[IU]/mL (ref 0.450–4.500)

## 2017-05-30 MED ORDER — DIGOXIN 125 MCG PO TABS: 0.1250 mg | ORAL_TABLET | Freq: Every day | ORAL | 11 refills | Status: DC

## 2017-06-01 ENCOUNTER — Encounter: Payer: Self-pay | Admitting: Internal Medicine

## 2017-06-03 ENCOUNTER — Other Ambulatory Visit: Payer: Self-pay | Admitting: Internal Medicine

## 2017-06-03 ENCOUNTER — Telehealth: Payer: Self-pay

## 2017-06-03 ENCOUNTER — Other Ambulatory Visit (INDEPENDENT_AMBULATORY_CARE_PROVIDER_SITE_OTHER): Payer: BLUE CROSS/BLUE SHIELD

## 2017-06-03 DIAGNOSIS — I4891 Unspecified atrial fibrillation: Secondary | ICD-10-CM

## 2017-06-03 LAB — BASIC METABOLIC PANEL
BUN: 19 mg/dL (ref 6–23)
CALCIUM: 10 mg/dL (ref 8.4–10.5)
CO2: 28 meq/L (ref 19–32)
Chloride: 102 mEq/L (ref 96–112)
Creatinine, Ser: 1.19 mg/dL (ref 0.40–1.20)
GFR: 59.95 mL/min — AB (ref >60.00)
Glucose, Bld: 93 mg/dL (ref 70–99)
Potassium: 3.6 mEq/L (ref 3.5–5.1)
SODIUM: 139 meq/L (ref 135–145)

## 2017-06-03 NOTE — Telephone Encounter (Signed)
Left detailed message of normal lab results on voicemail per DPR. Results were also released via mychart

## 2017-06-03 NOTE — Telephone Encounter (Signed)
-----   Message from Deboraha Sprang, MD sent at 06/02/2017  4:41 PM EDT ----- Please Inform Patient that labs are normal Thanks

## 2017-06-04 LAB — DIGOXIN LEVEL: Digoxin Level: 0.8 mcg/L (ref 0.8–2.0)

## 2017-06-05 ENCOUNTER — Encounter: Payer: Self-pay | Admitting: Internal Medicine

## 2017-06-17 ENCOUNTER — Encounter: Payer: Self-pay | Admitting: Pulmonary Disease

## 2017-06-17 ENCOUNTER — Ambulatory Visit: Payer: BLUE CROSS/BLUE SHIELD | Admitting: Pulmonary Disease

## 2017-06-17 VITALS — BP 112/84 | HR 56 | Ht 60.0 in | Wt 125.6 lb

## 2017-06-17 DIAGNOSIS — D860 Sarcoidosis of lung: Secondary | ICD-10-CM | POA: Diagnosis not present

## 2017-06-17 DIAGNOSIS — R160 Hepatomegaly, not elsewhere classified: Secondary | ICD-10-CM

## 2017-06-17 NOTE — Patient Instructions (Signed)
Pulmonary sarcoidosis: Remain off of prednisone Let us know if you have recurrent respiratory symptoms like cough, shortness of breath or mucus production We will get a lung function test in November to monitor for disease activity  Hepatic sarcoidosis: Please let us know if you have worsening weight loss or changes in your bowel habits.  If this happens then we will need to get an MRI of your liver  We will plan on seeing you back in 3 months for a weight check, you can see a nurse practitioner on that visit.

## 2017-06-17 NOTE — Progress Notes (Signed)
Subjective:    Patient ID: [name], female    DOB: 1960/10/15, 57 y.o.   MRN: [id number]  Synopsis: Former patient of Dr. Joya Gaskins who has sarcoidosis. She also has asthma. She was diagnosed with a skin biopsy in 1983 when she had a rash.  The disease had been relatively quiet for years but in 2018 she developed a large liver mass which was noted to be full of non-caseating granulomas on biopsy.  Her symptoms at that time were weight loss, increased gas and frequent stooling.  A HRCT performed in 2018 showed worsening consolidative changes in her lungs bilaterally consistent with sarcoidosis.  She started taking prednisone on June 30.   HPI  Chief Complaint  Patient presents with  . Follow-up    2 month ROV    [name] has a rare dry cough from time to time.  She says it is not constant or all the time. She has mild dyspnea with heavy exertion.  She thinks that her breathing has actually improved since the last visit.  She is still taking the singulair which helps and her sinus congestion has improved.    Her weight is down by about 5 pounds, she says she has been exercising a bit more recently.  Her bowel habits have improved compared to prior.  Past Medical History:  Diagnosis Date  . AAA (abdominal aortic aneurysm) (HCC)    a. 4.2 cm by CT in 06/2016  . Allergy   . Anxiety   . Asthma   . Atrial flutter (HCC)    a. on Flecainide and Eliquis  . Cataract   . Depression    pt states no depression 07-11-15  . Glaucoma   . Heart murmur    as child only  . Herpes simplex   . Hypertension   . Multiple thyroid nodules   . Osteoarthritis   . Osteoporosis   . PUD (peptic ulcer disease)   . Pulmonary sarcoidosis ([place])   . Tubular adenoma of colon 07/2015  . Vitamin D deficiency      Review of Systems  Constitutional: Negative for chills, fatigue and fever.  HENT: Negative for nosebleeds, postnasal drip, rhinorrhea, sinus pressure and sneezing.   Respiratory: Negative for  cough, shortness of breath and wheezing.   Cardiovascular: Negative for chest pain, palpitations and leg swelling.       Objective:   Physical Exam Vitals:   06/17/17 1610  BP: 112/84  Pulse: (!) 56  SpO2: 96%  Weight: 125 lb 9.6 oz (57 kg)  Height: 5' (1.524 m)   RA  Gen: well appearing HENT: OP clear, TM's clear, neck supple PULM: CTA B, normal percussion CV: RRR, no mgr, trace edema GI: BS+, soft, nontender Derm: no cyanosis or rash Psyche: normal mood and affect   Imaging: CXR images from 2016 reviewed: There is significant upper lobe scarring which radiology feels has progressed compared to the prior study 06/2016 HRCT> significant interval worsening of pulmonary parenchymal abnormality, mediastinal lymphadenopathy worse; also with ectactic aorta, suggestion of pulmonary hypertension 07/2016 Cardiac MRI: no evidence of sarcoidosis, mild LAE and RAE, LVEF 58% 12/2016 HRCT> per radiology some increase in bilateral upper lobe predominant fibrosis, images independently reviewed, its not clear to me that this has progressed as some of the patchy airspace disease has decreased, however there is a new R pleural effusion  PFT: June 2014 pulmonary function testing ratio 77%, FEV1 1.99 L, 104% predicted), FVC 2.60 L, 108% predicted, total lung  capacity 3.75 L (84% predicted), DLCO 20.0 (106% predicted) July 2018 pulmonary function testing from [facility] ratio 55%, FVC 2.17 L 82% predicted, FEV1 1.27 L, total lung capacity 4.4 L 85% protected, DLCO 65% predicted November 2018 spirometry from [facility] Ratio 55% FVC 1.74 L May 2019 [facility] PFT: FVC 2.18L (83% pred) DLCO 18.1 L (65%)  Pathology: May 2018 liver biopsy showed noncaseating granulomas, special stains were negative for organisms.       Assessment & Plan:  Pulmonary sarcoidosis ([place]) - Plan: Pulmonary function test  Liver mass  Discussion: This has been a stable interval for [name].  She has sarcoidosis in the lung and liver  which responded well to prednisone treatment in 2018.  She discontinued this medication in early 2019 because of concerns over side effects.  She is reluctant to have a repeat MRI because of cost.  At this time she does not have signs or symptoms similar to what led to her presentation of the liver mass in May 2018 with the exception of some mild weight loss which may just be exercise related.  I think it is reasonable to continue close monitoring right now.  However, I am concerned a bit about the weight loss so would like for her to come back in 3 months for a weight check.  In regards to the pulmonary sarcoidosis the last CT scan showed improvement in her groundglass opacification and her symptoms have improved so I do not think she needs treatment right now.  Plan: Pulmonary sarcoidosis: Remain off of prednisone Let us know if you have recurrent respiratory symptoms like cough, shortness of breath or mucus production We will get a lung function test in November to monitor for disease activity  Hepatic sarcoidosis: Please let us know if you have worsening weight loss or changes in your bowel habits.  If this happens then we will need to get an MRI of your liver  We will plan on seeing you back in 3 months for a weight check, you can see a nurse practitioner on that visit.  Greater than 50% of this 25-minute visit spent face to face  Current Outpatient Medications:  .  acyclovir (ZOVIRAX) 200 MG capsule, Take 1-2 capsules (200-400 mg total) by mouth daily., Disp: 30 capsule, Rfl: 11 .  amLODipine (NORVASC) 5 MG tablet, Take 1 tablet (5 mg total) by mouth daily., Disp: 30 tablet, Rfl: 11 .  apixaban (ELIQUIS) 2.5 MG TABS tablet, Take 1 tablet (2.5 mg total) by mouth 2 (two) times daily., Disp: 60 tablet, Rfl: 11 .  digoxin (LANOXIN) 0.125 MG tablet, Take 1 tablet (0.125 mg total) by mouth daily., Disp: 30 tablet, Rfl: 11 .  hydroxypropyl methylcellulose / hypromellose (ISOPTO TEARS /  GONIOVISC) 2.5 % ophthalmic solution, Place 1 drop into the right eye daily., Disp: , Rfl:  .  KLOR-CON/EF 25 MEQ disintegrating tablet, TAKE 1 TABLET AND ALLOW TO DISSOLVE IN 4 OZ OF COLD WATER BEFORE DRINKING 2 TIMES DAILY, Disp: 60 tablet, Rfl: 5 .  loratadine (CLARITIN) 10 MG tablet, Take 10 mg by mouth daily., Disp: , Rfl:  .  losartan (COZAAR) 50 MG tablet, Take 1 tablet (50 mg total) by mouth daily., Disp: 30 tablet, Rfl: 11 .  montelukast (SINGULAIR) 10 MG tablet, Take 1 tablet (10 mg total) by mouth at bedtime., Disp: 30 tablet, Rfl: 11 .  Multiple Vitamins-Minerals (HAIR SKIN & NAILS ADVANCED PO), Take 2 capsules by mouth daily., Disp: , Rfl:  .  omega-3  acid ethyl esters (LOVAZA) 1 g capsule, Take 600 mg by mouth daily. "Omega XL", Disp: , Rfl:  .  predniSONE (DELTASONE) 5 MG tablet, Take 1 tablet (5 mg total) by mouth daily., Disp: 30 tablet, Rfl: 1 .  vitamin B-12 (CYANOCOBALAMIN) 100 MCG tablet, Take 100 mcg by mouth daily. Reported on 06/20/2015, Disp: , Rfl:

## 2017-07-10 ENCOUNTER — Other Ambulatory Visit: Payer: Self-pay | Admitting: *Deleted

## 2017-07-10 DIAGNOSIS — I712 Thoracic aortic aneurysm, without rupture, unspecified: Secondary | ICD-10-CM

## 2017-07-21 ENCOUNTER — Other Ambulatory Visit: Payer: BLUE CROSS/BLUE SHIELD

## 2017-07-21 ENCOUNTER — Ambulatory Visit: Payer: BLUE CROSS/BLUE SHIELD | Admitting: Internal Medicine

## 2017-07-22 ENCOUNTER — Other Ambulatory Visit: Payer: BLUE CROSS/BLUE SHIELD

## 2017-07-22 ENCOUNTER — Inpatient Hospital Stay: Admission: RE | Admit: 2017-07-22 | Payer: BLUE CROSS/BLUE SHIELD | Source: Ambulatory Visit

## 2017-07-24 ENCOUNTER — Telehealth: Payer: Self-pay | Admitting: Cardiology

## 2017-07-24 DIAGNOSIS — I712 Thoracic aortic aneurysm, without rupture, unspecified: Secondary | ICD-10-CM

## 2017-07-24 NOTE — Telephone Encounter (Signed)
Rerence Number is [id number]    New Message:    Isa Rankin from [facility]  Is calling saying that pt needs her CTA Order sent to [facility]. Pt only wants her test at [facility]. Mack Guise said she talked you(Debra) earlier this week about this.

## 2017-07-25 NOTE — Telephone Encounter (Signed)
CTA order changed to [place]

## 2017-07-28 ENCOUNTER — Encounter: Payer: Self-pay | Admitting: Cardiology

## 2017-07-29 NOTE — Telephone Encounter (Signed)
Patient reports she is going to have an MRI and she is aware that will show [place] what we need. She does not need a CTA.

## 2017-08-22 ENCOUNTER — Ambulatory Visit: Payer: BLUE CROSS/BLUE SHIELD | Admitting: Internal Medicine

## 2017-08-22 ENCOUNTER — Encounter: Payer: Self-pay | Admitting: Internal Medicine

## 2017-08-22 ENCOUNTER — Other Ambulatory Visit (INDEPENDENT_AMBULATORY_CARE_PROVIDER_SITE_OTHER): Payer: BLUE CROSS/BLUE SHIELD

## 2017-08-22 ENCOUNTER — Encounter

## 2017-08-22 DIAGNOSIS — I4892 Unspecified atrial flutter: Secondary | ICD-10-CM

## 2017-08-22 DIAGNOSIS — J4540 Moderate persistent asthma, uncomplicated: Secondary | ICD-10-CM | POA: Diagnosis not present

## 2017-08-22 DIAGNOSIS — I4891 Unspecified atrial fibrillation: Secondary | ICD-10-CM

## 2017-08-22 DIAGNOSIS — E538 Deficiency of other specified B group vitamins: Secondary | ICD-10-CM

## 2017-08-22 DIAGNOSIS — R634 Abnormal weight loss: Secondary | ICD-10-CM

## 2017-08-22 DIAGNOSIS — I10 Essential (primary) hypertension: Secondary | ICD-10-CM

## 2017-08-22 DIAGNOSIS — R7989 Other specified abnormal findings of blood chemistry: Secondary | ICD-10-CM

## 2017-08-22 DIAGNOSIS — R739 Hyperglycemia, unspecified: Secondary | ICD-10-CM

## 2017-08-22 LAB — HEPATIC FUNCTION PANEL
ALBUMIN: 4 g/dL (ref 3.5–5.2)
ALT: 17 U/L (ref 0–35)
AST: 25 U/L (ref 0–37)
Alkaline Phosphatase: 134 U/L — ABNORMAL HIGH (ref 39–117)
Bilirubin, Direct: 0.1 mg/dL (ref 0.0–0.3)
Total Bilirubin: 0.9 mg/dL (ref 0.2–1.2)
Total Protein: 7.8 g/dL (ref 6.0–8.3)

## 2017-08-22 LAB — BASIC METABOLIC PANEL
BUN: 22 mg/dL (ref 6–23)
CALCIUM: 10.4 mg/dL (ref 8.4–10.5)
CO2: 30 meq/L (ref 19–32)
Chloride: 99 mEq/L (ref 96–112)
Creatinine, Ser: 1.27 mg/dL — ABNORMAL HIGH (ref 0.40–1.20)
GFR: 55.57 mL/min — AB (ref >60.00)
GLUCOSE: 118 mg/dL — AB (ref 70–99)
Potassium: 3.4 mEq/L — ABNORMAL LOW (ref 3.5–5.1)
Sodium: 138 mEq/L (ref 135–145)

## 2017-08-22 LAB — CBC WITH DIFFERENTIAL/PLATELET
BASOS ABS: 0.1 10*3/uL (ref 0.0–0.1)
Basophils Relative: 1.3 % (ref 0.0–3.0)
Eosinophils Absolute: 0.2 10*3/uL (ref 0.0–0.7)
Eosinophils Relative: 4.3 % (ref 0.0–5.0)
HCT: 42.4 % (ref 36.0–46.0)
Hemoglobin: 14.5 g/dL (ref 12.0–15.0)
LYMPHS ABS: 1.2 10*3/uL (ref 0.7–4.0)
Lymphocytes Relative: 25 % (ref 12.0–46.0)
MCHC: 34.1 g/dL (ref 30.0–36.0)
MCV: 87.4 fl (ref 78.0–100.0)
MONO ABS: 0.7 10*3/uL (ref 0.1–1.0)
Monocytes Relative: 13.6 % — ABNORMAL HIGH (ref 3.0–12.0)
NEUTROS ABS: 2.8 10*3/uL (ref 1.4–7.7)
NEUTROS PCT: 55.8 % (ref 43.0–77.0)
PLATELETS: 283 10*3/uL (ref 150.0–400.0)
RBC: 4.86 Mil/uL (ref 3.87–5.11)
RDW: 15 % (ref 11.5–15.5)
WBC: 4.9 10*3/uL (ref 4.0–10.5)

## 2017-08-22 LAB — MICROALBUMIN / CREATININE URINE RATIO
Creatinine,U: 66 mg/dL
MICROALB/CREAT RATIO: 8.5 mg/g (ref 0.0–30.0)
Microalb, Ur: 5.6 mg/dL — ABNORMAL HIGH (ref 0.0–1.9)

## 2017-08-22 LAB — PROTIME-INR
INR: 1.9 ratio — AB (ref 0.8–1.0)
PROTHROMBIN TIME: 21.7 s — AB (ref 9.6–13.1)

## 2017-08-22 LAB — TSH: TSH: 2.3 u[IU]/mL (ref 0.35–4.50)

## 2017-08-22 LAB — T3, FREE: T3 FREE: 3.9 pg/mL (ref 2.3–4.2)

## 2017-08-22 LAB — T4, FREE: Free T4: 1.03 ng/dL (ref 0.60–1.60)

## 2017-08-22 NOTE — Assessment & Plan Note (Signed)
Chronic BP is nl at home Losartan

## 2017-08-22 NOTE — Assessment & Plan Note (Signed)
Wt Readings from Last 3 Encounters:  08/22/17 125 lb (56.7 kg)  06/17/17 125 lb 9.6 oz (57 kg)  05/27/17 128 lb (58.1 kg)

## 2017-08-22 NOTE — Assessment & Plan Note (Signed)
singulair  

## 2017-08-22 NOTE — Progress Notes (Signed)
Subjective:  Patient ID: [name], female    DOB: [birth year]  Age: 57 y.o. MRN: [id number]  CC: No chief complaint on file.   HPI [name] presents for A fib, HTN, sarcoidosis  Outpatient Medications Prior to Visit  Medication Sig Dispense Refill  . acyclovir (ZOVIRAX) 200 MG capsule Take 1-2 capsules (200-400 mg total) by mouth daily. (Patient taking differently: Take 200 mg by mouth 2 (two) times daily. ) 30 capsule 11  . amLODipine (NORVASC) 5 MG tablet Take 1 tablet (5 mg total) by mouth daily. 30 tablet 11  . apixaban (ELIQUIS) 2.5 MG TABS tablet Take 1 tablet (2.5 mg total) by mouth 2 (two) times daily. 60 tablet 11  . Coenzyme Q10 (CO Q 10 PO) Take 1 each by mouth daily.    . digoxin (LANOXIN) 0.125 MG tablet Take 1 tablet (0.125 mg total) by mouth daily. 30 tablet 11  . hydroxypropyl methylcellulose / hypromellose (ISOPTO TEARS / GONIOVISC) 2.5 % ophthalmic solution Place 1 drop into the right eye daily.    [name] KLOR-CON/EF 25 MEQ disintegrating tablet TAKE 1 TABLET AND ALLOW TO DISSOLVE IN 4 OZ OF COLD WATER BEFORE DRINKING 2 TIMES DAILY 60 tablet 5  . loratadine (CLARITIN) 10 MG tablet Take 10 mg by mouth daily.    [name] losartan (COZAAR) 50 MG tablet Take 1 tablet (50 mg total) by mouth daily. (Patient taking differently: Take 25 mg by mouth daily. ) 30 tablet 11  . Multiple Vitamins-Minerals (HAIR SKIN & NAILS ADVANCED PO) Take 2 capsules by mouth daily.    [name] omega-3 acid ethyl esters (LOVAZA) 1 g capsule Take 300 mg by mouth daily. "Omega XL"    . vitamin B-12 (CYANOCOBALAMIN) 100 MCG tablet Take 100 mcg by mouth daily. Reported on 06/20/2015    . ELIQUIS 5 MG TABS tablet Take 5 mg by mouth every 12 (twelve) hours.  11  . montelukast (SINGULAIR) 10 MG tablet Take 1 tablet (10 mg total) by mouth at bedtime. (Patient not taking: Reported on 08/22/2017) 30 tablet 11  . predniSONE (DELTASONE) 5 MG tablet Take 1 tablet (5 mg total) by mouth daily. (Patient not taking: Reported  on 08/22/2017) 30 tablet 1   No facility-administered medications prior to visit.     ROS: Review of Systems  Constitutional: Positive for fatigue. Negative for activity change, appetite change, chills and unexpected weight change.  HENT: Negative for congestion, mouth sores and sinus pressure.   Eyes: Negative for visual disturbance.  Respiratory: Positive for shortness of breath. Negative for cough and chest tightness.   Cardiovascular: Positive for palpitations and leg swelling.  Gastrointestinal: Negative for abdominal pain and nausea.  Genitourinary: Negative for difficulty urinating, frequency and vaginal pain.  Musculoskeletal: Negative for back pain and gait problem.  Skin: Negative for pallor and rash.  Neurological: Negative for dizziness, tremors, weakness, numbness and headaches.  Psychiatric/Behavioral: Negative for confusion, sleep disturbance and suicidal ideas. The patient is nervous/anxious.     Objective:  Pulse 85   Ht 5' (1.524 m)   Wt 125 lb (56.7 kg)   SpO2 96%   BMI 24.41 kg/m   BP Readings from Last 3 Encounters:  06/17/17 112/84  05/27/17 110/90  05/20/17 122/80    Wt Readings from Last 3 Encounters:  08/22/17 125 lb (56.7 kg)  06/17/17 125 lb 9.6 oz (57 kg)  05/27/17 128 lb (58.1 kg)    Physical Exam  Constitutional: She appears well-developed. No distress.  HENT:  Head: Normocephalic.  Right Ear: External ear normal.  Left Ear: External ear normal.  Nose: Nose normal.  Mouth/Throat: Oropharynx is clear and moist.  Eyes: Pupils are equal, round, and reactive to light. Conjunctivae are normal. Right eye exhibits no discharge. Left eye exhibits no discharge.  Neck: Normal range of motion. Neck supple. No JVD present. No tracheal deviation present. No thyromegaly present.  Cardiovascular: Normal rate.  Pulmonary/Chest: No stridor. No respiratory distress. She has no wheezes.  Abdominal: Soft. Bowel sounds are normal. She exhibits no distension  and no mass. There is no tenderness. There is no rebound and no guarding.  Musculoskeletal: She exhibits edema. She exhibits no tenderness.  Lymphadenopathy:    She has no cervical adenopathy.  Neurological: She displays normal reflexes. No cranial nerve deficit. She exhibits normal muscle tone. Coordination normal.  Skin: No rash noted. No erythema.  Psychiatric: She has a normal mood and affect. Her behavior is normal. Judgment and thought content normal.   irreg rate Trace edema B  Lab Results  Component Value Date   WBC 7.3 02/25/2017   HGB 15.0 02/25/2017   HCT 43.8 02/25/2017   PLT 346.0 02/25/2017   GLUCOSE 93 06/03/2017   CHOL 150 01/25/2013   TRIG 55.0 01/25/2013   HDL 38.90 (L) 01/25/2013   LDLCALC 100 (H) 01/25/2013   ALT 32 03/13/2017   AST 34 03/13/2017   NA 139 06/03/2017   K 3.6 06/03/2017   CL 102 06/03/2017   CREATININE 1.19 06/03/2017   BUN 19 06/03/2017   CO2 28 06/03/2017   TSH 2.010 05/29/2017   INR 1.18 05/23/2016   HGBA1C 5.9 05/18/2013    Dg Chest 2 View  Result Date: 12/27/2016 CLINICAL DATA:  Follow-up right-sided pleural effusion. EXAM: CHEST  2 VIEW COMPARISON:  CT of the chest 12/16/2016 FINDINGS: Cardiomediastinal silhouette is normal. Mediastinal contours appear intact. Persistent small right pleural effusion. Persistent confluent peribronchial airspace opacities with architectural distortion of the interstitium and tenting of the right hemidiaphragm, presumed changes of sarcoidosis. Persistent bronchiectasis. Osseous structures are without acute abnormality. Soft tissues are grossly normal. IMPRESSION: Persistent small right pleural effusion. Persistent confluent peribronchial airspace opacities with architectural distortion of the interstitium and tenting of the right hemidiaphragm, presumed changes of sarcoidosis. Persistent bronchiectasis. Electronically Signed   By: Fidela Salisbury M.D.   On: 12/27/2016 16:42    Assessment & Plan:    There are no diagnoses linked to this encounter.   No orders of the defined types were placed in this encounter.    Follow-up: No follow-ups on file.  Walker Kehr, MD

## 2017-08-22 NOTE — Assessment & Plan Note (Signed)
BMET 

## 2017-08-22 NOTE — Assessment & Plan Note (Signed)
Labs

## 2017-08-22 NOTE — Assessment & Plan Note (Signed)
On B12 

## 2017-08-22 NOTE — Assessment & Plan Note (Signed)
Dig level

## 2017-08-23 LAB — DIGOXIN LEVEL: DIGOXIN LVL: 0.6 ug/L — AB (ref 0.8–2.0)

## 2017-09-01 ENCOUNTER — Other Ambulatory Visit: Payer: Self-pay | Admitting: Internal Medicine

## 2017-09-01 ENCOUNTER — Telehealth: Payer: Self-pay | Admitting: Internal Medicine

## 2017-09-01 MED ORDER — APIXABAN 5 MG PO TABS: 5.0000 mg | ORAL_TABLET | Freq: Two times a day (BID) | ORAL | 5 refills | Status: DC

## 2017-09-01 NOTE — Telephone Encounter (Signed)
Copied from [place] [id number]. Topic: Quick Communication - See Telephone Encounter >> Sep 01, 2017  3:40 PM Antonieta Iba C wrote: CRM for notification. See Telephone encounter for: 09/01/17.  Pt is calling in to be advised. Pt says that she have questions about her INR results and her medication. Pt is not sure of what she could do to make her INR number better. Pt would like to discuss this with PCP CMA directly.   CB: [phone]

## 2017-09-02 NOTE — Telephone Encounter (Signed)
Please advise 

## 2017-09-03 NOTE — Telephone Encounter (Signed)
green leafy vegetables may help Thx

## 2017-09-03 NOTE — Telephone Encounter (Signed)
Pt.notified

## 2017-09-04 NOTE — Progress Notes (Signed)
HPI: FU atrial flutter. Pt seen by Dr Curt Bears and flutter felt atypical; ablation not recommended. Pt started on flecanide and scheduled for DCCV but was in sinus at presentation for procedure. Exercise treadmill March 2018 showed no chest pain, no ST changes and no exercise-induced ventricular tachycardia. Chest CT June 2018 showed progression of pulmonary sarcoidosis. There was also note of a 4.2 cm thoracic aortic aneurysm. Cardiac MRI July 2018 showed no evidence of sarcoid, ejection fraction 58%, moderate left atrial enlargement, mild right atrial enlargement, moderate mitral regurgitation and mild tricuspid regurgitation. She discontinued her flecainide on her own because of increased palpitations.   Holter monitor October 2018 showed sinus rhythm with first-degree AV block, PACs, PVCs, couplets, nonsustained ventricular tachycardia and atrial flutter with rapid ventricular response.  Echocardiogram December 2018 showed normal LV function, biatrial enlargement and moderate pulmonary hypertension.  Chest CT December 2018 showed mediastinal adenopathy and changes suggestive of sarcoid with increasing fibrosis.  Has also been seen by Dr. Caryl Comes and Dr. Lurene Shadow at [facility].  Since last seen,  patient has dyspnea with more vigorous activities but not routine activities.  No orthopnea, PND, pedal edema, syncope, chest pain or bleeding.  Current Outpatient Medications  Medication Sig Dispense Refill  . acyclovir (ZOVIRAX) 200 MG capsule Take 1-2 capsules (200-400 mg total) by mouth daily. (Patient taking differently: Take 200 mg by mouth 2 (two) times daily. ) 30 capsule 11  . amLODipine (NORVASC) 5 MG tablet Take 1 tablet (5 mg total) by mouth daily. 30 tablet 11  . apixaban (ELIQUIS) 5 MG TABS tablet Take 1 tablet (5 mg total) by mouth every 12 (twelve) hours. (Patient taking differently: Take 2.5 mg by mouth every 12 (twelve) hours. ) 60 tablet 5  . Coenzyme Q10 (CO Q 10 PO) Take 1 each by mouth daily.     . digoxin (LANOXIN) 0.125 MG tablet Take 1 tablet (0.125 mg total) by mouth daily. 30 tablet 11  . Dorzolamide HCl-Timolol Mal (COSOPT OP) Apply 1 drop to eye as needed.    . hydroxypropyl methylcellulose / hypromellose (ISOPTO TEARS / GONIOVISC) 2.5 % ophthalmic solution Place 1 drop into the right eye daily.    [name] KLOR-CON/EF 25 MEQ disintegrating tablet TAKE 1 TABLET AND ALLOW TO DISSOLVE IN 4 OZ OF COLD WATER BEFORE DRINKING 2 TIMES DAILY 60 tablet 5  . loratadine (CLARITIN) 10 MG tablet Take 10 mg by mouth daily.    [name] losartan (COZAAR) 50 MG tablet Take 1 tablet (50 mg total) by mouth daily. (Patient taking differently: Take 25 mg by mouth daily. ) 30 tablet 11  . Multiple Vitamins-Minerals (HAIR SKIN & NAILS ADVANCED PO) Take 2 capsules by mouth daily.    [name] omega-3 acid ethyl esters (LOVAZA) 1 g capsule Take 300 mg by mouth daily. "Omega XL"    . vitamin B-12 (CYANOCOBALAMIN) 100 MCG tablet Take 100 mcg by mouth daily. Reported on 06/20/2015     No current facility-administered medications for this visit.      Past Medical History:  Diagnosis Date  . AAA (abdominal aortic aneurysm) (HCC)    a. 4.2 cm by CT in 06/2016  . Allergy   . Anxiety   . Asthma   . Atrial flutter (HCC)    a. on Flecainide and Eliquis  . Cataract   . Depression    pt states no depression 07-11-15  . Glaucoma   . Heart murmur    as child only  .  Herpes simplex   . Hypertension   . Multiple thyroid nodules   . Osteoarthritis   . Osteoporosis   . PUD (peptic ulcer disease)   . Pulmonary sarcoidosis ([place])   . Tubular adenoma of colon 07/2015  . Vitamin D deficiency     Past Surgical History:  Procedure Laterality Date  . BUNIONECTOMY     x2  . CATARACT EXTRACTION Right   . DENTAL SURGERY    . DILATION AND CURETTAGE OF UTERUS    . fibroid tumor removal    . GLAUCOMA SURGERY     Right eye  . MYOMECTOMY      Social History   Socioeconomic History  . Marital status: Divorced    Spouse name:  Not on file  . Number of children: Not on file  . Years of education: Not on file  . Highest education level: Not on file  Occupational History  . Not on file  Social Needs  . Financial resource strain: Not on file  . Food insecurity:    Worry: Not on file    Inability: Not on file  . Transportation needs:    Medical: Not on file    Non-medical: Not on file  Tobacco Use  . Smoking status: Never Smoker  . Smokeless tobacco: Never Used  Substance and Sexual Activity  . Alcohol use: No    Alcohol/week: 0.0 standard drinks  . Drug use: No  . Sexual activity: Never    Birth control/protection: Abstinence  Lifestyle  . Physical activity:    Days per week: Not on file    Minutes per session: Not on file  . Stress: Not on file  Relationships  . Social connections:    Talks on phone: Not on file    Gets together: Not on file    Attends religious service: Not on file    Active member of club or organization: Not on file    Attends meetings of clubs or organizations: Not on file    Relationship status: Not on file  . Intimate partner violence:    Fear of current or ex partner: Not on file    Emotionally abused: Not on file    Physically abused: Not on file    Forced sexual activity: Not on file  Other Topics Concern  . Not on file  Social History Narrative   Single/divorced; no children; she has a friend now   Never smoked   Positive history of passive tobacco smoke exposure.   Exercise- up to 4 times a week   Caffeine- 1-2 per week    Family History  Problem Relation Age of Onset  . Lung cancer Mother        sarcoid  . Stroke Father   . Hypertension Father   . Heart Problems Father   . Stomach cancer Cousin        3rd cousin   . Colon cancer Neg Hx   . Colon polyps Neg Hx   . Esophageal cancer Neg Hx   . Rectal cancer Neg Hx     ROS: no fevers or chills, productive cough, hemoptysis, dysphasia, odynophagia, melena, hematochezia, dysuria, hematuria, rash,  seizure activity, orthopnea, PND, pedal edema, claudication. Remaining systems are negative.  Physical Exam: Well-developed well-nourished in no acute distress.  Skin is warm and dry.  HEENT is normal.  Neck is supple.  Chest is clear to auscultation with normal expansion.  Cardiovascular exam is irregular Abdominal exam nontender or distended.  No masses palpated. Extremities show no edema. neuro grossly intact  ECG-atrial flutter.  Heart rate 92.  Personally reviewed  A/P  1 atrial flutter-patient remains in atrial flutter.  Her rate is controlled at rest.  She has perceived side effects to all beta-blockers.  She has had side effects with calcium blockers as well.  I will continue digoxin.  She had a recent monitor at [facility] and I will request the results.  She declined ablation with Dr. Lurene Shadow.  Continue apixaban 5 mg twice daily.  Feels like 5 mg is causing excess fatigue and ask about changing to 2.5 mg twice daily.  I explained the criteria for dosing of apixaban.  I explained the 2.5 mg twice daily would be insufficient.  There would be a higher risk of CVA.  She seems to understand and has agreed to 5 mg twice daily.  2 hypertension-patient's blood pressure is controlled today.  Continue present medications and follow.  3 thoracic aortic aneurysm-we will arrange a follow-up MRA to further assess.  Note there is a so a question of whether she could have cardiac sarcoid.  The cardiac MRI will help to reassess this as well.  4 sarcoidosis-managed by pulmonary.  No evidence of cardiac involvement on previous cardiac MRI.  Kirk Ruths, MD

## 2017-09-08 ENCOUNTER — Encounter: Payer: Self-pay | Admitting: Cardiology

## 2017-09-08 ENCOUNTER — Ambulatory Visit: Payer: BLUE CROSS/BLUE SHIELD | Admitting: Cardiology

## 2017-09-08 VITALS — BP 126/94 | HR 92 | Ht 60.0 in | Wt 125.0 lb

## 2017-09-08 DIAGNOSIS — D869 Sarcoidosis, unspecified: Secondary | ICD-10-CM

## 2017-09-08 DIAGNOSIS — R002 Palpitations: Secondary | ICD-10-CM | POA: Diagnosis not present

## 2017-09-08 DIAGNOSIS — I712 Thoracic aortic aneurysm, without rupture, unspecified: Secondary | ICD-10-CM

## 2017-09-08 DIAGNOSIS — I484 Atypical atrial flutter: Secondary | ICD-10-CM | POA: Diagnosis not present

## 2017-09-08 DIAGNOSIS — I10 Essential (primary) hypertension: Secondary | ICD-10-CM

## 2017-09-08 NOTE — Patient Instructions (Signed)
Medication Instructions:   NO CHANGE  Testing/Procedures:  Your physician has requested that you have a cardiac MRI. Cardiac MRI uses a computer to create images of your heart as its beating, producing both still and moving pictures of your heart and major blood vessels. For further information please visit [email or web address] Please follow the instruction sheet given to you today for more information.    Follow-Up:  Your physician wants you to follow-up in: [place] will receive a reminder letter in the mail two months in advance. If you don't receive a letter, please call our office to schedule the follow-up appointment.   If you need a refill on your cardiac medications before your next appointment, please call your pharmacy.

## 2017-09-16 ENCOUNTER — Encounter: Payer: Self-pay | Admitting: Cardiology

## 2017-09-22 ENCOUNTER — Ambulatory Visit: Payer: BLUE CROSS/BLUE SHIELD | Admitting: Pulmonary Disease

## 2017-09-24 ENCOUNTER — Ambulatory Visit (HOSPITAL_COMMUNITY)
Admission: RE | Admit: 2017-09-24 | Discharge: 2017-09-24 | Disposition: A | Discharge status: Home / Self Care | Payer: BLUE CROSS/BLUE SHIELD | Source: Ambulatory Visit | Attending: Cardiology | Admitting: Cardiology

## 2017-09-24 DIAGNOSIS — D869 Sarcoidosis, unspecified: Secondary | ICD-10-CM | POA: Diagnosis not present

## 2017-09-24 DIAGNOSIS — I313 Pericardial effusion (noninflammatory): Secondary | ICD-10-CM | POA: Insufficient documentation

## 2017-09-24 DIAGNOSIS — I081 Rheumatic disorders of both mitral and tricuspid valves: Secondary | ICD-10-CM | POA: Insufficient documentation

## 2017-09-24 MED ORDER — GADOBUTROL 1 MMOL/ML IV SOLN
8.0000 mL | Freq: Once | INTRAVENOUS | Status: AC | PRN
  Administered 2017-09-24: 8 mL via INTRAVENOUS

## 2017-09-25 ENCOUNTER — Ambulatory Visit: Payer: BLUE CROSS/BLUE SHIELD | Admitting: Internal Medicine

## 2017-09-26 ENCOUNTER — Telehealth: Payer: Self-pay | Admitting: Cardiology

## 2017-09-26 NOTE — Telephone Encounter (Signed)
Appt scheduled next available in november

## 2017-09-26 NOTE — Telephone Encounter (Signed)
New message:       Pt states she had a my chart message that said they wanted her to get in with Dr. Stanford Breed due to a MRI she had done. Dr's first available is 11/15

## 2017-09-30 ENCOUNTER — Other Ambulatory Visit: Payer: Self-pay | Admitting: Internal Medicine

## 2017-10-01 ENCOUNTER — Other Ambulatory Visit: Payer: Self-pay | Admitting: Pulmonary Disease

## 2017-10-01 ENCOUNTER — Encounter: Payer: Self-pay | Admitting: Pulmonary Disease

## 2017-10-01 ENCOUNTER — Ambulatory Visit: Payer: BLUE CROSS/BLUE SHIELD | Admitting: Pulmonary Disease

## 2017-10-01 VITALS — BP 128/70 | HR 96 | Ht 59.75 in | Wt 121.4 lb

## 2017-10-01 DIAGNOSIS — D869 Sarcoidosis, unspecified: Secondary | ICD-10-CM

## 2017-10-01 DIAGNOSIS — I272 Pulmonary hypertension, unspecified: Secondary | ICD-10-CM

## 2017-10-01 DIAGNOSIS — R160 Hepatomegaly, not elsewhere classified: Secondary | ICD-10-CM | POA: Diagnosis not present

## 2017-10-01 DIAGNOSIS — D860 Sarcoidosis of lung: Secondary | ICD-10-CM

## 2017-10-01 NOTE — Patient Instructions (Signed)
Hepatic sarcoidosis: Repeat MRI of liver  Pulmonary sarcoidosis: Lung function testing in November at [place] with Dr. Dorothyann Peng in November at [facility] Stay off of prednisone Consider the "flu cell" flu shot which is designed for patients who have an egg allergy Practice good hand hygiene Stay active  Follow-up in 6 months with me or sooner if needed

## 2017-10-01 NOTE — Progress Notes (Signed)
Subjective:    Patient ID: [name], female    DOB: [birth year], 57 y.o.   MRN: [id number]  Synopsis: Former patient of Dr. Joya Gaskins who has sarcoidosis. She also has asthma. She was diagnosed with a skin biopsy in 1983 when she had a rash.  The disease had been relatively quiet for years but in 2018 she developed a large liver mass which was noted to be full of non-caseating granulomas on biopsy.  Her symptoms at that time were weight loss, increased gas and frequent stooling.  A HRCT performed in 2018 showed worsening consolidative changes in her lungs bilaterally consistent with sarcoidosis.  She started taking prednisone on July 13 2016.  She stopped this medicine around late spring 2019.  HPI  Chief Complaint  Patient presents with  . Follow-up    pt states breathing is doing well. pt reports of occ sob with extreme exertion & occ non prod cough.    [name] has been doing well since the last visit.  She had a cardiac MRI which showed no evidence of cardiac sarcoidosis.  She has remained off prednisone and has not had new respiratory complaints.  Specifically she says that she exercises quite frequently and she tolerates it well.  She has not had problems with cough or mucus production.  She says that her GI symptoms have been stable.  She has not had abdominal pain, bloating, or change in bowel habits.  Past Medical History:  Diagnosis Date  . AAA (abdominal aortic aneurysm) (HCC)    a. 4.2 cm by CT in 06/2016  . Allergy   . Anxiety   . Asthma   . Atrial flutter (HCC)    a. on Flecainide and Eliquis  . Cataract   . Depression    pt states no depression 07-11-15  . Glaucoma   . Heart murmur    as child only  . Herpes simplex   . Hypertension   . Multiple thyroid nodules   . Osteoarthritis   . Osteoporosis   . PUD (peptic ulcer disease)   . Pulmonary sarcoidosis ([place])   . Tubular adenoma of colon 07/2015  . Vitamin D deficiency      Review of Systems    Constitutional: Negative for chills, fatigue and fever.  HENT: Negative for nosebleeds, postnasal drip, rhinorrhea, sinus pressure and sneezing.   Respiratory: Negative for cough, shortness of breath and wheezing.   Cardiovascular: Negative for chest pain, palpitations and leg swelling.       Objective:   Physical Exam Vitals:   10/01/17 1146  BP: 128/70  Pulse: 96  SpO2: 97%  Weight: 121 lb 6.4 oz (55.1 kg)  Height: 4' 11.75" (1.518 m)   RA  Gen: well appearing HENT: OP clear, TM's clear, neck supple PULM: CTA B, normal percussion CV: RRR, no mgr, trace edema GI: BS+, soft, nontender Derm: no cyanosis or rash Psyche: normal mood and affect   Imaging: CXR images from 2016 reviewed: There is significant upper lobe scarring which radiology feels has progressed compared to the prior study 06/2016 HRCT> significant interval worsening of pulmonary parenchymal abnormality, mediastinal lymphadenopathy worse; also with ectactic aorta, suggestion of pulmonary hypertension 07/2016 Cardiac MRI: no evidence of sarcoidosis, mild LAE and RAE, LVEF 58% 12/2016 HRCT> per radiology some increase in bilateral upper lobe predominant fibrosis, images independently reviewed, its not clear to me that this has progressed as some of the patchy airspace disease has decreased, however there is a new  R pleural effusion September 24, 2017 cardiac MRI LVEF 55 to 60%, no late gadolinium enhancement or evidence of cardiac sarcoidosis, normal RV size and function, severe left atrial dilation and moderate right atrial dilation, mild to moderate mitral regurgitation and tricuspid regurgitation.  Dilated pulmonary artery measuring 34 mmHg.  Mild pericardial effusion, no tamponade.  PFT: June 2014 pulmonary function testing ratio 77%, FEV1 1.99 L, 104% predicted), FVC 2.60 L, 108% predicted, total lung capacity 3.75 L (84% predicted), DLCO 20.0 (106% predicted) July 2018 pulmonary function testing from [facility] ratio  55%, FVC 2.17 L 82% predicted, FEV1 1.27 L, total lung capacity 4.4 L 85% protected, DLCO 65% predicted November 2018 sp[facility]rometry from [facility] Ratio 55% FVC 1.74 L May 2019 [facility] PFT: FVC 2.18L (83% pred) DLCO 18.1 L (65%)  Pathology: May 2018 liver biopsy showed noncaseating granulomas, special stains were negative for organisms.  Labs: August 22, 2017 chemistry reviewed, creatinine was 1.27 and calcium was 10.4 which is near her baseline     Assessment & Plan:  Liver mass  Sarcoidosis - Plan: MR LIVER W CONTRAST  Pulmonary sarcoidosis ([place])  Pulmonary hypertension, low resistance (HCC)  Discussion: This has been a stable interval for [name].  She has not had signs or symptoms of progressive pulmonary disease.  She has remained off of prednisone for several months at this point.  She has not had abdominal complaints consistent with her prior hypercalcemia or hepatic sarcoidosis.  Plan: Hepatic sarcoidosis: Repeat MRI of liver  Pulmonary sarcoidosis: Lung function testing in November at [place] with Dr. Dorothyann Peng in November at [facility] Stay off of prednisone Consider the "flu cell" flu shot which is designed for patients who have an egg allergy Practice good hand hygiene Stay active  Follow-up in 6 months with me or sooner if needed   Current Outpatient Medications:  .  acyclovir (ZOVIRAX) 200 MG capsule, Take 1-2 capsules (200-400 mg total) by mouth daily. (Patient taking differently: Take 200 mg by mouth 2 (two) times daily. ), Disp: 30 capsule, Rfl: 11 .  amLODipine (NORVASC) 5 MG tablet, Take 1 tablet (5 mg total) by mouth daily. (Patient taking differently: Take 2.5 mg by mouth daily. ), Disp: 30 tablet, Rfl: 11 .  apixaban (ELIQUIS) 5 MG TABS tablet, Take 1 tablet (5 mg total) by mouth every 12 (twelve) hours. (Patient taking differently: Take 2.5 mg by mouth every 12 (twelve) hours. ), Disp: 60 tablet, Rfl: 5 .  Coenzyme Q10 (CO Q 10 PO), Take 1 each by mouth daily.,  Disp: , Rfl:  .  digoxin (LANOXIN) 0.125 MG tablet, TAKE 1 TABLET (0.125 MG TOTAL) BY MOUTH DAILY., Disp: 90 tablet, Rfl: 3 .  Dorzolamide HCl-Timolol Mal (COSOPT OP), Apply 1 drop to eye as needed., Disp: , Rfl:  .  hydroxypropyl methylcellulose / hypromellose (ISOPTO TEARS / GONIOVISC) 2.5 % ophthalmic solution, Place 1 drop into the right eye daily., Disp: , Rfl:  .  KLOR-CON/EF 25 MEQ disintegrating tablet, TAKE 1 TABLET AND ALLOW TO DISSOLVE IN 4 OZ OF COLD WATER BEFORE DRINKING 2 TIMES DAILY, Disp: 60 tablet, Rfl: 5 .  loratadine (CLARITIN) 10 MG tablet, Take 10 mg by mouth daily., Disp: , Rfl:  .  losartan (COZAAR) 50 MG tablet, Take 1 tablet (50 mg total) by mouth daily. (Patient taking differently: Take 25 mg by mouth daily. ), Disp: 30 tablet, Rfl: 11 .  Multiple Vitamins-Minerals (HAIR SKIN & NAILS ADVANCED PO), Take 2 capsules by mouth daily., Disp: ,  Rfl:  .  vitamin B-12 (CYANOCOBALAMIN) 100 MCG tablet, Take 100 mcg by mouth daily. Reported on 06/20/2015, Disp: , Rfl:

## 2017-10-13 ENCOUNTER — Ambulatory Visit (HOSPITAL_COMMUNITY)
Admission: RE | Admit: 2017-10-13 | Discharge: 2017-10-13 | Disposition: A | Discharge status: Home / Self Care | Payer: BLUE CROSS/BLUE SHIELD | Source: Ambulatory Visit | Attending: Pulmonary Disease | Admitting: Pulmonary Disease

## 2017-10-13 DIAGNOSIS — R932 Abnormal findings on diagnostic imaging of liver and biliary tract: Secondary | ICD-10-CM | POA: Insufficient documentation

## 2017-10-13 DIAGNOSIS — D869 Sarcoidosis, unspecified: Secondary | ICD-10-CM | POA: Diagnosis present

## 2017-10-13 DIAGNOSIS — I517 Cardiomegaly: Secondary | ICD-10-CM | POA: Insufficient documentation

## 2017-10-13 DIAGNOSIS — R160 Hepatomegaly, not elsewhere classified: Secondary | ICD-10-CM | POA: Diagnosis not present

## 2017-10-13 DIAGNOSIS — N2889 Other specified disorders of kidney and ureter: Secondary | ICD-10-CM | POA: Insufficient documentation

## 2017-10-13 MED ORDER — GADOBUTROL 1 MMOL/ML IV SOLN
6.0000 mL | Freq: Once | INTRAVENOUS | Status: AC | PRN
  Administered 2017-10-13: 6 mL via INTRAVENOUS

## 2017-10-14 ENCOUNTER — Telehealth: Payer: Self-pay | Admitting: Pulmonary Disease

## 2017-10-14 ENCOUNTER — Other Ambulatory Visit: Payer: Self-pay | Admitting: Internal Medicine

## 2017-10-14 ENCOUNTER — Encounter: Payer: Self-pay | Admitting: *Deleted

## 2017-10-14 NOTE — Telephone Encounter (Signed)
I called [name] today to review the MRI of her liver which showed that the mass had not changed in size.  There is also concern for early cirrhotic changes.  I explained to her that I think she should follow-up with the team at [facility] regarding this once again.  Triage: Please forward the results of the MRI of her liver from this week to Dr. Teena Irani in the [facility] gastroenterology department.  Please attach the following letter:  Dear Dr. Edison Pace,  I have cared for [name] date of birth 12-13-1960 since 2018.  As you will remember she had a known diagnosis of sarcoidosis for several decades but in the midst of some weight loss and abdominal pain was found to have a liver lesion in 2018.  This was biopsied and showed granulomatous inflammation felt to be consistent with her sarcoidosis.  Around this time lung imaging showed significant progression of pulmonary parenchymal disease and a decline in lung function testing.    Dr. Chana Bode and I both cared for her and treated her with prednisone at relative modest dosing around 20 mg a day starting July 13, 2016.  Though lung imaging in December 2018 at our center was initially interpreted as concern for progression of fibrosis by the radiologist, my personal review felt that there was actually some improvement in pulmonary parenchymal disease.  Eventually lung function testing improved and her FVC returned to baseline.  Per her request we stopped prednisone in February 2019.  I have been monitoring her closely and she has not had return of respiratory symptoms and she plans to have another lung function test in November this year at Dr. Sherry Ruffing clinic.  An MRI of her liver was performed this month which shows stability in size of the liver lesion but there is concern for cirrhotic change.  Most recent liver function testing shows a slightly elevated alkaline phosphatase but the transaminases are within normal limits.  I have asked  her to see you again in clinic.  At this point it is not clear to me if we could prevent some sort of progression of liver disease by reinstating immunosuppression.  Please consider addressing this when you see her in clinic again.  I have asked her to have an appointment with you.  Sincerely,  Betty Awkward, MD [facility] Pager: [phone] Cell: [phone] After 3pm or if no response, call [phone]

## 2017-10-14 NOTE — Telephone Encounter (Signed)
MRI has been sent to Dr. Teena Irani. Letter has been drafted and sent to Dr. Teena Irani.

## 2017-10-16 NOTE — Telephone Encounter (Signed)
Dr. Lake Bells,     Patient is requesting the the Liver MRI be posted on MyChart so that she can view it herself. Please advise!  Thanks,         Burman Nieves

## 2017-10-17 ENCOUNTER — Telehealth: Payer: Self-pay | Admitting: Pulmonary Disease

## 2017-10-17 NOTE — Telephone Encounter (Signed)
Her appt is this morning at [facility] at 8:40 am.

## 2017-10-17 NOTE — Telephone Encounter (Signed)
We received a message from [facility] yesterday, 10/16/17 at 5pm stating that a letter was sent to [facility] with the needed information for them.

## 2017-11-22 ENCOUNTER — Other Ambulatory Visit: Payer: Self-pay | Admitting: Internal Medicine

## 2017-11-26 ENCOUNTER — Encounter: Payer: Self-pay | Admitting: Internal Medicine

## 2017-11-26 ENCOUNTER — Other Ambulatory Visit (INDEPENDENT_AMBULATORY_CARE_PROVIDER_SITE_OTHER): Payer: BLUE CROSS/BLUE SHIELD

## 2017-11-26 ENCOUNTER — Ambulatory Visit (INDEPENDENT_AMBULATORY_CARE_PROVIDER_SITE_OTHER): Payer: BLUE CROSS/BLUE SHIELD | Admitting: Internal Medicine

## 2017-11-26 DIAGNOSIS — I714 Abdominal aortic aneurysm, without rupture, unspecified: Secondary | ICD-10-CM

## 2017-11-26 DIAGNOSIS — I4891 Unspecified atrial fibrillation: Secondary | ICD-10-CM | POA: Diagnosis not present

## 2017-11-26 DIAGNOSIS — I10 Essential (primary) hypertension: Secondary | ICD-10-CM | POA: Diagnosis not present

## 2017-11-26 DIAGNOSIS — F3341 Major depressive disorder, recurrent, in partial remission: Secondary | ICD-10-CM

## 2017-11-26 LAB — BASIC METABOLIC PANEL
BUN: 21 mg/dL (ref 6–23)
CHLORIDE: 98 meq/L (ref 96–112)
CO2: 31 meq/L (ref 19–32)
Calcium: 10.1 mg/dL (ref 8.4–10.5)
Creatinine, Ser: 1.17 mg/dL (ref 0.40–1.20)
GFR: 61.03 mL/min (ref >60.00)
Glucose, Bld: 88 mg/dL (ref 70–99)
POTASSIUM: 3.6 meq/L (ref 3.5–5.1)
SODIUM: 137 meq/L (ref 135–145)

## 2017-11-26 LAB — HEPATIC FUNCTION PANEL
ALK PHOS: 133 U/L — AB (ref 39–117)
ALT: 25 U/L (ref 0–35)
AST: 27 U/L (ref 0–37)
Albumin: 4.2 g/dL (ref 3.5–5.2)
Bilirubin, Direct: 0.3 mg/dL (ref 0.0–0.3)
Total Bilirubin: 1.3 mg/dL — ABNORMAL HIGH (ref 0.2–1.2)
Total Protein: 8 g/dL (ref 6.0–8.3)

## 2017-11-26 LAB — TSH: TSH: 2.87 u[IU]/mL (ref 0.35–4.50)

## 2017-11-26 NOTE — Assessment & Plan Note (Signed)
BP control 

## 2017-11-26 NOTE — Progress Notes (Signed)
Subjective:  Patient ID: [name], female    DOB: 03/31/1960  Age: 57 y.o. MRN: [id number]  CC: No chief complaint on file.   HPI [name] presents for sarcoid, HTN, A fib f/u  Outpatient Medications Prior to Visit  Medication Sig Dispense Refill  . acyclovir (ZOVIRAX) 200 MG capsule TAKE 1-2 CAPSULES (200-400 MG TOTAL) BY MOUTH DAILY. 60 capsule 5  . amLODipine (NORVASC) 5 MG tablet Take 1 tablet (5 mg total) by mouth daily. (Patient taking differently: Take 2.5 mg by mouth daily. ) 30 tablet 11  . apixaban (ELIQUIS) 5 MG TABS tablet Take 1 tablet (5 mg total) by mouth every 12 (twelve) hours. (Patient taking differently: Take 2.5 mg by mouth every 12 (twelve) hours. ) 60 tablet 5  . Coenzyme Q10 (CO Q 10 PO) Take 1 each by mouth daily.    . digoxin (LANOXIN) 0.125 MG tablet TAKE 1 TABLET (0.125 MG TOTAL) BY MOUTH DAILY. 90 tablet 3  . Dorzolamide HCl-Timolol Mal (COSOPT OP) Apply 1 drop to eye as needed.    . hydroxypropyl methylcellulose / hypromellose (ISOPTO TEARS / GONIOVISC) 2.5 % ophthalmic solution Place 1 drop into the right eye daily.    [name] KLOR-CON/EF 25 MEQ disintegrating tablet TAKE 1 TABLET AND ALLOW TO DISSOLVE IN 4 OZ OF COLD WATER BEFORE DRINKING 2 TIMES DAILY 60 tablet 5  . loratadine (CLARITIN) 10 MG tablet Take 10 mg by mouth daily.    [name] losartan (COZAAR) 50 MG tablet Take 1 tablet (50 mg total) by mouth daily. (Patient taking differently: Take 25 mg by mouth daily. ) 30 tablet 11  . Multiple Vitamins-Minerals (HAIR SKIN & NAILS ADVANCED PO) Take 2 capsules by mouth daily.    . predniSONE (DELTASONE) 5 MG tablet TAKE 1 TABLET BY MOUTH EVERY DAY 30 tablet 11  . vitamin B-12 (CYANOCOBALAMIN) 100 MCG tablet Take 100 mcg by mouth daily. Reported on 06/20/2015     No facility-administered medications prior to visit.     ROS: Review of Systems  Constitutional: Negative for activity change, appetite change, chills, fatigue and unexpected weight change.  HENT:  Negative for congestion, mouth sores and sinus pressure.   Eyes: Negative for visual disturbance.  Respiratory: Positive for cough. Negative for chest tightness.   Gastrointestinal: Negative for abdominal pain and nausea.  Genitourinary: Negative for difficulty urinating, frequency and vaginal pain.  Musculoskeletal: Negative for back pain and gait problem.  Skin: Negative for pallor and rash.  Neurological: Negative for dizziness, tremors, weakness, numbness and headaches.  Psychiatric/Behavioral: Negative for confusion and sleep disturbance.    Objective:  BP 128/76 (BP Location: Left Arm, Patient Position: Sitting, Cuff Size: Normal)   Pulse 91   Temp 97.9 F (36.6 C) (Oral)   Ht 4' 11.75" (1.518 m)   Wt 123 lb (55.8 kg)   SpO2 98%   BMI 24.22 kg/m   BP Readings from Last 3 Encounters:  11/26/17 128/76  10/01/17 128/70  09/08/17 (!) 126/94    Wt Readings from Last 3 Encounters:  11/26/17 123 lb (55.8 kg)  10/01/17 121 lb 6.4 oz (55.1 kg)  09/08/17 125 lb (56.7 kg)    Physical Exam  Constitutional: She appears well-developed. No distress.  HENT:  Head: Normocephalic.  Right Ear: External ear normal.  Left Ear: External ear normal.  Nose: Nose normal.  Mouth/Throat: Oropharynx is clear and moist.  Eyes: Pupils are equal, round, and reactive to light. Conjunctivae are normal. Right eye  exhibits no discharge. Left eye exhibits no discharge.  Neck: Normal range of motion. Neck supple. No JVD present. No tracheal deviation present. No thyromegaly present.  Cardiovascular: Normal rate, regular rhythm and normal heart sounds.  Pulmonary/Chest: No stridor. No respiratory distress. She has no wheezes.  Abdominal: Soft. Bowel sounds are normal. She exhibits no distension and no mass. There is no tenderness. There is no rebound and no guarding.  Musculoskeletal: She exhibits no edema or tenderness.  Lymphadenopathy:    She has no cervical adenopathy.  Neurological: She  displays normal reflexes. No cranial nerve deficit. She exhibits normal muscle tone. Coordination normal.  Skin: No rash noted. No erythema.  Psychiatric: She has a normal mood and affect. Her behavior is normal. Judgment and thought content normal.  same wt  Lab Results  Component Value Date   WBC 4.9 08/22/2017   HGB 14.5 08/22/2017   HCT 42.4 08/22/2017   PLT 283.0 08/22/2017   GLUCOSE 118 (H) 08/22/2017   CHOL 150 01/25/2013   TRIG 55.0 01/25/2013   HDL 38.90 (L) 01/25/2013   LDLCALC 100 (H) 01/25/2013   ALT 17 08/22/2017   AST 25 08/22/2017   NA 138 08/22/2017   K 3.4 (L) 08/22/2017   CL 99 08/22/2017   CREATININE 1.27 (H) 08/22/2017   BUN 22 08/22/2017   CO2 30 08/22/2017   TSH 2.30 08/22/2017   INR 1.9 (H) 08/22/2017   HGBA1C 5.9 05/18/2013   MICROALBUR 5.6 (H) 08/22/2017    Mr Liver W Wo Contrast  Result Date: 10/13/2017 CLINICAL DATA:  Thoracic and intra-abdominal sarcoidosis. Liver mass in 2018 was biopsied revealing noncaseating granuloma. Occasional periumbilical pain. EXAM: MRI ABDOMEN WITHOUT AND WITH CONTRAST TECHNIQUE: Multiplanar multisequence MR imaging of the abdomen was performed both before and after the administration of intravenous contrast. CONTRAST:  6 cc Gadavist COMPARISON:  Multiple exams, including 05/13/2016 MRI FINDINGS: Lower chest: Mild cardiomegaly. Hepatobiliary: Geographic high T2 and low T1 signal intensity in the right hepatic lobe. Bridging of involvement approximately 7.2 by 6.6 by 7.4 cm today and previously by my measurements 8.6 by 7.2 by approximately 8.2 cm. Vessels appear to pass through this lesion compatible with an infiltrative process. No new or separate lesion in the liver is observed. There is prominence the lateral segment left hepatic lobe and caudate lobe, with relative atrophy of the right hepatic lobe. Gallbladder unremarkable. The region of geographic infiltration enhances relatively similar to the liver on all phases today. On  the prior exam we used Eovist and the lesion was more conspicuous on the 20 minutes images. Pancreas:  Unremarkable Spleen:  Unremarkable Adrenals/Urinary Tract: There are 3 right and 6 left T1 signal hyperintensities in the kidneys. A 0.8 by 0.6 cm high T1 signal in the left mid kidney also has low T2 signal. Some of these complex lesions are new compared to the prior exam, although there was also a 2.1 cm complex lesion of the right mid kidney previously which has resolved. There is clear misregistration on the subtraction images making it difficult to be certain that none of these lesions are enhancing. Because of the small size of most of the lesions, direct signal intensity measurements are subject to volume averaging and accordingly are not reliable. Overall I expect that these are complex but benign cysts. All are below 1 cm in diameter and many are below 5 mm in diameter. Stomach/Bowel: Unremarkable Vascular/Lymphatic:  Unremarkable Other:  No supplemental non-categorized findings. Musculoskeletal: Unremarkable IMPRESSION: 1. Roughly similar  appearance of the geographic infiltrative region of high T2 and low T1 signal intensity in the right hepatic lobe. Previous biopsy revealed granulomatous tissue in the patient has known sarcoidosis. 2. Chronic prominence of the lateral segment left hepatic lobe and caudate lobe. This can be an early morphologic signs of cirrhosis. 3. There are approximately 9 small complex lesions scattered in the kidneys. These favor complex cysts although for the most part are too small to characterize. Subtraction imaging is not highly helpful in this case due to misregistration in the very small size of many of these lesions. If the patient has hematuria or if otherwise clinically warranted, surveillance imaging might be considered. Some of these lesions are new compared to 05/13/2016 although others are stable or have resolved from that exam. 4. Mild cardiomegaly. Electronically  Signed   By: Van Clines M.D.   On: 10/13/2017 10:08    Assessment & Plan:   There are no diagnoses linked to this encounter.   No orders of the defined types were placed in this encounter.    Follow-up: No follow-ups on file.  Walker Kehr, MD

## 2017-11-26 NOTE — Assessment & Plan Note (Signed)
Eliquis, Flecainide Digoxin helped

## 2017-11-26 NOTE — Assessment & Plan Note (Signed)
Doing fair 

## 2017-11-26 NOTE — Assessment & Plan Note (Signed)
BP Readings from Last 3 Encounters:  11/26/17 128/76  10/01/17 128/70  09/08/17 (!) 126/94

## 2017-11-28 ENCOUNTER — Ambulatory Visit: Payer: BLUE CROSS/BLUE SHIELD | Admitting: Cardiology

## 2017-12-04 ENCOUNTER — Other Ambulatory Visit: Payer: Self-pay | Admitting: Internal Medicine

## 2017-12-14 ENCOUNTER — Other Ambulatory Visit: Payer: Self-pay | Admitting: Internal Medicine

## 2017-12-17 ENCOUNTER — Ambulatory Visit: Payer: Self-pay

## 2017-12-17 NOTE — Telephone Encounter (Signed)
Patient called in with c/o "dizziness." She says "it's very mild dizziness and I'm not dizzy now. It started about a week ago, off and on. The room doesn't spin, I'm just lightheaded a little. It happened about 1530 when I was eating dinner. I have been exercising and my BP medication may need to be adjusted." I asked her to check her BP, she says "it is 162/100, P 86." I asked about other symptoms, she denies. According to protocol, see PCP within 3 days, no availability with PCP. I asked the patient if she would like to be scheduled with another provider, she says "no, I only want to see Dr. Alain Marion." I advised I will send this over to the office for Dr. Alain Marion to review and someone will call with his recommendation. She verbalized understanding.   Reason for Disposition . [1] MILD dizziness (e.g., walking normally) AND [2] has NOT been evaluated by physician for this  (Exception: dizziness caused by heat exposure, sudden standing, or poor fluid intake)  Answer Assessment - Initial Assessment Questions 1. DESCRIPTION: "Describe your dizziness."     Lightheaded, dizzy 2. LIGHTHEADED: "Do you feel lightheaded?" (e.g., somewhat faint, woozy, weak upon standing)     Yes  3. VERTIGO: "Do you feel like either you or the room is spinning or tilting?" (i.e. vertigo)     No 4. SEVERITY: "How bad is it?"  "Do you feel like you are going to faint?" "Can you stand and walk?"   - MILD - walking normally   - MODERATE - interferes with normal activities (e.g., work, school)    - SEVERE - unable to stand, requires support to walk, feels like passing out now.      Mild 5. ONSET:  "When did the dizziness begin?"     Noticed it last week, but off and on. Happened before eating around 1530 6. AGGRAVATING FACTORS: "Does anything make it worse?" (e.g., standing, change in head position)     Standing and change in head position 7. HEART RATE: "Can you tell me your heart rate?" "How many beats in 15 seconds?"   (Note: not all patients can do this)       86 8. CAUSE: "What do you think is causing the dizziness?"     I might need my BP medication change or adjusted 9. RECURRENT SYMPTOM: "Have you had dizziness before?" If so, ask: "When was the last time?" "What happened that time?"     No 10. OTHER SYMPTOMS: "Do you have any other symptoms?" (e.g., fever, chest pain, vomiting, diarrhea, bleeding)       No 11. PREGNANCY: "Is there any chance you are pregnant?" "When was your last menstrual period?"       No  Protocols used: DIZZINESS [facility]

## 2017-12-18 NOTE — Telephone Encounter (Signed)
Appointment scheduled for Monday. 

## 2017-12-18 NOTE — Telephone Encounter (Signed)
We do not have any appointments available the rest of the week, pt will have to see another provider in the office or wait till next week.

## 2017-12-22 ENCOUNTER — Other Ambulatory Visit (INDEPENDENT_AMBULATORY_CARE_PROVIDER_SITE_OTHER): Payer: BLUE CROSS/BLUE SHIELD

## 2017-12-22 ENCOUNTER — Ambulatory Visit (INDEPENDENT_AMBULATORY_CARE_PROVIDER_SITE_OTHER): Payer: BLUE CROSS/BLUE SHIELD | Admitting: Internal Medicine

## 2017-12-22 ENCOUNTER — Encounter: Payer: Self-pay | Admitting: Internal Medicine

## 2017-12-22 DIAGNOSIS — J301 Allergic rhinitis due to pollen: Secondary | ICD-10-CM

## 2017-12-22 DIAGNOSIS — R739 Hyperglycemia, unspecified: Secondary | ICD-10-CM

## 2017-12-22 DIAGNOSIS — J4540 Moderate persistent asthma, uncomplicated: Secondary | ICD-10-CM | POA: Diagnosis not present

## 2017-12-22 DIAGNOSIS — I4891 Unspecified atrial fibrillation: Secondary | ICD-10-CM | POA: Diagnosis not present

## 2017-12-22 DIAGNOSIS — I10 Essential (primary) hypertension: Secondary | ICD-10-CM

## 2017-12-22 LAB — HEPATIC FUNCTION PANEL
ALK PHOS: 122 U/L — AB (ref 39–117)
ALT: 23 U/L (ref 0–35)
AST: 27 U/L (ref 0–37)
Albumin: 4.2 g/dL (ref 3.5–5.2)
BILIRUBIN DIRECT: 0.2 mg/dL (ref 0.0–0.3)
BILIRUBIN TOTAL: 1.2 mg/dL (ref 0.2–1.2)
Total Protein: 8.2 g/dL (ref 6.0–8.3)

## 2017-12-22 LAB — BASIC METABOLIC PANEL
BUN: 24 mg/dL — ABNORMAL HIGH (ref 6–23)
CHLORIDE: 100 meq/L (ref 96–112)
CO2: 26 mEq/L (ref 19–32)
Calcium: 10.6 mg/dL — ABNORMAL HIGH (ref 8.4–10.5)
Creatinine, Ser: 1.39 mg/dL — ABNORMAL HIGH (ref 0.40–1.20)
GFR: 50.01 mL/min — AB (ref >60.00)
Glucose, Bld: 97 mg/dL (ref 70–99)
POTASSIUM: 3.5 meq/L (ref 3.5–5.1)
SODIUM: 137 meq/L (ref 135–145)

## 2017-12-22 MED ORDER — FLUCONAZOLE 150 MG PO TABS: 150.0000 mg | ORAL_TABLET | Freq: Once | ORAL | 1 refills | Status: AC

## 2017-12-22 NOTE — Assessment & Plan Note (Signed)
Flonase, Loratidine 

## 2017-12-22 NOTE — Patient Instructions (Signed)
Wt Readings from Last 3 Encounters:  12/22/17 121 lb (54.9 kg)  11/26/17 123 lb (55.8 kg)  10/01/17 121 lb 6.4 oz (55.1 kg)    BP Readings from Last 3 Encounters:  12/22/17 136/84  11/26/17 128/76  10/01/17 128/70

## 2017-12-22 NOTE — Assessment & Plan Note (Signed)
Labs

## 2017-12-22 NOTE — Assessment & Plan Note (Signed)
Losartan 

## 2017-12-22 NOTE — Assessment & Plan Note (Signed)
Off Singulair

## 2017-12-22 NOTE — Progress Notes (Signed)
Subjective:  Patient ID: [name], female    DOB: 03-16-1960  Age: 57 y.o. MRN: [id number]  CC: No chief complaint on file.   HPI [name] presents for dizziness, A fib, HTN The pt had SBP 160 and felt dizzy, lightheaded - resolved [name] will have to find another PCP due to insurance change  Outpatient Medications Prior to Visit  Medication Sig Dispense Refill  . acyclovir (ZOVIRAX) 200 MG capsule TAKE 1-2 CAPSULES (200-400 MG TOTAL) BY MOUTH DAILY. 60 capsule 5  . amLODipine (NORVASC) 5 MG tablet Take 1 tablet (5 mg total) by mouth daily. (Patient taking differently: Take 2.5 mg by mouth daily. ) 30 tablet 11  . apixaban (ELIQUIS) 5 MG TABS tablet Take 1 tablet (5 mg total) by mouth every 12 (twelve) hours. (Patient taking differently: Take 2.5 mg by mouth every 12 (twelve) hours. ) 60 tablet 5  . digoxin (LANOXIN) 0.125 MG tablet TAKE 1 TABLET (0.125 MG TOTAL) BY MOUTH DAILY. 90 tablet 3  . Dorzolamide HCl-Timolol Mal (COSOPT OP) Apply 1 drop to eye as needed.    . hydroxypropyl methylcellulose / hypromellose (ISOPTO TEARS / GONIOVISC) 2.5 % ophthalmic solution Place 1 drop into the right eye daily.    [name] KLOR-CON/EF 25 MEQ disintegrating tablet TAKE 1 TABLET AND ALLOW TO DISSOLVE IN 4 OZ OF COLD WATER BEFORE DRINKING 2 TIMES DAILY 60 tablet 5  . loratadine (CLARITIN) 10 MG tablet Take 10 mg by mouth daily.    [name] losartan (COZAAR) 50 MG tablet Take 1 tablet (50 mg total) by mouth daily. (Patient taking differently: Take 25 mg by mouth daily. ) 30 tablet 11  . Multiple Vitamins-Minerals (HAIR SKIN & NAILS ADVANCED PO) Take 2 capsules by mouth daily.    . vitamin B-12 (CYANOCOBALAMIN) 100 MCG tablet Take 100 mcg by mouth daily. Reported on 06/20/2015    . Coenzyme Q10 (CO Q 10 PO) Take 1 each by mouth daily.    . predniSONE (DELTASONE) 5 MG tablet TAKE 1 TABLET BY MOUTH EVERY DAY 30 tablet 11   No facility-administered medications prior to visit.     ROS: Review of Systems   Constitutional: Negative for activity change, appetite change, chills, fatigue and unexpected weight change.  HENT: Positive for congestion and rhinorrhea. Negative for mouth sores and sinus pressure.   Eyes: Negative for visual disturbance.  Respiratory: Positive for chest tightness. Negative for cough.   Gastrointestinal: Negative for abdominal pain and nausea.  Genitourinary: Negative for difficulty urinating, frequency and vaginal pain.  Musculoskeletal: Negative for back pain and gait problem.  Skin: Negative for pallor and rash.  Neurological: Positive for dizziness and weakness. Negative for tremors, numbness and headaches.  Psychiatric/Behavioral: Negative for confusion, dysphoric mood, sleep disturbance and suicidal ideas. The patient is not nervous/anxious.     Objective:  BP 136/84 (BP Location: Left Arm, Patient Position: Sitting, Cuff Size: Normal)   Pulse 87   Temp 98.3 F (36.8 C) (Oral)   Ht 4' 11.75" (1.518 m)   Wt 121 lb (54.9 kg)   SpO2 99%   BMI 23.83 kg/m   BP Readings from Last 3 Encounters:  12/22/17 136/84  11/26/17 128/76  10/01/17 128/70    Wt Readings from Last 3 Encounters:  12/22/17 121 lb (54.9 kg)  11/26/17 123 lb (55.8 kg)  10/01/17 121 lb 6.4 oz (55.1 kg)    Physical Exam  Constitutional: She appears well-developed. No distress.  HENT:  Head: Normocephalic.  Right Ear: External ear normal.  Left Ear: External ear normal.  Nose: Nose normal.  Mouth/Throat: Oropharynx is clear and moist.  Eyes: Pupils are equal, round, and reactive to light. Conjunctivae are normal. Right eye exhibits no discharge. Left eye exhibits no discharge.  Neck: Normal range of motion. Neck supple. No JVD present. No tracheal deviation present. No thyromegaly present.  Cardiovascular: Normal rate, regular rhythm and normal heart sounds.  Pulmonary/Chest: No stridor. No respiratory distress. She has no wheezes.  Abdominal: Soft. Bowel sounds are normal. She  exhibits no distension and no mass. There is no tenderness. There is no rebound and no guarding.  Musculoskeletal: She exhibits no edema or tenderness.  Lymphadenopathy:    She has no cervical adenopathy.  Neurological: She displays normal reflexes. No cranial nerve deficit. She exhibits normal muscle tone. Coordination normal.  Skin: No rash noted. No erythema.  Psychiatric: She has a normal mood and affect. Her behavior is normal. Judgment and thought content normal.    Lab Results  Component Value Date   WBC 4.9 08/22/2017   HGB 14.5 08/22/2017   HCT 42.4 08/22/2017   PLT 283.0 08/22/2017   GLUCOSE 88 11/26/2017   CHOL 150 01/25/2013   TRIG 55.0 01/25/2013   HDL 38.90 (L) 01/25/2013   LDLCALC 100 (H) 01/25/2013   ALT 25 11/26/2017   AST 27 11/26/2017   NA 137 11/26/2017   K 3.6 11/26/2017   CL 98 11/26/2017   CREATININE 1.17 11/26/2017   BUN 21 11/26/2017   CO2 31 11/26/2017   TSH 2.87 11/26/2017   INR 1.9 (H) 08/22/2017   HGBA1C 5.9 05/18/2013   MICROALBUR 5.6 (H) 08/22/2017    Mr Liver W Wo Contrast  Result Date: 10/13/2017 CLINICAL DATA:  Thoracic and intra-abdominal sarcoidosis. Liver mass in 2018 was biopsied revealing noncaseating granuloma. Occasional periumbilical pain. EXAM: MRI ABDOMEN WITHOUT AND WITH CONTRAST TECHNIQUE: Multiplanar multisequence MR imaging of the abdomen was performed both before and after the administration of intravenous contrast. CONTRAST:  6 cc Gadavist COMPARISON:  Multiple exams, including 05/13/2016 MRI FINDINGS: Lower chest: Mild cardiomegaly. Hepatobiliary: Geographic high T2 and low T1 signal intensity in the right hepatic lobe. Bridging of involvement approximately 7.2 by 6.6 by 7.4 cm today and previously by my measurements 8.6 by 7.2 by approximately 8.2 cm. Vessels appear to pass through this lesion compatible with an infiltrative process. No new or separate lesion in the liver is observed. There is prominence the lateral segment left  hepatic lobe and caudate lobe, with relative atrophy of the right hepatic lobe. Gallbladder unremarkable. The region of geographic infiltration enhances relatively similar to the liver on all phases today. On the prior exam we used Eovist and the lesion was more conspicuous on the 20 minutes images. Pancreas:  Unremarkable Spleen:  Unremarkable Adrenals/Urinary Tract: There are 3 right and 6 left T1 signal hyperintensities in the kidneys. A 0.8 by 0.6 cm high T1 signal in the left mid kidney also has low T2 signal. Some of these complex lesions are new compared to the prior exam, although there was also a 2.1 cm complex lesion of the right mid kidney previously which has resolved. There is clear misregistration on the subtraction images making it difficult to be certain that none of these lesions are enhancing. Because of the small size of most of the lesions, direct signal intensity measurements are subject to volume averaging and accordingly are not reliable. Overall I expect that these are complex but benign  cysts. All are below 1 cm in diameter and many are below 5 mm in diameter. Stomach/Bowel: Unremarkable Vascular/Lymphatic:  Unremarkable Other:  No supplemental non-categorized findings. Musculoskeletal: Unremarkable IMPRESSION: 1. Roughly similar appearance of the geographic infiltrative region of high T2 and low T1 signal intensity in the right hepatic lobe. Previous biopsy revealed granulomatous tissue in the patient has known sarcoidosis. 2. Chronic prominence of the lateral segment left hepatic lobe and caudate lobe. This can be an early morphologic signs of cirrhosis. 3. There are approximately 9 small complex lesions scattered in the kidneys. These favor complex cysts although for the most part are too small to characterize. Subtraction imaging is not highly helpful in this case due to misregistration in the very small size of many of these lesions. If the patient has hematuria or if otherwise  clinically warranted, surveillance imaging might be considered. Some of these lesions are new compared to 05/13/2016 although others are stable or have resolved from that exam. 4. Mild cardiomegaly. Electronically Signed   By: Van Clines M.D.   On: 10/13/2017 10:08    Assessment & Plan:   There are no diagnoses linked to this encounter.   No orders of the defined types were placed in this encounter.    Follow-up: No follow-ups on file.  Walker Kehr, MD

## 2017-12-22 NOTE — Assessment & Plan Note (Signed)
Dig levels Cont meds incl Eliquis

## 2017-12-23 LAB — DIGOXIN LEVEL: DIGOXIN LVL: 0.6 ug/L — AB (ref 0.8–2.0)

## 2017-12-30 ENCOUNTER — Ambulatory Visit: Payer: BLUE CROSS/BLUE SHIELD | Admitting: Pulmonary Disease

## 2018-01-02 ENCOUNTER — Other Ambulatory Visit: Payer: Self-pay | Admitting: Internal Medicine

## 2018-01-07 ENCOUNTER — Other Ambulatory Visit: Payer: Self-pay | Admitting: Internal Medicine

## 2018-01-07 MED ORDER — ACYCLOVIR 5 % EX CREA: 1.0000 "application " | TOPICAL_CREAM | CUTANEOUS | 1 refills | Status: DC

## 2018-01-07 NOTE — Progress Notes (Signed)
FYI- Thx

## 2018-01-17 ENCOUNTER — Other Ambulatory Visit: Payer: Self-pay | Admitting: Internal Medicine

## 2018-01-20 ENCOUNTER — Other Ambulatory Visit: Payer: Self-pay | Admitting: Internal Medicine

## 2018-01-29 ENCOUNTER — Ambulatory Visit: Payer: BLUE CROSS/BLUE SHIELD | Admitting: Cardiology

## 2018-02-26 ENCOUNTER — Ambulatory Visit: Payer: BLUE CROSS/BLUE SHIELD | Admitting: Internal Medicine

## 2018-06-24 ENCOUNTER — Other Ambulatory Visit: Payer: Self-pay

## 2018-06-24 ENCOUNTER — Encounter (HOSPITAL_COMMUNITY): Payer: Self-pay | Admitting: Emergency Medicine

## 2018-06-24 ENCOUNTER — Inpatient Hospital Stay (HOSPITAL_COMMUNITY): Payer: BLUE CROSS/BLUE SHIELD

## 2018-06-24 ENCOUNTER — Emergency Department (HOSPITAL_COMMUNITY): Payer: BLUE CROSS/BLUE SHIELD

## 2018-06-24 ENCOUNTER — Inpatient Hospital Stay (HOSPITAL_COMMUNITY)
Admission: EM | Admit: 2018-06-24 | Discharge: 2018-06-28 | DRG: 208 | Disposition: A | Discharge status: Home / Self Care | Payer: BLUE CROSS/BLUE SHIELD | Attending: Family Medicine | Admitting: Family Medicine

## 2018-06-24 DIAGNOSIS — D869 Sarcoidosis, unspecified: Secondary | ICD-10-CM | POA: Diagnosis not present

## 2018-06-24 DIAGNOSIS — I361 Nonrheumatic tricuspid (valve) insufficiency: Secondary | ICD-10-CM

## 2018-06-24 DIAGNOSIS — N179 Acute kidney failure, unspecified: Secondary | ICD-10-CM | POA: Diagnosis present

## 2018-06-24 DIAGNOSIS — J9602 Acute respiratory failure with hypercapnia: Secondary | ICD-10-CM

## 2018-06-24 DIAGNOSIS — I340 Nonrheumatic mitral (valve) insufficiency: Secondary | ICD-10-CM | POA: Diagnosis not present

## 2018-06-24 DIAGNOSIS — D8689 Sarcoidosis of other sites: Secondary | ICD-10-CM | POA: Diagnosis present

## 2018-06-24 DIAGNOSIS — E876 Hypokalemia: Secondary | ICD-10-CM | POA: Diagnosis present

## 2018-06-24 DIAGNOSIS — J45909 Unspecified asthma, uncomplicated: Secondary | ICD-10-CM | POA: Diagnosis present

## 2018-06-24 DIAGNOSIS — T380X5A Adverse effect of glucocorticoids and synthetic analogues, initial encounter: Secondary | ICD-10-CM | POA: Diagnosis not present

## 2018-06-24 DIAGNOSIS — F419 Anxiety disorder, unspecified: Secondary | ICD-10-CM | POA: Diagnosis present

## 2018-06-24 DIAGNOSIS — I129 Hypertensive chronic kidney disease with stage 1 through stage 4 chronic kidney disease, or unspecified chronic kidney disease: Secondary | ICD-10-CM | POA: Diagnosis present

## 2018-06-24 DIAGNOSIS — Z8249 Family history of ischemic heart disease and other diseases of the circulatory system: Secondary | ICD-10-CM

## 2018-06-24 DIAGNOSIS — J9690 Respiratory failure, unspecified, unspecified whether with hypoxia or hypercapnia: Secondary | ICD-10-CM

## 2018-06-24 DIAGNOSIS — J9600 Acute respiratory failure, unspecified whether with hypoxia or hypercapnia: Secondary | ICD-10-CM

## 2018-06-24 DIAGNOSIS — J811 Chronic pulmonary edema: Secondary | ICD-10-CM | POA: Diagnosis present

## 2018-06-24 DIAGNOSIS — I10 Essential (primary) hypertension: Secondary | ICD-10-CM | POA: Diagnosis present

## 2018-06-24 DIAGNOSIS — I4892 Unspecified atrial flutter: Secondary | ICD-10-CM | POA: Diagnosis present

## 2018-06-24 DIAGNOSIS — Z862 Personal history of diseases of the blood and blood-forming organs and certain disorders involving the immune mechanism: Secondary | ICD-10-CM

## 2018-06-24 DIAGNOSIS — Z20828 Contact with and (suspected) exposure to other viral communicable diseases: Secondary | ICD-10-CM | POA: Diagnosis present

## 2018-06-24 DIAGNOSIS — T501X5A Adverse effect of loop [high-ceiling] diuretics, initial encounter: Secondary | ICD-10-CM | POA: Diagnosis not present

## 2018-06-24 DIAGNOSIS — M810 Age-related osteoporosis without current pathological fracture: Secondary | ICD-10-CM | POA: Diagnosis present

## 2018-06-24 DIAGNOSIS — I4891 Unspecified atrial fibrillation: Secondary | ICD-10-CM | POA: Diagnosis present

## 2018-06-24 DIAGNOSIS — I714 Abdominal aortic aneurysm, without rupture: Secondary | ICD-10-CM | POA: Diagnosis present

## 2018-06-24 DIAGNOSIS — I2609 Other pulmonary embolism with acute cor pulmonale: Secondary | ICD-10-CM | POA: Diagnosis not present

## 2018-06-24 DIAGNOSIS — N183 Chronic kidney disease, stage 3 (moderate): Secondary | ICD-10-CM | POA: Diagnosis present

## 2018-06-24 DIAGNOSIS — I952 Hypotension due to drugs: Secondary | ICD-10-CM | POA: Diagnosis not present

## 2018-06-24 DIAGNOSIS — R739 Hyperglycemia, unspecified: Secondary | ICD-10-CM | POA: Diagnosis present

## 2018-06-24 DIAGNOSIS — J9601 Acute respiratory failure with hypoxia: Secondary | ICD-10-CM | POA: Diagnosis not present

## 2018-06-24 DIAGNOSIS — I272 Pulmonary hypertension, unspecified: Secondary | ICD-10-CM | POA: Diagnosis present

## 2018-06-24 DIAGNOSIS — Z452 Encounter for adjustment and management of vascular access device: Secondary | ICD-10-CM

## 2018-06-24 DIAGNOSIS — J189 Pneumonia, unspecified organism: Secondary | ICD-10-CM | POA: Diagnosis present

## 2018-06-24 DIAGNOSIS — T41295A Adverse effect of other general anesthetics, initial encounter: Secondary | ICD-10-CM | POA: Diagnosis not present

## 2018-06-24 DIAGNOSIS — I429 Cardiomyopathy, unspecified: Secondary | ICD-10-CM | POA: Diagnosis present

## 2018-06-24 DIAGNOSIS — Z823 Family history of stroke: Secondary | ICD-10-CM

## 2018-06-24 DIAGNOSIS — R4182 Altered mental status, unspecified: Secondary | ICD-10-CM | POA: Diagnosis present

## 2018-06-24 DIAGNOSIS — Z8711 Personal history of peptic ulcer disease: Secondary | ICD-10-CM

## 2018-06-24 DIAGNOSIS — Y92239 Unspecified place in hospital as the place of occurrence of the external cause: Secondary | ICD-10-CM | POA: Diagnosis not present

## 2018-06-24 DIAGNOSIS — Z801 Family history of malignant neoplasm of trachea, bronchus and lung: Secondary | ICD-10-CM

## 2018-06-24 LAB — CBC WITH DIFFERENTIAL/PLATELET
Abs Immature Granulocytes: 0.17 10*3/uL — ABNORMAL HIGH (ref 0.00–0.07)
Basophils Absolute: 0.1 10*3/uL (ref 0.0–0.1)
Basophils Relative: 1 %
Eosinophils Absolute: 0.3 10*3/uL (ref 0.0–0.5)
Eosinophils Relative: 3 %
HCT: 37 % (ref 36.0–46.0)
Hemoglobin: 11.3 g/dL — ABNORMAL LOW (ref 12.0–15.0)
Immature Granulocytes: 2 %
Lymphocytes Relative: 39 %
Lymphs Abs: 3.6 10*3/uL (ref 0.7–4.0)
MCH: 30.5 pg (ref 26.0–34.0)
MCHC: 30.5 g/dL (ref 30.0–36.0)
MCV: 100 fL (ref 80.0–100.0)
Monocytes Absolute: 0.8 10*3/uL (ref 0.1–1.0)
Monocytes Relative: 8 %
Neutro Abs: 4.3 10*3/uL (ref 1.7–7.7)
Neutrophils Relative %: 47 %
Platelets: UNDETERMINED 10*3/uL (ref 150–400)
RBC: 3.7 MIL/uL — ABNORMAL LOW (ref 3.87–5.11)
RDW: 16.6 % — ABNORMAL HIGH (ref 11.5–15.5)
WBC: 9.3 10*3/uL (ref 4.0–10.5)
nRBC: 0 % (ref 0.0–0.2)

## 2018-06-24 LAB — RESPIRATORY PANEL BY PCR

## 2018-06-24 LAB — MRSA PCR SCREENING: MRSA by PCR: NEGATIVE

## 2018-06-24 LAB — POCT I-STAT 7, (LYTES, BLD GAS, ICA,H+H)
Acid-base deficit: 3 mmol/L — ABNORMAL HIGH (ref 0.0–2.0)
Acid-base deficit: 1 mmol/L (ref 0.0–2.0)
Bicarbonate: 22.8 mmol/L (ref 20.0–28.0)
Bicarbonate: 22.9 mmol/L (ref 20.0–28.0)
Calcium, Ion: 1.26 mmol/L (ref 1.15–1.40)
Calcium, Ion: 1.16 mmol/L (ref 1.15–1.40)
HCT: 35 % — ABNORMAL LOW (ref 36.0–46.0)
HCT: 38 % (ref 36.0–46.0)
Hemoglobin: 11.9 g/dL — ABNORMAL LOW (ref 12.0–15.0)
Hemoglobin: 12.9 g/dL (ref 12.0–15.0)
O2 Saturation: 100 %
O2 Saturation: 100 %
Patient temperature: 97
Patient temperature: 96.9
Potassium: 3.5 mmol/L (ref 3.5–5.1)
Potassium: 3.1 mmol/L — ABNORMAL LOW (ref 3.5–5.1)
Sodium: 141 mmol/L (ref 135–145)
Sodium: 143 mmol/L (ref 135–145)
TCO2: 24 mmol/L (ref 22–32)
TCO2: 24 mmol/L (ref 22–32)
pCO2 arterial: 41.3 mmHg (ref 32.0–48.0)
pCO2 arterial: 34.2 mmHg (ref 32.0–48.0)
pH, Arterial: 7.346 — ABNORMAL LOW (ref 7.350–7.450)
pH, Arterial: 7.431 (ref 7.350–7.450)
pO2, Arterial: 260 mmHg — ABNORMAL HIGH (ref 83.0–108.0)
pO2, Arterial: 229 mmHg — ABNORMAL HIGH (ref 83.0–108.0)

## 2018-06-24 LAB — BLOOD GAS, ARTERIAL
Acid-base deficit: 6.4 mmol/L — ABNORMAL HIGH (ref 0.0–2.0)
Bicarbonate: 24 mmol/L (ref 20.0–28.0)
Drawn by: 34560
FIO2: 100
O2 Saturation: 98.5 %
Patient temperature: 98.6
pCO2 arterial: 105 mmHg (ref 32.0–48.0)
pH, Arterial: 6.989 — CL (ref 7.350–7.450)
pO2, Arterial: 293 mmHg — ABNORMAL HIGH (ref 83.0–108.0)

## 2018-06-24 LAB — COMPREHENSIVE METABOLIC PANEL
ALT: 34 U/L (ref 0–44)
AST: 46 U/L — ABNORMAL HIGH (ref 15–41)
Albumin: 2.6 g/dL — ABNORMAL LOW (ref 3.5–5.0)
Alkaline Phosphatase: 94 U/L (ref 38–126)
Anion gap: 11 (ref 5–15)
BUN: 14 mg/dL (ref 6–20)
CO2: 19 mmol/L — ABNORMAL LOW (ref 22–32)
Calcium: 7.5 mg/dL — ABNORMAL LOW (ref 8.9–10.3)
Chloride: 108 mmol/L (ref 98–111)
Creatinine, Ser: 1.04 mg/dL — ABNORMAL HIGH (ref 0.44–1.00)
GFR calc Af Amer: >60 mL/min (ref >60)
GFR calc non Af Amer: 59 mL/min — ABNORMAL LOW (ref >60)
Glucose, Bld: 201 mg/dL — ABNORMAL HIGH (ref 70–99)
Potassium: 2.7 mmol/L — CL (ref 3.5–5.1)
Sodium: 138 mmol/L (ref 135–145)
Total Bilirubin: 1.3 mg/dL — ABNORMAL HIGH (ref 0.3–1.2)
Total Protein: 5.7 g/dL — ABNORMAL LOW (ref 6.5–8.1)

## 2018-06-24 LAB — COOXEMETRY PANEL
Carboxyhemoglobin: 1.4 % (ref 0.5–1.5)
Methemoglobin: 0.8 % (ref 0.0–1.5)
O2 Saturation: 66.4 %
Total hemoglobin: 12.6 g/dL (ref 12.0–16.0)

## 2018-06-24 LAB — BRAIN NATRIURETIC PEPTIDE: B Natriuretic Peptide: 1614.4 pg/mL — ABNORMAL HIGH (ref 0.0–100.0)

## 2018-06-24 LAB — CBG MONITORING, ED
Glucose-Capillary: 153 mg/dL — ABNORMAL HIGH (ref 70–99)
Glucose-Capillary: 147 mg/dL — ABNORMAL HIGH (ref 70–99)

## 2018-06-24 LAB — ECHOCARDIOGRAM COMPLETE
Height: 64 in
Weight: 1936.52 oz

## 2018-06-24 LAB — TROPONIN I
Troponin I: 0.03 ng/mL (ref <0.03)
Troponin I: 0.04 ng/mL (ref <0.03)
Troponin I: <0.03 ng/mL (ref <0.03)

## 2018-06-24 LAB — DIGOXIN LEVEL: Digoxin Level: <0.2 ng/mL — ABNORMAL LOW (ref 0.8–2.0)

## 2018-06-24 LAB — PHOSPHORUS: Phosphorus: 4.8 mg/dL — ABNORMAL HIGH (ref 2.5–4.6)

## 2018-06-24 LAB — MAGNESIUM: Magnesium: 2.2 mg/dL (ref 1.7–2.4)

## 2018-06-24 LAB — STREP PNEUMONIAE URINARY ANTIGEN: Strep Pneumo Urinary Antigen: NEGATIVE

## 2018-06-24 LAB — HIV ANTIBODY (ROUTINE TESTING W REFLEX): HIV Screen 4th Generation wRfx: NONREACTIVE

## 2018-06-24 LAB — GLUCOSE, CAPILLARY
Glucose-Capillary: 136 mg/dL — ABNORMAL HIGH (ref 70–99)
Glucose-Capillary: 152 mg/dL — ABNORMAL HIGH (ref 70–99)

## 2018-06-24 LAB — PROCALCITONIN: Procalcitonin: 0.47 ng/mL

## 2018-06-24 LAB — SARS CORONAVIRUS 2: SARS Coronavirus 2: NOT DETECTED

## 2018-06-24 LAB — LACTIC ACID, PLASMA: Lactic Acid, Venous: 2.2 mmol/L (ref 0.5–1.9)

## 2018-06-24 MED ORDER — IPRATROPIUM-ALBUTEROL 0.5-2.5 (3) MG/3ML IN SOLN
3.0000 mL | RESPIRATORY_TRACT | Status: DC
  Administered 2018-06-24 – 2018-06-25 (×8): 3 mL via RESPIRATORY_TRACT
  Filled 2018-06-24 (×9): qty 3

## 2018-06-24 MED ORDER — FUROSEMIDE 10 MG/ML IJ SOLN
INTRAMUSCULAR | Status: AC
  Filled 2018-06-24: qty 4

## 2018-06-24 MED ORDER — DEXMEDETOMIDINE HCL IN NACL 200 MCG/50ML IV SOLN
0.4000 ug/kg/h | INTRAVENOUS | Status: DC
  Administered 2018-06-24 (×2): 0.4 ug/kg/h via INTRAVENOUS
  Administered 2018-06-25: 0.6 ug/kg/h via INTRAVENOUS
  Filled 2018-06-24 (×2): qty 50

## 2018-06-24 MED ORDER — FUROSEMIDE 10 MG/ML IJ SOLN
40.0000 mg | Freq: Once | INTRAMUSCULAR | Status: AC
  Administered 2018-06-24: 03:00:00 40 mg via INTRAVENOUS

## 2018-06-24 MED ORDER — FENTANYL CITRATE (PF) 100 MCG/2ML IJ SOLN
50.0000 ug | INTRAMUSCULAR | Status: DC | PRN
  Administered 2018-06-25 (×2): 100 ug via INTRAVENOUS
  Filled 2018-06-24 (×2): qty 2

## 2018-06-24 MED ORDER — POTASSIUM CHLORIDE 20 MEQ/15ML (10%) PO SOLN
40.0000 meq | Freq: Once | ORAL | Status: AC
  Administered 2018-06-24: 08:00:00 40 meq
  Filled 2018-06-24: qty 30

## 2018-06-24 MED ORDER — LORAZEPAM 2 MG/ML IJ SOLN
0.5000 mg | Freq: Once | INTRAMUSCULAR | Status: AC
  Administered 2018-06-24: 0.5 mg via INTRAVENOUS

## 2018-06-24 MED ORDER — CHLORHEXIDINE GLUCONATE CLOTH 2 % EX PADS
6.0000 | MEDICATED_PAD | Freq: Every day | CUTANEOUS | Status: DC
  Administered 2018-06-24 – 2018-06-25 (×2): 6 via TOPICAL

## 2018-06-24 MED ORDER — FENTANYL 2500MCG IN NS 250ML (10MCG/ML) PREMIX INFUSION
0.0000 ug/h | INTRAVENOUS | Status: DC
  Administered 2018-06-24: 25 ug/h via INTRAVENOUS
  Filled 2018-06-24: qty 250

## 2018-06-24 MED ORDER — METHYLPREDNISOLONE SODIUM SUCC 40 MG IJ SOLR
40.0000 mg | Freq: Four times a day (QID) | INTRAMUSCULAR | Status: DC
  Administered 2018-06-24 – 2018-06-25 (×5): 40 mg via INTRAVENOUS
  Filled 2018-06-24 (×5): qty 1

## 2018-06-24 MED ORDER — BUDESONIDE 0.5 MG/2ML IN SUSP: 0.5000 mg | Freq: Two times a day (BID) | RESPIRATORY_TRACT | Status: DC

## 2018-06-24 MED ORDER — FAMOTIDINE IN NACL 20-0.9 MG/50ML-% IV SOLN
20.0000 mg | Freq: Two times a day (BID) | INTRAVENOUS | Status: DC
  Administered 2018-06-24 (×2): 20 mg via INTRAVENOUS
  Filled 2018-06-24 (×2): qty 50

## 2018-06-24 MED ORDER — CHLORHEXIDINE GLUCONATE 0.12% ORAL RINSE (MEDLINE KIT)
15.0000 mL | Freq: Two times a day (BID) | OROMUCOSAL | Status: DC
  Administered 2018-06-24 – 2018-06-25 (×3): 15 mL via OROMUCOSAL

## 2018-06-24 MED ORDER — ETOMIDATE 2 MG/ML IV SOLN
INTRAVENOUS | Status: AC | PRN
  Administered 2018-06-24: 20 mg via INTRAVENOUS

## 2018-06-24 MED ORDER — SODIUM CHLORIDE 0.9% FLUSH
10.0000 mL | Freq: Two times a day (BID) | INTRAVENOUS | Status: DC
  Administered 2018-06-24 – 2018-06-25 (×2): 10 mL

## 2018-06-24 MED ORDER — CHLORHEXIDINE GLUCONATE 0.12% ORAL RINSE (MEDLINE KIT): 15.0000 mL | Freq: Two times a day (BID) | OROMUCOSAL | Status: DC

## 2018-06-24 MED ORDER — SODIUM CHLORIDE 0.9% FLUSH: 10.0000 mL | INTRAVENOUS | Status: DC | PRN

## 2018-06-24 MED ORDER — VANCOMYCIN HCL IN DEXTROSE 1-5 GM/200ML-% IV SOLN
1000.0000 mg | Freq: Once | INTRAVENOUS | Status: AC
  Administered 2018-06-24: 05:00:00 1000 mg via INTRAVENOUS
  Filled 2018-06-24: qty 200

## 2018-06-24 MED ORDER — DOCUSATE SODIUM 50 MG/5ML PO LIQD
100.0000 mg | Freq: Two times a day (BID) | ORAL | Status: DC | PRN
  Filled 2018-06-24: qty 10

## 2018-06-24 MED ORDER — PIPERACILLIN-TAZOBACTAM 3.375 G IVPB
3.3750 g | Freq: Once | INTRAVENOUS | Status: AC
  Administered 2018-06-24: 05:00:00 3.375 g via INTRAVENOUS
  Filled 2018-06-24: qty 50

## 2018-06-24 MED ORDER — FENTANYL CITRATE (PF) 100 MCG/2ML IJ SOLN: 50.0000 ug | INTRAMUSCULAR | Status: DC | PRN

## 2018-06-24 MED ORDER — HEPARIN SODIUM (PORCINE) 5000 UNIT/ML IJ SOLN
5000.0000 [IU] | Freq: Three times a day (TID) | INTRAMUSCULAR | Status: DC
  Filled 2018-06-24: qty 1

## 2018-06-24 MED ORDER — MIDAZOLAM HCL 2 MG/2ML IJ SOLN
2.0000 mg | Freq: Once | INTRAMUSCULAR | Status: AC
  Administered 2018-06-24: 04:00:00 2 mg via INTRAVENOUS
  Filled 2018-06-24: qty 2

## 2018-06-24 MED ORDER — INSULIN ASPART 100 UNIT/ML ~~LOC~~ SOLN
0.0000 [IU] | SUBCUTANEOUS | Status: DC
  Administered 2018-06-24: 1 [IU] via SUBCUTANEOUS
  Administered 2018-06-24 – 2018-06-25 (×3): 2 [IU] via SUBCUTANEOUS
  Administered 2018-06-25 (×2): 1 [IU] via SUBCUTANEOUS

## 2018-06-24 MED ORDER — PIPERACILLIN-TAZOBACTAM 3.375 G IVPB
3.3750 g | Freq: Three times a day (TID) | INTRAVENOUS | Status: AC
  Administered 2018-06-24 – 2018-06-27 (×10): 3.375 g via INTRAVENOUS
  Filled 2018-06-24 (×12): qty 50

## 2018-06-24 MED ORDER — BISACODYL 10 MG RE SUPP: 10.0000 mg | Freq: Every day | RECTAL | Status: DC | PRN

## 2018-06-24 MED ORDER — ENOXAPARIN SODIUM 40 MG/0.4ML ~~LOC~~ SOLN
40.0000 mg | SUBCUTANEOUS | Status: DC
  Administered 2018-06-24 – 2018-06-25 (×2): 40 mg via SUBCUTANEOUS
  Filled 2018-06-24 (×4): qty 0.4

## 2018-06-24 MED ORDER — SUCCINYLCHOLINE CHLORIDE 20 MG/ML IJ SOLN
INTRAMUSCULAR | Status: AC | PRN
  Administered 2018-06-24: 8235 mg via INTRAVENOUS

## 2018-06-24 MED ORDER — LORAZEPAM 2 MG/ML IJ SOLN
INTRAMUSCULAR | Status: AC
  Filled 2018-06-24: qty 1

## 2018-06-24 MED ORDER — ORAL CARE MOUTH RINSE
15.0000 mL | OROMUCOSAL | Status: DC
  Administered 2018-06-24 – 2018-06-25 (×5): 15 mL via OROMUCOSAL

## 2018-06-24 MED ORDER — ORAL CARE MOUTH RINSE
15.0000 mL | OROMUCOSAL | Status: DC
  Administered 2018-06-25 – 2018-06-26 (×10): 15 mL via OROMUCOSAL

## 2018-06-24 MED ORDER — NOREPINEPHRINE 4 MG/250ML-% IV SOLN
0.0000 ug/min | INTRAVENOUS | Status: DC
  Administered 2018-06-24: 2 ug/min via INTRAVENOUS
  Filled 2018-06-24: qty 250

## 2018-06-24 MED ORDER — VANCOMYCIN HCL IN DEXTROSE 750-5 MG/150ML-% IV SOLN
750.0000 mg | INTRAVENOUS | Status: DC
  Administered 2018-06-25: 05:00:00 750 mg via INTRAVENOUS
  Filled 2018-06-24: qty 150

## 2018-06-24 MED ORDER — PROPOFOL 1000 MG/100ML IV EMUL
5.0000 ug/kg/min | INTRAVENOUS | Status: DC
  Administered 2018-06-24: 04:00:00 5 ug/kg/min via INTRAVENOUS
  Filled 2018-06-24: qty 100

## 2018-06-24 NOTE — ED Notes (Signed)
BP cuff moved back to left arm. Pt repositioned with left hip propped on pillows. Heels off of bed.

## 2018-06-24 NOTE — ED Notes (Signed)
Echo being done at bedside.

## 2018-06-24 NOTE — Progress Notes (Signed)
I spoke with patients sister [name] and updated on pt condition, answered all questions. Ellamae Sia

## 2018-06-24 NOTE — ED Notes (Signed)
BP cuff moved from left arm to right arm.

## 2018-06-24 NOTE — ED Notes (Signed)
Urine output: 1000mL 

## 2018-06-24 NOTE — ED Notes (Signed)
Fentanyl drip d/c. Remainder of drip wasted with on-coming RN, Lynnze.

## 2018-06-24 NOTE — Procedures (Signed)
Central Venous Catheter Insertion Procedure Note [name] [id number] 19-Oct-1960  Procedure: Insertion of Central Venous Catheter Indications: Drug and/or fluid administration  Procedure Details Consent: Unable to obtain consent because of altered level of consciousness. Time Out: Verified patient identification, verified procedure, site/side was marked, verified correct patient position, special equipment/implants available, medications/allergies/relevent history reviewed, required imaging and test results available.  Performed  Maximum sterile technique was used including antiseptics, cap, gloves, gown, hand hygiene, mask and sheet. Skin prep: Chlorhexidine; local anesthetic administered A antimicrobial bonded/coated triple lumen catheter was placed in the left subclavian vein using the Seldinger technique.  Evaluation Blood flow good Complications: No apparent complications, line crossed midline and went to right brachiocephalic v, pulled back 4 cm repeat film looks good Patient did tolerate procedure well. Chest X-ray ordered to verify placement.  CXR: as above.  Shellia Cleverly 06/24/2018, 7:00 AM

## 2018-06-24 NOTE — ED Provider Notes (Signed)
[facility] EMERGENCY DEPARTMENT Provider Note   CSN: [id number] Arrival date & time: 06/24/18  0225    History   Chief Complaint Chief Complaint  Patient presents with  . Respiratory Distress    HPI [name] is a 58 y.o. female.     This patient is a 58 year old female with past medical history of sarcoidosis, atrial flutter, anxiety, hypertension.  She was brought today by EMS for evaluation of respiratory distress.  Patient has been having upper respiratory infection-like symptoms over the past week.  She was seen by her primary doctor and was diagnosed with pneumonia.  She was started on Zithromax.  COVID-19 test at the time was negative.  This evening, she developed much worsening of her breathing.  She appeared confused and 911 was called.  Patient was transported here in severe respiratory distress and unable to offer any useful information.  The history is provided by the EMS personnel and a relative (Patient's sister).    Past Medical History:  Diagnosis Date  . AAA (abdominal aortic aneurysm) (HCC)    a. 4.2 cm by CT in 06/2016  . Allergy   . Anxiety   . Asthma   . Atrial flutter (HCC)    a. on Flecainide and Eliquis  . Cataract   . Depression    pt states no depression 07-11-15  . Glaucoma   . Heart murmur    as child only  . Herpes simplex   . Hypertension   . Multiple thyroid nodules   . Osteoarthritis   . Osteoporosis   . PUD (peptic ulcer disease)   . Pulmonary sarcoidosis ([place])   . Tubular adenoma of colon 07/2015  . Vitamin D deficiency     Patient Active Problem List   Diagnosis Date Noted  . Pulmonary sarcoidosis ([place])   . PUD (peptic ulcer disease)   . Multiple thyroid nodules   . Heart murmur   . Depression   . Cataract   . Atrial flutter ([place])   . Anxiety   . Allergy   . AAA (abdominal aortic aneurysm) ([place])   . Weight loss 02/29/2016  . Atrial fibrillation ([place]) 01/23/2016  . Tubular adenoma of colon  07/15/2015  . Acute maxillary sinusitis 04/08/2015  . Colon cancer screening 02/21/2015  . Abdominal pain, left lower quadrant 06/20/2014  . Glaucoma 05/31/2014  . Loose stools 05/31/2014  . Dislocation of finger PIP joint 01/03/2014  . Finger pain, right 12/28/2013  . B12 deficiency 12/15/2013  . Hypercalcemia 09/10/2013  . Creatinine elevation 09/10/2013  . Nonallopathic lesion of cervical region 06/11/2013  . Elevated LFTs 05/21/2013  . Greater trochanteric bursitis of right hip 05/14/2013  . Hypokalemia 04/12/2013  . Right acetabular fracture ([place]) 04/02/2013  . Acute right hip pain 03/31/2013  . Right sciatic nerve pain 02/26/2013  . Neck pain on right side 02/26/2013  . Exposure to the flu 01/25/2013  . Fall due to stumbling 07/24/2012  . Onychomycosis 04/27/2012  . Dizziness 01/14/2012  . Abdominal pain, left upper quadrant 09/30/2011  . Irritable bladder 09/02/2011  . Hyperglycemia 09/02/2011  . Nasal turbinate hypertrophy 03/08/2011  . Hypersomnia 09/12/2010  . VAGINITIS 02/27/2010  . Headache(784.0) 02/13/2010  . TMJ PAIN 10/20/2009  . GRIEF REACTION 02/23/2009  . PERIMENOPAUSAL SYNDROME 05/24/2008  . FATIGUE 12/07/2007  . TACHYCARDIA 12/07/2007  . GANGLION CYST, WRIST, LEFT 07/17/2007  . Sarcoidosis 06/28/2007  . Allergic rhinitis 03/24/2007  . Osteoarthritis 03/24/2007  . HAND PAIN 03/24/2007  .  TINEA PEDIS 12/10/2006  . Depression with anxiety 12/10/2006  . Asthma, moderate persistent 12/10/2006  . GLAUCOMA NOS 11/06/2006  . Essential hypertension 11/06/2006  . OSTEOPOROSIS 11/06/2006    Past Surgical History:  Procedure Laterality Date  . BUNIONECTOMY     x2  . CATARACT EXTRACTION Right   . DENTAL SURGERY    . DILATION AND CURETTAGE OF UTERUS    . fibroid tumor removal    . GLAUCOMA SURGERY     Right eye  . MYOMECTOMY       OB History   No obstetric history on file.      Home Medications    Prior to Admission medications   Medication  Sig Start Date End Date Taking? Authorizing Provider  acyclovir (ZOVIRAX) 200 MG capsule TAKE 1-2 CAPSULES (200-400 MG TOTAL) BY MOUTH DAILY. 10/14/17   Plotnikov, Evie Lacks, MD  acyclovir cream (ZOVIRAX) 5 % Apply 1 application topically every 3 (three) hours. 01/07/18   Plotnikov, Evie Lacks, MD  amLODipine (NORVASC) 5 MG tablet Take 0.5 tablets (2.5 mg total) by mouth daily. 01/02/18   Plotnikov, Evie Lacks, MD  apixaban (ELIQUIS) 5 MG TABS tablet Take 1 tablet (5 mg total) by mouth every 12 (twelve) hours. Patient taking differently: Take 2.5 mg by mouth every 12 (twelve) hours.  09/01/17   Plotnikov, Evie Lacks, MD  digoxin (LANOXIN) 0.125 MG tablet TAKE 1 TABLET (0.125 MG TOTAL) BY MOUTH DAILY. 12/04/17   Plotnikov, Evie Lacks, MD  Dorzolamide HCl-Timolol Mal (COSOPT OP) Apply 1 drop to eye as needed.    [provider]  hydroxypropyl methylcellulose / hypromellose (ISOPTO TEARS / GONIOVISC) 2.5 % ophthalmic solution Place 1 drop into the right eye daily.    [provider]  KLOR-CON/EF 25 MEQ disintegrating tablet TAKE 1 TABLET AND ALLOW TO DISSOLVE IN 4 OZ OF COLD WATER BEFORE DRINKING 2 TIMES DAILY 12/15/17   Plotnikov, Evie Lacks, MD  loratadine (CLARITIN) 10 MG tablet Take 10 mg by mouth daily.    [provider]  losartan (COZAAR) 100 MG tablet TAKE 1/2 TABLET BY MOUTH DAILY 01/20/18   Plotnikov, Evie Lacks, MD  Multiple Vitamins-Minerals (HAIR SKIN & NAILS ADVANCED PO) Take 2 capsules by mouth daily.    [provider]  vitamin B-12 (CYANOCOBALAMIN) 100 MCG tablet Take 100 mcg by mouth daily. Reported on 06/20/2015    [provider]    Family History Family History  Problem Relation Age of Onset  . Lung cancer Mother        sarcoid  . Stroke Father   . Hypertension Father   . Heart Problems Father   . Stomach cancer Cousin        3rd cousin   . Colon cancer Neg Hx   . Colon polyps Neg Hx   . Esophageal cancer Neg Hx   . Rectal cancer Neg Hx      Social History Social History   Tobacco Use  . Smoking status: Never Smoker  . Smokeless tobacco: Never Used  Substance Use Topics  . Alcohol use: No    Alcohol/week: 0.0 standard drinks  . Drug use: No     Allergies   Bystolic [nebivolol hcl]; Doxycycline; Amoxicillin-pot clavulanate; Amoxicillin-pot clavulanate; Cardizem [diltiazem hcl]; Carvedilol; Cefdinir; Cephalexin; Hydrochlorothiazide; Influenza vaccines; Pneumovax [pneumococcal polysaccharide vaccine]; Prednisone; Sulfamethoxazole; Sulfasalazine; Sulfonamide derivatives; and Verapamil   Review of Systems Review of Systems  Unable to perform ROS: Acuity of condition     Physical Exam Updated  Vital Signs BP (!) 67/35   Pulse 74   Temp 98.1 F (36.7 C)   Resp (!) 24   Ht 5\' 4"  (1.626 m)   Wt 54.9 kg   SpO2 100%   BMI 20.78 kg/m   Physical Exam Vitals signs and nursing note reviewed.  Constitutional:      General: She is in acute distress.     Appearance: She is well-developed. She is not diaphoretic.     Comments: Patient is acutely ill-appearing.  She is severe respiratory distress.  HENT:     Head: Normocephalic and atraumatic.  Neck:     Musculoskeletal: Normal range of motion and neck supple.  Cardiovascular:     Rate and Rhythm: Normal rate and regular rhythm.     Heart sounds: No murmur. No friction rub. No gallop.   Pulmonary:     Effort: Respiratory distress present.     Breath sounds: Rhonchi present. No wheezing.  Abdominal:     General: Bowel sounds are normal. There is no distension.     Palpations: Abdomen is soft.     Tenderness: There is no abdominal tenderness.  Musculoskeletal: Normal range of motion.        General: No swelling or tenderness.     Right lower leg: Edema present.     Left lower leg: Edema present.  Skin:    General: Skin is warm and dry.  Neurological:     Mental Status: She is alert and oriented to person, place, and time.      ED Treatments / Results   Labs (all labs ordered are listed, but only abnormal results are displayed) Labs Reviewed  CBC WITH DIFFERENTIAL/PLATELET - Abnormal; Notable for the following components:      Result Value   RBC 3.70 (*)    Hemoglobin 11.3 (*)    RDW 16.6 (*)    Abs Immature Granulocytes 0.17 (*)    All other components within normal limits  COMPREHENSIVE METABOLIC PANEL - Abnormal; Notable for the following components:   Potassium 2.7 (*)    CO2 19 (*)    Glucose, Bld 201 (*)    Creatinine, Ser 1.04 (*)    Calcium 7.5 (*)    Total Protein 5.7 (*)    Albumin 2.6 (*)    AST 46 (*)    Total Bilirubin 1.3 (*)    GFR calc non Af Amer 59 (*)    All other components within normal limits  BRAIN NATRIURETIC PEPTIDE - Abnormal; Notable for the following components:   B Natriuretic Peptide 1,614.4 (*)    All other components within normal limits  TROPONIN I - Abnormal; Notable for the following components:   Troponin I 0.03 (*)    All other components within normal limits  BLOOD GAS, ARTERIAL - Abnormal; Notable for the following components:   pH, Arterial 6.989 (*)    pCO2 arterial 105 (*)    pO2, Arterial 293 (*)    Acid-base deficit 6.4 (*)    All other components within normal limits  POCT I-STAT 7, (LYTES, BLD GAS, ICA,H+H) - Abnormal; Notable for the following components:   pH, Arterial 7.346 (*)    pO2, Arterial 260.0 (*)    Acid-base deficit 3.0 (*)    HCT 35.0 (*)    Hemoglobin 11.9 (*)    All other components within normal limits  SARS CORONAVIRUS 2  I-STAT ARTERIAL BLOOD GAS, ED    EKG EKG Interpretation  Date/Time:  Wednesday June 24 2018 02:32:09 EDT Ventricular Rate:  120 PR Interval:    QRS Duration: 127 QT Interval:  343 QTC Calculation: 485 R Axis:   -76 Text Interpretation:  Atrial fibrillation RBBB and LAFB Nonspecific T abnormalities, lateral leads Confirmed by Veryl Speak [phone]) on 06/24/2018 4:44:51 AM   Radiology Dg Chest Portable 1 View  Result Date:  06/24/2018 CLINICAL DATA:  Post intubation. EXAM: PORTABLE CHEST 1 VIEW COMPARISON:  06/24/2018 FINDINGS: The endotracheal tube terminates in the right mainstem bronchus. The enteric tube terminates below the left hemidiaphragm. Again identified are diffuse bilateral airspace opacities similar to prior study. There is no pneumothorax, however the right costophrenic angle is not visualized. There is no acute osseous abnormality. IMPRESSION: 1. Endotracheal tube terminates in the right mainstem bronchus. Repositioning is recommended. 2. OG tube terminates below the left hemidiaphragm. The tip is not visualized on this exam. 3. Stable cardiac enlargement. 4. Persistent diffuse bilateral airspace opacities similar to prior study. Endotracheal tube positioning was discussed with RN Baliff by Dr. Nyoka Cowden at approximately 357 a.m. on 06/24/2018. Electronically Signed   By: Constance Holster M.D.   On: 06/24/2018 03:59   Dg Chest Portable 1 View  Result Date: 06/24/2018 CLINICAL DATA:  Respiratory distress. EXAM: PORTABLE CHEST 1 VIEW COMPARISON:  12/17/2016 FINDINGS: Heart size is enlarged. There are diffuse bilateral hazy airspace opacities. There are prominent interstitial lung markings. There is a small right-sided pleural effusion. There may be a trace left-sided pleural effusion. There is no pneumothorax. No displaced fracture. IMPRESSION: 1. Cardiomegaly. 2. Worsening bilateral multifocal airspace opacities may represent progressive sarcoidosis, pulmonary edema, or an atypical infectious process. 3. There is a small right-sided pleural effusion and likely a trace left-sided pleural effusion. Electronically Signed   By: Constance Holster M.D.   On: 06/24/2018 03:14    Procedures Procedures (including critical care time)  Medications Ordered in ED Medications  propofol (DIPRIVAN) 1000 MG/100ML infusion (5 mcg/kg/min  54.9 kg Intravenous New Bag/Given 06/24/18 0330)  furosemide (LASIX) injection 40 mg (40  mg Intravenous Given 06/24/18 0248)  LORazepam (ATIVAN) injection 0.5 mg (0.5 mg Intravenous Given 06/24/18 0249)  etomidate (AMIDATE) injection (20 mg Intravenous Given 06/24/18 0320)  succinylcholine (ANECTINE) injection (8,235 mg Intravenous Given 06/24/18 0322)  midazolam (VERSED) injection 2 mg (2 mg Intravenous Given 06/24/18 0420)     Initial Impression / Assessment and Plan / ED Course  I have reviewed the triage vital signs and the nursing notes.  Pertinent labs & imaging results that were available during my care of the patient were reviewed by me and considered in my medical decision making (see chart for details).  Patient arrived here in severe respiratory distress.  She was unable to communicate secondary to acuity of condition.  I spoke with the patient's sister, [name] to address the patient's CODE STATUS.  She told me the patient would like intubation if necessary.  An attempt to avoid intubation was made.  Patient was given Ativan, IV Lasix, and maintained on a nonrebreather.  However her blood gas returned with a pH less than 7 with a PCO2 of 105.  Patient became more obtunded and ultimately required intubation.  RSI was performed using 20 mg of etomidate and 150 mg of succinylcholine.  The cords were easily visualized using the glide scope and a 7.5 ET tube was easily placed.  Tube placement was confirmed with direct visualization, end-tidal CO2, and auscultation over the chest and stomach.  Laboratory studies returned otherwise essentially  unremarkable with the exception of an elevated BNP.  Her COVID-19 swab returned as negative.  Patient remained hypotensive post intubation.  She will be given a trial of intravenous fluids.  If her blood pressures do not normalize, she may require pressors.  Care was discussed with Dr. Oletta Darter from critical care.  Patient will be evaluated by PCCM and admitted to their service.  CRITICAL CARE Performed by: Veryl Speak Total critical care  time: 70 minutes Critical care time was exclusive of separately billable procedures and treating other patients. Critical care was necessary to treat or prevent imminent or life-threatening deterioration. Critical care was time spent personally by me on the following activities: development of treatment plan with patient and/or surrogate as well as nursing, discussions with consultants, evaluation of patient's response to treatment, examination of patient, obtaining history from patient or surrogate, ordering and performing treatments and interventions, ordering and review of laboratory studies, ordering and review of radiographic studies, pulse oximetry and re-evaluation of patient's condition.   Final Clinical Impressions(s) / ED Diagnoses   Final diagnoses:  None    ED Discharge Orders    None       Veryl Speak, MD 06/24/18 [phone]

## 2018-06-24 NOTE — Progress Notes (Signed)
Pt transported on ventilator from ED to 75M room 6 without any apparent complications.

## 2018-06-24 NOTE — ED Notes (Signed)
Page sent to critical care.

## 2018-06-24 NOTE — ED Triage Notes (Signed)
BIB [facility] from home with c/o of sudden onset respiratory distress. Recently diagnosed with pneumonia. Hx of pulmonary sarcoidosis. Pt 100% on NRB but use of accessory muscles and labored breathing. Received dou-neb, .5 atrovent, 125 solu-medrol, .3 epi IM, 2g mag.

## 2018-06-24 NOTE — ED Notes (Signed)
ED TO INPATIENT HANDOFF REPORT  ED Nurse Name and Phone #: Ardelle Park RN [phone]  S Name/Age/Gender [name] 58 y.o. female Room/Bed: 029C/029C  Code Status   Code Status: Full Code  Home/SNF/Other Home Patient oriented to: self Is this baseline? No   Triage Complete: Triage complete  Chief Complaint RESP DISTRESS  Triage Note BIB [facility] from home with c/o of sudden onset respiratory distress. Recently diagnosed with pneumonia. Hx of pulmonary sarcoidosis. Pt 100% on NRB but use of accessory muscles and labored breathing. Received dou-neb, .5 atrovent, 125 solu-medrol, .3 epi IM, 2g mag.    Allergies Allergies  Allergen Reactions  . Bystolic [Nebivolol Hcl] Shortness Of Breath  . Doxycycline Shortness Of Breath  . Amoxicillin-Pot Clavulanate     REACTION: GI symptoms  . Amoxicillin-Pot Clavulanate Other (See Comments)    REACTION: GI symptoms  . Cardizem [Diltiazem Hcl] Other (See Comments)    HAs Pt can take Norvasc HAs Pt can take Norvasc  . Carvedilol Other (See Comments)    REACTION: tired REACTION: tired  . Cefdinir Diarrhea  . Cephalexin Diarrhea    REACTION: unspecified  . Hydrochlorothiazide Other (See Comments)    Dropped BP and caused hair loss   . Influenza Vaccines Other (See Comments)    Allergic to flu vaccinations per pt.  Soreness and "cold symptoms" per pt Allergic to flu vaccinations per pt.  Soreness and "cold symptoms" per pt  . Pneumovax [Pneumococcal Polysaccharide Vaccine]     ?reaction  . Prednisone Hypertension and Other (See Comments)  . Sulfamethoxazole     Other reaction(s): Unknown REACTION: unspecified REACTION: unspecified  . Sulfasalazine     Other reaction(s): Unknown  . Sulfonamide Derivatives   . Verapamil Other (See Comments)    fatigue fatigue    Level of Care/Admitting Diagnosis ED Disposition    ED Disposition Condition Comment   Admit  Hospital Area: [facility] [[id number]]  Level of  Care: ICU [6]  Covid Evaluation: Confirmed COVID Negative  Diagnosis: Acute respiratory failure ([place]) [518.81.ICD-9-CM]  Admitting Physician: Shellia Cleverly [[id number]]  Attending Physician: Shellia Cleverly [[id number]]  Estimated length of stay: 3 - 4 days  Certification:: I certify this patient will need inpatient services for at least 2 midnights  PT Class (Do Not Modify): Inpatient [101]  PT Acc Code (Do Not Modify): Private [1]       B Medical/Surgery History Past Medical History:  Diagnosis Date  . AAA (abdominal aortic aneurysm) (HCC)    a. 4.2 cm by CT in 06/2016  . Allergy   . Anxiety   . Asthma   . Atrial flutter (HCC)    a. on Flecainide and Eliquis  . Cataract   . Depression    pt states no depression 07-11-15  . Glaucoma   . Heart murmur    as child only  . Herpes simplex   . Hypertension   . Multiple thyroid nodules   . Osteoarthritis   . Osteoporosis   . PUD (peptic ulcer disease)   . Pulmonary sarcoidosis ([place])   . Tubular adenoma of colon 07/2015  . Vitamin D deficiency    Past Surgical History:  Procedure Laterality Date  . BUNIONECTOMY     x2  . CATARACT EXTRACTION Right   . DENTAL SURGERY    . DILATION AND CURETTAGE OF UTERUS    . fibroid tumor removal    . GLAUCOMA SURGERY     Right eye  .  MYOMECTOMY       A IV Location/Drains/Wounds Patient Lines/Drains/Airways Status   Active Line/Drains/Airways    Name:   Placement date:   Placement time:   Site:   Days:   Peripheral IV 06/24/18 Right Antecubital   06/24/18    0233    Antecubital   less than 1   Peripheral IV 06/24/18 Left;Upper Arm   06/24/18    0425    Arm   less than 1   CVC Triple Lumen 06/24/18 Left Subclavian   06/24/18    0555     less than 1   NG/OG Tube Orogastric 16 Fr. Center mouth Aucultation Measured external length of tube   06/24/18    0324    Center mouth   less than 1   Urethral Catheter Ben, RN Temperature probe 14 Fr.   06/24/18    0340    Temperature probe   less  than 1   Airway 7.5 mm   06/24/18    0321     less than 1          Intake/Output Last 24 hours  Intake/Output Summary (Last 24 hours) at 06/24/2018 1550 Last data filed at 06/24/2018 1236 Gross per 24 hour  Intake 50 ml  Output 1600 ml  Net -1550 ml    Labs/Imaging Results for orders placed or performed during the hospital encounter of 06/24/18 (from the past 48 hour(s))  CBC with Differential     Status: Abnormal   Collection Time: 06/24/18  2:33 AM  Result Value Ref Range   WBC 9.3 4.0 - 10.5 K/uL    Comment: WHITE COUNT CONFIRMED ON SMEAR   RBC 3.70 (L) 3.87 - 5.11 MIL/uL   Hemoglobin 11.3 (L) 12.0 - 15.0 g/dL   HCT 37.0 36.0 - 46.0 %   MCV 100.0 80.0 - 100.0 fL   MCH 30.5 26.0 - 34.0 pg   MCHC 30.5 30.0 - 36.0 g/dL   RDW 16.6 (H) 11.5 - 15.5 %   Platelets PLATELET CLUMPS NOTED ON SMEAR, UNABLE TO ESTIMATE 150 - 400 K/uL   nRBC 0.0 0.0 - 0.2 %   Neutrophils Relative % 47 %   Neutro Abs 4.3 1.7 - 7.7 K/uL   Lymphocytes Relative 39 %   Lymphs Abs 3.6 0.7 - 4.0 K/uL   Monocytes Relative 8 %   Monocytes Absolute 0.8 0.1 - 1.0 K/uL   Eosinophils Relative 3 %   Eosinophils Absolute 0.3 0.0 - 0.5 K/uL   Basophils Relative 1 %   Basophils Absolute 0.1 0.0 - 0.1 K/uL   Immature Granulocytes 2 %   Abs Immature Granulocytes 0.17 (H) 0.00 - 0.07 K/uL    Comment: Performed at [facility], [address] [place], [place] [address]  Comprehensive metabolic panel     Status: Abnormal   Collection Time: 06/24/18  2:33 AM  Result Value Ref Range   Sodium 138 135 - 145 mmol/L   Potassium 2.7 (LL) 3.5 - 5.1 mmol/L    Comment: CRITICAL RESULT CALLED TO, READ BACK BY AND VERIFIED WITH: B.BAILIFF,RN 0416 06/24/18 G.MCADOO    Chloride 108 98 - 111 mmol/L   CO2 19 (L) 22 - 32 mmol/L   Glucose, Bld 201 (H) 70 - 99 mg/dL   BUN 14 6 - 20 mg/dL   Creatinine, Ser 1.04 (H) 0.44 - 1.00 mg/dL   Calcium 7.5 (L) 8.9 - 10.3 mg/dL   Total Protein 5.7 (L) 6.5 -  8.1 g/dL   Albumin 2.6  (L) 3.5 - 5.0 g/dL   AST 46 (H) 15 - 41 U/L   ALT 34 0 - 44 U/L   Alkaline Phosphatase 94 38 - 126 U/L   Total Bilirubin 1.3 (H) 0.3 - 1.2 mg/dL   GFR calc non Af Amer 59 (L) >60 mL/min   GFR calc Af Amer >60 >60 mL/min   Anion gap 11 5 - 15    Comment: Performed at [facility] [address], [place], [place] [address]  Brain natriuretic peptide     Status: Abnormal   Collection Time: 06/24/18  2:33 AM  Result Value Ref Range   B Natriuretic Peptide 1,614.4 (H) 0.0 - 100.0 pg/mL    Comment: Performed at [facility] [address], [place], [place] [address]  Troponin I - ONCE - STAT     Status: Abnormal   Collection Time: 06/24/18  2:33 AM  Result Value Ref Range   Troponin I 0.03 (HH) <0.03 ng/mL    Comment: CRITICAL RESULT CALLED TO, READ BACK BY AND VERIFIED WITH: B.BAILIFF,RN 0416 06/24/18 G.MCADOO Performed at [facility], [place] [address] [place], [place] [address]   SARS Coronavirus 2     Status: None   Collection Time: 06/24/18  2:47 AM  Result Value Ref Range   SARS Coronavirus 2 NOT DETECTED NOT DETECTED    Comment: (NOTE) SARS-CoV-2 target nucleic acids are NOT DETECTED. The SARS-CoV-2 RNA is generally detectable in upper and lower respiratory specimens during the acute phase of infection.  Negative  results do not preclude SARS-CoV-2 infection, do not rule out co-infections with other pathogens, and should not be used as the sole basis for treatment or other patient management decisions.  Negative results must be combined with clinical observations, patient history, and epidemiological information. The expected result is Not Detected. Fact Sheet for Patients: http://www.biofiredefense.com/wp-content/uploads/2020/03/BIOFIRE-COVID -19-patients.pdf Fact Sheet for Healthcare Providers: http://www.biofiredefense.com/wp-content/uploads/2020/03/BIOFIRE-COVID -19-hcp.pdf This test is not yet approved or cleared by the [place] and  has  been authorized for detection and/or diagnosis of SARS-CoV-2 by FDA under an Emergency Use Authorization (EUA).  This EUA will remain in effec t (meaning this test can be used) for the duration of  the COVID-19 declaration under Section 564(b)(1) of the Act, 21 U.S.C. section 360bbb-3(b)(1), unless the authorization is terminated or revoked sooner. Performed at [facility], [place] [address], [place], [place] [address]   Blood gas, arterial     Status: Abnormal   Collection Time: 06/24/18  2:55 AM  Result Value Ref Range   FIO2 100.00    Delivery systems NON-REBREATHER OXYGEN MASK    pH, Arterial 6.989 (LL) 7.350 - 7.450    Comment: CRITICAL RESULT CALLED TO, READ BACK BY AND VERIFIED WITH: VICTOR TAYLOR RRT AT 0306 BY JESSICA HALEY RRT RCP ON 06/24/2018    pCO2 arterial 105 (HH) 32.0 - 48.0 mmHg    Comment: CRITICAL RESULT CALLED TO, READ BACK BY AND VERIFIED WITH: VICTOR TAYLOR RRT AT [place] RRT RCP ON 06/24/2018    pO2, Arterial 293 (H) 83.0 - 108.0 mmHg   Bicarbonate 24.0 20.0 - 28.0 mmol/L   Acid-base deficit 6.4 (H) 0.0 - 2.0 mmol/L   O2 Saturation 98.5 %   Patient temperature 98.6    Collection site RIGHT RADIAL    Drawn by [phone]    Sample type ARTERIAL DRAW    Allens test (pass/fail) PASS  PASS  I-STAT 7, (LYTES, BLD GAS, ICA, H+H)     Status: Abnormal   Collection Time: 06/24/18  4:38 AM  Result Value Ref Range   pH, Arterial 7.346 (L) 7.350 - 7.450   pCO2 arterial 41.3 32.0 - 48.0 mmHg   pO2, Arterial 260.0 (H) 83.0 - 108.0 mmHg   Bicarbonate 22.8 20.0 - 28.0 mmol/L   TCO2 24 22 - 32 mmol/L   O2 Saturation 100.0 %   Acid-base deficit 3.0 (H) 0.0 - 2.0 mmol/L   Sodium 141 135 - 145 mmol/L   Potassium 3.5 3.5 - 5.1 mmol/L   Calcium, Ion 1.26 1.15 - 1.40 mmol/L   HCT 35.0 (L) 36.0 - 46.0 %   Hemoglobin 11.9 (L) 12.0 - 15.0 g/dL   Patient temperature 97.0 F    Collection site RADIAL, ALLEN'S TEST ACCEPTABLE    Drawn by RT    Sample type ARTERIAL    Strep pneumoniae urinary antigen  (not at [facility])     Status: None   Collection Time: 06/24/18  5:09 AM  Result Value Ref Range   Strep Pneumo Urinary Antigen NEGATIVE NEGATIVE    Comment:        Infection due to S. pneumoniae cannot be absolutely ruled out since the antigen present may be below the detection limit of the test. Performed at [facility], [address], [place], [place] [address]   HIV antibody (Routine Testing)     Status: None   Collection Time: 06/24/18  5:24 AM  Result Value Ref Range   HIV Screen 4th Generation wRfx Non Reactive Non Reactive    Comment: (NOTE) Performed At: [facility] [place], [place] [id number] Rush Farmer MD AT:[id number]   Magnesium     Status: None   Collection Time: 06/24/18  5:24 AM  Result Value Ref Range   Magnesium 2.2 1.7 - 2.4 mg/dL    Comment: Performed at [facility], [place] [address] [place], [place] [address]  Phosphorus     Status: Abnormal   Collection Time: 06/24/18  5:24 AM  Result Value Ref Range   Phosphorus 4.8 (H) 2.5 - 4.6 mg/dL    Comment: Performed at [facility] [address] [place], [place] [address]  Procalcitonin     Status: None   Collection Time: 06/24/18  5:24 AM  Result Value Ref Range   Procalcitonin 0.47 ng/mL    Comment:        Interpretation: PCT (Procalcitonin) <= 0.5 ng/mL: Systemic infection (sepsis) is not likely. Local bacterial infection is possible. (NOTE)       Sepsis PCT Algorithm           Lower Respiratory Tract                                      Infection PCT Algorithm    ----------------------------     ----------------------------         PCT < 0.25 ng/mL                PCT < 0.10 ng/mL         Strongly encourage             Strongly discourage   discontinuation of antibiotics    initiation of antibiotics    ----------------------------     -----------------------------  PCT 0.25 - 0.50 ng/mL            PCT 0.10 - 0.25  ng/mL               OR       >80% decrease in PCT            Discourage initiation of                                            antibiotics      Encourage discontinuation           of antibiotics    ----------------------------     -----------------------------         PCT >= 0.50 ng/mL              PCT 0.26 - 0.50 ng/mL               AND        <80% decrease in PCT             Encourage initiation of                                             antibiotics       Encourage continuation           of antibiotics    ----------------------------     -----------------------------        PCT >= 0.50 ng/mL                  PCT > 0.50 ng/mL               AND         increase in PCT                  Strongly encourage                                      initiation of antibiotics    Strongly encourage escalation           of antibiotics                                     -----------------------------                                           PCT <= 0.25 ng/mL                                                 OR                                        > 80% decrease in PCT  Discontinue / Do not initiate                                             antibiotics Performed at [facility], [place] [address] [place], [place] [address]   Respiratory Panel by PCR     Status: None   Collection Time: 06/24/18  5:39 AM  Result Value Ref Range   Adenovirus NOT DETECTED NOT DETECTED   Coronavirus 229E NOT DETECTED NOT DETECTED    Comment: (NOTE) The Coronavirus on the Respiratory Panel, DOES NOT test for the novel  Coronavirus (2019 nCoV)    Coronavirus HKU1 NOT DETECTED NOT DETECTED   Coronavirus NL63 NOT DETECTED NOT DETECTED   Coronavirus OC43 NOT DETECTED NOT DETECTED   Metapneumovirus NOT DETECTED NOT DETECTED   Rhinovirus / Enterovirus NOT DETECTED NOT DETECTED   Influenza A NOT DETECTED NOT DETECTED   Influenza B NOT DETECTED NOT DETECTED    Parainfluenza Virus 1 NOT DETECTED NOT DETECTED   Parainfluenza Virus 2 NOT DETECTED NOT DETECTED   Parainfluenza Virus 3 NOT DETECTED NOT DETECTED   Parainfluenza Virus 4 NOT DETECTED NOT DETECTED   Respiratory Syncytial Virus NOT DETECTED NOT DETECTED   Bordetella pertussis NOT DETECTED NOT DETECTED   Chlamydophila pneumoniae NOT DETECTED NOT DETECTED   Mycoplasma pneumoniae NOT DETECTED NOT DETECTED    Comment: Performed at [facility], [place] [address], [place], [place] [address]  Troponin I - Now Then Q6H     Status: Abnormal   Collection Time: 06/24/18  8:04 AM  Result Value Ref Range   Troponin I 0.04 (HH) <0.03 ng/mL    Comment: CRITICAL RESULT CALLED TO, READ BACK BY AND VERIFIED WITH: KAREN COBB,RN AT 6256 06/24/2018 BY ZBEECH. Performed at [facility], [place] [address], [place], [place] [address]   Lactic acid, plasma     Status: Abnormal   Collection Time: 06/24/18  8:04 AM  Result Value Ref Range   Lactic Acid, Venous 2.2 (HH) 0.5 - 1.9 mmol/L    Comment: CRITICAL RESULT CALLED TO, READ BACK BY AND VERIFIED WITH: KAREN COBB,RN AT 3428 06/24/2018 BY ZBEECH Performed at [facility], [place] [address], [place], [place] [address]   .Cooxemetry Panel (carboxy, met, total hgb, O2 sat)     Status: None   Collection Time: 06/24/18  8:15 AM  Result Value Ref Range   Total hemoglobin 12.6 12.0 - 16.0 g/dL   O2 Saturation 66.4 %   Carboxyhemoglobin 1.4 0.5 - 1.5 %   Methemoglobin 0.8 0.0 - 1.5 %  CBG monitoring, ED     Status: Abnormal   Collection Time: 06/24/18  8:34 AM  Result Value Ref Range   Glucose-Capillary 153 (H) 70 - 99 mg/dL  Digoxin level     Status: Abnormal   Collection Time: 06/24/18  8:59 AM  Result Value Ref Range   Digoxin Level <0.2 (L) 0.8 - 2.0 ng/mL    Comment: RESULTS CONFIRMED BY MANUAL DILUTION Performed at [facility], [place] [address] [place], [place] [address]   I-STAT 7, (LYTES, BLD GAS, ICA, H+H)     Status: Abnormal    Collection Time: 06/24/18 10:30 AM  Result Value Ref Range   pH, Arterial 7.431 7.350 - 7.450   pCO2 arterial 34.2 32.0 - 48.0 mmHg   pO2, Arterial  229.0 (H) 83.0 - 108.0 mmHg   Bicarbonate 22.9 20.0 - 28.0 mmol/L   TCO2 24 22 - 32 mmol/L   O2 Saturation 100.0 %   Acid-base deficit 1.0 0.0 - 2.0 mmol/L   Sodium 143 135 - 145 mmol/L   Potassium 3.1 (L) 3.5 - 5.1 mmol/L   Calcium, Ion 1.16 1.15 - 1.40 mmol/L   HCT 38.0 36.0 - 46.0 %   Hemoglobin 12.9 12.0 - 15.0 g/dL   Patient temperature 96.9 F    Collection site RADIAL, ALLEN'S TEST ACCEPTABLE    Drawn by RT    Sample type ARTERIAL   CBG monitoring, ED     Status: Abnormal   Collection Time: 06/24/18 12:12 PM  Result Value Ref Range   Glucose-Capillary 147 (H) 70 - 99 mg/dL   Comment 1 Notify RN    Comment 2 Document in Chart    Ct Chest Wo Contrast  Result Date: 06/24/2018 CLINICAL DATA:  Possible acute on chronic infiltrate EXAM: CT CHEST WITHOUT CONTRAST TECHNIQUE: Multidetector CT imaging of the chest was performed following the standard protocol without IV contrast. COMPARISON:  12/16/2016, plain film from earlier in the same day. FINDINGS: Cardiovascular: Atherosclerotic calcifications are identified. No significant cardiac enlargement is noted. No coronary calcifications are seen. Mediastinum/Nodes: Endotracheal tube and gastric catheter are noted in satisfactory position. Lack of IV contrast limits evaluation for adenopathy. Left subclavian central venous line is noted at the confluence of the innominate veins. Esophagus as visualized is within normal limits. Thoracic inlet is unremarkable. Lungs/Pleura: Central perihilar scarring is again identified consistent with the given clinical history of sarcoidosis. Some patchy reticulonodular infiltrate is noted particularly within the right upper lung which may be related to progression of sarcoid although the possibility of acute infiltrate deserves consideration as well. Additionally  there is some ground-glass density in the bases bilaterally consistent with atypical pneumonia. No sizable effusion is noted. Or marked nodularity is noted in the lower lobe measuring up to 8 mm. These are likely postop in inflammatory in nature. Upper Abdomen: Small nonobstructing renal stones are noted bilaterally. The upper abdomen is otherwise within normal limits. Musculoskeletal: No chest wall mass or suspicious bone lesions identified. IMPRESSION: Chronic changes consistent with sarcoidosis. Increase in reticulonodular infiltrate particularly in the right lung which may represent some interval progression of sarcoidosis. Ground-glass infiltrate in the lower lobes bilaterally right greater than left with associated nodularity consistent with atypical pneumonia. No sizable effusion is noted. Electronically Signed   By: Inez Catalina M.D.   On: 06/24/2018 07:29   Dg Chest Port 1 View  Result Date: 06/24/2018 CLINICAL DATA:  Central line repositioning EXAM: PORTABLE CHEST 1 VIEW COMPARISON:  Film from earlier in the same day. FINDINGS: Cardiac shadow is stable. Endotracheal tube and gastric catheter are again noted and stable. Bilateral infiltrates are again seen right greater than left. The left subclavian catheter has been withdrawn and the tip now lies at the confluence of the innominate veins. Again no pneumothorax is noted. IMPRESSION: No pneumothorax. Left subclavian central line is been reposition as described. Electronically Signed   By: Inez Catalina M.D.   On: 06/24/2018 07:00   Dg Chest Portable 1 View  Result Date: 06/24/2018 CLINICAL DATA:  Central line placement EXAM: PORTABLE CHEST 1 VIEW COMPARISON:  Film from earlier in the same day. FINDINGS: Cardiac shadow is stable. Endotracheal tube and gastric catheter are noted in satisfactory position. New left subclavian central line is noted with the catheter tip  in the right innominate vein. No pneumothorax is noted. Patchy infiltrates are noted  bilaterally right greater than left similar to that seen on the prior exam. IMPRESSION: No pneumothorax following central line placement. Left subclavian catheter tip is noted within the right innominate vein. Bilateral infiltrates right greater than left Electronically Signed   By: Inez Catalina M.D.   On: 06/24/2018 06:59   Dg Chest Portable 1 View  Result Date: 06/24/2018 CLINICAL DATA:  Post intubation. EXAM: PORTABLE CHEST 1 VIEW COMPARISON:  06/24/2018 FINDINGS: The endotracheal tube terminates in the right mainstem bronchus. The enteric tube terminates below the left hemidiaphragm. Again identified are diffuse bilateral airspace opacities similar to prior study. There is no pneumothorax, however the right costophrenic angle is not visualized. There is no acute osseous abnormality. IMPRESSION: 1. Endotracheal tube terminates in the right mainstem bronchus. Repositioning is recommended. 2. OG tube terminates below the left hemidiaphragm. The tip is not visualized on this exam. 3. Stable cardiac enlargement. 4. Persistent diffuse bilateral airspace opacities similar to prior study. Endotracheal tube positioning was discussed with RN Baliff by Dr. Nyoka Cowden at approximately 357 a.m. on 06/24/2018. Electronically Signed   By: Constance Holster M.D.   On: 06/24/2018 03:59   Dg Chest Portable 1 View  Result Date: 06/24/2018 CLINICAL DATA:  Respiratory distress. EXAM: PORTABLE CHEST 1 VIEW COMPARISON:  12/17/2016 FINDINGS: Heart size is enlarged. There are diffuse bilateral hazy airspace opacities. There are prominent interstitial lung markings. There is a small right-sided pleural effusion. There may be a trace left-sided pleural effusion. There is no pneumothorax. No displaced fracture. IMPRESSION: 1. Cardiomegaly. 2. Worsening bilateral multifocal airspace opacities may represent progressive sarcoidosis, pulmonary edema, or an atypical infectious process. 3. There is a small right-sided pleural effusion and  likely a trace left-sided pleural effusion. Electronically Signed   By: Constance Holster M.D.   On: 06/24/2018 03:14    Pending Labs Unresulted Labs (From admission, onward)    Start     Ordered   06/25/18 0500  CBC  Tomorrow morning,   R     06/24/18 0510   06/25/18 3845  Basic metabolic panel  Tomorrow morning,   R     06/24/18 0510   06/25/18 0500  Blood gas, arterial  Tomorrow morning,   R     06/24/18 0510   06/25/18 0500  Magnesium  Tomorrow morning,   R     06/24/18 0510   06/25/18 0500  Phosphorus  Tomorrow morning,   R     06/24/18 0510   06/24/18 1000  Blood gas, arterial  Once,   R     06/24/18 0559   06/24/18 0900  Troponin I - Now Then Q6H  Now then every 6 hours,   R     06/24/18 0510   06/24/18 0517  Blood culture (routine x 2)  BLOOD CULTURE X 2,   STAT     06/24/18 0518   06/24/18 0509  Legionella Pneumophila Serogp 1 Ur Ag  Once,   R     06/24/18 0510   06/24/18 0509  Culture, respiratory (tracheal aspirate)  Once,   R     06/24/18 0510   06/24/18 0509  Culture, blood (routine x 2)  BLOOD CULTURE X 2,   R     06/24/18 0510          Vitals/Pain Today's Vitals   06/24/18 1350 06/24/18 1355 06/24/18 1400 06/24/18 1410  BP: (!) 125/96 (!) 122/98 [name])  118/99 (!) 122/98  Pulse: 87 84 89 87  Resp: (!) 23 (!) 23 (!) 23 (!) 23  Temp: 98.8 F (37.1 C) 98.8 F (37.1 C) 98.8 F (37.1 C) 98.9 F (37.2 C)  TempSrc:      SpO2: 100% 100% 100% 100%  Weight:      Height:      PainSc:        Isolation Precautions Droplet precaution  Medications Medications  propofol (DIPRIVAN) 1000 MG/100ML infusion (0 mcg/kg/min  54.9 kg Intravenous Stopped 06/24/18 0449)  famotidine (PEPCID) IVPB 20 mg premix (0 mg Intravenous Stopped 06/24/18 1112)  chlorhexidine gluconate (MEDLINE KIT) (PERIDEX) 0.12 % solution 15 mL (15 mLs Mouth Rinse Not Given 06/24/18 1204)  MEDLINE mouth rinse (15 mLs Mouth Rinse Not Given 06/24/18 0934)  ipratropium-albuterol (DUONEB) 0.5-2.5 (3)  MG/3ML nebulizer solution 3 mL (3 mLs Nebulization Given 06/24/18 1157)  bisacodyl (DULCOLAX) suppository 10 mg (has no administration in time range)  docusate (COLACE) 50 MG/5ML liquid 100 mg (has no administration in time range)  fentaNYL (SUBLIMAZE) injection 50 mcg (has no administration in time range)  fentaNYL (SUBLIMAZE) injection 50-200 mcg (has no administration in time range)  norepinephrine (LEVOPHED) 73m in 2575mpremix infusion (2 mcg/min Intravenous Rate/Dose Change 06/24/18 1039)  methylPREDNISolone sodium succinate (SOLU-MEDROL) 40 mg/mL injection 40 mg (40 mg Intravenous Given 06/24/18 1224)  enoxaparin (LOVENOX) injection 40 mg (40 mg Subcutaneous Given 06/24/18 1117)  insulin aspart (novoLOG) injection 0-9 Units (1 Units Subcutaneous Given 06/24/18 1224)  dexmedetomidine (PRECEDEX) 200 MCG/50ML (4 mcg/mL) infusion (0.4 mcg/kg/hr  54.9 kg Intravenous New Bag/Given 06/24/18 1205)  furosemide (LASIX) injection 40 mg (40 mg Intravenous Given 06/24/18 0248)  LORazepam (ATIVAN) injection 0.5 mg (0.5 mg Intravenous Given 06/24/18 0249)  etomidate (AMIDATE) injection (20 mg Intravenous Given 06/24/18 0320)  succinylcholine (ANECTINE) injection (8,235 mg Intravenous Given 06/24/18 0322)  midazolam (VERSED) injection 2 mg (2 mg Intravenous Given 06/24/18 0420)  vancomycin (VANCOCIN) IVPB 1000 mg/200 mL premix (0 mg Intravenous Stopped 06/24/18 0636)  piperacillin-tazobactam (ZOSYN) IVPB 3.375 g (0 g Intravenous Stopped 06/24/18 0746)  potassium chloride 20 MEQ/15ML (10%) solution 40 mEq (40 mEq Per Tube Given 06/24/18 0806)    Mobility walks Moderate fall risk   Focused Assessments Pulmonary Assessment Handoff:  Lung sounds: Bilateral Breath Sounds: Rhonchi, Expiratory wheezes L Breath Sounds: Rhonchi R Breath Sounds: Rhonchi O2 Device: Ventilator        R Recommendations: See Admitting Provider Note  Report given to:   Additional Notes: Has a fanny pack at bedside. Sister wants  updates. Info in chart.

## 2018-06-24 NOTE — Progress Notes (Signed)
  Echocardiogram 2D Echocardiogram has been performed.  [name] 06/24/2018, 2:04 PM

## 2018-06-24 NOTE — ED Notes (Signed)
Pt shaking and nodding head to yes/no questions. Denies pain at this time. Pt not attempting to remove any lines or tubes. Pt in no apparent distress at this time.   OG tube hooked back up to low intermittent suction.

## 2018-06-24 NOTE — ED Notes (Signed)
Sister's phone: [phone] Vaughan Basta Flowers)

## 2018-06-24 NOTE — ED Notes (Signed)
Fentanyl order was discontinued. Remainder of fentanyl drip wasted with off going RN, [employer]. Wasted into stericycle.

## 2018-06-24 NOTE — H&P (Addendum)
NAME:  [name], MRN:  [id number], DOB:  1960/07/21, LOS: 0 ADMISSION DATE:  06/24/2018, CONSULTATION DATE:  06/24/2018 REFERRING MD:  Dr. Stark Jock, CHIEF COMPLAINT:  SOB/ leg swelling  Brief History   58 year old female with history of pulmonary sarcoidosis and asthma, recently treated for pneumonia with Zithromax and lasix for leg swelling presenting with severe respiratory distress and altered mental status requiring intubation.    History of present illness   HPI obtained from medical chart review as patient is intubated and sedated on mechanical ventilation.   58 year old female with history of Pulmonary sarcoidosis, asthma, atrial flutter on eliquis, HTN, anxiety, AAA, and PUD presenting to ER with severe respiratory distress.   She was seen at urgent care on 6/5 with two day history of SOB.  Declined steroids; COVID was negative.  CXR showed pulmonary insterstitial edema and likely right upper lobe pneumonia sent home on azithromax and albuterol.  Was seen in her primary office 6/9 with ongoing complaints of SOB and leg swelling sent home with several doses of lasix.   She is followed by [facility] and Dr. Lake Bells locally.    In ER, she was in severe respiratory distress, treated with duoneb, solumedrol, epi IM, and magnesium by EMS.  She was afebrile with blood pressure of 162/106, and 100% on NRB with some altered mental status.  Initially given ativan, lasix and NRB in the ER in attempts to hold off intubation, however she became more obtunded and required intubation with ABG of PH 6.989, pCO2 10, pO2 293.  Labs also noted for K 2.7, sCr 1.04, BNP 1614, troponin 0.03, WBC 9.3.  COVID negative.  CXR showed worsening bilateral multifocal opacities.  EKG showed afib with nonspecific lateral [place] abnormalities. She was placed on propofol for sedation however had post-intubation  Hypotension and therefore switched to fentanyl drip and given 1L bolus, currently finishing with some mild  improvement in blood pressure.   PCCM called for admission.   Past Medical History  Never smoker, Pulmonary sarcoidosis, asthma, atrial flutter on eliquis, HTN, anxiety, AAA, PUD  Significant Hospital Events   6/10 Admitted  Consults:   Procedures:  6/10 ETT >>  Significant Diagnostic Tests:  6/10 CXR >> 1. Cardiomegaly. 2. Worsening bilateral multifocal airspace opacities may represent progressive sarcoidosis, pulmonary edema, or an atypical infectious process. 3. There is a small right-sided pleural effusion and likely a trace left-sided pleural effusion.  Micro Data:  6/10 SARS coronavirus 2>> 6/10 BCx 2 >> 6/10 trach asp >> 6/10 RVP >>  6/10 urine legionella >>         Urine strep >>  Antimicrobials:  6/5 azithro  6/10 zosyn  >> 6/10 vanc >>  Interim history/subjective:  Currently on fentanyl 50 mcg/hr drip Finishing 1L NS bolus   Objective   Blood pressure 90/66, pulse 88, temperature (!) 96.9 F (36.1 C), resp. rate (!) 24, height 5\' 4"  (1.626 m), weight 54.9 kg, SpO2 100 %.    Vent Mode: PRVC FiO2 (%):  [100 %] 100 % Set Rate:  [24 bmp] 24 bmp Vt Set:  [400 mL] 400 mL PEEP:  [5 cmH20] 5 cmH20  No intake or output data in the 24 hours ending 06/24/18 0539 Filed Weights   06/24/18 0232  Weight: 54.9 kg   Examination: General:  Thin AA female sedated on MV  HEENT: MM pink/moist, ETT at 20 cm at lip, OGT, pupils 3/reactive, anicteric, +JVD Neuro: sedated, not f/c, spont  moving extremities CV: IRIR, no murmur PULM: MV breaths, not breathing over set rate, mostly clear breath sounds throughout, slightly diminished in left base, PIP 22, no air trapping currently, no wheeze appreciated GI: soft, NT/ND, bs active  Extremities: warm/dry, 2-3+ edema below the knees  Skin: no rashes  Resolved Hospital Problem list    Assessment & Plan:   Acute hypoxic/ hypercarbic respiratory failure- ddx could multifactorial w/ persistent multifocal opacities which  could represent worsening sarcoidosis, pulmonary edema, or infectious process.  Hx of pulmonary sarcoidosis, asthma, never smoker P:  Full MV support, PRVC 8 cc/kg, rate 24 Wean FiO2/ PEEP for goal sat 92-99% VAP bundle Recheck ABG at 1000  Would have liked HRCT given last CT was in 12/2016 which showed progression of disease, however not advised in acute illness by radiologist therefore will just obtain CT chest  duonebs q 4 and albuterol PRN  Solumedrol 40 mg q 6 for now Will check PCT, send for RVP, trach asp, urine legionella and strep Empiric vanc /zosyn for now S/p lasix 40 mg in ER PAD protocol with fentanyl gtt, ideally change to precedex and switch to prn fentanyl for RASS goal 0/-1 / vent synchrony    Hypotension- post intubation could be related to intubation, possibly some component of RV failure/ PAH vs sedation vs sepsis  Hx HTN - prior TTE in 12/2016 noted for normal LV function, elevated RV pressures and PAP 48 mmHgb P:  Tele monitoring Will place CVL Finishing 1L bolus Will add levophed as needed for goal MAP > 65 Hold any further diuresis at this time given hypotension Check coox  Trend lactate  TTE today  Trend troponin and EKG Hold norvasc, losartan, and digoxin, assess dig level  Afib on Eliquis  P:  Rate currently controlled Hold Eliquis  Hypokalemia AKI - mild P:  Continue foley  KCL 40 meq x 1 S/p mag 2 gm w/EMS Trend BMP / Mag / phos/ urinary output Replace electrolytes as indicated Avoid nephrotoxic agents, ensure adequate renal perfusion  Hyperglycemia s/p steroids P:  CBG q 4 SSI sensitive   Best practice:  Diet: NPO Pain/Anxiety/Delirium protocol (if indicated): precedex prn fentanyl VAP protocol (if indicated): yes DVT prophylaxis: lovenox GI prophylaxis: pepcid Glucose control: SSI sensitive Mobility: BR Code Status: Full  Family Communication: Pershing Cox, [phone] updated by phone.  Not married, no kids.   Disposition: ICU  Labs   CBC: Recent Labs  Lab 06/24/18 0233 06/24/18 0438  WBC 9.3  --   NEUTROABS 4.3  --   HGB 11.3* 11.9*  HCT 37.0 35.0*  MCV 100.0  --   PLT PLATELET CLUMPS NOTED ON SMEAR, UNABLE TO ESTIMATE  --     Basic Metabolic Panel: Recent Labs  Lab 06/24/18 0233 06/24/18 0438  NA 138 141  K 2.7* 3.5  CL 108  --   CO2 19*  --   GLUCOSE 201*  --   BUN 14  --   CREATININE 1.04*  --   CALCIUM 7.5*  --    GFR: Estimated Creatinine Clearance: 50.9 mL/min (A) (by C-G formula based on SCr of 1.04 mg/dL (H)). Recent Labs  Lab 06/24/18 0233  WBC 9.3    Liver Function Tests: Recent Labs  Lab 06/24/18 0233  AST 46*  ALT 34  ALKPHOS 94  BILITOT 1.3*  PROT 5.7*  ALBUMIN 2.6*   No results for input(s): LIPASE, AMYLASE in the last 168 hours. No results for input(s): AMMONIA in  the last 168 hours.  ABG    Component Value Date/Time   PHART 7.346 (L) 06/24/2018 0438   PCO2ART 41.3 06/24/2018 0438   PO2ART 260.0 (H) 06/24/2018 0438   HCO3 22.8 06/24/2018 0438   TCO2 24 06/24/2018 0438   ACIDBASEDEF 3.0 (H) 06/24/2018 0438   O2SAT 100.0 06/24/2018 0438     Coagulation Profile: No results for input(s): INR, PROTIME in the last 168 hours.  Cardiac Enzymes: Recent Labs  Lab 06/24/18 0233  TROPONINI 0.03*    HbA1C: Hgb A1c MFr Bld  Date/Time Value Ref Range Status  05/18/2013 07:44 AM 5.9 4.6 - 6.5 % Final    Comment:    Glycemic Control Guidelines for People with Diabetes:Non Diabetic:  <6%Goal of Therapy: <7%Additional Action Suggested:  >8%   07/24/2012 08:21 AM 6.2 4.6 - 6.5 % Final    Comment:    Glycemic Control Guidelines for People with Diabetes:Non Diabetic:  <6%Goal of Therapy: <7%Additional Action Suggested:  >8%     CBG: No results for input(s): GLUCAP in the last 168 hours.  Review of Systems:   Unable  Past Medical History  She,  has a past medical history of AAA (abdominal aortic aneurysm) ([place]), Allergy, Anxiety, Asthma,  Atrial flutter ([place]), Cataract, Depression, Glaucoma, Heart murmur, Herpes simplex, Hypertension, Multiple thyroid nodules, Osteoarthritis, Osteoporosis, PUD (peptic ulcer disease), Pulmonary sarcoidosis ([place]), Tubular adenoma of colon (07/2015), and Vitamin D deficiency.   Surgical History    Past Surgical History:  Procedure Laterality Date  . BUNIONECTOMY     x2  . CATARACT EXTRACTION Right   . DENTAL SURGERY    . DILATION AND CURETTAGE OF UTERUS    . fibroid tumor removal    . GLAUCOMA SURGERY     Right eye  . MYOMECTOMY       Social History   reports that she has never smoked. She has never used smokeless tobacco. She reports that she does not drink alcohol or use drugs.   Family History   Her family history includes Heart Problems in her father; Hypertension in her father; Lung cancer in her mother; Stomach cancer in her cousin; Stroke in her father. There is no history of Colon cancer, Colon polyps, Esophageal cancer, or Rectal cancer.   Allergies Allergies  Allergen Reactions  . Bystolic [Nebivolol Hcl] Shortness Of Breath  . Doxycycline Shortness Of Breath  . Amoxicillin-Pot Clavulanate     REACTION: GI symptoms  . Amoxicillin-Pot Clavulanate Other (See Comments)    REACTION: GI symptoms  . Cardizem [Diltiazem Hcl] Other (See Comments)    HAs Pt can take Norvasc HAs Pt can take Norvasc  . Carvedilol Other (See Comments)    REACTION: tired REACTION: tired  . Cefdinir Diarrhea  . Cephalexin Diarrhea    REACTION: unspecified  . Hydrochlorothiazide Other (See Comments)    Dropped BP and caused hair loss   . Influenza Vaccines Other (See Comments)    Allergic to flu vaccinations per pt.  Soreness and "cold symptoms" per pt Allergic to flu vaccinations per pt.  Soreness and "cold symptoms" per pt  . Pneumovax [Pneumococcal Polysaccharide Vaccine]     ?reaction  . Prednisone Hypertension and Other (See Comments)  . Sulfamethoxazole     Other reaction(s): Unknown  REACTION: unspecified REACTION: unspecified  . Sulfasalazine     Other reaction(s): Unknown  . Sulfonamide Derivatives   . Verapamil Other (See Comments)    fatigue fatigue     Home Medications  Prior  to Admission medications   Medication Sig Start Date End Date Taking? Authorizing Provider  acyclovir (ZOVIRAX) 200 MG capsule TAKE 1-2 CAPSULES (200-400 MG TOTAL) BY MOUTH DAILY. 10/14/17   Plotnikov, Evie Lacks, MD  acyclovir cream (ZOVIRAX) 5 % Apply 1 application topically every 3 (three) hours. 01/07/18   Plotnikov, Evie Lacks, MD  amLODipine (NORVASC) 5 MG tablet Take 0.5 tablets (2.5 mg total) by mouth daily. 01/02/18   Plotnikov, Evie Lacks, MD  apixaban (ELIQUIS) 5 MG TABS tablet Take 1 tablet (5 mg total) by mouth every 12 (twelve) hours. Patient taking differently: Take 2.5 mg by mouth every 12 (twelve) hours.  09/01/17   Plotnikov, Evie Lacks, MD  digoxin (LANOXIN) 0.125 MG tablet TAKE 1 TABLET (0.125 MG TOTAL) BY MOUTH DAILY. 12/04/17   Plotnikov, Evie Lacks, MD  Dorzolamide HCl-Timolol Mal (COSOPT OP) Apply 1 drop to eye as needed.    [provider]  hydroxypropyl methylcellulose / hypromellose (ISOPTO TEARS / GONIOVISC) 2.5 % ophthalmic solution Place 1 drop into the right eye daily.    [provider]  KLOR-CON/EF 25 MEQ disintegrating tablet TAKE 1 TABLET AND ALLOW TO DISSOLVE IN 4 OZ OF COLD WATER BEFORE DRINKING 2 TIMES DAILY 12/15/17   Plotnikov, Evie Lacks, MD  loratadine (CLARITIN) 10 MG tablet Take 10 mg by mouth daily.    [provider]  losartan (COZAAR) 100 MG tablet TAKE 1/2 TABLET BY MOUTH DAILY 01/20/18   Plotnikov, Evie Lacks, MD  Multiple Vitamins-Minerals (HAIR SKIN & NAILS ADVANCED PO) Take 2 capsules by mouth daily.    [provider]  vitamin B-12 (CYANOCOBALAMIN) 100 MCG tablet Take 100 mcg by mouth daily. Reported on 06/20/2015    [provider]     Critical care time: 50 mins     Patient examined, chart reviewed  plan of care discussed with Mrs [name].  Agree with assessment and plan Dr Laurelyn Sickle MD  Kennieth Rad, MSN, AGACNP-BC [facility] Pgr: [phone] or if no answer [phone] 06/24/2018, 6:08 AM

## 2018-06-24 NOTE — ED Notes (Signed)
Gastric fluid noted from OG tube when checked. Gurgling heard at epigastric area when air is pushed through OG tube.

## 2018-06-24 NOTE — Progress Notes (Signed)
RT transported patient from ED to CT and back without any complications.

## 2018-06-24 NOTE — Progress Notes (Signed)
Pharmacy Antibiotic Note  [name] is a 58 y.o. female admitted on 06/24/2018 with sepsis.  Pharmacy has been consulted for vancomycin/zosyn dosing. Afebrile, WBC wnl, LA 2.2. SCr 1.26>1.16 on admit.  Patient received 1x doses of vancomycin 1g IV and Zosyn in the ED.  Plan: Zosyn 3.375g IV q8h (4h infusion) Vancomycin 750 mg IV Q 24 hrs. Goal AUC 400-550. Expected AUC: 495 SCr used: 1.16 Monitor clinical progress, c/s, renal function F/u de-escalation plan/LOT, vancomycin levels as indicated   Height: 5\' 4"  (162.6 cm) Weight: 121 lb 14.6 oz (55.3 kg) IBW/kg (Calculated) : 54.7  Temp (24hrs), Avg:98 F (36.7 C), Min:96.6 F (35.9 C), Max:99.7 F (37.6 C)  Recent Labs  Lab 06/24/18 0233 06/24/18 0804  WBC 9.3  --   CREATININE 1.04*  --   LATICACIDVEN  --  2.2*    Estimated Creatinine Clearance: 50.9 mL/min (A) (by C-G formula based on SCr of 1.04 mg/dL (H)).    Allergies  Allergen Reactions  . Bystolic [Nebivolol Hcl] Shortness Of Breath  . Doxycycline Shortness Of Breath  . Amoxicillin-Pot Clavulanate     REACTION: GI symptoms  . Amoxicillin-Pot Clavulanate Other (See Comments)    REACTION: GI symptoms  . Cardizem [Diltiazem Hcl] Other (See Comments)    HAs Pt can take Norvasc HAs Pt can take Norvasc  . Carvedilol Other (See Comments)    REACTION: tired REACTION: tired  . Cefdinir Diarrhea  . Cephalexin Diarrhea    REACTION: unspecified  . Hydrochlorothiazide Other (See Comments)    Dropped BP and caused hair loss   . Influenza Vaccines Other (See Comments)    Allergic to flu vaccinations per pt.  Soreness and "cold symptoms" per pt Allergic to flu vaccinations per pt.  Soreness and "cold symptoms" per pt  . Pneumovax [Pneumococcal Polysaccharide Vaccine]     ?reaction  . Prednisone Hypertension and Other (See Comments)  . Sulfamethoxazole     Other reaction(s): Unknown REACTION: unspecified REACTION: unspecified  . Sulfasalazine     Other  reaction(s): Unknown  . Sulfonamide Derivatives   . Verapamil Other (See Comments)    fatigue fatigue    Antimicrobials this admission: 6/10 vancomycin >>  6/10 zosyn >>   Dose adjustments this admission:   Microbiology results:   Elicia Lamp, PharmD, BCPS Clinical Pharmacist 06/24/2018 8:31 PM

## 2018-06-24 NOTE — ED Notes (Signed)
Patient propped up on with pillows under right hip.

## 2018-06-25 ENCOUNTER — Inpatient Hospital Stay (HOSPITAL_COMMUNITY): Payer: BLUE CROSS/BLUE SHIELD

## 2018-06-25 DIAGNOSIS — J9601 Acute respiratory failure with hypoxia: Principal | ICD-10-CM

## 2018-06-25 DIAGNOSIS — J9602 Acute respiratory failure with hypercapnia: Secondary | ICD-10-CM

## 2018-06-25 DIAGNOSIS — I2609 Other pulmonary embolism with acute cor pulmonale: Secondary | ICD-10-CM

## 2018-06-25 DIAGNOSIS — D869 Sarcoidosis, unspecified: Secondary | ICD-10-CM

## 2018-06-25 LAB — BASIC METABOLIC PANEL
Anion gap: 12 (ref 5–15)
BUN: 24 mg/dL — ABNORMAL HIGH (ref 6–20)
CO2: 23 mmol/L (ref 22–32)
Calcium: 9 mg/dL (ref 8.9–10.3)
Chloride: 108 mmol/L (ref 98–111)
Creatinine, Ser: 1.4 mg/dL — ABNORMAL HIGH (ref 0.44–1.00)
GFR calc Af Amer: 48 mL/min — ABNORMAL LOW (ref >60)
GFR calc non Af Amer: 41 mL/min — ABNORMAL LOW (ref >60)
Glucose, Bld: 191 mg/dL — ABNORMAL HIGH (ref 70–99)
Potassium: 3.2 mmol/L — ABNORMAL LOW (ref 3.5–5.1)
Sodium: 143 mmol/L (ref 135–145)

## 2018-06-25 LAB — PHOSPHORUS: Phosphorus: 3.5 mg/dL (ref 2.5–4.6)

## 2018-06-25 LAB — GLUCOSE, CAPILLARY
Glucose-Capillary: 132 mg/dL — ABNORMAL HIGH (ref 70–99)
Glucose-Capillary: 157 mg/dL — ABNORMAL HIGH (ref 70–99)
Glucose-Capillary: 155 mg/dL — ABNORMAL HIGH (ref 70–99)
Glucose-Capillary: 116 mg/dL — ABNORMAL HIGH (ref 70–99)
Glucose-Capillary: 118 mg/dL — ABNORMAL HIGH (ref 70–99)
Glucose-Capillary: 108 mg/dL — ABNORMAL HIGH (ref 70–99)

## 2018-06-25 LAB — BLOOD GAS, ARTERIAL
Acid-base deficit: 0.8 mmol/L (ref 0.0–2.0)
Bicarbonate: 22.4 mmol/L (ref 20.0–28.0)
Drawn by: 270271
FIO2: 0.4
MECHVT: 400 mL
O2 Saturation: 98.1 %
PEEP: 5 cmH2O
Patient temperature: 98.6
RATE: 24 resp/min
pCO2 arterial: 30.8 mmHg — ABNORMAL LOW (ref 32.0–48.0)
pH, Arterial: 7.475 — ABNORMAL HIGH (ref 7.350–7.450)
pO2, Arterial: 103 mmHg (ref 83.0–108.0)

## 2018-06-25 LAB — CBC
HCT: 37.5 % (ref 36.0–46.0)
Hemoglobin: 12.2 g/dL (ref 12.0–15.0)
MCH: 30.1 pg (ref 26.0–34.0)
MCHC: 32.5 g/dL (ref 30.0–36.0)
MCV: 92.6 fL (ref 80.0–100.0)
Platelets: 207 10*3/uL (ref 150–400)
RBC: 4.05 MIL/uL (ref 3.87–5.11)
RDW: 16.6 % — ABNORMAL HIGH (ref 11.5–15.5)
WBC: 13.4 10*3/uL — ABNORMAL HIGH (ref 4.0–10.5)
nRBC: 0 % (ref 0.0–0.2)

## 2018-06-25 LAB — MAGNESIUM: Magnesium: 1.9 mg/dL (ref 1.7–2.4)

## 2018-06-25 MED ORDER — METOPROLOL TARTRATE 5 MG/5ML IV SOLN
2.5000 mg | Freq: Four times a day (QID) | INTRAVENOUS | Status: DC | PRN
  Administered 2018-06-26: 5 mg via INTRAVENOUS
  Administered 2018-06-28: 2.5 mg via INTRAVENOUS
  Filled 2018-06-25 (×2): qty 5

## 2018-06-25 MED ORDER — IPRATROPIUM-ALBUTEROL 0.5-2.5 (3) MG/3ML IN SOLN
3.0000 mL | Freq: Three times a day (TID) | RESPIRATORY_TRACT | Status: DC
  Administered 2018-06-25 – 2018-06-27 (×5): 3 mL via RESPIRATORY_TRACT
  Filled 2018-06-25 (×2): qty 3
  Filled 2018-06-25: qty 30
  Filled 2018-06-25 (×2): qty 3

## 2018-06-25 MED ORDER — APIXABAN 2.5 MG PO TABS
2.5000 mg | ORAL_TABLET | Freq: Two times a day (BID) | ORAL | Status: DC
  Administered 2018-06-25 – 2018-06-28 (×7): 2.5 mg via ORAL
  Filled 2018-06-25 (×7): qty 1

## 2018-06-25 MED ORDER — POTASSIUM CHLORIDE 20 MEQ/15ML (10%) PO SOLN
40.0000 meq | Freq: Once | ORAL | Status: AC
  Administered 2018-06-25: 40 meq
  Filled 2018-06-25: qty 30

## 2018-06-25 MED ORDER — FAMOTIDINE IN NACL 20-0.9 MG/50ML-% IV SOLN
20.0000 mg | INTRAVENOUS | Status: DC
  Administered 2018-06-25: 20 mg via INTRAVENOUS
  Filled 2018-06-25 (×2): qty 50

## 2018-06-25 MED ORDER — DIGOXIN 125 MCG PO TABS
0.1250 mg | ORAL_TABLET | Freq: Every day | ORAL | Status: DC
  Administered 2018-06-25 – 2018-06-28 (×4): 0.125 mg via ORAL
  Filled 2018-06-25 (×4): qty 1

## 2018-06-25 MED ORDER — FUROSEMIDE 10 MG/ML IJ SOLN
40.0000 mg | Freq: Once | INTRAMUSCULAR | Status: AC
  Administered 2018-06-25: 40 mg via INTRAVENOUS
  Filled 2018-06-25: qty 4

## 2018-06-25 MED ORDER — METHYLPREDNISOLONE SODIUM SUCC 40 MG IJ SOLR
40.0000 mg | Freq: Three times a day (TID) | INTRAMUSCULAR | Status: DC
  Administered 2018-06-25 (×2): 40 mg via INTRAVENOUS
  Filled 2018-06-25 (×2): qty 1

## 2018-06-25 NOTE — Progress Notes (Signed)
[place]  58 year old female with history of pulmonary sarcoidosis, asthma, atrial flutter on eliquis, HTN, anxiety, AAA, and PUD presenting to ER on 6/10 with severe respiratory distress. Pt recently seen at urgent care on 6/5 due to dyspnea, covid-19 negative, CXR -> pulmonary interstitial edema and likely RUL pneumonia and sent home on zithromax.  Pt seen by pcp on 6/9 -> started on lasix.    Pt presented to ER on 6/10 in severe respiratory distress, tx w duoneb, magnesium, epi, and solumedrol and the later ativan and lasix and became obtunded w  ABG ph 6.989 pCo2 105 po2 293   CXR-> worsening bilateral multifocal opacities Urine legionella -> negative covid-19 -> negative  Pt required intubation and admitted to critical care. Pt currently extubated and will transfer to [facility] service in AM.    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 CKD stage3 Holding Cozaar Off lasix Check cmp in am  H/o Hypertension Monitor, prev on Amlodipine/Cozaar, Lasix  Hyperglycemia Check hga1c in am Cont  ISS, may need to change to ac and  qhs   H/o PUD, GI prophylaxis pepcid 20mg  iv qday    Lew Dawes - PCP [facility]  Dr. Lake Bells - pulmonary  Virl Axe -cardiology

## 2018-06-25 NOTE — Procedures (Signed)
Extubation Procedure Note  Patient Details:   Name: [name] DOB: September 03, 1960 MRN: [id number]   Airway Documentation:    Vent end date: 06/25/18 Vent end time: 1055   Evaluation  O2 sats: stable throughout Complications: No apparent complications Patient did tolerate procedure well. Bilateral Breath Sounds: Clear, Diminished   Yes   Positive cuff leak. Patient extubated with two RTs at bedside. Patient has been placed on 4L [place] with humidity and is stable. No stridor noted.   [place] 06/25/2018, 11:00 AM

## 2018-06-25 NOTE — Evaluation (Signed)
Physical Therapy Evaluation Patient Details Name: [name] MRN: [id number] DOB: 05-30-1960 Today's Date: 06/25/2018   History of Present Illness  Pt adm with respiratory disress and intubated in 6/10. Pt extubated 6/11. PMH - pulmonary sarcoidosis, asthma, atrial flutter on eliquis, HTN, anxiety, AAA,  Clinical Impression  Pt admitted with above diagnosis and presents to PT with functional limitations due to deficits listed below (See PT problem list). Pt needs skilled PT to maximize independence and safety to allow discharge to home with sister providing initial support.      Follow Up Recommendations Home health PT;Supervision for mobility/OOB    Equipment Recommendations  Other (comment)(To be determined)    Recommendations for Other Services       Precautions / Restrictions Precautions Precautions: None      Mobility  Bed Mobility Overal bed mobility: Needs Assistance Bed Mobility: Supine to Sit     Supine to sit: Supervision     General bed mobility comments: incr time and supervision for lines  Transfers Overall transfer level: Needs assistance Equipment used: 4-wheeled walker;None Transfers: Sit to/from [facility] Sit to Stand: Min guard Stand pivot transfers: Min guard       General transfer comment: Assist for safety and lines  Ambulation/Gait Ambulation/Gait assistance: Min guard Gait Distance (Feet): 170 Feet Assistive device: 4-wheeled walker Gait Pattern/deviations: Step-through pattern;Decreased stride length Gait velocity: decr Gait velocity interpretation: 1.31 - 2.62 ft/sec, indicative of limited community ambulator General Gait Details: Slightly unsteady gait. Assist for lines and safety  Stairs            Wheelchair Mobility    Modified Rankin (Stroke Patients Only)       Balance Overall balance assessment: Mild deficits observed, not formally tested                                            Pertinent Vitals/Pain Pain Assessment: No/denies pain    Home Living Family/patient expects to be discharged to:: Private residence Living Arrangements: Alone Available Help at Discharge: Family(sister to stay with at dc) Type of Home: House Home Access: Stairs to enter   [employer] of Steps: 1 Home Layout: One level Home Equipment: None      Prior Function Level of Independence: Independent               Hand Dominance        Extremity/Trunk Assessment   Upper Extremity Assessment Upper Extremity Assessment: Overall WFL for tasks assessed    Lower Extremity Assessment Lower Extremity Assessment: Generalized weakness       Communication   Communication: No difficulties  Cognition Arousal/Alertness: Awake/alert Behavior During Therapy: WFL for tasks assessed/performed Overall Cognitive Status: Within Functional Limits for tasks assessed                                        General Comments General comments (skin integrity, edema, etc.): RHR 106, SpO2 96% on 3L, BP 101/86, HR with activity 120's, SpO2 95% on 2L with amb    Exercises     Assessment/Plan    PT Assessment Patient needs continued PT services  PT Problem List Decreased strength;Decreased balance;Decreased mobility       PT Treatment Interventions DME instruction;Gait training;Functional mobility training;Therapeutic activities;Therapeutic exercise;Balance training;Patient/family  education    PT Goals (Current goals can be found in the Care Plan section)  Acute Rehab PT Goals Patient Stated Goal: go home PT Goal Formulation: With patient Time For Goal Achievement: 07/02/18 Potential to Achieve Goals: Good    Frequency Min 3X/week   Barriers to discharge        Co-evaluation               AM-PAC PT "6 Clicks" Mobility  Outcome Measure Help needed turning from your back to your side while in a flat bed without using bedrails?: A  Little Help needed moving from lying on your back to sitting on the side of a flat bed without using bedrails?: A Little Help needed moving to and from a bed to a chair (including a wheelchair)?: A Little Help needed standing up from a chair using your arms (e.g., wheelchair or bedside chair)?: A Little Help needed to walk in hospital room?: A Little Help needed climbing 3-5 steps with a railing? : A Little 6 Click Score: 18    End of Session Equipment Utilized During Treatment: Oxygen Activity Tolerance: Patient tolerated treatment well Patient left: in chair;with call bell/phone within reach;with chair alarm set Nurse Communication: Mobility status PT Visit Diagnosis: Unsteadiness on feet (R26.81);Muscle weakness (generalized) (M62.81)    Time: 4718-5501 PT Time Calculation (min) (ACUTE ONLY): 45 min   Charges:   PT Evaluation $PT Eval Moderate Complexity: 1 Mod PT Treatments $Gait Training: 23-37 mins        [place] Pager [phone] Office [place] 06/25/2018, 5:50 PM

## 2018-06-25 NOTE — Progress Notes (Addendum)
NAME:  [name], MRN:  [id number], DOB:  10-Feb-1960, LOS: 1 ADMISSION DATE:  06/24/2018, CONSULTATION DATE:  06/24/2018 REFERRING MD:  Dr. Stark Jock, CHIEF COMPLAINT:  SOB/ leg swelling  Brief History   58 year old female with history of pulmonary sarcoidosis and asthma, recently treated for pneumonia with Zithromax and lasix for leg swelling presenting with severe respiratory distress and altered mental status requiring intubation for acute hypercarbic respiratory failure   CXR showed worsening bilateral multifocal opacities.    Past Medical History  Never smoker, Pulmonary sarcoidosis, asthma, atrial flutter on eliquis, HTN, anxiety, AAA, PUD  Significant Hospital Events   6/10 Admitted  Consults:   Procedures:  6/10 ETT >>  Significant Diagnostic Tests:  6/10 echo >> LV basal septal thinning with hypokinesis, new since prior echo 01/24/2016, strongly suggestive of cardiac involvement of sarcoidosis.  Nml LVSF RVSP 62  Micro Data:  6/10 SARS coronavirus 2>>neg 6/10 BCx 2 >> 6/10 trach asp >> 6/10 RVP >>neg  6/10 urine legionella >>         Urine strep >> neg  Antimicrobials:  6/5 azithro  6/10 zosyn  >> 6/10 vanc >>  Interim history/subjective:   On Precedex drip, awake and interactive Afebrile last 24 hours Diuresed well  Objective   Blood pressure (!) 124/96, pulse 88, temperature (!) 97.5 F (36.4 C), temperature source Oral, resp. rate (!) 24, height 5\' 4"  (1.626 m), weight 55.3 kg, SpO2 100 %.    Vent Mode: PRVC FiO2 (%):  [40 %-60 %] 40 % Set Rate:  [23 bmp-24 bmp] 24 bmp Vt Set:  [400 mL] 400 mL PEEP:  [5 cmH20] 5 cmH20 Plateau Pressure:  [17 cmH20-20 cmH20] 17 cmH20   Intake/Output Summary (Last 24 hours) at 06/25/2018 0915 Last data filed at 06/25/2018 0700 Gross per 24 hour  Intake 257.12 ml  Output 2205 ml  Net -1947.88 ml   Filed Weights   06/24/18 1728 06/24/18 1930 06/25/18 0300  Weight: 52.2 kg 55.3 kg 55.3 kg   Examination: General:   Thin AA female sedated on MV  HEENT: MM pink/moist, ETT , OGT, pupils 3/reactive, anicteric, +JVD Neuro: Awake and interactive CV: IRIR, no murmur PULM: Decreased breath sounds on right, no rhonchi, no accessory muscle use GI: soft, NT/ND, bs active  Extremities: warm/dry, 2-3+ edema below the knees  Skin: no rashes   Chest x-ray 6/11 personally reviewed which shows bilateral perihilar infiltrates?  Chronic scarring. CT reviewed which shows bilateral lower lobe new groundglass infiltrates compared to prior [facility] Problem list    Assessment & Plan:   Acute hypoxic/ hypercarbic respiratory failure- ddx could multifactorial w/ persistent multifocal opacities which could represent worsening sarcoidosis, pulmonary edema, or infectious process.  Hx of pulmonary sarcoidosis, asthma, never smoker P:  Spontaneous breathing trials with goal extubation duonebs q 4 and albuterol PRN  Decrease Solu-Medrol 40 every 8 Empiric vanc /zosyn for now favor noninfectious process and would simplify 24 hours Today Precedex with goal RA SS 0  Transient hypotension-related to propofol, now resolved Echo suggestive of cardiac sarcoidosis will obtain cardiology input and will need MRI eventually, avoid QT prolonging agents Also has pulmonary hypertension Hold norvasc, losartan, and digoxin, assess dig level  Addendum-she had cardiac MRI 09/2017 which did not show cardiac involvement, she is scheduled for outpatient cardiology appointment in 6/18 and would like to defer cardiac evaluation until then  Afib on Eliquis  P:  Rate currently controlled Resume Eliquis  Hypokalemia -replete  AKI - mild P:  Repeat Lasix 40 mg Avoid nephrotoxic agents, ensure adequate renal perfusion  Hyperglycemia s/p steroids P:  CBG q 4 SSI sensitive   Best practice:  Diet: NPO Pain/Anxiety/Delirium protocol (if indicated): precedex prn fentanyl VAP protocol (if indicated): yes DVT prophylaxis: lovenox  GI prophylaxis: pepcid Glucose control: SSI sensitive Mobility: BR Code Status: Full  Family Communication: Pershing Cox, [phone] updated by phone.  Not married, no kids.  Disposition: ICU   Summary-acute hypercarbic respiratory failure in the setting of sarcoidosis with likely cor pulmonale and new groundglass infiltrates, favor noninfectious, hopeful to extubate today but will need cardiac evaluation  The patient is critically ill with multiple organ systems failure and requires high complexity decision making for assessment and support, frequent evaluation and titration of therapies, application of advanced monitoring technologies and extensive interpretation of multiple databases. Critical Care Time devoted to patient care services described in this note independent of APP/resident  time is 35 minutes.    Kara Mead MD. [facility].  Pulmonary & Critical care Pager [phone] If no response call [phone]    06/25/2018, 9:15 AM

## 2018-06-26 ENCOUNTER — Inpatient Hospital Stay (HOSPITAL_COMMUNITY): Payer: BLUE CROSS/BLUE SHIELD

## 2018-06-26 DIAGNOSIS — R739 Hyperglycemia, unspecified: Secondary | ICD-10-CM

## 2018-06-26 DIAGNOSIS — I10 Essential (primary) hypertension: Secondary | ICD-10-CM

## 2018-06-26 DIAGNOSIS — I4891 Unspecified atrial fibrillation: Secondary | ICD-10-CM

## 2018-06-26 LAB — BASIC METABOLIC PANEL
Anion gap: 13 (ref 5–15)
BUN: 47 mg/dL — ABNORMAL HIGH (ref 6–20)
CO2: 21 mmol/L — ABNORMAL LOW (ref 22–32)
Calcium: 9.2 mg/dL (ref 8.9–10.3)
Chloride: 108 mmol/L (ref 98–111)
Creatinine, Ser: 2.07 mg/dL — ABNORMAL HIGH (ref 0.44–1.00)
GFR calc Af Amer: 30 mL/min — ABNORMAL LOW (ref >60)
GFR calc non Af Amer: 26 mL/min — ABNORMAL LOW (ref >60)
Glucose, Bld: 134 mg/dL — ABNORMAL HIGH (ref 70–99)
Potassium: 4.4 mmol/L (ref 3.5–5.1)
Sodium: 142 mmol/L (ref 135–145)

## 2018-06-26 LAB — MAGNESIUM: Magnesium: 2.3 mg/dL (ref 1.7–2.4)

## 2018-06-26 LAB — GLUCOSE, CAPILLARY
Glucose-Capillary: 102 mg/dL — ABNORMAL HIGH (ref 70–99)
Glucose-Capillary: 110 mg/dL — ABNORMAL HIGH (ref 70–99)
Glucose-Capillary: 117 mg/dL — ABNORMAL HIGH (ref 70–99)
Glucose-Capillary: 126 mg/dL — ABNORMAL HIGH (ref 70–99)
Glucose-Capillary: 126 mg/dL — ABNORMAL HIGH (ref 70–99)
Glucose-Capillary: 128 mg/dL — ABNORMAL HIGH (ref 70–99)

## 2018-06-26 LAB — HEMOGLOBIN A1C
Hgb A1c MFr Bld: 5.5 % (ref 4.8–5.6)
Mean Plasma Glucose: 111.15 mg/dL

## 2018-06-26 LAB — CBC
HCT: 38.2 % (ref 36.0–46.0)
Hemoglobin: 12.4 g/dL (ref 12.0–15.0)
MCH: 30.3 pg (ref 26.0–34.0)
MCHC: 32.5 g/dL (ref 30.0–36.0)
MCV: 93.4 fL (ref 80.0–100.0)
Platelets: 246 10*3/uL (ref 150–400)
RBC: 4.09 MIL/uL (ref 3.87–5.11)
RDW: 17.6 % — ABNORMAL HIGH (ref 11.5–15.5)
WBC: 22.2 10*3/uL — ABNORMAL HIGH (ref 4.0–10.5)
nRBC: 0 % (ref 0.0–0.2)

## 2018-06-26 LAB — LEGIONELLA PNEUMOPHILA SEROGP 1 UR AG: L. pneumophila Serogp 1 Ur Ag: NEGATIVE

## 2018-06-26 LAB — PHOSPHORUS: Phosphorus: 4 mg/dL (ref 2.5–4.6)

## 2018-06-26 MED ORDER — METOPROLOL TARTRATE 25 MG PO TABS
25.0000 mg | ORAL_TABLET | Freq: Two times a day (BID) | ORAL | Status: DC
  Administered 2018-06-26 – 2018-06-28 (×5): 25 mg via ORAL
  Filled 2018-06-26 (×5): qty 1

## 2018-06-26 MED ORDER — FAMOTIDINE 20 MG PO TABS
20.0000 mg | ORAL_TABLET | Freq: Every day | ORAL | Status: DC
  Administered 2018-06-26 – 2018-06-27 (×2): 20 mg via ORAL
  Filled 2018-06-26 (×2): qty 1

## 2018-06-26 MED ORDER — INSULIN ASPART 100 UNIT/ML ~~LOC~~ SOLN
0.0000 [IU] | Freq: Three times a day (TID) | SUBCUTANEOUS | Status: DC
  Administered 2018-06-26: 13:00:00 1 [IU] via SUBCUTANEOUS

## 2018-06-26 MED ORDER — ACETAMINOPHEN 325 MG PO TABS
650.0000 mg | ORAL_TABLET | Freq: Four times a day (QID) | ORAL | Status: DC | PRN
  Administered 2018-06-26 (×2): 650 mg via ORAL
  Filled 2018-06-26 (×2): qty 2

## 2018-06-26 NOTE — Progress Notes (Signed)
Physical Therapy Treatment Patient Details Name: [name] MRN: [id number] DOB: 02/27/1960 Today's Date: 06/26/2018    History of Present Illness Pt adm with respiratory disress and intubated in 6/10. Pt extubated 6/11. PMH - pulmonary sarcoidosis, asthma, atrial flutter on eliquis, HTN, anxiety, AAA,    PT Comments    Patient seen for mobility progression. Patient ambulating short distance in room and able to stand at sink for hand hygiene without need for UE support. Ambulating in hallway for progressive distances with RW - does require 4 standing rest breaks with cueing for PLB - SpO2 on 2L O2 99-100% with mobility.  Patient making good progress towards goals. Will continue to follow.    Follow Up Recommendations  Home health PT;Supervision for mobility/OOB     Equipment Recommendations  Rolling walker with 5" wheels(patient may progress away from this)    Recommendations for Other Services       Precautions / Restrictions Precautions Precautions: None Restrictions Weight Bearing Restrictions: No    Mobility  Bed Mobility               General bed mobility comments: sitting EOB upon arrival  Transfers Overall transfer level: Needs assistance Equipment used: Rolling walker (2 wheeled) Transfers: Sit to/from Stand Sit to Stand: Min guard;Supervision         General transfer comment: min guard to supervision for safety - no LOB  Ambulation/Gait Ambulation/Gait assistance: Min guard;Supervision Gait Distance (Feet): (length of 6E hallway x 4) Assistive device: Rolling walker (2 wheeled) Gait Pattern/deviations: Step-through pattern;Decreased stride length Gait velocity: decreased   General Gait Details: patient ambulating short distance in room without AD with good stability; Ambulating in hlwaay x 4 with RW - 4 standing rest breaks with SpO2 on 2L O2 99-100%   Stairs             Wheelchair Mobility    Modified Rankin (Stroke Patients  Only)       Balance Overall balance assessment: Mild deficits observed, not formally tested                                          Cognition Arousal/Alertness: Awake/alert Behavior During Therapy: WFL for tasks assessed/performed Overall Cognitive Status: Within Functional Limits for tasks assessed                                        Exercises      General Comments General comments (skin integrity, edema, etc.): patient very appreciative of therapy services      Pertinent Vitals/Pain Pain Assessment: No/denies pain    Home Living                      Prior Function            PT Goals (current goals can now be found in the care plan section) Acute Rehab PT Goals Patient Stated Goal: go home PT Goal Formulation: With patient Time For Goal Achievement: 07/02/18 Potential to Achieve Goals: Good Progress towards PT goals: Progressing toward goals    Frequency    Min 3X/week      PT Plan Current plan remains appropriate    Co-evaluation              AM-PAC PT "  6 Clicks" Mobility   Outcome Measure  Help needed turning from your back to your side while in a flat bed without using bedrails?: A Little Help needed moving from lying on your back to sitting on the side of a flat bed without using bedrails?: A Little Help needed moving to and from a bed to a chair (including a wheelchair)?: A Little Help needed standing up from a chair using your arms (e.g., wheelchair or bedside chair)?: A Little Help needed to walk in hospital room?: A Little Help needed climbing 3-5 steps with a railing? : A Little 6 Click Score: 18    End of Session Equipment Utilized During Treatment: Gait belt;Back brace Activity Tolerance: Patient tolerated treatment well Patient left: in bed;with call bell/phone within reach;with nursing/sitter in room Nurse Communication: Mobility status PT Visit Diagnosis: Unsteadiness on feet  (R26.81);Muscle weakness (generalized) (M62.81)     Time: 1610-9604 PT Time Calculation (min) (ACUTE ONLY): 27 min  Charges:  $Gait Training: 8-22 mins $Therapeutic Activity: 8-22 mins                      Lanney Gins, PT, DPT Supplemental Physical Therapist 06/26/18 1:15 PM Pager: [phone] Office: [phone]

## 2018-06-26 NOTE — Progress Notes (Signed)
Pt's HR sustained in the 140's-150's after receiving Solumedrol last night. PRN Metoprolol 5mg  given x 1, HR <100.  She  refused scheduled 0600 Solu-Medrol this morning. She  wants to discuss with MD this morning. Will continue to monitor.

## 2018-06-26 NOTE — Progress Notes (Signed)
PROGRESS NOTE    [name]  ONG:[id number] DOB: 02/25/1960 DOA: 06/24/2018 PCP: Cassandria Anger, MD   Brief Narrative: [name] is a 58 y.o. female with history of pulmonary sarcoidosis, asthma, atrial flutter on eliquis, HTN, anxiety, AAA, and PUD. Patient presented secondary to severe respiratory distress found to have hypoxia and hypercarbia, requiring mechanical ventilation. She has improved on antibiotics, steroids and lasix.   Assessment & Plan:   Active Problems:   Acute respiratory failure (HCC)  Acute respiratory failure with hypoxia and hypercapnia Likely multifactorial in setting of possible pneumonia, sarcoid flare, pulmonary edema. Required mechanical ventilation from 6/10 to 6/11. Patient declining steroid therapy secondary to tachycardia. She has been on empiric vancomycin and Zosyn. Procalcitonin was elevated. -Continue antibiotics -Wean to room air  Pulmonary/hepatic sarcoidosis Possible flare. She has been managed on steroids this hospitalization. Cardiac MRI negative for sarcoid  Atrial fibrillation/flutter Patient with a history of Atrial fibrillation on Eliquis -Will start metoprolol 25 mg BID -Continue Digoxin 0.125 mg daily -Continue PRN metoprolol IV  AKI on CKD stage III In setting of lasix. -Will hold lasix today -Repeat BMP tomorrow -Watch UOP  Hypokalemia Resolved.  Hyperglycemia Hemoglobin A1C of 5.5%. in setting of steroids. -Continue SSI  Leukocytosis Secondary to steroids.  History of PUD -Continue Pepcid  DVT prophylaxis: Eliquis Code Status:   Code Status: Full Code Family Communication: Sister on telephone Disposition Plan: Discharge home likely in 2-3 days   Consultants:   PCCM  Procedures:   6/10-6/11: ETT  Antimicrobials:  Vancomycin  Zosyn    Subjective: Palpitations. No chest pain or dyspnea.  Objective: Vitals:   06/25/18 2109 06/25/18 2127 06/26/18 0516 06/26/18 0925  BP: 113/78   (!) 116/94   Pulse: 97 97 (!) 111   Resp:  20 18   Temp: 98.3 F (36.8 C)  (!) 97.3 F (36.3 C)   TempSrc: Oral  Oral   SpO2: 97%  100% 100%  Weight:   51.4 kg   Height:        Intake/Output Summary (Last 24 hours) at 06/26/2018 1010 Last data filed at 06/25/2018 2301 Gross per 24 hour  Intake 300.86 ml  Output -  Net 300.86 ml   Filed Weights   06/24/18 1930 06/25/18 0300 06/26/18 0516  Weight: 55.3 kg 55.3 kg 51.4 kg    Examination:  General exam: Appears calm and comfortable Respiratory system: Clear to auscultation. Respiratory effort normal. Cardiovascular system: S1 & S2 heard, Tachycardia. Intermittent arrhythmia but generally sounds regular. No murmurs, rubs, gallops or clicks. Gastrointestinal system: Abdomen is nondistended, soft and nontender. No organomegaly or masses felt. Normal bowel sounds heard. Central nervous system: Alert and oriented. No focal neurological deficits. Extremities: No edema. No calf tenderness Skin: No cyanosis. No rashes Psychiatry: Judgement and insight appear normal. Mood & affect appropriate.     Data Reviewed: I have personally reviewed following labs and imaging studies  CBC: Recent Labs  Lab 06/24/18 0233 06/24/18 0438 06/24/18 1030 06/25/18 0500 06/26/18 0525  WBC 9.3  --   --  13.4* 22.2*  NEUTROABS 4.3  --   --   --   --   HGB 11.3* 11.9* 12.9 12.2 12.4  HCT 37.0 35.0* 38.0 37.5 38.2  MCV 100.0  --   --  92.6 93.4  PLT PLATELET CLUMPS NOTED ON SMEAR, UNABLE TO ESTIMATE  --   --  207 440   Basic Metabolic Panel: Recent Labs  Lab 06/24/18 0233 06/24/18  2458 06/24/18 0524 06/24/18 1030 06/25/18 0500 06/26/18 0525  NA 138 141  --  143 143 142  K 2.7* 3.5  --  3.1* 3.2* 4.4  CL 108  --   --   --  108 108  CO2 19*  --   --   --  23 21*  GLUCOSE 201*  --   --   --  191* 134*  BUN 14  --   --   --  24* 47*  CREATININE 1.04*  --   --   --  1.40* 2.07*  CALCIUM 7.5*  --   --   --  9.0 9.2  MG  --   --  2.2  --   1.9 2.3  PHOS  --   --  4.8*  --  3.5 4.0   GFR: Estimated Creatinine Clearance: 24 mL/min (A) (by C-G formula based on SCr of 2.07 mg/dL (H)). Liver Function Tests: Recent Labs  Lab 06/24/18 0233  AST 46*  ALT 34  ALKPHOS 94  BILITOT 1.3*  PROT 5.7*  ALBUMIN 2.6*   No results for input(s): LIPASE, AMYLASE in the last 168 hours. No results for input(s): AMMONIA in the last 168 hours. Coagulation Profile: No results for input(s): INR, PROTIME in the last 168 hours. Cardiac Enzymes: Recent Labs  Lab 06/24/18 0233 06/24/18 0804 06/24/18 1845  TROPONINI 0.03* 0.04* <0.03   BNP (last 3 results) No results for input(s): PROBNP in the last 8760 hours. HbA1C: Recent Labs    06/26/18 0525  HGBA1C 5.5   CBG: Recent Labs  Lab 06/25/18 1552 06/25/18 2014 06/26/18 0014 06/26/18 0519 06/26/18 0732  GLUCAP 118* 108* 126* 110* 117*   Lipid Profile: No results for input(s): CHOL, HDL, LDLCALC, TRIG, CHOLHDL, LDLDIRECT in the last 72 hours. Thyroid Function Tests: No results for input(s): TSH, T4TOTAL, FREET4, T3FREE, THYROIDAB in the last 72 hours. Anemia Panel: No results for input(s): VITAMINB12, FOLATE, FERRITIN, TIBC, IRON, RETICCTPCT in the last 72 hours. Sepsis Labs: Recent Labs  Lab 06/24/18 0524 06/24/18 0804  PROCALCITON 0.47  --   LATICACIDVEN  --  2.2*    Recent Results (from the past 240 hour(s))  SARS Coronavirus 2     Status: None   Collection Time: 06/24/18  2:47 AM  Result Value Ref Range Status   SARS Coronavirus 2 NOT DETECTED NOT DETECTED Final    Comment: (NOTE) SARS-CoV-2 target nucleic acids are NOT DETECTED. The SARS-CoV-2 RNA is generally detectable in upper and lower respiratory specimens during the acute phase of infection.  Negative  results do not preclude SARS-CoV-2 infection, do not rule out co-infections with other pathogens, and should not be used as the sole basis for treatment or other patient management decisions.  Negative  results must be combined with clinical observations, patient history, and epidemiological information. The expected result is Not Detected. Fact Sheet for Patients: http://www.biofiredefense.com/wp-content/uploads/2020/03/BIOFIRE-COVID -19-patients.pdf Fact Sheet for Healthcare Providers: http://www.biofiredefense.com/wp-content/uploads/2020/03/BIOFIRE-COVID -19-hcp.pdf This test is not yet approved or cleared by the [place] and  has been authorized for detection and/or diagnosis of SARS-CoV-2 by FDA under an Emergency Use Authorization (EUA).  This EUA will remain in effec t (meaning this test can be used) for the duration of  the COVID-19 declaration under Section 564(b)(1) of the Act, 21 U.S.C. section 360bbb-3(b)(1), unless the authorization is terminated or revoked sooner. Performed at [facility], [place] [address] [place], [place] [address]   Culture, blood (routine x 2)  Status: None (Preliminary result)   Collection Time: 06/24/18  5:25 AM   Specimen: BLOOD LEFT HAND  Result Value Ref Range Status   Specimen Description BLOOD LEFT HAND  Final   Special Requests   Final    BOTTLES DRAWN AEROBIC ONLY Blood Culture adequate volume   Culture   Final    NO GROWTH 2 DAYS Performed at [facility], [address], [place], [place] [address]    Report Status PENDING  Incomplete  Blood culture (routine x 2)     Status: None (Preliminary result)   Collection Time: 06/24/18  5:25 AM   Specimen: BLOOD  Result Value Ref Range Status   Specimen Description BLOOD RIGHT ANTECUBITAL  Final   Special Requests   Final    BOTTLES DRAWN AEROBIC AND ANAEROBIC Blood Culture adequate volume   Culture   Final    NO GROWTH 2 DAYS Performed at [facility], [place] [address] [place], [place] [address]    Report Status PENDING  Incomplete  Respiratory Panel by PCR     Status: None   Collection Time: 06/24/18  5:39 AM   Specimen: Nasopharyngeal Swab; Respiratory   Result Value Ref Range Status   Adenovirus NOT DETECTED NOT DETECTED Final   Coronavirus 229E NOT DETECTED NOT DETECTED Final    Comment: (NOTE) The Coronavirus on the Respiratory Panel, DOES NOT test for the novel  Coronavirus (2019 nCoV)    Coronavirus HKU1 NOT DETECTED NOT DETECTED Final   Coronavirus NL63 NOT DETECTED NOT DETECTED Final   Coronavirus OC43 NOT DETECTED NOT DETECTED Final   Metapneumovirus NOT DETECTED NOT DETECTED Final   Rhinovirus / Enterovirus NOT DETECTED NOT DETECTED Final   Influenza A NOT DETECTED NOT DETECTED Final   Influenza B NOT DETECTED NOT DETECTED Final   Parainfluenza Virus 1 NOT DETECTED NOT DETECTED Final   Parainfluenza Virus 2 NOT DETECTED NOT DETECTED Final   Parainfluenza Virus 3 NOT DETECTED NOT DETECTED Final   Parainfluenza Virus 4 NOT DETECTED NOT DETECTED Final   Respiratory Syncytial Virus NOT DETECTED NOT DETECTED Final   Bordetella pertussis NOT DETECTED NOT DETECTED Final   Chlamydophila pneumoniae NOT DETECTED NOT DETECTED Final   Mycoplasma pneumoniae NOT DETECTED NOT DETECTED Final    Comment: Performed at [facility], [place]. [address], [place], [place] [address]  Culture, blood (routine x 2)     Status: None (Preliminary result)   Collection Time: 06/24/18  8:51 AM   Specimen: BLOOD  Result Value Ref Range Status   Specimen Description BLOOD SITE NOT SPECIFIED  Final   Special Requests   Final    BOTTLES DRAWN AEROBIC AND ANAEROBIC Blood Culture adequate volume   Culture   Final    NO GROWTH 2 DAYS Performed at [facility], [address] [place], [place] [address]    Report Status PENDING  Incomplete  MRSA PCR Screening     Status: None   Collection Time: 06/24/18  5:39 PM   Specimen: Nasal Mucosa; Nasopharyngeal  Result Value Ref Range Status   MRSA by PCR NEGATIVE NEGATIVE Final    Comment:        The GeneXpert MRSA Assay (FDA approved for NASAL specimens only), is one component of a comprehensive MRSA  colonization surveillance program. It is not intended to diagnose MRSA infection nor to guide or monitor treatment for MRSA infections. Performed at [facility], [place],  [place] [address]   Blood culture (routine x 2)     Status: None (Preliminary result)   Collection Time: 06/24/18  6:45 PM   Specimen: BLOOD RIGHT HAND  Result Value Ref Range Status   Specimen Description BLOOD RIGHT HAND  Final   Special Requests   Final    BOTTLES DRAWN AEROBIC ONLY Blood Culture adequate volume   Culture   Final    NO GROWTH 2 DAYS Performed at [facility], [place] [address], [place], [place] [address]    Report Status PENDING  Incomplete         Radiology Studies: Dg Chest Port 1 View  Result Date: 06/26/2018 CLINICAL DATA:  Respiratory failure EXAM: PORTABLE CHEST 1 VIEW COMPARISON:  06/25/2018 FINDINGS: Interval removal of endotracheal and esophagogastric tubes. No change in heterogeneous airspace opacity of the right upper lobe and left mid lung. No new or focal airspace opacity. Unchanged gross cardiomegaly. IMPRESSION: Interval removal of endotracheal and esophagogastric tubes. No change in heterogeneous airspace opacity of the right upper lobe and left mid lung. No new or focal airspace opacity. Unchanged gross cardiomegaly. Electronically Signed   By: Eddie Candle M.D.   On: 06/26/2018 08:38   Dg Chest Port 1 View  Result Date: 06/25/2018 CLINICAL DATA:  Respiratory failure EXAM: PORTABLE CHEST 1 VIEW COMPARISON:  Chest radiograph from one day prior. FINDINGS: Endotracheal tube tip is 2.2 cm above the carina. Enteric tube enters stomach with the tip not seen on this image. Left subclavian central venous catheter terminates in the left brachiocephalic vein near the junction with the SVC. Stable cardiomediastinal silhouette with mild cardiomegaly. No pneumothorax. No pleural effusion. Patchy opacity throughout the right greater than left lungs, predominantly  perihilar, similar. IMPRESSION: 1. Well-positioned support structures. 2. Stable cardiomegaly and patchy opacity throughout the right greater than left lungs, predominantly perihilar. Electronically Signed   By: Ilona Sorrel M.D.   On: 06/25/2018 08:35        Scheduled Meds: . apixaban  2.5 mg Oral BID  . digoxin  0.125 mg Oral Daily  . insulin aspart  0-9 Units Subcutaneous TID WC  . ipratropium-albuterol  3 mL Nebulization TID   Continuous Infusions: . famotidine (PEPCID) IV 20 mg (06/25/18 2258)  . piperacillin-tazobactam (ZOSYN)  IV 3.375 g (06/26/18 0659)     LOS: 2 days     Cordelia Poche, MD [facility] 06/26/2018, 10:10 AM  If 7PM-7AM, please contact night-coverage [email or web address]

## 2018-06-26 NOTE — Progress Notes (Addendum)
NAME:  [name], MRN:  [id number], DOB:  [birth year], LOS: 2 ADMISSION DATE:  06/24/2018, CONSULTATION DATE:  06/24/2018 REFERRING MD:  Dr. Stark Jock, CHIEF COMPLAINT:  SOB/ leg swelling  Brief History   58 year old female with history of pulmonary sarcoidosis and asthma, recently treated for pneumonia with Zithromax and lasix for leg swelling presenting with severe respiratory distress and altered mental status requiring intubation for acute hypercarbic respiratory failure   CXR showed worsening bilateral multifocal opacities.    Past Medical History  Never smoker, Pulmonary sarcoidosis, asthma, atrial flutter on eliquis, HTN, anxiety, AAA, PUD  Significant Hospital Events   6/10 Admitted  Consults:   Procedures:  6/10 ETT >>6/11  Significant Diagnostic Tests:  6/10 echo >> LV basal septal thinning with hypokinesis, new since prior echo 01/24/2016, strongly suggestive of cardiac involvement of sarcoidosis.  Nml LVSF RVSP 62  Micro Data:  6/10 SARS coronavirus 2>>neg 6/10 BCx 2 >> 6/10 trach asp >> 6/10 RVP >>neg  6/10 urine legionella >>         Urine strep >> neg  Antimicrobials:  6/5 azithro  6/10 zosyn  >> 6/10 vanc >>  Interim history/subjective:  Extubated 6/10 at 1055 am Saturations are 100% on 3 L Per nursing she got tachy after dose of Solumedrol 6/10 pm. Has refused her am  Dose, now d/c'd by [facility] as she has had similar reactions to high dose steroids in the past.   Objective   Blood pressure (!) 116/94, pulse (!) 111, temperature (!) 97.3 F (36.3 C), temperature source Oral, resp. rate 18, height 5\' 4"  (1.626 m), weight 51.4 kg, SpO2 100 %.        Intake/Output Summary (Last 24 hours) at 06/26/2018 1034 Last data filed at 06/25/2018 2301 Gross per 24 hour  Intake 300.86 ml  Output -  Net 300.86 ml   Filed Weights   06/24/18 1930 06/25/18 0300 06/26/18 0516  Weight: 55.3 kg 55.3 kg 51.4 kg   Examination: General:  Thin AA female , awake alert  and oriented. In NAD HEENT: MM pink/moist, pupils 3/reactive, anicteric, trace JVD Neuro: Awake and alert, appropriate, MAE x 4, A&O x 3 CV: IRIR,S1, S2,  no murmur PULM: Bilateral chest excursion, Decreased breath sounds on right, no rhonchi, no accessory muscle use, cough with deep inspiration GI: soft, NT/ND, bs active, thin, Body mass index is 19.47 kg/m.  Extremities: warm/dry, 2+ edema below the knees , brisk refill per nailbeds, no obvious deformities Skin: no rashes, no lesions, warm dry and intact   Chest x-ray 6/12 personally reviewed which shows No change in heterogeneous airspace opacity of the right upper lobe and left mid lung.Cardiomegaly CT reviewed which shows bilateral lower lobe new groundglass infiltrates compared to prior [facility] Problem list    Assessment & Plan:   Acute hypoxic/ hypercarbic respiratory failure-  ddx could multifactorial w/ persistent multifocal opacities which could represent worsening sarcoidosis, pulmonary edema, or infectious process.  Hx of pulmonary sarcoidosis, asthma, never smoker Extubated 6/11 Currently on 3 L [place] with sats of 99% ADR to Solumedrol ( tachycardia) PCT 0.43 on 6/11 WBC 22 on 6/11 P:  Titrate oxygen for sats of > 94% duonebs q 4 and albuterol PRN  Consider replacing Solumedrol with low dose prednisone taper short term Aggressive pulmonary Toilet IS Q 2 PT/OT OOB and ambulate with assist Will  need home PT/OT Agree with just Zosyn empirically Follow micro Minimize all sedating medications Also has pulmonary  hypertension Will need OP Pulmonary Follow up Will need ambulatory saturation  Prior to dc home    Afib on Eliquis Cardiac MRI 09/2017>> Negative for  cardiac sarcoid  P:  Rate currently controlled Resume Eliquis Hold norvasc, losartan, and digoxin, assess dig level Monitor for QTc prolongation EKG prn Has OP cards appointment 6/18>> cardiac eval Monitor for bleeding avoid QT prolonging  agents  AKI -  Renal function worsening over night  P:  Hold diuresis Avoid nephrotoxic agents, ensure adequate renal perfusion Trend BMET Monitor  UO Consider renal consult  Hyperglycemia s/p steroids P:  CBG q 4 SSI sensitive   She is very concerned about  The cost of this admission. [facility] is out of network for her insurance plan Case management is working with her   PCCM will sign off as patient is doing well post extubation. Pt was picked up by [facility] 6/12 as primary. Hospital Follow up with [facility] at 10:30 am Friday 07/10/2018 Please don't hesitate to re-consult  if we can be of any further assistance.   Best practice:  Diet: Per primary team Pain/Anxiety/Delirium protocol (if indicated):NA  VAP protocol (if indicated): NA DVT prophylaxis: lovenox GI prophylaxis: pepcid Glucose control: SSI sensitive Mobility:OOB to Chair/ Ambulate with assist Code Status: Full  Family Communication: Per primary team Disposition: ICU   Summary-acute hypercarbic respiratory failure in the setting of sarcoidosis with likely cor pulmonale and new groundglass infiltrates. ? etiology. Extubated 6/11     Magdalen Spatz, MSN, AGACNP-BC [facility] Pager [phone] If no response call [phone]    06/26/2018, 10:34 AM

## 2018-06-26 NOTE — TOC Initial Note (Signed)
Transition of Care [facility]) - Initial/Assessment Note    Patient Details  Name: [name] MRN: [id number] Date of Birth: [birth year]  [facility] CM/SW Contact:    Bethena Roys, RN Phone Number: 06/26/2018, 4:42 PM  Clinical Narrative:  Pt presented for Acute Respiratory Failure- PTA from home alone. Pt has [employer] local Medicare via [employer]. PT consulted and recommended HH PT. CM called several agencies and the insurance is not in network. [facility] is listed on the website and they are not able to service the patient. [facility], [facility], [facility] unable to service due to not being in network.  CM will relay the results back to patient. CM will continue to monitor.                 Expected Discharge Plan: [place] Barriers to Discharge: Continued Medical Work up   Patient Goals and CMS Choice        Expected Discharge Plan and Services Expected Discharge Plan: [place] In-house Referral: NA Discharge Planning Services: CM Consult Post Acute Care Choice: [place] arrangements for the past 2 months: (sister is visiting  with patient at this time.) Expected Discharge Date: 06/28/18                         [facility] Arranged: PT          Prior Living Arrangements/Services Living arrangements for the past 2 months: (sister is visiting  with patient at this time.) Lives with:: Self Patient language and need for interpreter reviewed:: Yes Do you feel safe going back to the place where you live?: Yes      Need for Family Participation in Patient Care: Yes (Comment) Care giver support system in place?: Yes (comment) Current home services: (none) Criminal Activity/Legal Involvement Pertinent to Current Situation/Hospitalization: No - Comment as needed  Activities of Daily Living   ADL Screening (condition at time of admission) Patient's cognitive ability adequate to safely complete daily  activities?: Yes Is the patient deaf or have difficulty hearing?: No Does the patient have difficulty seeing, even when wearing glasses/contacts?: No Does the patient have difficulty concentrating, remembering, or making decisions?: No Patient able to express need for assistance with ADLs?: Yes Does the patient have difficulty dressing or bathing?: No Independently performs ADLs?: Yes (appropriate for developmental age) Does the patient have difficulty walking or climbing stairs?: Yes(per sister Vaughan Basta) Weakness of Legs: Both Weakness of Arms/Hands: None  Permission Sought/Granted Permission sought to share information with : Family Supports                Emotional Assessment Appearance:: Appears stated age Attitude/Demeanor/Rapport: Engaged Affect (typically observed): Accepting Orientation: : Oriented to Self, Oriented to Place, Oriented to  Time, Oriented to Situation Alcohol / Substance Use: Not Applicable Psych Involvement: No (comment)  Admission diagnosis:  Encounter for central line placement [Z45.2] History of sarcoidosis [Z86.2] Acute respiratory failure with hypercapnia ([place]) [J96.02] Patient Active Problem List   Diagnosis Date Noted  . Acute respiratory failure ([place]) 06/24/2018  . Pulmonary sarcoidosis ([place])   . PUD (peptic ulcer disease)   . Multiple thyroid nodules   . Heart murmur   . Depression   . Cataract   . Atrial flutter ([place])   . Anxiety   . Allergy   . AAA (abdominal aortic aneurysm) ([place])   . Weight loss 02/29/2016  . Atrial fibrillation (  [place]) 01/23/2016  . Tubular adenoma of colon 07/15/2015  . Acute maxillary sinusitis 04/08/2015  . Colon cancer screening 02/21/2015  . Abdominal pain, left lower quadrant 06/20/2014  . Glaucoma 05/31/2014  . Loose stools 05/31/2014  . Dislocation of finger PIP joint 01/03/2014  . Finger pain, right 12/28/2013  . B12 deficiency 12/15/2013  . Hypercalcemia 09/10/2013  . Creatinine elevation 09/10/2013  .  Nonallopathic lesion of cervical region 06/11/2013  . Elevated LFTs 05/21/2013  . Greater trochanteric bursitis of right hip 05/14/2013  . Hypokalemia 04/12/2013  . Right acetabular fracture ([place]) 04/02/2013  . Acute right hip pain 03/31/2013  . Right sciatic nerve pain 02/26/2013  . Neck pain on right side 02/26/2013  . Exposure to the flu 01/25/2013  . Fall due to stumbling 07/24/2012  . Onychomycosis 04/27/2012  . Dizziness 01/14/2012  . Abdominal pain, left upper quadrant 09/30/2011  . Irritable bladder 09/02/2011  . Hyperglycemia 09/02/2011  . Nasal turbinate hypertrophy 03/08/2011  . Hypersomnia 09/12/2010  . VAGINITIS 02/27/2010  . Headache(784.0) 02/13/2010  . TMJ PAIN 10/20/2009  . GRIEF REACTION 02/23/2009  . PERIMENOPAUSAL SYNDROME 05/24/2008  . FATIGUE 12/07/2007  . TACHYCARDIA 12/07/2007  . GANGLION CYST, WRIST, LEFT 07/17/2007  . Sarcoidosis 06/28/2007  . Allergic rhinitis 03/24/2007  . Osteoarthritis 03/24/2007  . HAND PAIN 03/24/2007  . TINEA PEDIS 12/10/2006  . Depression with anxiety 12/10/2006  . Asthma, moderate persistent 12/10/2006  . GLAUCOMA NOS 11/06/2006  . Essential hypertension 11/06/2006  . OSTEOPOROSIS 11/06/2006   PCP:  Cassandria Anger, MD Pharmacy:   [id number] [place], [place] [address] Phone: [phone] Fax: [phone]     Social Determinants of Health (SDOH) Interventions    Readmission Risk Interventions No flowsheet data found.

## 2018-06-27 ENCOUNTER — Inpatient Hospital Stay (HOSPITAL_COMMUNITY): Payer: BLUE CROSS/BLUE SHIELD

## 2018-06-27 LAB — URINALYSIS, ROUTINE W REFLEX MICROSCOPIC
Bacteria, UA: NONE SEEN
Bilirubin Urine: NEGATIVE
Glucose, UA: NEGATIVE mg/dL
Ketones, ur: NEGATIVE mg/dL
Leukocytes,Ua: NEGATIVE
Nitrite: NEGATIVE
Protein, ur: 30 mg/dL — AB
Specific Gravity, Urine: 1.019 (ref 1.005–1.030)
pH: 5 (ref 5.0–8.0)

## 2018-06-27 LAB — CBC
HCT: 39.2 % (ref 36.0–46.0)
Hemoglobin: 12.8 g/dL (ref 12.0–15.0)
MCH: 30 pg (ref 26.0–34.0)
MCHC: 32.7 g/dL (ref 30.0–36.0)
MCV: 91.8 fL (ref 80.0–100.0)
Platelets: 250 10*3/uL (ref 150–400)
RBC: 4.27 MIL/uL (ref 3.87–5.11)
RDW: 17.2 % — ABNORMAL HIGH (ref 11.5–15.5)
WBC: 19.3 10*3/uL — ABNORMAL HIGH (ref 4.0–10.5)
nRBC: 0.2 % (ref 0.0–0.2)

## 2018-06-27 LAB — CREATININE, URINE, RANDOM: Creatinine, Urine: 94.55 mg/dL

## 2018-06-27 LAB — BASIC METABOLIC PANEL
Anion gap: 14 (ref 5–15)
Anion gap: 11 (ref 5–15)
BUN: 58 mg/dL — ABNORMAL HIGH (ref 6–20)
BUN: 56 mg/dL — ABNORMAL HIGH (ref 6–20)
CO2: 18 mmol/L — ABNORMAL LOW (ref 22–32)
CO2: 19 mmol/L — ABNORMAL LOW (ref 22–32)
Calcium: 9.2 mg/dL (ref 8.9–10.3)
Calcium: 8.7 mg/dL — ABNORMAL LOW (ref 8.9–10.3)
Chloride: 106 mmol/L (ref 98–111)
Chloride: 106 mmol/L (ref 98–111)
Creatinine, Ser: 2.2 mg/dL — ABNORMAL HIGH (ref 0.44–1.00)
Creatinine, Ser: 2.12 mg/dL — ABNORMAL HIGH (ref 0.44–1.00)
GFR calc Af Amer: 28 mL/min — ABNORMAL LOW (ref >60)
GFR calc Af Amer: 29 mL/min — ABNORMAL LOW (ref >60)
GFR calc non Af Amer: 24 mL/min — ABNORMAL LOW (ref >60)
GFR calc non Af Amer: 25 mL/min — ABNORMAL LOW (ref >60)
Glucose, Bld: 102 mg/dL — ABNORMAL HIGH (ref 70–99)
Glucose, Bld: 93 mg/dL (ref 70–99)
Potassium: 4.1 mmol/L (ref 3.5–5.1)
Potassium: 3.6 mmol/L (ref 3.5–5.1)
Sodium: 138 mmol/L (ref 135–145)
Sodium: 136 mmol/L (ref 135–145)

## 2018-06-27 LAB — GLUCOSE, CAPILLARY
Glucose-Capillary: 80 mg/dL (ref 70–99)
Glucose-Capillary: 108 mg/dL — ABNORMAL HIGH (ref 70–99)
Glucose-Capillary: 86 mg/dL (ref 70–99)
Glucose-Capillary: 104 mg/dL — ABNORMAL HIGH (ref 70–99)

## 2018-06-27 LAB — SODIUM, URINE, RANDOM: Sodium, Ur: 13 mmol/L

## 2018-06-27 MED ORDER — IPRATROPIUM-ALBUTEROL 0.5-2.5 (3) MG/3ML IN SOLN
3.0000 mL | Freq: Two times a day (BID) | RESPIRATORY_TRACT | Status: DC
  Administered 2018-06-27: 3 mL via RESPIRATORY_TRACT
  Filled 2018-06-27: qty 3

## 2018-06-27 MED ORDER — ALBUTEROL SULFATE (2.5 MG/3ML) 0.083% IN NEBU
2.5000 mg | INHALATION_SOLUTION | RESPIRATORY_TRACT | Status: DC | PRN
  Administered 2018-06-28 (×2): 2.5 mg via RESPIRATORY_TRACT
  Filled 2018-06-27 (×2): qty 3

## 2018-06-27 NOTE — Progress Notes (Signed)
PROGRESS NOTE    [name]  QBH:[id number] DOB: 26-Nov-1960 DOA: 06/24/2018 PCP: Cassandria Anger, MD   Brief Narrative: [name] is a 58 y.o. female with history of pulmonary sarcoidosis, asthma, atrial flutter on eliquis, HTN, anxiety, AAA, and PUD. Patient presented secondary to severe respiratory distress found to have hypoxia and hypercarbia, requiring mechanical ventilation. She has improved on antibiotics, steroids and lasix.   Assessment & Plan:   Active Problems:   Essential hypertension   Hyperglycemia   Atrial fibrillation (HCC)   Acute respiratory failure (HCC)  Acute respiratory failure with hypoxia and hypercapnia Likely multifactorial in setting of possible pneumonia, sarcoid flare, pulmonary edema. Required mechanical ventilation from 6/10 to 6/11. Patient declining steroid therapy secondary to tachycardia. She has been on empiric vancomycin and Zosyn. Procalcitonin was elevated. -Continue antibiotics for today -Wean to room air  Pulmonary/hepatic sarcoidosis Possible flare. She has been managed on steroids this hospitalization. Cardiac MRI negative for sarcoid.  Atrial fibrillation/flutter Patient with a history of Atrial fibrillation on Eliquis -Continue metoprolol 25 mg BID -Continue Digoxin 0.125 mg daily -Continue PRN metoprolol IV  AKI on CKD stage III In setting of lasix. -Continue to hold Lasix -Watch UOP  Hypokalemia Resolved.  Hyperglycemia Hemoglobin A1C of 5.5%. in setting of steroids. -Continue SSI  Leukocytosis Secondary to steroids. improved  History of PUD -Continue Pepcid  DVT prophylaxis: Eliquis Code Status:   Code Status: Full Code Family Communication: None on telephone Disposition Plan: Discharge home likely in 1-2 days   Consultants:   PCCM  Procedures:   6/10-6/11: ETT  Antimicrobials:  Vancomycin  Zosyn    Subjective: No chest pain and dyspnea.  Objective: Vitals:   06/26/18 2123  06/27/18 0557 06/27/18 0807 06/27/18 0853  BP: (!) 130/102 96/78 (!) 123/94   Pulse: 98 85    Resp:  18    Temp: 98.2 F (36.8 C) (!) 97.4 F (36.3 C)    TempSrc: Oral Oral    SpO2: 97% 97%  97%  Weight:  52.2 kg    Height:        Intake/Output Summary (Last 24 hours) at 06/27/2018 1321 Last data filed at 06/27/2018 0800 Gross per 24 hour  Intake 360 ml  Output -  Net 360 ml   Filed Weights   06/25/18 0300 06/26/18 0516 06/27/18 0557  Weight: 55.3 kg 51.4 kg 52.2 kg    Examination:  General exam: Appears calm and comfortable Respiratory system: Clear to auscultation. Respiratory effort normal. Cardiovascular system: S1 & S2 heard, RRR. No murmurs, rubs, gallops or clicks. Gastrointestinal system: Abdomen is nondistended, soft and nontender. No organomegaly or masses felt. Normal bowel sounds heard. Central nervous system: Alert and oriented. No focal neurological deficits. Extremities: No edema. No calf tenderness Skin: No cyanosis. No rashes Psychiatry: Judgement and insight appear normal. Mood & affect appropriate.      Data Reviewed: I have personally reviewed following labs and imaging studies  CBC: Recent Labs  Lab 06/24/18 0233 06/24/18 0438 06/24/18 1030 06/25/18 0500 06/26/18 0525 06/27/18 0356  WBC 9.3  --   --  13.4* 22.2* 19.3*  NEUTROABS 4.3  --   --   --   --   --   HGB 11.3* 11.9* 12.9 12.2 12.4 12.8  HCT 37.0 35.0* 38.0 37.5 38.2 39.2  MCV 100.0  --   --  92.6 93.4 91.8  PLT PLATELET CLUMPS NOTED ON SMEAR, UNABLE TO ESTIMATE  --   --  207 246 222   Basic Metabolic Panel: Recent Labs  Lab 06/24/18 0233 06/24/18 0438 06/24/18 0524 06/24/18 1030 06/25/18 0500 06/26/18 0525 06/27/18 0356  NA 138 141  --  143 143 142 138  K 2.7* 3.5  --  3.1* 3.2* 4.4 4.1  CL 108  --   --   --  108 108 106  CO2 19*  --   --   --  23 21* 18*  GLUCOSE 201*  --   --   --  191* 134* 102*  BUN 14  --   --   --  24* 47* 58*  CREATININE 1.04*  --   --   --   1.40* 2.07* 2.20*  CALCIUM 7.5*  --   --   --  9.0 9.2 9.2  MG  --   --  2.2  --  1.9 2.3  --   PHOS  --   --  4.8*  --  3.5 4.0  --    GFR: Estimated Creatinine Clearance: 23 mL/min (A) (by C-G formula based on SCr of 2.2 mg/dL (H)). Liver Function Tests: Recent Labs  Lab 06/24/18 0233  AST 46*  ALT 34  ALKPHOS 94  BILITOT 1.3*  PROT 5.7*  ALBUMIN 2.6*   No results for input(s): LIPASE, AMYLASE in the last 168 hours. No results for input(s): AMMONIA in the last 168 hours. Coagulation Profile: No results for input(s): INR, PROTIME in the last 168 hours. Cardiac Enzymes: Recent Labs  Lab 06/24/18 0233 06/24/18 0804 06/24/18 1845  TROPONINI 0.03* 0.04* <0.03   BNP (last 3 results) No results for input(s): PROBNP in the last 8760 hours. HbA1C: Recent Labs    06/26/18 0525  HGBA1C 5.5   CBG: Recent Labs  Lab 06/26/18 1159 06/26/18 1643 06/26/18 2058 06/27/18 0733 06/27/18 1110  GLUCAP 126* 102* 128* 80 108*   Lipid Profile: No results for input(s): CHOL, HDL, LDLCALC, TRIG, CHOLHDL, LDLDIRECT in the last 72 hours. Thyroid Function Tests: No results for input(s): TSH, T4TOTAL, FREET4, T3FREE, THYROIDAB in the last 72 hours. Anemia Panel: No results for input(s): VITAMINB12, FOLATE, FERRITIN, TIBC, IRON, RETICCTPCT in the last 72 hours. Sepsis Labs: Recent Labs  Lab 06/24/18 0524 06/24/18 0804  PROCALCITON 0.47  --   LATICACIDVEN  --  2.2*    Recent Results (from the past 240 hour(s))  SARS Coronavirus 2     Status: None   Collection Time: 06/24/18  2:47 AM  Result Value Ref Range Status   SARS Coronavirus 2 NOT DETECTED NOT DETECTED Final    Comment: (NOTE) SARS-CoV-2 target nucleic acids are NOT DETECTED. The SARS-CoV-2 RNA is generally detectable in upper and lower respiratory specimens during the acute phase of infection.  Negative  results do not preclude SARS-CoV-2 infection, do not rule out co-infections with other pathogens, and should not be  used as the sole basis for treatment or other patient management decisions.  Negative results must be combined with clinical observations, patient history, and epidemiological information. The expected result is Not Detected. Fact Sheet for Patients: http://www.biofiredefense.com/wp-content/uploads/2020/03/BIOFIRE-COVID -19-patients.pdf Fact Sheet for Healthcare Providers: http://www.biofiredefense.com/wp-content/uploads/2020/03/BIOFIRE-COVID -19-hcp.pdf This test is not yet approved or cleared by the [place] and  has been authorized for detection and/or diagnosis of SARS-CoV-2 by FDA under an Emergency Use Authorization (EUA).  This EUA will remain in effec t (meaning this test can be used) for the duration of  the COVID-19 declaration under Section 564(b)(1) of the Act, 21  U.S.C. section 360bbb-3(b)(1), unless the authorization is terminated or revoked sooner. Performed at [facility], [place] [address], [place], [place] [address]   Culture, blood (routine x 2)     Status: None (Preliminary result)   Collection Time: 06/24/18  5:25 AM   Specimen: BLOOD LEFT HAND  Result Value Ref Range Status   Specimen Description BLOOD LEFT HAND  Final   Special Requests   Final    BOTTLES DRAWN AEROBIC ONLY Blood Culture adequate volume   Culture   Final    NO GROWTH 3 DAYS Performed at [facility], [place] [address] [place], [place] [address]    Report Status PENDING  Incomplete  Blood culture (routine x 2)     Status: None (Preliminary result)   Collection Time: 06/24/18  5:25 AM   Specimen: BLOOD  Result Value Ref Range Status   Specimen Description BLOOD RIGHT ANTECUBITAL  Final   Special Requests   Final    BOTTLES DRAWN AEROBIC AND ANAEROBIC Blood Culture adequate volume   Culture   Final    NO GROWTH 3 DAYS Performed at [facility], [place] [address] [place], [place] [address]    Report Status PENDING  Incomplete  Respiratory Panel by PCR     Status:  None   Collection Time: 06/24/18  5:39 AM   Specimen: Nasopharyngeal Swab; Respiratory  Result Value Ref Range Status   Adenovirus NOT DETECTED NOT DETECTED Final   Coronavirus 229E NOT DETECTED NOT DETECTED Final    Comment: (NOTE) The Coronavirus on the Respiratory Panel, DOES NOT test for the novel  Coronavirus (2019 nCoV)    Coronavirus HKU1 NOT DETECTED NOT DETECTED Final   Coronavirus NL63 NOT DETECTED NOT DETECTED Final   Coronavirus OC43 NOT DETECTED NOT DETECTED Final   Metapneumovirus NOT DETECTED NOT DETECTED Final   Rhinovirus / Enterovirus NOT DETECTED NOT DETECTED Final   Influenza A NOT DETECTED NOT DETECTED Final   Influenza B NOT DETECTED NOT DETECTED Final   Parainfluenza Virus 1 NOT DETECTED NOT DETECTED Final   Parainfluenza Virus 2 NOT DETECTED NOT DETECTED Final   Parainfluenza Virus 3 NOT DETECTED NOT DETECTED Final   Parainfluenza Virus 4 NOT DETECTED NOT DETECTED Final   Respiratory Syncytial Virus NOT DETECTED NOT DETECTED Final   Bordetella pertussis NOT DETECTED NOT DETECTED Final   Chlamydophila pneumoniae NOT DETECTED NOT DETECTED Final   Mycoplasma pneumoniae NOT DETECTED NOT DETECTED Final    Comment: Performed at [facility], [place]. [address] [place], [place] [address]  Culture, blood (routine x 2)     Status: None (Preliminary result)   Collection Time: 06/24/18  8:51 AM   Specimen: BLOOD  Result Value Ref Range Status   Specimen Description BLOOD SITE NOT SPECIFIED  Final   Special Requests   Final    BOTTLES DRAWN AEROBIC AND ANAEROBIC Blood Culture adequate volume   Culture   Final    NO GROWTH 3 DAYS Performed at [facility], [address] [place], [place] [address]    Report Status PENDING  Incomplete  MRSA PCR Screening     Status: None   Collection Time: 06/24/18  5:39 PM   Specimen: Nasal Mucosa; Nasopharyngeal  Result Value Ref Range Status   MRSA by PCR NEGATIVE NEGATIVE Final    Comment:        The GeneXpert MRSA  Assay (FDA approved for NASAL specimens only), is one component  of a comprehensive MRSA colonization surveillance program. It is not intended to diagnose MRSA infection nor to guide or monitor treatment for MRSA infections. Performed at [facility], [place] [address], [place], [place] [address]   Blood culture (routine x 2)     Status: None (Preliminary result)   Collection Time: 06/24/18  6:45 PM   Specimen: BLOOD RIGHT HAND  Result Value Ref Range Status   Specimen Description BLOOD RIGHT HAND  Final   Special Requests   Final    BOTTLES DRAWN AEROBIC ONLY Blood Culture adequate volume   Culture   Final    NO GROWTH 3 DAYS Performed at [facility], [place] [address] [place],  [address]    Report Status PENDING  Incomplete         Radiology Studies: Dg Chest Port 1 View  Result Date: 06/27/2018 CLINICAL DATA:  Respiratory failure. EXAM: PORTABLE CHEST 1 VIEW COMPARISON:  June 26, 2018 FINDINGS: No pneumothorax. Bilateral pulmonary opacities persist but are mildly improved. Stable cardiomegaly. No other changes. IMPRESSION: Mildly improved bilateral pulmonary opacities.  No other changes. Electronically Signed   By: Dorise Bullion III M.D   On: 06/27/2018 10:00   Dg Chest Port 1 View  Result Date: 06/26/2018 CLINICAL DATA:  Respiratory failure EXAM: PORTABLE CHEST 1 VIEW COMPARISON:  06/25/2018 FINDINGS: Interval removal of endotracheal and esophagogastric tubes. No change in heterogeneous airspace opacity of the right upper lobe and left mid lung. No new or focal airspace opacity. Unchanged gross cardiomegaly. IMPRESSION: Interval removal of endotracheal and esophagogastric tubes. No change in heterogeneous airspace opacity of the right upper lobe and left mid lung. No new or focal airspace opacity. Unchanged gross cardiomegaly. Electronically Signed   By: Eddie Candle M.D.   On: 06/26/2018 08:38        Scheduled Meds: . apixaban  2.5 mg Oral BID  .  digoxin  0.125 mg Oral Daily  . famotidine  20 mg Oral Q supper  . insulin aspart  0-9 Units Subcutaneous TID WC  . ipratropium-albuterol  3 mL Nebulization BID  . metoprolol tartrate  25 mg Oral BID   Continuous Infusions: . piperacillin-tazobactam (ZOSYN)  IV 3.375 g (06/27/18 1313)     LOS: 3 days     Cordelia Poche, MD [facility] 06/27/2018, 1:21 PM  If 7PM-7AM, please contact night-coverage [email or web address]

## 2018-06-27 NOTE — Progress Notes (Signed)
  Patient Saturations on Room Air at Rest = 92%  Patient Saturations on [employer] while Ambulating = 89%  Patient Saturations on 2 Liters of oxygen while Ambulating = 92%

## 2018-06-27 NOTE — Progress Notes (Signed)
Pharmacy Antibiotic Note  [name] is a 58 y.o. female admitted on 06/24/2018 with sepsis.  Pharmacy has been consulted for zosyn dosing. Patient is on D#4 antibiotic therapy. This AM, patient remains afebrile, WBC down from 22.2 to 19.3, SCr continues to increase, and is up to 2.20 (CrCl ~23 mL/min). Blood cultures remain negative  Plan: Continue Zosyn 3.375g IV q8h (4h infusion) Monitor clinical progress, c/s, renal function F/u plans for abx de-escalation/LOT   Height: 5\' 4"  (162.6 cm) Weight: 115 lb 1.6 oz (52.2 kg) IBW/kg (Calculated) : 54.7  Temp (24hrs), Avg:97.8 F (36.6 C), Min:97.4 F (36.3 C), Max:98.2 F (36.8 C)  Recent Labs  Lab 06/24/18 0233 06/24/18 0804 06/25/18 0500 06/26/18 0525 06/27/18 0356  WBC 9.3  --  13.4* 22.2* 19.3*  CREATININE 1.04*  --  1.40* 2.07* 2.20*  LATICACIDVEN  --  2.2*  --   --   --     Estimated Creatinine Clearance: 23 mL/min (A) (by C-G formula based on SCr of 2.2 mg/dL (H)).    Allergies  Allergen Reactions  . Bystolic [Nebivolol Hcl] Shortness Of Breath  . Doxycycline Shortness Of Breath  . Amoxicillin-Pot Clavulanate     REACTION: GI symptoms  . Amoxicillin-Pot Clavulanate Other (See Comments)    REACTION: GI symptoms  . Cardizem [Diltiazem Hcl] Other (See Comments)    HAs Pt can take Norvasc HAs Pt can take Norvasc  . Carvedilol Other (See Comments)    REACTION: tired REACTION: tired  . Cefdinir Diarrhea  . Cephalexin Diarrhea    REACTION: unspecified  . Hydrochlorothiazide Other (See Comments)    Dropped BP and caused hair loss   . Influenza Vaccines Other (See Comments)    Allergic to flu vaccinations per pt.  Soreness and "cold symptoms" per pt Allergic to flu vaccinations per pt.  Soreness and "cold symptoms" per pt  . Pneumovax [Pneumococcal Polysaccharide Vaccine]     ?reaction  . Prednisone Hypertension and Other (See Comments)  . Sulfamethoxazole     Other reaction(s): Unknown REACTION:  unspecified REACTION: unspecified  . Sulfasalazine     Other reaction(s): Unknown  . Sulfonamide Derivatives   . Verapamil Other (See Comments)    fatigue fatigue  . Eliquis [Apixaban] Rash    Antimicrobials this admission: 6/10 vancomycin >> 6/11 6/10 zosyn >>   Dose adjustments this admission: N/A  Microbiology results: 6/10 BCx: ng x 3 days 6/10 MRSA PCR: negative 6/10 Respiratory PCR: negative  Thank you for allowing pharmacy to be a part of this patient's care.  Leron Croak, PharmD PGY1 Pharmacy Resident Phone: [phone]  Please check AMION for all [place] phone numbers 06/27/2018 9:25 AM

## 2018-06-27 NOTE — Progress Notes (Signed)
Met with Thanh, RN and reports that MD inquired about outpt rehab since HH PT is not an option due to pt's insurance. Order for PT sent to [facility] Contacted Thanh, RN and made her aware of outpt rehab referral. 

## 2018-06-28 LAB — BASIC METABOLIC PANEL
Anion gap: 13 (ref 5–15)
BUN: 50 mg/dL — ABNORMAL HIGH (ref 6–20)
CO2: 22 mmol/L (ref 22–32)
Calcium: 9.1 mg/dL (ref 8.9–10.3)
Chloride: 103 mmol/L (ref 98–111)
Creatinine, Ser: 1.89 mg/dL — ABNORMAL HIGH (ref 0.44–1.00)
GFR calc Af Amer: 33 mL/min — ABNORMAL LOW (ref >60)
GFR calc non Af Amer: 29 mL/min — ABNORMAL LOW (ref >60)
Glucose, Bld: 99 mg/dL (ref 70–99)
Potassium: 3.5 mmol/L (ref 3.5–5.1)
Sodium: 138 mmol/L (ref 135–145)

## 2018-06-28 LAB — GLUCOSE, CAPILLARY
Glucose-Capillary: 99 mg/dL (ref 70–99)
Glucose-Capillary: 109 mg/dL — ABNORMAL HIGH (ref 70–99)

## 2018-06-28 MED ORDER — HYDROXYZINE HCL 50 MG/ML IM SOLN
50.0000 mg | Freq: Four times a day (QID) | INTRAMUSCULAR | Status: DC | PRN
  Filled 2018-06-28: qty 1

## 2018-06-28 MED ORDER — FAMOTIDINE 20 MG PO TABS: 20.0000 mg | ORAL_TABLET | Freq: Every day | ORAL | 0 refills | Status: DC

## 2018-06-28 MED ORDER — METOPROLOL TARTRATE 25 MG PO TABS: 12.5000 mg | ORAL_TABLET | Freq: Two times a day (BID) | ORAL | 0 refills | Status: DC

## 2018-06-28 MED ORDER — POTASSIUM CHLORIDE CRYS ER 20 MEQ PO TBCR
40.0000 meq | EXTENDED_RELEASE_TABLET | Freq: Once | ORAL | Status: AC
  Administered 2018-06-28: 40 meq via ORAL
  Filled 2018-06-28: qty 2

## 2018-06-28 MED ORDER — ALBUTEROL SULFATE (2.5 MG/3ML) 0.083% IN NEBU: 2.5000 mg | INHALATION_SOLUTION | RESPIRATORY_TRACT | 0 refills | Status: AC | PRN

## 2018-06-28 MED ORDER — HYDROXYZINE PAMOATE 50 MG PO CAPS: 50.0000 mg | ORAL_CAPSULE | Freq: Four times a day (QID) | ORAL | 0 refills | Status: DC | PRN

## 2018-06-28 MED ORDER — DIGOXIN 125 MCG PO TABS: 0.1250 mg | ORAL_TABLET | Freq: Every day | ORAL | 0 refills | Status: AC

## 2018-06-28 MED ORDER — METOPROLOL TARTRATE 12.5 MG HALF TABLET: 12.5000 mg | ORAL_TABLET | Freq: Two times a day (BID) | ORAL | Status: DC

## 2018-06-28 NOTE — Discharge Instructions (Signed)
[name],  You were in the hospital with trouble breathing. This may have been because of your lung disease vs pneumonia vs fluid in your chest. You were treated with antibiotics, some steroids and breathing treatments. You can go to the lung doctor in our system, or go to the physician you had planned on seeing last week.

## 2018-06-28 NOTE — Progress Notes (Signed)
SATURATION QUALIFICATIONS: (This note is used to comply with regulatory documentation for home oxygen)  Patient Saturations on Room Air at Rest = 97%  Patient Saturations on Room Air while Ambulating = 85%  Patient Saturations on 1 Liters of oxygen while Ambulating = 100%  Please briefly explain why patient needs home oxygen: Pt desated while ambulating on room air.

## 2018-06-28 NOTE — TOC Transition Note (Addendum)
Transition of Care [employer] [facility]) - CM/SW Discharge Note   Patient Details  Name: [name] MRN: [id number] Date of Birth: 1960-07-09  [facility] CM/SW Contact:  Claudie Leach, RN Phone Number: 06/28/2018, 2:32 PM      Final next level of care: Home/Self Care Barriers to Discharge: No Barriers Identified    Discharge Plan and Services In-house Referral: NA Discharge Planning Services: CM Consult Post Acute Care Choice: Home Health          DME Arranged: 3-N-1, Nebulizer machine, Tub bench, Oxygen, Walker rolling DME Agency: [employer] Date DME Agency Contacted: 06/28/18 Time DME Agency Contacted: [phone] Representative spoke with at DME Agency: Bertrum Sol

## 2018-06-28 NOTE — Discharge Summary (Signed)
Physician Discharge Summary  [name] PRF:[id number] DOB: 12/26/1960 DOA: 06/24/2018  PCP: Cassandria Anger, MD  Admit date: 06/24/2018 Discharge date: 06/28/2018  Admitted From: Home Disposition: Home  Recommendations for Outpatient Follow-up:  1. Follow up with PCP in 1 week 2. Follow up with a pulmonologist 3. Please obtain BMP/CBC in one week to follow-up renal function 4. Please follow up on the following pending results: None  Home Health: Outpatient PT Equipment/Devices: Nebulizer, shower seat, rolling walker, oxygen  Discharge Condition: Stable CODE STATUS: Full code Diet recommendation: Heart healthy   Brief/Interim Summary:  Admission HPI written by Shellia Cleverly, MD   History of present illness   HPI obtained from medical chart review as patient is intubated and sedated on mechanical ventilation.   58 year old female with history of Pulmonary sarcoidosis, asthma, atrial flutter on eliquis, HTN, anxiety, AAA, and PUD presenting to ER with severe respiratory distress.   She was seen at urgent care on 6/5 with two day history of SOB.  Declined steroids; COVID was negative.  CXR showed pulmonary insterstitial edema and likely right upper lobe pneumonia sent home on azithromax and albuterol.  Was seen in her primary office 6/9 with ongoing complaints of SOB and leg swelling sent home with several doses of lasix.   She is followed by [facility] and Dr. Lake Bells locally.    In ER, she was in severe respiratory distress, treated with duoneb, solumedrol, epi IM, and magnesium by EMS.  She was afebrile with blood pressure of 162/106, and 100% on NRB with some altered mental status.  Initially given ativan, lasix and NRB in the ER in attempts to hold off intubation, however she became more obtunded and required intubation with ABG of PH 6.989, pCO2 10, pO2 293.  Labs also noted for K 2.7, sCr 1.04, BNP 1614, troponin 0.03, WBC 9.3.  COVID negative.  CXR showed  worsening bilateral multifocal opacities.  EKG showed afib with nonspecific lateral [place] abnormalities. She was placed on propofol for sedation however had post-intubation  Hypotension and therefore switched to fentanyl drip and given 1L bolus, currently finishing with some mild improvement in blood pressure.   PCCM called for admission.    Hospital course:  Acute respiratory failure with hypoxia and hypercapnia Likely multifactorial in setting of possible pneumonia, sarcoid flare, pulmonary edema. Required mechanical ventilation from 6/10 to 6/11. Patient declining steroid therapy secondary to tachycardia. She has been on empiric vancomycin and Zosyn. Procalcitonin was elevated. Treated with 5 days of Zosyn. Weaned to room air but requires 1 L via nasal canula with ambulation.  Pulmonary/hepatic sarcoidosis Possible flare. She has been managed on steroids this hospitalization. Cardiac MRI negative for sarcoid. Patient declines steroid therapy.  Atrial fibrillation/flutter Patient with a history of Atrial fibrillation on Eliquis. Continued Digoxin and started on low dose metoprolol for development of tachycardia.  AKI on CKD stage III In setting of lasix. Lasix held with improvement of creatinine. Will need outpatient BMP.  Hypokalemia Resolved.  Hyperglycemia Hemoglobin A1C of 5.5%. in setting of steroids.  Leukocytosis Secondary to steroids. improved  History of PUD Continue Pepcid  Anxiety Started hydroxyzine  Discharge Diagnoses:  Active Problems:   Essential hypertension   Hyperglycemia   Atrial fibrillation ([place])   Acute respiratory failure [facility])    Discharge Instructions  Discharge Instructions    Ambulatory referral to Physical Therapy   Complete by: As directed    Call MD for:  difficulty breathing, headache  or visual disturbances   Complete by: As directed    Call MD for:  extreme fatigue   Complete by: As directed    Call MD for:  persistant  dizziness or light-headedness   Complete by: As directed    Increase activity slowly   Complete by: As directed      Allergies as of 06/28/2018      Reactions   Bystolic [nebivolol Hcl] Shortness Of Breath   Doxycycline Shortness Of Breath   Amoxicillin-pot Clavulanate    REACTION: GI symptoms   Amoxicillin-pot Clavulanate Other (See Comments)   REACTION: GI symptoms   Cardizem [diltiazem Hcl] Other (See Comments)   HAs Pt can take Norvasc HAs Pt can take Norvasc   Carvedilol Other (See Comments)   REACTION: tired REACTION: tired   Cefdinir Diarrhea   Cephalexin Diarrhea   REACTION: unspecified   Hydrochlorothiazide Other (See Comments)   Dropped BP and caused hair loss    Influenza Vaccines Other (See Comments)   Allergic to flu vaccinations per pt.  Soreness and "cold symptoms" per pt Allergic to flu vaccinations per pt.  Soreness and "cold symptoms" per pt   Pneumovax [pneumococcal Polysaccharide Vaccine]    ?reaction   Prednisone Hypertension, Other (See Comments)   Sulfamethoxazole    Other reaction(s): Unknown REACTION: unspecified REACTION: unspecified   Sulfasalazine    Other reaction(s): Unknown   Sulfonamide Derivatives    Verapamil Other (See Comments)   fatigue fatigue   Eliquis [apixaban] Rash      Medication List    STOP taking these medications   acyclovir 200 MG capsule Commonly known as: ZOVIRAX   amLODipine 5 MG tablet Commonly known as: NORVASC   azithromycin 250 MG tablet Commonly known as: ZITHROMAX   furosemide 20 MG tablet Commonly known as: LASIX   losartan 100 MG tablet Commonly known as: COZAAR   losartan 25 MG tablet Commonly known as: COZAAR     TAKE these medications   albuterol 108 (90 Base) MCG/ACT inhaler Commonly known as: VENTOLIN HFA Inhale 1 puff into the lungs every 6 (six) hours as needed for shortness of breath. What changed: Another medication with the same name was added. Make sure you understand how and  when to take each.   albuterol (2.5 MG/3ML) 0.083% nebulizer solution Commonly known as: PROVENTIL Take 3 mLs (2.5 mg total) by nebulization every 4 (four) hours as needed for wheezing or shortness of breath. What changed: You were already taking a medication with the same name, and this prescription was added. Make sure you understand how and when to take each.   digoxin 0.125 MG tablet Commonly known as: LANOXIN Take 1 tablet (0.125 mg total) by mouth daily.   Eliquis 2.5 MG Tabs tablet Generic drug: apixaban Take 2.5 mg by mouth 2 (two) times daily.   famotidine 20 MG tablet Commonly known as: PEPCID Take 1 tablet (20 mg total) by mouth daily with supper.   fexofenadine 180 MG tablet Commonly known as: ALLEGRA Take 180 mg by mouth daily.   HAIR SKIN & NAILS ADVANCED PO Take 2 capsules by mouth daily.   hydrOXYzine 50 MG capsule Commonly known as: Vistaril Take 1 capsule (50 mg total) by mouth every 6 (six) hours as needed for anxiety.   metoprolol tartrate 25 MG tablet Commonly known as: LOPRESSOR Take 0.5 tablets (12.5 mg total) by mouth 2 (two) times daily.   Vitamin B12 500 MCG Tabs Take 500 mcg by mouth daily. Reported  on 06/20/2015            Durable Medical Equipment  (From admission, onward)         Start     Ordered   06/28/18 1259  For home use only DME Walker rolling  Once    Question Answer Comment  Patient needs a walker to treat with the following condition ILD (interstitial lung disease) ([place])   Patient needs a walker to treat with the following condition Acute respiratory failure with hypoxia ([place])      06/28/18 1258   06/28/18 1138  For home use only DME oxygen  Once    Question Answer Comment  Length of Need 6 Months   Mode or (Route) Nasal cannula   Liters per Minute 1   Frequency Continuous (stationary and portable oxygen unit needed)   Oxygen delivery system Gas      06/28/18 1137   06/28/18 1136  For home use only DME Shower stool   Once     06/28/18 1136   06/28/18 1135  For home use only DME Nebulizer machine  Once    Question:  Patient needs a nebulizer to treat with the following condition  Answer:  ILD (interstitial lung disease) ([place])   06/28/18 1136         Follow-up Information    Fenton Foy, NP Follow up on 07/10/2018.   Specialty: Pulmonary Disease Why:  10:30 am for hospital follow up Please arrive 15 minutes early Contact information: [place] [address] [phone]        [facility] Follow up.   Specialty: Rehabilitation Contact information: [address] [id number] [place] [address] [phone]         Allergies  Allergen Reactions   Bystolic [Nebivolol Hcl] Shortness Of Breath   Doxycycline Shortness Of Breath   Amoxicillin-Pot Clavulanate     REACTION: GI symptoms   Amoxicillin-Pot Clavulanate Other (See Comments)    REACTION: GI symptoms   Cardizem [Diltiazem Hcl] Other (See Comments)    HAs Pt can take Norvasc HAs Pt can take Norvasc   Carvedilol Other (See Comments)    REACTION: tired REACTION: tired   Cefdinir Diarrhea   Cephalexin Diarrhea    REACTION: unspecified   Hydrochlorothiazide Other (See Comments)    Dropped BP and caused hair loss    Influenza Vaccines Other (See Comments)    Allergic to flu vaccinations per pt.  Soreness and "cold symptoms" per pt Allergic to flu vaccinations per pt.  Soreness and "cold symptoms" per pt   Pneumovax [Pneumococcal Polysaccharide Vaccine]     ?reaction   Prednisone Hypertension and Other (See Comments)   Sulfamethoxazole     Other reaction(s): Unknown REACTION: unspecified REACTION: unspecified   Sulfasalazine     Other reaction(s): Unknown   Sulfonamide Derivatives    Verapamil Other (See Comments)    fatigue fatigue   Eliquis [Apixaban] Rash    Consultations:  PCCM   Procedures/Studies: Ct  Chest Wo Contrast  Result Date: 06/24/2018 CLINICAL DATA:  Possible acute on chronic infiltrate EXAM: CT CHEST WITHOUT CONTRAST TECHNIQUE: Multidetector CT imaging of the chest was performed following the standard protocol without IV contrast. COMPARISON:  12/16/2016, plain film from earlier in the same day. FINDINGS: Cardiovascular: Atherosclerotic calcifications are identified. No significant cardiac enlargement is noted. No coronary calcifications are seen. Mediastinum/Nodes: Endotracheal tube and gastric catheter are noted in satisfactory position. Lack of IV  contrast limits evaluation for adenopathy. Left subclavian central venous line is noted at the confluence of the innominate veins. Esophagus as visualized is within normal limits. Thoracic inlet is unremarkable. Lungs/Pleura: Central perihilar scarring is again identified consistent with the given clinical history of sarcoidosis. Some patchy reticulonodular infiltrate is noted particularly within the right upper lung which may be related to progression of sarcoid although the possibility of acute infiltrate deserves consideration as well. Additionally there is some ground-glass density in the bases bilaterally consistent with atypical pneumonia. No sizable effusion is noted. Or marked nodularity is noted in the lower lobe measuring up to 8 mm. These are likely postop in inflammatory in nature. Upper Abdomen: Small nonobstructing renal stones are noted bilaterally. The upper abdomen is otherwise within normal limits. Musculoskeletal: No chest wall mass or suspicious bone lesions identified. IMPRESSION: Chronic changes consistent with sarcoidosis. Increase in reticulonodular infiltrate particularly in the right lung which may represent some interval progression of sarcoidosis. Ground-glass infiltrate in the lower lobes bilaterally right greater than left with associated nodularity consistent with atypical pneumonia. No sizable effusion is noted.  Electronically Signed   By: Inez Catalina M.D.   On: 06/24/2018 07:29   Dg Chest Port 1 View  Result Date: 06/27/2018 CLINICAL DATA:  Respiratory failure. EXAM: PORTABLE CHEST 1 VIEW COMPARISON:  June 26, 2018 FINDINGS: No pneumothorax. Bilateral pulmonary opacities persist but are mildly improved. Stable cardiomegaly. No other changes. IMPRESSION: Mildly improved bilateral pulmonary opacities.  No other changes. Electronically Signed   By: Dorise Bullion III M.D   On: 06/27/2018 10:00   Dg Chest Port 1 View  Result Date: 06/26/2018 CLINICAL DATA:  Respiratory failure EXAM: PORTABLE CHEST 1 VIEW COMPARISON:  06/25/2018 FINDINGS: Interval removal of endotracheal and esophagogastric tubes. No change in heterogeneous airspace opacity of the right upper lobe and left mid lung. No new or focal airspace opacity. Unchanged gross cardiomegaly. IMPRESSION: Interval removal of endotracheal and esophagogastric tubes. No change in heterogeneous airspace opacity of the right upper lobe and left mid lung. No new or focal airspace opacity. Unchanged gross cardiomegaly. Electronically Signed   By: Eddie Candle M.D.   On: 06/26/2018 08:38   Dg Chest Port 1 View  Result Date: 06/25/2018 CLINICAL DATA:  Respiratory failure EXAM: PORTABLE CHEST 1 VIEW COMPARISON:  Chest radiograph from one day prior. FINDINGS: Endotracheal tube tip is 2.2 cm above the carina. Enteric tube enters stomach with the tip not seen on this image. Left subclavian central venous catheter terminates in the left brachiocephalic vein near the junction with the SVC. Stable cardiomediastinal silhouette with mild cardiomegaly. No pneumothorax. No pleural effusion. Patchy opacity throughout the right greater than left lungs, predominantly perihilar, similar. IMPRESSION: 1. Well-positioned support structures. 2. Stable cardiomegaly and patchy opacity throughout the right greater than left lungs, predominantly perihilar. Electronically Signed   By: Ilona Sorrel M.D.   On: 06/25/2018 08:35   Dg Chest Port 1 View  Result Date: 06/24/2018 CLINICAL DATA:  Central line repositioning EXAM: PORTABLE CHEST 1 VIEW COMPARISON:  Film from earlier in the same day. FINDINGS: Cardiac shadow is stable. Endotracheal tube and gastric catheter are again noted and stable. Bilateral infiltrates are again seen right greater than left. The left subclavian catheter has been withdrawn and the tip now lies at the confluence of the innominate veins. Again no pneumothorax is noted. IMPRESSION: No pneumothorax. Left subclavian central line is been reposition as described. Electronically Signed   By: Linus Mako.D.  On: 06/24/2018 07:00   Dg Chest Portable 1 View  Result Date: 06/24/2018 CLINICAL DATA:  Central line placement EXAM: PORTABLE CHEST 1 VIEW COMPARISON:  Film from earlier in the same day. FINDINGS: Cardiac shadow is stable. Endotracheal tube and gastric catheter are noted in satisfactory position. New left subclavian central line is noted with the catheter tip in the right innominate vein. No pneumothorax is noted. Patchy infiltrates are noted bilaterally right greater than left similar to that seen on the prior exam. IMPRESSION: No pneumothorax following central line placement. Left subclavian catheter tip is noted within the right innominate vein. Bilateral infiltrates right greater than left Electronically Signed   By: Inez Catalina M.D.   On: 06/24/2018 06:59   Dg Chest Portable 1 View  Result Date: 06/24/2018 CLINICAL DATA:  Post intubation. EXAM: PORTABLE CHEST 1 VIEW COMPARISON:  06/24/2018 FINDINGS: The endotracheal tube terminates in the right mainstem bronchus. The enteric tube terminates below the left hemidiaphragm. Again identified are diffuse bilateral airspace opacities similar to prior study. There is no pneumothorax, however the right costophrenic angle is not visualized. There is no acute osseous abnormality. IMPRESSION: 1. Endotracheal tube terminates  in the right mainstem bronchus. Repositioning is recommended. 2. OG tube terminates below the left hemidiaphragm. The tip is not visualized on this exam. 3. Stable cardiac enlargement. 4. Persistent diffuse bilateral airspace opacities similar to prior study. Endotracheal tube positioning was discussed with RN Baliff by Dr. Nyoka Cowden at approximately 357 a.m. on 06/24/2018. Electronically Signed   By: Constance Holster M.D.   On: 06/24/2018 03:59   Dg Chest Portable 1 View  Result Date: 06/24/2018 CLINICAL DATA:  Respiratory distress. EXAM: PORTABLE CHEST 1 VIEW COMPARISON:  12/17/2016 FINDINGS: Heart size is enlarged. There are diffuse bilateral hazy airspace opacities. There are prominent interstitial lung markings. There is a small right-sided pleural effusion. There may be a trace left-sided pleural effusion. There is no pneumothorax. No displaced fracture. IMPRESSION: 1. Cardiomegaly. 2. Worsening bilateral multifocal airspace opacities may represent progressive sarcoidosis, pulmonary edema, or an atypical infectious process. 3. There is a small right-sided pleural effusion and likely a trace left-sided pleural effusion. Electronically Signed   By: Constance Holster M.D.   On: 06/24/2018 03:14     Subjective: Feels fatigued today. No other issues.  Discharge Exam: Vitals:   06/28/18 0835 06/28/18 1037  BP: (!) 127/95   Pulse: 71   Resp:    Temp:    SpO2:  98%   Vitals:   06/28/18 0305 06/28/18 0615 06/28/18 0835 06/28/18 1037  BP: (!) 129/106 (!) 122/99 (!) 127/95   Pulse: (!) 133 64 71   Resp: 20 20    Temp:  (!) 97.4 F (36.3 C)    TempSrc:  Oral    SpO2: 99% 97%  98%  Weight:  52.6 kg    Height:        General: Pt is alert, awake, not in acute distress Cardiovascular: RRR, S1/S2 +, no rubs, no gallops Respiratory: CTA bilaterally, no wheezing, no rhonchi Abdominal: Soft, NT, ND, bowel sounds + Extremities: LE 2+ edema of the ankles, no cyanosis    The results of  significant diagnostics from this hospitalization (including imaging, microbiology, ancillary and laboratory) are listed below for reference.     Microbiology: Recent Results (from the past 240 hour(s))  SARS Coronavirus 2     Status: None   Collection Time: 06/24/18  2:47 AM  Result Value Ref Range Status   SARS  Coronavirus 2 NOT DETECTED NOT DETECTED Final    Comment: (NOTE) SARS-CoV-2 target nucleic acids are NOT DETECTED. The SARS-CoV-2 RNA is generally detectable in upper and lower respiratory specimens during the acute phase of infection.  Negative  results do not preclude SARS-CoV-2 infection, do not rule out co-infections with other pathogens, and should not be used as the sole basis for treatment or other patient management decisions.  Negative results must be combined with clinical observations, patient history, and epidemiological information. The expected result is Not Detected. Fact Sheet for Patients: http://www.biofiredefense.com/wp-content/uploads/2020/03/BIOFIRE-COVID -19-patients.pdf Fact Sheet for Healthcare Providers: http://www.biofiredefense.com/wp-content/uploads/2020/03/BIOFIRE-COVID -19-hcp.pdf This test is not yet approved or cleared by the [place] and  has been authorized for detection and/or diagnosis of SARS-CoV-2 by FDA under an Emergency Use Authorization (EUA).  This EUA will remain in effec t (meaning this test can be used) for the duration of  the COVID-19 declaration under Section 564(b)(1) of the Act, 21 U.S.C. section 360bbb-3(b)(1), unless the authorization is terminated or revoked sooner. Performed at [facility], [place] [address] [place], [place] [address]   Culture, blood (routine x 2)     Status: None (Preliminary result)   Collection Time: 06/24/18  5:25 AM   Specimen: BLOOD LEFT HAND  Result Value Ref Range Status   Specimen Description BLOOD LEFT HAND  Final   Special Requests   Final    BOTTLES DRAWN AEROBIC ONLY  Blood Culture adequate volume   Culture   Final    NO GROWTH 4 DAYS Performed at [facility], [place] [address], [place], [place] [address]    Report Status PENDING  Incomplete  Blood culture (routine x 2)     Status: None (Preliminary result)   Collection Time: 06/24/18  5:25 AM   Specimen: BLOOD  Result Value Ref Range Status   Specimen Description BLOOD RIGHT ANTECUBITAL  Final   Special Requests   Final    BOTTLES DRAWN AEROBIC AND ANAEROBIC Blood Culture adequate volume   Culture   Final    NO GROWTH 4 DAYS Performed at [facility], [place] [address], [place], [place] [address]    Report Status PENDING  Incomplete  Respiratory Panel by PCR     Status: None   Collection Time: 06/24/18  5:39 AM   Specimen: Nasopharyngeal Swab; Respiratory  Result Value Ref Range Status   Adenovirus NOT DETECTED NOT DETECTED Final   Coronavirus 229E NOT DETECTED NOT DETECTED Final    Comment: (NOTE) The Coronavirus on the Respiratory Panel, DOES NOT test for the novel  Coronavirus (2019 nCoV)    Coronavirus HKU1 NOT DETECTED NOT DETECTED Final   Coronavirus NL63 NOT DETECTED NOT DETECTED Final   Coronavirus OC43 NOT DETECTED NOT DETECTED Final   Metapneumovirus NOT DETECTED NOT DETECTED Final   Rhinovirus / Enterovirus NOT DETECTED NOT DETECTED Final   Influenza A NOT DETECTED NOT DETECTED Final   Influenza B NOT DETECTED NOT DETECTED Final   Parainfluenza Virus 1 NOT DETECTED NOT DETECTED Final   Parainfluenza Virus 2 NOT DETECTED NOT DETECTED Final   Parainfluenza Virus 3 NOT DETECTED NOT DETECTED Final   Parainfluenza Virus 4 NOT DETECTED NOT DETECTED Final   Respiratory Syncytial Virus NOT DETECTED NOT DETECTED Final   Bordetella pertussis NOT DETECTED NOT DETECTED Final   Chlamydophila pneumoniae NOT DETECTED NOT DETECTED Final   Mycoplasma pneumoniae NOT DETECTED NOT DETECTED Final    Comment: Performed at [facility], [address] [place], [place] [address]    Culture, blood (routine x 2)     Status: None (Preliminary result)   Collection Time: 06/24/18  8:51 AM   Specimen: BLOOD  Result Value Ref Range Status   Specimen Description BLOOD SITE NOT SPECIFIED  Final   Special Requests   Final    BOTTLES DRAWN AEROBIC AND ANAEROBIC Blood Culture adequate volume   Culture   Final    NO GROWTH 4 DAYS Performed at [facility], [place] [address], [place], [place] [address]    Report Status PENDING  Incomplete  MRSA PCR Screening     Status: None   Collection Time: 06/24/18  5:39 PM   Specimen: Nasal Mucosa; Nasopharyngeal  Result Value Ref Range Status   MRSA by PCR NEGATIVE NEGATIVE Final    Comment:        The GeneXpert MRSA Assay (FDA approved for NASAL specimens only), is one component of a comprehensive MRSA colonization surveillance program. It is not intended to diagnose MRSA infection nor to guide or monitor treatment for MRSA infections. Performed at [facility], [place] [address] [place], [place] [address]   Blood culture (routine x 2)     Status: None (Preliminary result)   Collection Time: 06/24/18  6:45 PM   Specimen: BLOOD RIGHT HAND  Result Value Ref Range Status   Specimen Description BLOOD RIGHT HAND  Final   Special Requests   Final    BOTTLES DRAWN AEROBIC ONLY Blood Culture adequate volume   Culture   Final    NO GROWTH 4 DAYS Performed at [facility], [place] [address], [place], [place] [address]    Report Status PENDING  Incomplete     Labs: BNP (last 3 results) Recent Labs    06/24/18 0233  BNP 6,295.2*   Basic Metabolic Panel: Recent Labs  Lab 06/24/18 0524  06/25/18 0500 06/26/18 0525 06/27/18 0356 06/27/18 1546 06/28/18 0714  NA  --    < > 143 142 138 136 138  K  --    < > 3.2* 4.4 4.1 3.6 3.5  CL  --   --  108 108 106 106 103  CO2  --   --  23 21* 18* 19* 22  GLUCOSE  --   --  191* 134* 102* 93 99  BUN  --   --  24* 47* 58* 56* 50*  CREATININE  --   --  1.40* 2.07* 2.20*  2.12* 1.89*  CALCIUM  --   --  9.0 9.2 9.2 8.7* 9.1  MG 2.2  --  1.9 2.3  --   --   --   PHOS 4.8*  --  3.5 4.0  --   --   --    < > = values in this interval not displayed.   Liver Function Tests: Recent Labs  Lab 06/24/18 0233  AST 46*  ALT 34  ALKPHOS 94  BILITOT 1.3*  PROT 5.7*  ALBUMIN 2.6*   No results for input(s): LIPASE, AMYLASE in the last 168 hours. No results for input(s): AMMONIA in the last 168 hours. CBC: Recent Labs  Lab 06/24/18 0233 06/24/18 0438 06/24/18 1030 06/25/18 0500 06/26/18 0525 06/27/18 0356  WBC 9.3  --   --  13.4* 22.2* 19.3*  NEUTROABS 4.3  --   --   --   --   --   HGB 11.3* 11.9* 12.9 12.2 12.4 12.8  HCT  37.0 35.0* 38.0 37.5 38.2 39.2  MCV 100.0  --   --  92.6 93.4 91.8  PLT PLATELET CLUMPS NOTED ON SMEAR, UNABLE TO ESTIMATE  --   --  207 246 250   Cardiac Enzymes: Recent Labs  Lab 06/24/18 0233 06/24/18 0804 06/24/18 1845  TROPONINI 0.03* 0.04* <0.03   BNP: Invalid input(s): POCBNP CBG: Recent Labs  Lab 06/27/18 1110 06/27/18 1633 06/27/18 2156 06/28/18 0730 06/28/18 1123  GLUCAP 108* 86 104* 99 109*   D-Dimer No results for input(s): DDIMER in the last 72 hours. Hgb A1c Recent Labs    06/26/18 0525  HGBA1C 5.5   Lipid Profile No results for input(s): CHOL, HDL, LDLCALC, TRIG, CHOLHDL, LDLDIRECT in the last 72 hours. Thyroid function studies No results for input(s): TSH, T4TOTAL, T3FREE, THYROIDAB in the last 72 hours.  Invalid input(s): FREET3 Anemia work up No results for input(s): VITAMINB12, FOLATE, FERRITIN, TIBC, IRON, RETICCTPCT in the last 72 hours. Urinalysis    Component Value Date/Time   COLORURINE YELLOW 06/27/2018 1010   APPEARANCEUR CLEAR 06/27/2018 1010   LABSPEC 1.019 06/27/2018 1010   PHURINE 5.0 06/27/2018 1010   GLUCOSEU NEGATIVE 06/27/2018 1010   GLUCOSEU NEGATIVE 05/29/2016 0844   HGBUR SMALL (A) 06/27/2018 1010   BILIRUBINUR NEGATIVE 06/27/2018 1010   BILIRUBINUR neg 04/27/2016  1106   KETONESUR NEGATIVE 06/27/2018 1010   PROTEINUR 30 (A) 06/27/2018 1010   UROBILINOGEN 0.2 05/29/2016 0844   NITRITE NEGATIVE 06/27/2018 1010   LEUKOCYTESUR NEGATIVE 06/27/2018 1010   Sepsis Labs Invalid input(s): PROCALCITONIN,  WBC,  LACTICIDVEN Microbiology Recent Results (from the past 240 hour(s))  SARS Coronavirus 2     Status: None   Collection Time: 06/24/18  2:47 AM  Result Value Ref Range Status   SARS Coronavirus 2 NOT DETECTED NOT DETECTED Final    Comment: (NOTE) SARS-CoV-2 target nucleic acids are NOT DETECTED. The SARS-CoV-2 RNA is generally detectable in upper and lower respiratory specimens during the acute phase of infection.  Negative  results do not preclude SARS-CoV-2 infection, do not rule out co-infections with other pathogens, and should not be used as the sole basis for treatment or other patient management decisions.  Negative results must be combined with clinical observations, patient history, and epidemiological information. The expected result is Not Detected. Fact Sheet for Patients: http://www.biofiredefense.com/wp-content/uploads/2020/03/BIOFIRE-COVID -19-patients.pdf Fact Sheet for Healthcare Providers: http://www.biofiredefense.com/wp-content/uploads/2020/03/BIOFIRE-COVID -19-hcp.pdf This test is not yet approved or cleared by the [place] and  has been authorized for detection and/or diagnosis of SARS-CoV-2 by FDA under an Emergency Use Authorization (EUA).  This EUA will remain in effec t (meaning this test can be used) for the duration of  the COVID-19 declaration under Section 564(b)(1) of the Act, 21 U.S.C. section 360bbb-3(b)(1), unless the authorization is terminated or revoked sooner. Performed at [facility], [place] [address] [place], [place] [address]   Culture, blood (routine x 2)     Status: None (Preliminary result)   Collection Time: 06/24/18  5:25 AM   Specimen: BLOOD LEFT HAND  Result Value Ref Range  Status   Specimen Description BLOOD LEFT HAND  Final   Special Requests   Final    BOTTLES DRAWN AEROBIC ONLY Blood Culture adequate volume   Culture   Final    NO GROWTH 4 DAYS Performed at [facility], [place] [address], [place], [place] [address]    Report Status PENDING  Incomplete  Blood culture (routine x 2)  Status: None (Preliminary result)   Collection Time: 06/24/18  5:25 AM   Specimen: BLOOD  Result Value Ref Range Status   Specimen Description BLOOD RIGHT ANTECUBITAL  Final   Special Requests   Final    BOTTLES DRAWN AEROBIC AND ANAEROBIC Blood Culture adequate volume   Culture   Final    NO GROWTH 4 DAYS Performed at [facility], [address] [place], [place] [address]    Report Status PENDING  Incomplete  Respiratory Panel by PCR     Status: None   Collection Time: 06/24/18  5:39 AM   Specimen: Nasopharyngeal Swab; Respiratory  Result Value Ref Range Status   Adenovirus NOT DETECTED NOT DETECTED Final   Coronavirus 229E NOT DETECTED NOT DETECTED Final    Comment: (NOTE) The Coronavirus on the Respiratory Panel, DOES NOT test for the novel  Coronavirus (2019 nCoV)    Coronavirus HKU1 NOT DETECTED NOT DETECTED Final   Coronavirus NL63 NOT DETECTED NOT DETECTED Final   Coronavirus OC43 NOT DETECTED NOT DETECTED Final   Metapneumovirus NOT DETECTED NOT DETECTED Final   Rhinovirus / Enterovirus NOT DETECTED NOT DETECTED Final   Influenza A NOT DETECTED NOT DETECTED Final   Influenza B NOT DETECTED NOT DETECTED Final   Parainfluenza Virus 1 NOT DETECTED NOT DETECTED Final   Parainfluenza Virus 2 NOT DETECTED NOT DETECTED Final   Parainfluenza Virus 3 NOT DETECTED NOT DETECTED Final   Parainfluenza Virus 4 NOT DETECTED NOT DETECTED Final   Respiratory Syncytial Virus NOT DETECTED NOT DETECTED Final   Bordetella pertussis NOT DETECTED NOT DETECTED Final   Chlamydophila pneumoniae NOT DETECTED NOT DETECTED Final   Mycoplasma pneumoniae NOT DETECTED  NOT DETECTED Final    Comment: Performed at [facility], [place]. [address] [place], [place] [address]  Culture, blood (routine x 2)     Status: None (Preliminary result)   Collection Time: 06/24/18  8:51 AM   Specimen: BLOOD  Result Value Ref Range Status   Specimen Description BLOOD SITE NOT SPECIFIED  Final   Special Requests   Final    BOTTLES DRAWN AEROBIC AND ANAEROBIC Blood Culture adequate volume   Culture   Final    NO GROWTH 4 DAYS Performed at [facility], [address], [place], [place] [address]    Report Status PENDING  Incomplete  MRSA PCR Screening     Status: None   Collection Time: 06/24/18  5:39 PM   Specimen: Nasal Mucosa; Nasopharyngeal  Result Value Ref Range Status   MRSA by PCR NEGATIVE NEGATIVE Final    Comment:        The GeneXpert MRSA Assay (FDA approved for NASAL specimens only), is one component of a comprehensive MRSA colonization surveillance program. It is not intended to diagnose MRSA infection nor to guide or monitor treatment for MRSA infections. Performed at [facility], [place] [address], [place], [place] [address]   Blood culture (routine x 2)     Status: None (Preliminary result)   Collection Time: 06/24/18  6:45 PM   Specimen: BLOOD RIGHT HAND  Result Value Ref Range Status   Specimen Description BLOOD RIGHT HAND  Final   Special Requests   Final    BOTTLES DRAWN AEROBIC ONLY Blood Culture adequate volume   Culture   Final    NO GROWTH 4 DAYS Performed at [facility], [place] [address] [place], [place] [address]    Report Status PENDING  Incomplete    SIGNED:   Cordelia Poche, MD [facility] 06/28/2018, 12:55 PM

## 2018-06-29 ENCOUNTER — Telehealth: Payer: Self-pay | Admitting: Physical Therapy

## 2018-06-29 NOTE — Telephone Encounter (Signed)
Spoke with patient regarding her referral diagnosis of "acute respiratory failure with hypercapnia"    This is not an appropriate diagnosis for OP rehab and asked the pt to contact her PCP to determine need for cardiac vs ortho rehab. Pt verbalized understanding and left our phone number to contact us with any questions.  Shatoya Roets C. Venita Seng PT, DPT 06/29/18 10:50 AM

## 2018-06-30 LAB — CULTURE, BLOOD (ROUTINE X 2)
Culture: NO GROWTH
Culture: NO GROWTH
Culture: NO GROWTH
Culture: NO GROWTH
Special Requests: ADEQUATE
Special Requests: ADEQUATE
Special Requests: ADEQUATE
Special Requests: ADEQUATE

## 2018-07-03 MED FILL — Succinylcholine Chloride Inj 20 MG/ML: INTRAMUSCULAR | Qty: 5 | Status: AC

## 2018-07-10 ENCOUNTER — Inpatient Hospital Stay: Payer: BLUE CROSS/BLUE SHIELD | Admitting: Nurse Practitioner

## 2018-07-11 ENCOUNTER — Other Ambulatory Visit: Payer: Self-pay

## 2018-07-11 ENCOUNTER — Emergency Department (HOSPITAL_COMMUNITY): Payer: BLUE CROSS/BLUE SHIELD

## 2018-07-11 ENCOUNTER — Inpatient Hospital Stay (HOSPITAL_COMMUNITY): Payer: BLUE CROSS/BLUE SHIELD

## 2018-07-11 ENCOUNTER — Inpatient Hospital Stay (HOSPITAL_COMMUNITY)
Admission: EM | Admit: 2018-07-11 | Discharge: 2018-07-14 | DRG: 196 | Disposition: A | Discharge status: Home / Self Care | Payer: BLUE CROSS/BLUE SHIELD | Attending: Nephrology | Admitting: Nephrology

## 2018-07-11 ENCOUNTER — Encounter (HOSPITAL_COMMUNITY): Payer: Self-pay | Admitting: Emergency Medicine

## 2018-07-11 DIAGNOSIS — I130 Hypertensive heart and chronic kidney disease with heart failure and stage 1 through stage 4 chronic kidney disease, or unspecified chronic kidney disease: Secondary | ICD-10-CM | POA: Diagnosis present

## 2018-07-11 DIAGNOSIS — I714 Abdominal aortic aneurysm, without rupture: Secondary | ICD-10-CM | POA: Diagnosis present

## 2018-07-11 DIAGNOSIS — J9601 Acute respiratory failure with hypoxia: Secondary | ICD-10-CM | POA: Diagnosis not present

## 2018-07-11 DIAGNOSIS — Z9114 Patient's other noncompliance with medication regimen: Secondary | ICD-10-CM

## 2018-07-11 DIAGNOSIS — J9600 Acute respiratory failure, unspecified whether with hypoxia or hypercapnia: Secondary | ICD-10-CM | POA: Diagnosis not present

## 2018-07-11 DIAGNOSIS — H409 Unspecified glaucoma: Secondary | ICD-10-CM | POA: Diagnosis present

## 2018-07-11 DIAGNOSIS — J9622 Acute and chronic respiratory failure with hypercapnia: Secondary | ICD-10-CM | POA: Diagnosis present

## 2018-07-11 DIAGNOSIS — E872 Acidosis, unspecified: Secondary | ICD-10-CM | POA: Diagnosis present

## 2018-07-11 DIAGNOSIS — N183 Chronic kidney disease, stage 3 unspecified: Secondary | ICD-10-CM | POA: Diagnosis present

## 2018-07-11 DIAGNOSIS — J9621 Acute and chronic respiratory failure with hypoxia: Secondary | ICD-10-CM | POA: Diagnosis present

## 2018-07-11 DIAGNOSIS — D860 Sarcoidosis of lung: Principal | ICD-10-CM | POA: Diagnosis present

## 2018-07-11 DIAGNOSIS — Z880 Allergy status to penicillin: Secondary | ICD-10-CM

## 2018-07-11 DIAGNOSIS — J441 Chronic obstructive pulmonary disease with (acute) exacerbation: Secondary | ICD-10-CM | POA: Diagnosis present

## 2018-07-11 DIAGNOSIS — Z7901 Long term (current) use of anticoagulants: Secondary | ICD-10-CM

## 2018-07-11 DIAGNOSIS — E1165 Type 2 diabetes mellitus with hyperglycemia: Secondary | ICD-10-CM | POA: Diagnosis present

## 2018-07-11 DIAGNOSIS — F329 Major depressive disorder, single episode, unspecified: Secondary | ICD-10-CM | POA: Diagnosis present

## 2018-07-11 DIAGNOSIS — Z9981 Dependence on supplemental oxygen: Secondary | ICD-10-CM

## 2018-07-11 DIAGNOSIS — Z823 Family history of stroke: Secondary | ICD-10-CM

## 2018-07-11 DIAGNOSIS — E119 Type 2 diabetes mellitus without complications: Secondary | ICD-10-CM

## 2018-07-11 DIAGNOSIS — E1122 Type 2 diabetes mellitus with diabetic chronic kidney disease: Secondary | ICD-10-CM | POA: Diagnosis present

## 2018-07-11 DIAGNOSIS — J9602 Acute respiratory failure with hypercapnia: Secondary | ICD-10-CM | POA: Diagnosis not present

## 2018-07-11 DIAGNOSIS — J9819 Other pulmonary collapse: Secondary | ICD-10-CM | POA: Diagnosis present

## 2018-07-11 DIAGNOSIS — I493 Ventricular premature depolarization: Secondary | ICD-10-CM | POA: Diagnosis present

## 2018-07-11 DIAGNOSIS — E876 Hypokalemia: Secondary | ICD-10-CM | POA: Diagnosis present

## 2018-07-11 DIAGNOSIS — F419 Anxiety disorder, unspecified: Secondary | ICD-10-CM | POA: Diagnosis present

## 2018-07-11 DIAGNOSIS — E874 Mixed disorder of acid-base balance: Secondary | ICD-10-CM | POA: Diagnosis present

## 2018-07-11 DIAGNOSIS — N179 Acute kidney failure, unspecified: Secondary | ICD-10-CM | POA: Diagnosis present

## 2018-07-11 DIAGNOSIS — I484 Atypical atrial flutter: Secondary | ICD-10-CM | POA: Diagnosis not present

## 2018-07-11 DIAGNOSIS — I483 Typical atrial flutter: Secondary | ICD-10-CM | POA: Diagnosis not present

## 2018-07-11 DIAGNOSIS — Z801 Family history of malignant neoplasm of trachea, bronchus and lung: Secondary | ICD-10-CM

## 2018-07-11 DIAGNOSIS — I4891 Unspecified atrial fibrillation: Secondary | ICD-10-CM | POA: Diagnosis present

## 2018-07-11 DIAGNOSIS — D8685 Sarcoid myocarditis: Secondary | ICD-10-CM | POA: Diagnosis present

## 2018-07-11 DIAGNOSIS — R609 Edema, unspecified: Secondary | ICD-10-CM | POA: Diagnosis not present

## 2018-07-11 DIAGNOSIS — T380X5A Adverse effect of glucocorticoids and synthetic analogues, initial encounter: Secondary | ICD-10-CM | POA: Diagnosis present

## 2018-07-11 DIAGNOSIS — Z8711 Personal history of peptic ulcer disease: Secondary | ICD-10-CM | POA: Diagnosis not present

## 2018-07-11 DIAGNOSIS — Z20828 Contact with and (suspected) exposure to other viral communicable diseases: Secondary | ICD-10-CM | POA: Diagnosis present

## 2018-07-11 DIAGNOSIS — Z881 Allergy status to other antibiotic agents status: Secondary | ICD-10-CM

## 2018-07-11 DIAGNOSIS — J4540 Moderate persistent asthma, uncomplicated: Secondary | ICD-10-CM | POA: Diagnosis present

## 2018-07-11 DIAGNOSIS — Z882 Allergy status to sulfonamides status: Secondary | ICD-10-CM

## 2018-07-11 DIAGNOSIS — I5033 Acute on chronic diastolic (congestive) heart failure: Secondary | ICD-10-CM | POA: Diagnosis present

## 2018-07-11 DIAGNOSIS — Z8249 Family history of ischemic heart disease and other diseases of the circulatory system: Secondary | ICD-10-CM

## 2018-07-11 DIAGNOSIS — D869 Sarcoidosis, unspecified: Secondary | ICD-10-CM | POA: Diagnosis not present

## 2018-07-11 DIAGNOSIS — Z888 Allergy status to other drugs, medicaments and biological substances status: Secondary | ICD-10-CM

## 2018-07-11 DIAGNOSIS — M810 Age-related osteoporosis without current pathological fracture: Secondary | ICD-10-CM | POA: Diagnosis present

## 2018-07-11 DIAGNOSIS — Z887 Allergy status to serum and vaccine status: Secondary | ICD-10-CM

## 2018-07-11 DIAGNOSIS — R7989 Other specified abnormal findings of blood chemistry: Secondary | ICD-10-CM | POA: Diagnosis present

## 2018-07-11 DIAGNOSIS — M1990 Unspecified osteoarthritis, unspecified site: Secondary | ICD-10-CM | POA: Diagnosis present

## 2018-07-11 DIAGNOSIS — I4892 Unspecified atrial flutter: Secondary | ICD-10-CM | POA: Diagnosis present

## 2018-07-11 DIAGNOSIS — I2729 Other secondary pulmonary hypertension: Secondary | ICD-10-CM | POA: Diagnosis present

## 2018-07-11 DIAGNOSIS — Z800 Family history of malignant neoplasm of digestive organs: Secondary | ICD-10-CM

## 2018-07-11 DIAGNOSIS — I472 Ventricular tachycardia: Secondary | ICD-10-CM | POA: Diagnosis present

## 2018-07-11 DIAGNOSIS — Z79899 Other long term (current) drug therapy: Secondary | ICD-10-CM

## 2018-07-11 DIAGNOSIS — I4510 Unspecified right bundle-branch block: Secondary | ICD-10-CM | POA: Diagnosis present

## 2018-07-11 LAB — COMPREHENSIVE METABOLIC PANEL
ALT: 51 U/L — ABNORMAL HIGH (ref 0–44)
AST: 54 U/L — ABNORMAL HIGH (ref 15–41)
Albumin: 3.5 g/dL (ref 3.5–5.0)
Alkaline Phosphatase: 134 U/L — ABNORMAL HIGH (ref 38–126)
Anion gap: 16 — ABNORMAL HIGH (ref 5–15)
BUN: 29 mg/dL — ABNORMAL HIGH (ref 6–20)
CO2: 16 mmol/L — ABNORMAL LOW (ref 22–32)
Calcium: 9.2 mg/dL (ref 8.9–10.3)
Chloride: 107 mmol/L (ref 98–111)
Creatinine, Ser: 1.94 mg/dL — ABNORMAL HIGH (ref 0.44–1.00)
GFR calc Af Amer: 32 mL/min — ABNORMAL LOW (ref >60)
GFR calc non Af Amer: 28 mL/min — ABNORMAL LOW (ref >60)
Glucose, Bld: 171 mg/dL — ABNORMAL HIGH (ref 70–99)
Potassium: 3.3 mmol/L — ABNORMAL LOW (ref 3.5–5.1)
Sodium: 139 mmol/L (ref 135–145)
Total Bilirubin: 2.4 mg/dL — ABNORMAL HIGH (ref 0.3–1.2)
Total Protein: 6.9 g/dL (ref 6.5–8.1)

## 2018-07-11 LAB — RESPIRATORY PANEL BY PCR

## 2018-07-11 LAB — CBC WITH DIFFERENTIAL/PLATELET
Abs Immature Granulocytes: 0.19 10*3/uL — ABNORMAL HIGH (ref 0.00–0.07)
Basophils Absolute: 0.1 10*3/uL (ref 0.0–0.1)
Basophils Relative: 1 %
Eosinophils Absolute: 0.1 10*3/uL (ref 0.0–0.5)
Eosinophils Relative: 1 %
HCT: 47.9 % — ABNORMAL HIGH (ref 36.0–46.0)
Hemoglobin: 15.1 g/dL — ABNORMAL HIGH (ref 12.0–15.0)
Immature Granulocytes: 2 %
Lymphocytes Relative: 25 %
Lymphs Abs: 2 10*3/uL (ref 0.7–4.0)
MCH: 31.5 pg (ref 26.0–34.0)
MCHC: 31.5 g/dL (ref 30.0–36.0)
MCV: 100 fL (ref 80.0–100.0)
Monocytes Absolute: 0.6 10*3/uL (ref 0.1–1.0)
Monocytes Relative: 7 %
Neutro Abs: 5.2 10*3/uL (ref 1.7–7.7)
Neutrophils Relative %: 64 %
Platelets: 165 10*3/uL (ref 150–400)
RBC: 4.79 MIL/uL (ref 3.87–5.11)
RDW: 18 % — ABNORMAL HIGH (ref 11.5–15.5)
WBC: 8.1 10*3/uL (ref 4.0–10.5)
nRBC: 0.2 % (ref 0.0–0.2)

## 2018-07-11 LAB — URINALYSIS, ROUTINE W REFLEX MICROSCOPIC
Bacteria, UA: NONE SEEN
Bilirubin Urine: NEGATIVE
Glucose, UA: NEGATIVE mg/dL
Ketones, ur: NEGATIVE mg/dL
Leukocytes,Ua: NEGATIVE
Nitrite: NEGATIVE
Protein, ur: NEGATIVE mg/dL
Specific Gravity, Urine: 1.004 — ABNORMAL LOW (ref 1.005–1.030)
pH: 6 (ref 5.0–8.0)

## 2018-07-11 LAB — GLUCOSE, CAPILLARY
Glucose-Capillary: 125 mg/dL — ABNORMAL HIGH (ref 70–99)
Glucose-Capillary: 115 mg/dL — ABNORMAL HIGH (ref 70–99)
Glucose-Capillary: 193 mg/dL — ABNORMAL HIGH (ref 70–99)
Glucose-Capillary: 156 mg/dL — ABNORMAL HIGH (ref 70–99)
Glucose-Capillary: 110 mg/dL — ABNORMAL HIGH (ref 70–99)

## 2018-07-11 LAB — POCT I-STAT 7, (LYTES, BLD GAS, ICA,H+H)
Acid-base deficit: 10 mmol/L — ABNORMAL HIGH (ref 0.0–2.0)
Bicarbonate: 20 mmol/L (ref 20.0–28.0)
Calcium, Ion: 1.24 mmol/L (ref 1.15–1.40)
HCT: 42 % (ref 36.0–46.0)
Hemoglobin: 14.3 g/dL (ref 12.0–15.0)
O2 Saturation: 100 %
Patient temperature: 97
Potassium: 5.2 mmol/L — ABNORMAL HIGH (ref 3.5–5.1)
Sodium: 134 mmol/L — ABNORMAL LOW (ref 135–145)
TCO2: 22 mmol/L (ref 22–32)
pCO2 arterial: 58.7 mmHg — ABNORMAL HIGH (ref 32.0–48.0)
pH, Arterial: 7.136 — CL (ref 7.350–7.450)
pO2, Arterial: 282 mmHg — ABNORMAL HIGH (ref 83.0–108.0)

## 2018-07-11 LAB — SARS CORONAVIRUS 2 BY RT PCR (HOSPITAL ORDER, PERFORMED IN ~~LOC~~ HOSPITAL LAB): SARS Coronavirus 2: NEGATIVE

## 2018-07-11 LAB — DIGOXIN LEVEL: Digoxin Level: 0.6 ng/mL — ABNORMAL LOW (ref 0.8–2.0)

## 2018-07-11 LAB — LACTIC ACID, PLASMA
Lactic Acid, Venous: 4.4 mmol/L (ref 0.5–1.9)
Lactic Acid, Venous: 4.8 mmol/L (ref 0.5–1.9)

## 2018-07-11 LAB — TROPONIN I (HIGH SENSITIVITY)
Troponin I (High Sensitivity): 37 ng/L — ABNORMAL HIGH (ref <18)
Troponin I (High Sensitivity): 41 ng/L — ABNORMAL HIGH (ref <18)

## 2018-07-11 LAB — PROCALCITONIN: Procalcitonin: 0.35 ng/mL

## 2018-07-11 LAB — BRAIN NATRIURETIC PEPTIDE: B Natriuretic Peptide: 2992.4 pg/mL — ABNORMAL HIGH (ref 0.0–100.0)

## 2018-07-11 LAB — STREP PNEUMONIAE URINARY ANTIGEN: Strep Pneumo Urinary Antigen: NEGATIVE

## 2018-07-11 MED ORDER — METOPROLOL TARTRATE 12.5 MG HALF TABLET
12.5000 mg | ORAL_TABLET | Freq: Two times a day (BID) | ORAL | Status: DC
  Administered 2018-07-11: 12.5 mg via ORAL
  Filled 2018-07-11 (×4): qty 1

## 2018-07-11 MED ORDER — APIXABAN 2.5 MG PO TABS
2.5000 mg | ORAL_TABLET | Freq: Two times a day (BID) | ORAL | Status: DC
  Administered 2018-07-11 – 2018-07-14 (×7): 2.5 mg via ORAL
  Filled 2018-07-11 (×7): qty 1

## 2018-07-11 MED ORDER — CHLORHEXIDINE GLUCONATE 0.12 % MT SOLN: 15.0000 mL | Freq: Two times a day (BID) | OROMUCOSAL | Status: DC

## 2018-07-11 MED ORDER — SODIUM CHLORIDE 0.9 % IV SOLN
2.0000 g | Freq: Once | INTRAVENOUS | Status: AC
  Administered 2018-07-11: 2 g via INTRAVENOUS
  Filled 2018-07-11: qty 2

## 2018-07-11 MED ORDER — INSULIN ASPART 100 UNIT/ML ~~LOC~~ SOLN
2.0000 [IU] | SUBCUTANEOUS | Status: DC
  Administered 2018-07-11 (×2): 4 [IU] via SUBCUTANEOUS
  Administered 2018-07-12 (×3): 2 [IU] via SUBCUTANEOUS

## 2018-07-11 MED ORDER — NITROGLYCERIN IN D5W 200-5 MCG/ML-% IV SOLN
0.0000 ug/min | INTRAVENOUS | Status: DC
  Administered 2018-07-11: 50 ug/min via INTRAVENOUS
  Filled 2018-07-11: qty 250

## 2018-07-11 MED ORDER — ORAL CARE MOUTH RINSE: 15.0000 mL | Freq: Two times a day (BID) | OROMUCOSAL | Status: DC

## 2018-07-11 MED ORDER — FUROSEMIDE 10 MG/ML IJ SOLN
40.0000 mg | Freq: Three times a day (TID) | INTRAMUSCULAR | Status: DC
  Administered 2018-07-11 – 2018-07-12 (×4): 40 mg via INTRAVENOUS
  Filled 2018-07-11 (×5): qty 4

## 2018-07-11 MED ORDER — DIGOXIN 125 MCG PO TABS
0.1250 mg | ORAL_TABLET | Freq: Every day | ORAL | Status: DC
  Administered 2018-07-11 – 2018-07-14 (×4): 0.125 mg via ORAL
  Filled 2018-07-11 (×4): qty 1

## 2018-07-11 MED ORDER — IPRATROPIUM-ALBUTEROL 0.5-2.5 (3) MG/3ML IN SOLN
3.0000 mL | Freq: Four times a day (QID) | RESPIRATORY_TRACT | Status: DC
  Administered 2018-07-11 – 2018-07-14 (×13): 3 mL via RESPIRATORY_TRACT
  Filled 2018-07-11 (×15): qty 3

## 2018-07-11 MED ORDER — METHYLPREDNISOLONE SODIUM SUCC 125 MG IJ SOLR
60.0000 mg | Freq: Once | INTRAMUSCULAR | Status: AC
  Administered 2018-07-11: 60 mg via INTRAVENOUS
  Filled 2018-07-11: qty 2

## 2018-07-11 MED ORDER — PREDNISONE 20 MG PO TABS: 20.0000 mg | ORAL_TABLET | Freq: Every day | ORAL | Status: DC

## 2018-07-11 MED ORDER — LORAZEPAM 2 MG/ML IJ SOLN
0.5000 mg | Freq: Once | INTRAMUSCULAR | Status: AC
  Administered 2018-07-11: 0.5 mg via INTRAVENOUS
  Filled 2018-07-11: qty 1

## 2018-07-11 MED ORDER — ALBUTEROL SULFATE (2.5 MG/3ML) 0.083% IN NEBU: 2.5000 mg | INHALATION_SOLUTION | RESPIRATORY_TRACT | Status: DC | PRN

## 2018-07-11 MED ORDER — ASPIRIN 81 MG PO CHEW
324.0000 mg | CHEWABLE_TABLET | Freq: Once | ORAL | Status: AC
  Administered 2018-07-11: 324 mg via ORAL
  Filled 2018-07-11: qty 4

## 2018-07-11 MED ORDER — HYDROXYZINE HCL 25 MG PO TABS: 50.0000 mg | ORAL_TABLET | Freq: Four times a day (QID) | ORAL | Status: DC | PRN

## 2018-07-11 MED ORDER — VANCOMYCIN HCL IN DEXTROSE 1-5 GM/200ML-% IV SOLN
1000.0000 mg | Freq: Once | INTRAVENOUS | Status: AC
  Administered 2018-07-11: 1000 mg via INTRAVENOUS
  Filled 2018-07-11: qty 200

## 2018-07-11 MED ORDER — HYDRALAZINE HCL 20 MG/ML IJ SOLN: 5.0000 mg | INTRAMUSCULAR | Status: DC | PRN

## 2018-07-11 MED ORDER — BUDESONIDE 0.5 MG/2ML IN SUSP
0.5000 mg | Freq: Two times a day (BID) | RESPIRATORY_TRACT | Status: DC
  Administered 2018-07-11 – 2018-07-14 (×7): 0.5 mg via RESPIRATORY_TRACT
  Filled 2018-07-11 (×7): qty 2

## 2018-07-11 MED ORDER — FAMOTIDINE 20 MG PO TABS
20.0000 mg | ORAL_TABLET | Freq: Every day | ORAL | Status: DC
  Administered 2018-07-11 – 2018-07-14 (×4): 20 mg via ORAL
  Filled 2018-07-11 (×4): qty 1

## 2018-07-11 MED ORDER — FUROSEMIDE 10 MG/ML IJ SOLN
40.0000 mg | Freq: Once | INTRAMUSCULAR | Status: AC
  Administered 2018-07-11: 40 mg via INTRAVENOUS

## 2018-07-11 MED ORDER — PREDNISONE 20 MG PO TABS
40.0000 mg | ORAL_TABLET | Freq: Every day | ORAL | Status: DC
  Administered 2018-07-11 – 2018-07-14 (×4): 40 mg via ORAL
  Filled 2018-07-11 (×4): qty 2

## 2018-07-11 MED ORDER — PREDNISONE 10 MG PO TABS: 10.0000 mg | ORAL_TABLET | Freq: Every day | ORAL | Status: DC

## 2018-07-11 MED ORDER — LORATADINE 10 MG PO TABS
10.0000 mg | ORAL_TABLET | Freq: Every day | ORAL | Status: DC
  Administered 2018-07-11 – 2018-07-14 (×4): 10 mg via ORAL
  Filled 2018-07-11 (×4): qty 1

## 2018-07-11 MED ORDER — METHYLPREDNISOLONE SODIUM SUCC 125 MG IJ SOLR: 60.0000 mg | Freq: Three times a day (TID) | INTRAMUSCULAR | Status: DC

## 2018-07-11 MED ORDER — ORAL CARE MOUTH RINSE
15.0000 mL | Freq: Two times a day (BID) | OROMUCOSAL | Status: DC
  Administered 2018-07-11 – 2018-07-14 (×4): 15 mL via OROMUCOSAL

## 2018-07-11 MED ORDER — FUROSEMIDE 10 MG/ML IJ SOLN
INTRAMUSCULAR | Status: AC
  Filled 2018-07-11: qty 4

## 2018-07-11 MED ORDER — CHLORHEXIDINE GLUCONATE CLOTH 2 % EX PADS
6.0000 | MEDICATED_PAD | Freq: Every day | CUTANEOUS | Status: DC
  Administered 2018-07-11 – 2018-07-12 (×2): 6 via TOPICAL

## 2018-07-11 NOTE — Discharge Instructions (Signed)

## 2018-07-11 NOTE — ED Notes (Signed)
Reject x1,  Unsuccessful blood draw.

## 2018-07-11 NOTE — Progress Notes (Signed)
RT NOTE:  Pt being transported to [facility] on BIPAP by [place], RT. Report given to Sarah, RT.

## 2018-07-11 NOTE — ED Notes (Signed)
ED TO INPATIENT HANDOFF REPORT  ED Nurse Name and Phone #:  Gretta Cool, RN S Name/Age/Gender [name] 58 y.o. female Room/Bed: RESUSC/RESUSC  Code Status   Code Status: Full Code  Home/SNF/Other Home Patient oriented to: self, place, time and situation Is this baseline? Yes   Triage Complete: Triage complete  Chief Complaint SOB  Triage Note Patient with sudden onset of shortness of breath.  Recently diagnosed with pneumonia.  Patient with sarcoidosis.  Patient on NRB.  She is Afib RVR with runs of Vtach.  She gave herself duonebs at home before EMS arrival.     Allergies Allergies  Allergen Reactions  . Bystolic [Nebivolol Hcl] Shortness Of Breath  . Doxycycline Shortness Of Breath  . Amoxicillin-Pot Clavulanate     REACTION: GI symptoms  . Amoxicillin-Pot Clavulanate Other (See Comments)    REACTION: GI symptoms  . Cardizem [Diltiazem Hcl] Other (See Comments)    HAs Pt can take Norvasc HAs Pt can take Norvasc  . Carvedilol Other (See Comments)    REACTION: tired REACTION: tired  . Cefdinir Diarrhea  . Cephalexin Diarrhea    REACTION: unspecified  . Hydrochlorothiazide Other (See Comments)    Dropped BP and caused hair loss   . Influenza Vaccines Other (See Comments)    Allergic to flu vaccinations per pt.  Soreness and "cold symptoms" per pt Allergic to flu vaccinations per pt.  Soreness and "cold symptoms" per pt  . Pneumovax [Pneumococcal Polysaccharide Vaccine]     ?reaction  . Prednisone Hypertension and Other (See Comments)  . Sulfamethoxazole     Other reaction(s): Unknown REACTION: unspecified REACTION: unspecified  . Sulfasalazine     Other reaction(s): Unknown  . Sulfonamide Derivatives   . Verapamil Other (See Comments)    fatigue fatigue  . Eliquis [Apixaban] Rash    Level of Care/Admitting Diagnosis ED Disposition    ED Disposition Condition Comment   Admit  Hospital Area: [facility] [[id number]]  Level of Care: ICU  [6]  Covid Evaluation: Confirmed COVID Negative  Diagnosis: Acute respiratory failure ([place]) [518.81.ICD-9-CM]  Admitting Physician: Renee Pain [[id number]]  Attending Physician: Renee Pain [[id number]]  Estimated length of stay: 3 - 4 days  Certification:: I certify this patient will need inpatient services for at least 2 midnights  PT Class (Do Not Modify): Inpatient [101]  PT Acc Code (Do Not Modify): Private [1]       B Medical/Surgery History Past Medical History:  Diagnosis Date  . AAA (abdominal aortic aneurysm) (HCC)    a. 4.2 cm by CT in 06/2016  . Allergy   . Anxiety   . Asthma   . Atrial flutter (HCC)    a. on Flecainide and Eliquis  . Cataract   . Depression    pt states no depression 07-11-15  . Glaucoma   . Heart murmur    as child only  . Herpes simplex   . Hypertension   . Multiple thyroid nodules   . Osteoarthritis   . Osteoporosis   . PUD (peptic ulcer disease)   . Pulmonary sarcoidosis ([place])   . Tubular adenoma of colon 07/2015  . Vitamin D deficiency    Past Surgical History:  Procedure Laterality Date  . BUNIONECTOMY     x2  . CATARACT EXTRACTION Right   . DENTAL SURGERY    . DILATION AND CURETTAGE OF UTERUS    . fibroid tumor removal    . GLAUCOMA SURGERY  Right eye  . MYOMECTOMY       A IV Location/Drains/Wounds Patient Lines/Drains/Airways Status   Active Line/Drains/Airways    Name:   Placement date:   Placement time:   Site:   Days:   Peripheral IV 07/11/18 Left Antecubital   07/11/18    0219    Antecubital   less than 1   Peripheral IV 07/11/18 Right Hand   07/11/18    0225    Hand   less than 1   External Urinary Catheter   07/11/18    0249    -   less than 1          Intake/Output Last 24 hours No intake or output data in the 24 hours ending 07/11/18 0447  Labs/Imaging Results for orders placed or performed during the hospital encounter of 07/11/18 (from the past 48 hour(s))  Lactic acid, plasma     Status:  Abnormal   Collection Time: 07/11/18  2:33 AM  Result Value Ref Range   Lactic Acid, Venous 4.4 (HH) 0.5 - 1.9 mmol/L    Comment: CRITICAL RESULT CALLED TO, READ BACK BY AND VERIFIED WITH: GRINDSTAFF,S RN 07/11/2018 0341 JORDANS Performed at [facility], [place] [address] [place], [place] [address]   CBC with Differential/Platelet     Status: Abnormal   Collection Time: 07/11/18  2:33 AM  Result Value Ref Range   WBC 8.1 4.0 - 10.5 K/uL   RBC 4.79 3.87 - 5.11 MIL/uL   Hemoglobin 15.1 (H) 12.0 - 15.0 g/dL   HCT 47.9 (H) 36.0 - 46.0 %   MCV 100.0 80.0 - 100.0 fL   MCH 31.5 26.0 - 34.0 pg   MCHC 31.5 30.0 - 36.0 g/dL   RDW 18.0 (H) 11.5 - 15.5 %   Platelets 165 150 - 400 K/uL   nRBC 0.2 0.0 - 0.2 %   Neutrophils Relative % 64 %   Neutro Abs 5.2 1.7 - 7.7 K/uL   Lymphocytes Relative 25 %   Lymphs Abs 2.0 0.7 - 4.0 K/uL   Monocytes Relative 7 %   Monocytes Absolute 0.6 0.1 - 1.0 K/uL   Eosinophils Relative 1 %   Eosinophils Absolute 0.1 0.0 - 0.5 K/uL   Basophils Relative 1 %   Basophils Absolute 0.1 0.0 - 0.1 K/uL   Immature Granulocytes 2 %   Abs Immature Granulocytes 0.19 (H) 0.00 - 0.07 K/uL    Comment: Performed at [place] [address] [place], [place] [address]  Comprehensive metabolic panel     Status: Abnormal   Collection Time: 07/11/18  2:33 AM  Result Value Ref Range   Sodium 139 135 - 145 mmol/L   Potassium 3.3 (L) 3.5 - 5.1 mmol/L   Chloride 107 98 - 111 mmol/L   CO2 16 (L) 22 - 32 mmol/L   Glucose, Bld 171 (H) 70 - 99 mg/dL   BUN 29 (H) 6 - 20 mg/dL   Creatinine, Ser 1.94 (H) 0.44 - 1.00 mg/dL   Calcium 9.2 8.9 - 10.3 mg/dL   Total Protein 6.9 6.5 - 8.1 g/dL   Albumin 3.5 3.5 - 5.0 g/dL   AST 54 (H) 15 - 41 U/L   ALT 51 (H) 0 - 44 U/L   Alkaline Phosphatase 134 (H) 38 - 126 U/L   Total Bilirubin 2.4 (H) 0.3 - 1.2 mg/dL   GFR calc non Af Amer 28 (L) >60 mL/min   GFR calc Af Wyvonnia Lora  32 (L) >60 mL/min   Anion gap 16 (H) 5 - 15    Comment:  Performed at [facility], [place] [address], [place], [place] [address]  Troponin I (High Sensitivity)     Status: Abnormal   Collection Time: 07/11/18  2:33 AM  Result Value Ref Range   Troponin I (High Sensitivity) 37 (H) <18 ng/L    Comment: (NOTE) Elevated high sensitivity troponin I (hsTnI) values and significant  changes across serial measurements may suggest ACS but many other  chronic and acute conditions are known to elevate hsTnI results.  Refer to the "Links" section for chest pain algorithms and additional  guidance. Performed at [facility], [place] [address], [place], [place] [address]   Brain natriuretic peptide     Status: Abnormal   Collection Time: 07/11/18  2:33 AM  Result Value Ref Range   B Natriuretic Peptide 2,992.4 (H) 0.0 - 100.0 pg/mL    Comment: Performed at [facility] [address], [place], [place] [address]  Digoxin level     Status: Abnormal   Collection Time: 07/11/18  2:33 AM  Result Value Ref Range   Digoxin Level 0.6 (L) 0.8 - 2.0 ng/mL    Comment: Performed at [facility], [place] [address], [place], [place] [address]  SARS Coronavirus 2 (CEPHEID - Performed in [place] hospital lab), Hosp Order     Status: None   Collection Time: 07/11/18  2:33 AM   Specimen: Nasopharyngeal Swab  Result Value Ref Range   SARS Coronavirus 2 NEGATIVE NEGATIVE    Comment: (NOTE) If result is NEGATIVE SARS-CoV-2 target nucleic acids are NOT DETECTED. The SARS-CoV-2 RNA is generally detectable in upper and lower  respiratory specimens during the acute phase of infection. The lowest  concentration of SARS-CoV-2 viral copies this assay can detect is 250  copies / mL. A negative result does not preclude SARS-CoV-2 infection  and should not be used as the sole basis for treatment or other  patient management decisions.  A negative result may occur with  improper specimen collection / handling, submission of specimen other  than nasopharyngeal  swab, presence of viral mutation(s) within the  areas targeted by this assay, and inadequate number of viral copies  (<250 copies / mL). A negative result must be combined with clinical  observations, patient history, and epidemiological information. If result is POSITIVE SARS-CoV-2 target nucleic acids are DETECTED. The SARS-CoV-2 RNA is generally detectable in upper and lower  respiratory specimens dur ing the acute phase of infection.  Positive  results are indicative of active infection with SARS-CoV-2.  Clinical  correlation with patient history and other diagnostic information is  necessary to determine patient infection status.  Positive results do  not rule out bacterial infection or co-infection with other viruses. If result is PRESUMPTIVE POSTIVE SARS-CoV-2 nucleic acids MAY BE PRESENT.   A presumptive positive result was obtained on the submitted specimen  and confirmed on repeat testing.  While 2019 novel coronavirus  (SARS-CoV-2) nucleic acids may be present in the submitted sample  additional confirmatory testing may be necessary for epidemiological  and / or clinical management purposes  to differentiate between  SARS-CoV-2 and other Sarbecovirus currently known to infect humans.  If clinically indicated additional testing with an alternate test  methodology [phone]) is advised. The SARS-CoV-2 RNA is generally  detectable in upper and lower respiratory sp ecimens during the acute  phase of infection. The  expected result is Negative. Fact Sheet for Patients:  StrictlyIdeas.no Fact Sheet for Healthcare Providers: BankingDealers.co.za This test is not yet approved or cleared by the [place] FDA and has been authorized for detection and/or diagnosis of SARS-CoV-2 by FDA under an Emergency Use Authorization (EUA).  This EUA will remain in effect (meaning this test can be used) for the duration of the COVID-19 declaration  under Section 564(b)(1) of the Act, 21 U.S.C. section 360bbb-3(b)(1), unless the authorization is terminated or revoked sooner. Performed at [facility], [place] [address] [place], [place] [address]   I-STAT 7, (LYTES, BLD GAS, ICA, H+H)     Status: Abnormal   Collection Time: 07/11/18  2:37 AM  Result Value Ref Range   pH, Arterial 7.136 (LL) 7.350 - 7.450   pCO2 arterial 58.7 (H) 32.0 - 48.0 mmHg   pO2, Arterial 282.0 (H) 83.0 - 108.0 mmHg   Bicarbonate 20.0 20.0 - 28.0 mmol/L   TCO2 22 22 - 32 mmol/L   O2 Saturation 100.0 %   Acid-base deficit 10.0 (H) 0.0 - 2.0 mmol/L   Sodium 134 (L) 135 - 145 mmol/L   Potassium 5.2 (H) 3.5 - 5.1 mmol/L   Calcium, Ion 1.24 1.15 - 1.40 mmol/L   HCT 42.0 36.0 - 46.0 %   Hemoglobin 14.3 12.0 - 15.0 g/dL   Patient temperature 97.0 F    Collection site RADIAL, ALLEN'S TEST ACCEPTABLE    Drawn by Operator    Sample type ARTERIAL    Comment NOTIFIED PHYSICIAN    Dg Chest Portable 1 View  Result Date: 07/11/2018 CLINICAL DATA:  Shortness of breath EXAM: PORTABLE CHEST 1 VIEW COMPARISON:  06/27/2018 FINDINGS: Streaky bilateral upper lobe opacities, unchanged. Moderate cardiomegaly, also unchanged. No new area of consolidation. No pulmonary edema. IMPRESSION: Unchanged streaky bilateral upper lobe opacities consistent with sarcoidosis. Electronically Signed   By: Ulyses Jarred M.D.   On: 07/11/2018 02:45    Pending Labs Unresulted Labs (From admission, onward)    Start     Ordered   07/11/18 0500  Blood gas, arterial  Once,   R     07/11/18 0425   07/11/18 0423  Angiotensin converting enzyme  Once,   STAT     07/11/18 0422   07/11/18 0414  Expectorated sputum assessment w rflx to resp cult  Once,   R     07/11/18 0413   07/11/18 0414  Strep pneumoniae urinary antigen  Once,   STAT     07/11/18 0413   07/11/18 0414  Legionella Pneumophila Serogp 1 Ur Ag  Once,   STAT     07/11/18 0413   07/11/18 0413  Respiratory Panel by PCR  (Respiratory  virus panel with precautions)  Once,   STAT     07/11/18 0412   07/11/18 0413  Procalcitonin - Baseline  ONCE - STAT,   STAT     07/11/18 0413   07/11/18 0244  Blood culture (routine x 2)  BLOOD CULTURE X 2,   STAT     07/11/18 0243   07/11/18 0219  Lactic acid, plasma  Now then every 2 hours,   STAT     07/11/18 0218   07/11/18 0219  Troponin I (High Sensitivity)  STAT Now then every 2 hours,   R    Question:  Indication  Answer:  Other   07/11/18 0218   07/11/18 0219  Urinalysis, Routine w reflex microscopic  Once,   STAT  07/11/18 0218          Vitals/Pain Today's Vitals   07/11/18 0400 07/11/18 0415 07/11/18 0430 07/11/18 0445  BP: (!) 136/99 (!) 119/96 (!) 126/106 126/84  Pulse: 80 78  83  Resp: 14 (!) 25 (!) 22 19  Temp:      TempSrc:      SpO2: 95% 100%  100%  PainSc:        Isolation Precautions Droplet precaution  Medications Medications  nitroGLYCERIN 50 mg in dextrose 5 % 250 mL (0.2 mg/mL) infusion (0 mcg/min Intravenous Stopped 07/11/18 0445)  vancomycin (VANCOCIN) IVPB 1000 mg/200 mL premix (1,000 mg Intravenous New Bag/Given 07/11/18 0415)  apixaban (ELIQUIS) tablet 2.5 mg (has no administration in time range)  digoxin (LANOXIN) tablet 0.125 mg (has no administration in time range)  famotidine (PEPCID) tablet 20 mg (has no administration in time range)  metoprolol tartrate (LOPRESSOR) tablet 12.5 mg (has no administration in time range)  loratadine (CLARITIN) tablet 10 mg (has no administration in time range)  insulin aspart (novoLOG) injection 2-6 Units (has no administration in time range)  methylPREDNISolone sodium succinate (SOLU-MEDROL) 125 mg/2 mL injection 60 mg (has no administration in time range)  albuterol (PROVENTIL) (2.5 MG/3ML) 0.083% nebulizer solution 2.5 mg (has no administration in time range)  ipratropium-albuterol (DUONEB) 0.5-2.5 (3) MG/3ML nebulizer solution 3 mL (has no administration in time range)  budesonide (PULMICORT) nebulizer  solution 0.5 mg (has no administration in time range)  furosemide (LASIX) injection 40 mg (40 mg Intravenous Given 07/11/18 0223)  aspirin chewable tablet 324 mg (324 mg Oral Given 07/11/18 0240)  ceFEPIme (MAXIPIME) 2 g in sodium chloride 0.9 % 100 mL IVPB (0 g Intravenous Stopped 07/11/18 0409)  methylPREDNISolone sodium succinate (SOLU-MEDROL) 125 mg/2 mL injection 60 mg (60 mg Intravenous Given 07/11/18 0413)  LORazepam (ATIVAN) injection 0.5 mg (0.5 mg Intravenous Given 07/11/18 0412)    Mobility walks Low fall risk   Focused Assessments Cardiac Assessment Handoff:  Cardiac Rhythm: Atrial fibrillation Lab Results  Component Value Date   TROPONINI <0.03 06/24/2018   No results found for: DDIMER Does the Patient currently have chest pain? No     R Recommendations: See Admitting Provider Note  Report given to:   Additional Notes:

## 2018-07-11 NOTE — Progress Notes (Signed)
RT NOTE:  Covid test negative. Pt started on BIPAP. Rt will monitor.

## 2018-07-11 NOTE — Progress Notes (Signed)
RT NOTE:  ABG ordered at 0500 not obtained. Patient off bipap upon RT arrival to bedside. No need for ABG per MD- ABG order discontinued. RT will continue to monitor as needed.

## 2018-07-11 NOTE — ED Triage Notes (Signed)
Patient with sudden onset of shortness of breath.  Recently diagnosed with pneumonia.  Patient with sarcoidosis.  Patient on NRB.  She is Afib RVR with runs of Vtach.  She gave herself duonebs at home before EMS arrival.

## 2018-07-11 NOTE — H&P (Addendum)
NAME:  [name], MRN:  [id number], DOB:  1960/09/12, LOS: 0 ADMISSION DATE:  07/11/2018, CONSULTATION DATE:  07/11/2018 REFERRING MD:  Dr. Wyvonnia Dusky , CHIEF COMPLAINT:  Respiratory Distress    History of present illness   58 year old female, recently admitted 6/10-6/14 with acute hypoxic/hypercarbic respiratory failure requiring intubation. COVID negative. Presumed secondary to decompensated heart failure and sarcoid flare. Treated with solu-medrol, patient refused steroid course due to tachycardia and HTN. Presented to ED on 6/27 with shortness of breath, reports that this has been progressive over the last few days however suddenly worsened this AM. On arrival noted to be in A.Fib RVR. CXR with no acute. Given nebs, nitro gtt, and solu-medrol. Placed on BiPAP for ABG 7.136/58.7/282. Admitted to PCCM.   Past Medical History  Pulmonary Sarcoidosis, Asthma, A. Fib/Flutter on eliquis, HTN, Anxiety, AAA, and PUD   Significant Hospital Events   6/27 > Presents to ED   Consults:  PCCM  Procedures:  N/A  Significant Diagnostic Tests:  CXR 6/27 > Streaky bilateral upper lobe opacities, unchanged. Moderate cardiomegaly, also unchanged. No new area of consolidation. No pulmonary edema  Micro Data:  Blood 6/27 >> Sputum 6/27 >> U/A 6/27 >> COVID 6/27 > Negative  RVP 6/27 >> Urine Leg 6/27 >>  Urine Strep 6/27 >>   Antimicrobials:  Vancomycin Cefepime     Interim history/subjective:  As above   Objective   Blood pressure (!) 119/96, pulse 78, temperature (!) 97 F (36.1 C), temperature source Temporal, resp. rate (!) 25, SpO2 100 %.    Vent Mode: BIPAP FiO2 (%):  [40 %] 40 % Set Rate:  [12 bmp] 12 bmp PEEP:  [6 cmH20] 6 cmH20  No intake or output data in the 24 hours ending 07/11/18 0437 There were no vitals filed for this visit.  Examination: General: Adult female, mild respiratory distress  HENT: Dry MM, BiPAP in place  Lungs: Exp Wheeze noted in upper lobes, rhonchi  to bases  Cardiovascular: rate controlled, irregular, no MRG  Abdomen: non-distended, active bowel sounds  Extremities: BLE non-pitting edema Neuro: alert, oriented, follows commands  GU: intact   Resolved Hospital Problem list     Assessment & Plan:   Acute Hypoxic/Hypercarbic Respiratory Failure presumed secondary to Sarcoid Flare --Patient with non-compliance with steroids >> due to complaints of HTN/ Tachycardia  --CT Chest on 6/10 with chronic changes, increase in reticulonodular infiltrate, ground-glass infiltrate in the lower lobes   H/O Pulmonary Sarcoidosis, Asthma, (1-2L [place] PRN) Plan -Continue BiPAP >> Repeat ABG at 0500 -Titrate Supplemental Oxygen to Maintain Saturation >92  -Scheduled Duoneb, Pulmicort, continue Claritin  -PRN Albuterol -Received 125 mg solu-medrol in ED, continue 60 mg q8h >> slow taper  -Send PCT -Received Cefepime/Vancomycin in ED >> Hold for now and follow culture data, send RVP, COVID negative, send urine Strep, Legion   Hypertension -ECHO 6/10 with LV Basal Septal Thinning with hypokinesis, normal systolic function with EF 60-65, LVEDP 19  -Cardiac MRI 09/2017 > Negative for cardiac sarcoid  H/O A.Fib Plan  -Cardiac Monitoring  -Continue Digoxin, Metoprolol -PRN Hydralazine -Continue Eliquis   Acute on Chronic Kidney Disease Stage 3 Lactic Acidosis secondary to hypoperfusion  Plan -Trend BMP  -Avoid Nephrotoxic medications  -Trend LA to clearance   H/O PUD Plan -Pepcid   Hyperglycemia secondary to steroids  Plan  -Trend Glucose  -SSI   Best practice:  Diet: NPO DVT prophylaxis: Eliquis GI prophylaxis: Pepcid  Glucose control: SSI Mobility: Bedrest  Code Status: FC Family Communication: Will notify sister  Disposition:   Labs   CBC: Recent Labs  Lab 07/11/18 0233 07/11/18 0237  WBC 8.1  --   NEUTROABS 5.2  --   HGB 15.1* 14.3  HCT 47.9* 42.0  MCV 100.0  --   PLT 165  --     Basic Metabolic Panel: Recent Labs    Lab 07/11/18 0233 07/11/18 0237  NA 139 134*  K 3.3* 5.2*  CL 107  --   CO2 16*  --   GLUCOSE 171*  --   BUN 29*  --   CREATININE 1.94*  --   CALCIUM 9.2  --    GFR: Estimated Creatinine Clearance: 26.2 mL/min (A) (by C-G formula based on SCr of 1.94 mg/dL (H)). Recent Labs  Lab 07/11/18 0233  WBC 8.1  LATICACIDVEN 4.4*    Liver Function Tests: Recent Labs  Lab 07/11/18 0233  AST 54*  ALT 51*  ALKPHOS 134*  BILITOT 2.4*  PROT 6.9  ALBUMIN 3.5   No results for input(s): LIPASE, AMYLASE in the last 168 hours. No results for input(s): AMMONIA in the last 168 hours.  ABG    Component Value Date/Time   PHART 7.136 (LL) 07/11/2018 0237   PCO2ART 58.7 (H) 07/11/2018 0237   PO2ART 282.0 (H) 07/11/2018 0237   HCO3 20.0 07/11/2018 0237   TCO2 22 07/11/2018 0237   ACIDBASEDEF 10.0 (H) 07/11/2018 0237   O2SAT 100.0 07/11/2018 0237     Coagulation Profile: No results for input(s): INR, PROTIME in the last 168 hours.  Cardiac Enzymes: No results for input(s): CKTOTAL, CKMB, CKMBINDEX, TROPONINI in the last 168 hours.  HbA1C: Hgb A1c MFr Bld  Date/Time Value Ref Range Status  06/26/2018 05:25 AM 5.5 4.8 - 5.6 % Final    Comment:    (NOTE) Pre diabetes:          5.7%-6.4% Diabetes:              >6.4% Glycemic control for   <7.0% adults with diabetes   05/18/2013 07:44 AM 5.9 4.6 - 6.5 % Final    Comment:    Glycemic Control Guidelines for People with Diabetes:Non Diabetic:  <6%Goal of Therapy: <7%Additional Action Suggested:  >8%     CBG: No results for input(s): GLUCAP in the last 168 hours.  Review of Systems:   Review of Systems  Constitutional: Negative for chills and fever.  Respiratory: Positive for shortness of breath and wheezing. Negative for cough, hemoptysis and sputum production.   Cardiovascular: Negative for chest pain.  Gastrointestinal: Negative for abdominal pain, nausea and vomiting.  Genitourinary: Negative for frequency.    Past  Medical History  She,  has a past medical history of AAA (abdominal aortic aneurysm) ([place]), Allergy, Anxiety, Asthma, Atrial flutter ([place]), Cataract, Depression, Glaucoma, Heart murmur, Herpes simplex, Hypertension, Multiple thyroid nodules, Osteoarthritis, Osteoporosis, PUD (peptic ulcer disease), Pulmonary sarcoidosis ([place]), Tubular adenoma of colon (07/2015), and Vitamin D deficiency.   Surgical History    Past Surgical History:  Procedure Laterality Date   BUNIONECTOMY     x2   CATARACT EXTRACTION Right    DENTAL SURGERY     DILATION AND CURETTAGE OF UTERUS     fibroid tumor removal     GLAUCOMA SURGERY     Right eye   MYOMECTOMY       Social History   reports that she has never smoked. She has never used smokeless tobacco. She reports that  she does not drink alcohol or use drugs.   Family History   Her family history includes Heart Problems in her father; Hypertension in her father; Lung cancer in her mother; Stomach cancer in her cousin; Stroke in her father. There is no history of Colon cancer, Colon polyps, Esophageal cancer, or Rectal cancer.   Allergies Allergies  Allergen Reactions   Bystolic [Nebivolol Hcl] Shortness Of Breath   Doxycycline Shortness Of Breath   Amoxicillin-Pot Clavulanate     REACTION: GI symptoms   Amoxicillin-Pot Clavulanate Other (See Comments)    REACTION: GI symptoms   Cardizem [Diltiazem Hcl] Other (See Comments)    HAs Pt can take Norvasc HAs Pt can take Norvasc   Carvedilol Other (See Comments)    REACTION: tired REACTION: tired   Cefdinir Diarrhea   Cephalexin Diarrhea    REACTION: unspecified   Hydrochlorothiazide Other (See Comments)    Dropped BP and caused hair loss    Influenza Vaccines Other (See Comments)    Allergic to flu vaccinations per pt.  Soreness and "cold symptoms" per pt Allergic to flu vaccinations per pt.  Soreness and "cold symptoms" per pt   Pneumovax [Pneumococcal Polysaccharide Vaccine]       ?reaction   Prednisone Hypertension and Other (See Comments)   Sulfamethoxazole     Other reaction(s): Unknown REACTION: unspecified REACTION: unspecified   Sulfasalazine     Other reaction(s): Unknown   Sulfonamide Derivatives    Verapamil Other (See Comments)    fatigue fatigue   Eliquis [Apixaban] Rash     Home Medications  Prior to Admission medications   Medication Sig Start Date End Date Taking? Authorizing Provider  albuterol (PROVENTIL) (2.5 MG/3ML) 0.083% nebulizer solution Take 3 mLs (2.5 mg total) by nebulization every 4 (four) hours as needed for wheezing or shortness of breath. 06/28/18   Mariel Aloe, MD  albuterol (VENTOLIN HFA) 108 (90 Base) MCG/ACT inhaler Inhale 1 puff into the lungs every 6 (six) hours as needed for shortness of breath. 06/19/18   [provider]  apixaban (ELIQUIS) 2.5 MG TABS tablet Take 2.5 mg by mouth 2 (two) times daily.    [provider]  Cyanocobalamin (VITAMIN B12) 500 MCG TABS Take 500 mcg by mouth daily. Reported on 06/20/2015    [provider]  digoxin (LANOXIN) 0.125 MG tablet Take 1 tablet (0.125 mg total) by mouth daily. 06/28/18   Mariel Aloe, MD  famotidine (PEPCID) 20 MG tablet Take 1 tablet (20 mg total) by mouth daily with supper. 06/28/18   Mariel Aloe, MD  fexofenadine (ALLEGRA) 180 MG tablet Take 180 mg by mouth daily.    [provider]  hydrOXYzine (VISTARIL) 50 MG capsule Take 1 capsule (50 mg total) by mouth every 6 (six) hours as needed for anxiety. 06/28/18   Mariel Aloe, MD  metoprolol tartrate (LOPRESSOR) 25 MG tablet Take 0.5 tablets (12.5 mg total) by mouth 2 (two) times daily. 06/28/18   Mariel Aloe, MD  Multiple Vitamins-Minerals (HAIR SKIN & NAILS ADVANCED PO) Take 2 capsules by mouth daily.    [provider]     Critical care time: 62 minutes     Hayden Pedro, AGACNP-BC [facility]  PCCM Pgr: [phone]

## 2018-07-11 NOTE — Progress Notes (Signed)
VASCULAR LAB PRELIMINARY  PRELIMINARY  PRELIMINARY  PRELIMINARY  Bilateral lower extremity venous duplex completed.    Preliminary report:  See CV proc for preliminary results.   Kall[place]i Coupland, RVT 07/11/2018, 1:43 PM

## 2018-07-11 NOTE — ED Provider Notes (Signed)
[facility] EMERGENCY DEPARTMENT Provider Note   CSN: [id number] Arrival date & time: 07/11/18  0210     History   Chief Complaint Chief Complaint  Patient presents with  . Shortness of Breath    HPI [name] is a 58 y.o. female.     Level 5 for respiratory distress.  EMS reports sudden onset of shortness of breath while sitting at home tonight.  They placed her in a nonrebreather and when unable to obtain IV access.  Patient was admitted to the hospital 2 weeks ago with respiratory failure and required intubation briefly.  Patient has been having A. fib with runs of PVCs about 4-5 beats at a time.  Patient gave herself duo nebs at home before EMS arrival. Patient has history of sarcoidosis on home oxygen, atrial flutter, recent diagnosis of pneumonia.  Reports no chest pain.  No cough or fever.  No abdominal pain, nausea or vomiting.   Shortness of Breath   Past Medical History:  Diagnosis Date  . AAA (abdominal aortic aneurysm) (H[facility])    a. 4.2 cm by CT in 06/2016  . Allergy   . Anxiety   . Asthma   . Atrial flutter ([facility])    a. on Flecainide and Eliquis  . Cataract   . Depression    pt states no depression 07-11-15  . Glaucoma   . Heart murmur    as child only  . Herpes simplex   . Hypertension   . Multiple thyroid nodules   . Osteoarthritis   . Osteoporosis   . PUD (peptic ulcer disease)   . Pulmonary sarcoidosis ([place])   . Tubular adenoma of colon 07/2015  . Vitamin D deficiency     Patient Active Problem List   Diagnosis Date Noted  . Acute respiratory failure ([place]) 06/24/2018  . Pulmonary sarcoidosis ([place])   . PUD (peptic ulcer disease)   . Multiple thyroid nodules   . Heart murmur   . Depression   . Cataract   . Atrial flutter ([place])   . Anxiety   . Allergy   . AAA (abdominal aortic aneurysm) ([place])   . Weight loss 02/29/2016  . Atrial fibrillation ([place]) 01/23/2016  . Tubular adenoma of colon 07/15/2015  . Acute  maxillary sinusitis 04/08/2015  . Colon cancer screening 02/21/2015  . Abdominal pain, left lower quadrant 06/20/2014  . Glaucoma 05/31/2014  . Loose stools 05/31/2014  . Dislocation of finger PIP joint 01/03/2014  . Finger pain, right 12/28/2013  . B12 deficiency 12/15/2013  . Hypercalcemia 09/10/2013  . Creatinine elevation 09/10/2013  . Nonallopathic lesion of cervical region 06/11/2013  . Elevated LFTs 05/21/2013  . Greater trochanteric bursitis of right hip 05/14/2013  . Hypokalemia 04/12/2013  . Right acetabular fracture ([place]) 04/02/2013  . Acute right hip pain 03/31/2013  . Right sciatic nerve pain 02/26/2013  . Neck pain on right side 02/26/2013  . Exposure to the flu 01/25/2013  . Fall due to stumbling 07/24/2012  . Onychomycosis 04/27/2012  . Dizziness 01/14/2012  . Abdominal pain, left upper quadrant 09/30/2011  . Irritable bladder 09/02/2011  . Hyperglycemia 09/02/2011  . Nasal turbinate hypertrophy 03/08/2011  . Hypersomnia 09/12/2010  . VAGINITIS 02/27/2010  . Headache(784.0) 02/13/2010  . TMJ PAIN 10/20/2009  . GRIEF REACTION 02/23/2009  . PERIMENOPAUSAL SYNDROME 05/24/2008  . FATIGUE 12/07/2007  . TACHYCARDIA 12/07/2007  . GANGLION CYST, WRIST, LEFT 07/17/2007  . Sarcoidosis 06/28/2007  . Allergic rhinitis 03/24/2007  . Osteoarthritis  03/24/2007  . HAND PAIN 03/24/2007  . TINEA PEDIS 12/10/2006  . Depression with anxiety 12/10/2006  . Asthma, moderate persistent 12/10/2006  . GLAUCOMA NOS 11/06/2006  . Essential hypertension 11/06/2006  . OSTEOPOROSIS 11/06/2006    Past Surgical History:  Procedure Laterality Date  . BUNIONECTOMY     x2  . CATARACT EXTRACTION Right   . DENTAL SURGERY    . DILATION AND CURETTAGE OF UTERUS    . fibroid tumor removal    . GLAUCOMA SURGERY     Right eye  . MYOMECTOMY       OB History   No obstetric history on file.      Home Medications    Prior to Admission medications   Medication Sig Start Date End  Date Taking? Authorizing Provider  albuterol (PROVENTIL) (2.5 MG/3ML) 0.083% nebulizer solution Take 3 mLs (2.5 mg total) by nebulization every 4 (four) hours as needed for wheezing or shortness of breath. 06/28/18   Mariel Aloe, MD  albuterol (VENTOLIN HFA) 108 (90 Base) MCG/ACT inhaler Inhale 1 puff into the lungs every 6 (six) hours as needed for shortness of breath. 06/19/18   [provider]  apixaban (ELIQUIS) 2.5 MG TABS tablet Take 2.5 mg by mouth 2 (two) times daily.    [provider]  Cyanocobalamin (VITAMIN B12) 500 MCG TABS Take 500 mcg by mouth daily. Reported on 06/20/2015    [provider]  digoxin (LANOXIN) 0.125 MG tablet Take 1 tablet (0.125 mg total) by mouth daily. 06/28/18   Mariel Aloe, MD  famotidine (PEPCID) 20 MG tablet Take 1 tablet (20 mg total) by mouth daily with supper. 06/28/18   Mariel Aloe, MD  fexofenadine (ALLEGRA) 180 MG tablet Take 180 mg by mouth daily.    [provider]  hydrOXYzine (VISTARIL) 50 MG capsule Take 1 capsule (50 mg total) by mouth every 6 (six) hours as needed for anxiety. 06/28/18   Mariel Aloe, MD  metoprolol tartrate (LOPRESSOR) 25 MG tablet Take 0.5 tablets (12.5 mg total) by mouth 2 (two) times daily. 06/28/18   Mariel Aloe, MD  Multiple Vitamins-Minerals (HAIR SKIN & NAILS ADVANCED PO) Take 2 capsules by mouth daily.    [provider]    Family History Family History  Problem Relation Age of Onset  . Lung cancer Mother        sarcoid  . Stroke Father   . Hypertension Father   . Heart Problems Father   . Stomach cancer Cousin        3rd cousin   . Colon cancer Neg Hx   . Colon polyps Neg Hx   . Esophageal cancer Neg Hx   . Rectal cancer Neg Hx     Social History Social History   Tobacco Use  . Smoking status: Never Smoker  . Smokeless tobacco: Never Used  Substance Use Topics  . Alcohol use: No    Alcohol/week: 0.0 standard drinks  . Drug use: No      Allergies   Bystolic [nebivolol hcl], Doxycycline, Amoxicillin-pot clavulanate, Amoxicillin-pot clavulanate, Cardizem [diltiazem hcl], Carvedilol, Cefdinir, Cephalexin, Hydrochlorothiazide, Influenza vaccines, Pneumovax [pneumococcal polysaccharide vaccine], Prednisone, Sulfamethoxazole, Sulfasalazine, Sulfonamide derivatives, Verapamil, and Eliquis [apixaban]   Review of Systems Review of Systems  Unable to perform ROS: Severe respiratory distress  Respiratory: Positive for shortness of breath.      Physical Exam Updated Vital Signs BP (!) 161/146   Pulse 88   Temp (!) 97 F (  36.1 C) (Temporal)   Resp (!) 28   SpO2 99%   Physical Exam Vitals signs and nursing note reviewed.  Constitutional:      General: She is in acute distress.     Appearance: She is well-developed. She is ill-appearing.  HENT:     Head: Normocephalic and atraumatic.     Mouth/Throat:     Pharynx: No oropharyngeal exudate.  Eyes:     Conjunctiva/sclera: Conjunctivae normal.     Pupils: Pupils are equal, round, and reactive to light.  Neck:     Musculoskeletal: Normal range of motion and neck supple.     Comments: No meningismus. Cardiovascular:     Rate and Rhythm: Normal rate and regular rhythm.     Heart sounds: Normal heart sounds. No murmur.  Pulmonary:     Effort: Pulmonary effort is normal. No respiratory distress.     Breath sounds: Rhonchi and rales present.  Abdominal:     Palpations: Abdomen is soft.     Tenderness: There is no abdominal tenderness. There is no guarding or rebound.  Musculoskeletal: Normal range of motion.        General: No tenderness.     Right lower leg: Edema present.     Left lower leg: Edema present.     Comments: +4 edema bilaterally  Skin:    General: Skin is warm.  Neurological:     Mental Status: She is alert and oriented to person, place, and time.     Cranial Nerves: No cranial nerve deficit.     Motor: No abnormal muscle tone.     Coordination:  Coordination normal.     Comments:  5/5 strength throughout. CN 2-12 intact.Equal grip strength.   Psychiatric:        Behavior: Behavior normal.      ED Treatments / Results  Labs (all labs ordered are listed, but only abnormal results are displayed) Labs Reviewed  LACTIC ACID, PLASMA - Abnormal; Notable for the following components:      Result Value   Lactic Acid, Venous 4.4 (*)    All other components within normal limits  LACTIC ACID, PLASMA - Abnormal; Notable for the following components:   Lactic Acid, Venous 4.8 (*)    All other components within normal limits  CBC WITH DIFFERENTIAL/PLATELET - Abnormal; Notable for the following components:   Hemoglobin 15.1 (*)    HCT 47.9 (*)    RDW 18.0 (*)    Abs Immature Granulocytes 0.19 (*)    All other components within normal limits  COMPREHENSIVE METABOLIC PANEL - Abnormal; Notable for the following components:   Potassium 3.3 (*)    CO2 16 (*)    Glucose, Bld 171 (*)    BUN 29 (*)    Creatinine, Ser 1.94 (*)    AST 54 (*)    ALT 51 (*)    Alkaline Phosphatase 134 (*)    Total Bilirubin 2.4 (*)    GFR calc non Af Amer 28 (*)    GFR calc Af Amer 32 (*)    Anion gap 16 (*)    All other components within normal limits  TROPONIN I (HIGH SENSITIVITY) - Abnormal; Notable for the following components:   Troponin I (High Sensitivity) 37 (*)    All other components within normal limits  TROPONIN I (HIGH SENSITIVITY) - Abnormal; Notable for the following components:   Troponin I (High Sensitivity) 41 (*)    All other components within normal limits  BRAIN NATRIURETIC PEPTIDE - Abnormal; Notable for the following components:   B Natriuretic Peptide 2,992.4 (*)    All other components within normal limits  URINALYSIS, ROUTINE W REFLEX MICROSCOPIC - Abnormal; Notable for the following components:   Color, Urine STRAW (*)    Specific Gravity, Urine 1.004 (*)    Hgb urine dipstick SMALL (*)    All other components within normal  limits  DIGOXIN LEVEL - Abnormal; Notable for the following components:   Digoxin Level 0.6 (*)    All other components within normal limits  GLUCOSE, CAPILLARY - Abnormal; Notable for the following components:   Glucose-Capillary 115 (*)    All other components within normal limits  POCT I-STAT 7, (LYTES, BLD GAS, ICA,H+H) - Abnormal; Notable for the following components:   pH, Arterial 7.136 (*)    pCO2 arterial 58.7 (*)    pO2, Arterial 282.0 (*)    Acid-base deficit 10.0 (*)    Sodium 134 (*)    Potassium 5.2 (*)    All other components within normal limits  SARS CORONAVIRUS 2 (HOSPITAL ORDER, [facility])  CULTURE, BLOOD (ROUTINE X 2)  CULTURE, BLOOD (ROUTINE X 2)  RESPIRATORY PANEL BY PCR  EXPECTORATED SPUTUM ASSESSMENT W REFEX TO RESP CULTURE  MRSA PCR SCREENING  PROCALCITONIN  STREP PNEUMONIAE URINARY ANTIGEN  LEGIONELLA PNEUMOPHILA SEROGP 1 UR AG  ANGIOTENSIN CONVERTING ENZYME  I-STAT ARTERIAL BLOOD GAS, ED    EKG EKG Interpretation  Date/Time:  Saturday July 11 2018 02:18:36 EDT Ventricular Rate:  111 PR Interval:    QRS Duration: 129 QT Interval:  378 QTC Calculation: 399 R Axis:   -62 Text Interpretation:  Atrial fibrillation Paired ventricular premature complexes RBBB and LAFB Artifact Interpretation limited secondary to artifact Confirmed by Ezequiel Essex [phone]) on 07/11/2018 2:41:08 AM   Radiology US Renal  Result Date: 07/11/2018 CLINICAL DATA:  Acute renal failure.  Sarcoidosis EXAM: RENAL / URINARY TRACT ULTRASOUND COMPLETE COMPARISON:  10/13/2017 abdominal MRI FINDINGS: Right Kidney: Renal measurements: 9 x 5 x 5 cm = volume: 120 mL. Increased echogenicity. No hydronephrosis or mass. Left Kidney: Renal measurements: 10 x 5 x 5.5 cm = volume: 134 mL. Increased echogenicity. Incidental 8 mm cyst. Cysts are underestimated relative to 2019 MRI. No hydronephrosis. Bladder: Appears normal for degree of limited bladder distention.  IMPRESSION: Medical renal disease without hydronephrosis or mass. Electronically Signed   By: Monte Fantasia M.D.   On: 07/11/2018 08:27   Dg Chest Portable 1 View  Result Date: 07/11/2018 CLINICAL DATA:  Shortness of breath EXAM: PORTABLE CHEST 1 VIEW COMPARISON:  06/27/2018 FINDINGS: Streaky bilateral upper lobe opacities, unchanged. Moderate cardiomegaly, also unchanged. No new area of consolidation. No pulmonary edema. IMPRESSION: Unchanged streaky bilateral upper lobe opacities consistent with sarcoidosis. Electronically Signed   By: Ulyses Jarred M.D.   On: 07/11/2018 02:45    Procedures Procedures (including critical care time)  Medications Ordered in ED Medications  nitroGLYCERIN 50 mg in dextrose 5 % 250 mL (0.2 mg/mL) infusion (50 mcg/min Intravenous New Bag/Given 07/11/18 0240)  furosemide (LASIX) injection 40 mg (40 mg Intravenous Given 07/11/18 0223)  aspirin chewable tablet 324 mg (324 mg Oral Given 07/11/18 0240)     Initial Impression / Assessment and Plan / ED Course  I have reviewed the triage vital signs and the nursing notes.  Pertinent labs & imaging results that were available during my care of the patient were reviewed by me and considered in  my medical decision making (see chart for details).       Respiratory distress that acutely worsened this morning. Tachypneic and tripoding on arrival.  IV lasix given.  Bronchodilators and steroids.   Bipap started after COVID negative. CXR with stable scarring.  Empiric antibiotics given, steroids for possible sarcoidosis flare.   Patient states she would want to be intubated if necessary. D/w Sister by phone.   ABG with mixed respiratory and metabolic acidosis. Lactate elevated likely 2/2 work of breathing rather than sepsis but empiric antibiotics given.  Patient feeling improved on bipap. Xray without obvious edema. Continue treatment for suspected sarcoidosis flare.   Admission d/w PCCM team. Sister Vaughan Basta updated  by phone. Final Clinical Impressions(s) / ED Diagnoses   Final diagnoses:  Acute respiratory failure with hypoxia [facility])    ED Discharge Orders    None       Ezequiel Essex, MD 07/11/18 [phone]

## 2018-07-11 NOTE — Progress Notes (Signed)
RT NOTE:  ABG results reported to MD. Awaiting rapid Covid-19 test. Will begin BIPAP at that time.

## 2018-07-11 NOTE — Progress Notes (Signed)
ICU Interim Note  Patient seen and examined. Having some issues that are all related to her sarcoid.  I think she has the following going on: (1) Nocturnal hypoxemia and respiratory insufficiency related to advancing central airway obstruction. 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 She warrants trials of at least 1 if not 2 DMARDs, preferably at a center that specializes in cardiac sarcoidosis.  She may eventually even need consideration for heart/lung transplant.  Unfortunately her insurance changed and she can only be seen by [facility] physicians going forward.  She has pulmonary and cardiology appts scheduled for early next month.  These medications would be better served initiating as an outpatient so that close f/u and lab monitoring can be assured.  So from an inpatient standpoint, would do following: (1) Continue IV diuresis as tolerated by renal function (2) Switch to a prolonged oral taper of steroids (see orders) that gets her to 10mg /day indefinitely.  By this time she should have been evaluated by her new pulmonologist. (3) I will try to get her a NIPPV for home as this will likely prevent admissions in short term   This patient has recurrent admissions for hypercarbic respiratory failure related to her pulmonary sarcoidosis.  A NIV used at night will help prevent readmissions.  OSA is not the cause of this disorder and BIPAP is not an option at this time.  She can be transferred to progressive care at this time, appreciate [facility] taking over as primary.  Someone from pulmonary will follow with you.

## 2018-07-11 NOTE — ED Notes (Signed)
Admitting MD at bedside.

## 2018-07-11 NOTE — Progress Notes (Addendum)
Pt refuses lopressor- stating that she is allergic to it. Pt HR 78- BP 126/107. Will closely monitor BP and HR throughout the night.

## 2018-07-11 NOTE — Progress Notes (Signed)
RT NOTE: RT noticed patient back on bipap. Per RN patient asked to be placed back on for shortness of breath. RT will continue to monitor.

## 2018-07-11 NOTE — Progress Notes (Signed)
Called nurse report to [place] staff nurse. Pt currently denies sob or cp sitting in chair, rr 24, oxygen 4 liters nasal cannula.

## 2018-07-12 DIAGNOSIS — J9819 Other pulmonary collapse: Secondary | ICD-10-CM

## 2018-07-12 DIAGNOSIS — N183 Chronic kidney disease, stage 3 (moderate): Secondary | ICD-10-CM

## 2018-07-12 DIAGNOSIS — D860 Sarcoidosis of lung: Principal | ICD-10-CM

## 2018-07-12 DIAGNOSIS — R7989 Other specified abnormal findings of blood chemistry: Secondary | ICD-10-CM

## 2018-07-12 DIAGNOSIS — N179 Acute kidney failure, unspecified: Secondary | ICD-10-CM

## 2018-07-12 DIAGNOSIS — I484 Atypical atrial flutter: Secondary | ICD-10-CM

## 2018-07-12 LAB — LEGIONELLA PNEUMOPHILA SEROGP 1 UR AG: L. pneumophila Serogp 1 Ur Ag: NEGATIVE

## 2018-07-12 LAB — BASIC METABOLIC PANEL
Anion gap: 16 — ABNORMAL HIGH (ref 5–15)
BUN: 36 mg/dL — ABNORMAL HIGH (ref 6–20)
CO2: 23 mmol/L (ref 22–32)
Calcium: 9.1 mg/dL (ref 8.9–10.3)
Chloride: 103 mmol/L (ref 98–111)
Creatinine, Ser: 1.94 mg/dL — ABNORMAL HIGH (ref 0.44–1.00)
GFR calc Af Amer: 32 mL/min — ABNORMAL LOW (ref >60)
GFR calc non Af Amer: 28 mL/min — ABNORMAL LOW (ref >60)
Glucose, Bld: 115 mg/dL — ABNORMAL HIGH (ref 70–99)
Potassium: 3.4 mmol/L — ABNORMAL LOW (ref 3.5–5.1)
Sodium: 142 mmol/L (ref 135–145)

## 2018-07-12 LAB — MAGNESIUM: Magnesium: 1.7 mg/dL (ref 1.7–2.4)

## 2018-07-12 LAB — GLUCOSE, CAPILLARY
Glucose-Capillary: 109 mg/dL — ABNORMAL HIGH (ref 70–99)
Glucose-Capillary: 133 mg/dL — ABNORMAL HIGH (ref 70–99)
Glucose-Capillary: 137 mg/dL — ABNORMAL HIGH (ref 70–99)
Glucose-Capillary: 142 mg/dL — ABNORMAL HIGH (ref 70–99)
Glucose-Capillary: 147 mg/dL — ABNORMAL HIGH (ref 70–99)
Glucose-Capillary: 97 mg/dL (ref 70–99)

## 2018-07-12 MED ORDER — INSULIN ASPART 100 UNIT/ML ~~LOC~~ SOLN
0.0000 [IU] | Freq: Three times a day (TID) | SUBCUTANEOUS | Status: DC
  Administered 2018-07-13: 1 [IU] via SUBCUTANEOUS
  Administered 2018-07-13: 2 [IU] via SUBCUTANEOUS
  Administered 2018-07-13 – 2018-07-14 (×3): 1 [IU] via SUBCUTANEOUS

## 2018-07-12 MED ORDER — FUROSEMIDE 40 MG PO TABS
40.0000 mg | ORAL_TABLET | Freq: Every day | ORAL | Status: DC
  Administered 2018-07-13: 40 mg via ORAL
  Filled 2018-07-12: qty 1

## 2018-07-12 MED ORDER — POTASSIUM CHLORIDE CRYS ER 20 MEQ PO TBCR
40.0000 meq | EXTENDED_RELEASE_TABLET | Freq: Once | ORAL | Status: AC
  Administered 2018-07-12: 40 meq via ORAL
  Filled 2018-07-12: qty 2

## 2018-07-12 MED ORDER — MAGNESIUM SULFATE 2 GM/50ML IV SOLN
2.0000 g | Freq: Once | INTRAVENOUS | Status: AC
  Administered 2018-07-12: 2 g via INTRAVENOUS
  Filled 2018-07-12: qty 50

## 2018-07-12 NOTE — Progress Notes (Addendum)
PROGRESS NOTE    [name]  WGN:[id number] DOB: 16-Nov-1960 DOA: 07/11/2018 PCP: Cassandria Anger, MD    Brief Narrative:  58 year old female who presented with respiratory distress.  She does have the significant past medical history for pulmonary and cardiac sarcoid.  Recent hospitalization for respiratory failure requiring invasive mechanical ventilation.  Rapidly progressive worsening dyspnea, to the point where she developed acute respiratory distress.  She was placed on noninvasive mechanical ventilation in the emergency department, her blood pressure was 119/96, heart rate 78, temperature 97, respiratory rate 25, 100% oxygen saturation, BiPAP 12/6, 40% FiO2.  Set expiratory wheezing, and rhonchi at bases, heart S1-S2 present and rhythmic, abdomen soft nontender, no lower extremity edema.  Arterial pH 7.13, PCO2 58, PO2 282, bicarb 22, oxygen saturation 100%.  Sodium 139, potassium 3.3, chloride 107, bicarb 16, glucose 171, BUN 29, creatinine 1.94, BNP 2992, troponin high-sensitivity 37.  Chest radiograph with hyperinflation, bilateral apical lobes scaring more right than left.  EKG 111 bpm, left axis deviation, atrial flutter with variable block, positive PACs, PVCs and right bundle branch block.   Patient was admitted to the hospital working diagnosis of acute on chronic hypercapnic respiratory failure.  Assessment & Plan:   Principal Problem:   Acute hypercapnic respiratory failure (HCC) Active Problems:   Cardiac sarcoidosis   Asthma, moderate persistent   Pulmonary sarcoidosis (HCC)   Atrial flutter (HCC)   Right middle lobe syndrome   Lactic acidosis   Elevated brain natriuretic peptide (BNP) level   Acute renal failure superimposed on stage 3 chronic kidney disease ([place])   1. Acute on chronic hypercapnic respiratory failure due to pulmonary sarcoidosis and COPD acute exacerbation. Patient tolerating well non invasive mechanical ventilation. Will need home non invasive  ventilation at discharge to prevent rehospitalization and worsening respiratory acidosis. Continue systemic steroids and bronchodilators. Continue budesonide.   2. Cardiac sarcoidosis/ diastolic heart failure decompensation/ LV EF 60 to 65% with reduced RV systolic function and RVSP 62.3 mmHg. Atrial arrhythmia on the monitor, ekg suggestive for a flutter with variable block. Will continue telemetry monitoring, will need further work up with heart failure clinic at [facility]. Will follow on cardiology recommendations. Continue furosemide 40 mg po daily for diuresis, urine output 1200 ml over last 24 H. Continue with metoprolol 12,5 mg, will consider up titration. Continue with digoxin. Continue anticoagulation for atrial flutter.   3. T2DM. Will continue glucose cover and monitoring with insulin sliding scale. Patient is tolerating po well.    4. AKi on CKD stage 3a, with hypokalemia and hypomagnesemia. Renal function  With serum cr at 1,94, K at 3,4 and serum bicarbonate at 23. Will change furosemide to po 40 mg for now and will follow on renal panel in am. Continue electrolyte correction with K and Mg.   DVT prophylaxis: apixaban   Code Status: full Family Communication: no family at the bedside  Disposition Plan/ discharge barriers: pending clinical improvement, home non invasive mechanical ventilation   Body mass index is 22.74 kg/m. Malnutrition Type:      Malnutrition Characteristics:      Nutrition Interventions:     RN Pressure Injury Documentation:     Consultants:   Pulmonary   Procedures:     Antimicrobials:       Subjective: Patient is feeling better, but continue to have significant dyspnea and not yet back to baseline, no chest pain, no nausea or vomiting.   Objective: Vitals:   07/12/18 0800 07/12/18 0830  07/12/18 0901 07/12/18 1152  BP: 133/88 128/90  137/84  Pulse: 96 96 (!) 51 66  Resp: 16 16 (!) 25 (!) 30  Temp:    97.6 F (36.4  C)  TempSrc:    Oral  SpO2: 100% 100% 100% 100%  Weight:        Intake/Output Summary (Last 24 hours) at 07/12/2018 1328 Last data filed at 07/12/2018 1221 Gross per 24 hour  Intake 440 ml  Output 1300 ml  Net -860 ml   Filed Weights   07/11/18 0500 07/12/18 0438  Weight: 53.1 kg 60.1 kg    Examination:   General: Not in pain or dyspnea, deconditioned and ill looking appearing  Neurology: Awake and alert, non focal  E ENT: mild pallor, no icterus, oral mucosa moist Cardiovascular: No JVD. S1-S2 present, rhythmic, no gallops, rubs, or murmurs.  Trace lower extremity edema. Pulmonary: decreased breath sounds bilaterally, decreased air movement, no significant wheezing or rhonchi, faint scattered rales. Gastrointestinal. Abdomen with no organomegaly, non tender, no rebound or guarding Skin. No rashes Musculoskeletal: no joint deformities     Data Reviewed: I have personally reviewed following labs and imaging studies  CBC: Recent Labs  Lab 07/11/18 0233 07/11/18 0237  WBC 8.1  --   NEUTROABS 5.2  --   HGB 15.1* 14.3  HCT 47.9* 42.0  MCV 100.0  --   PLT 165  --    Basic Metabolic Panel: Recent Labs  Lab 07/11/18 0233 07/11/18 0237 07/12/18 0527  NA 139 134* 142  K 3.3* 5.2* 3.4*  CL 107  --  103  CO2 16*  --  23  GLUCOSE 171*  --  115*  BUN 29*  --  36*  CREATININE 1.94*  --  1.94*  CALCIUM 9.2  --  9.1  MG  --   --  1.7   GFR: Estimated Creatinine Clearance: 27.3 mL/min (A) (by C-G formula based on SCr of 1.94 mg/dL (H)). Liver Function Tests: Recent Labs  Lab 07/11/18 0233  AST 54*  ALT 51*  ALKPHOS 134*  BILITOT 2.4*  PROT 6.9  ALBUMIN 3.5   No results for input(s): LIPASE, AMYLASE in the last 168 hours. No results for input(s): AMMONIA in the last 168 hours. Coagulation Profile: No results for input(s): INR, PROTIME in the last 168 hours. Cardiac Enzymes: No results for input(s): CKTOTAL, CKMB, CKMBINDEX, TROPONINI in the last 168 hours.  BNP (last 3 results) No results for input(s): PROBNP in the last 8760 hours. HbA1C: No results for input(s): HGBA1C in the last 72 hours. CBG: Recent Labs  Lab 07/11/18 1957 07/12/18 0007 07/12/18 0436 07/12/18 0733 07/12/18 1149  GLUCAP 110* 133* 109* 97 142*   Lipid Profile: No results for input(s): CHOL, HDL, LDLCALC, TRIG, CHOLHDL, LDLDIRECT in the last 72 hours. Thyroid Function Tests: No results for input(s): TSH, T4TOTAL, FREET4, T3FREE, THYROIDAB in the last 72 hours. Anemia Panel: No results for input(s): VITAMINB12, FOLATE, FERRITIN, TIBC, IRON, RETICCTPCT in the last 72 hours.    Radiology Studies: I have reviewed all of the imaging during this hospital visit personally     Scheduled Meds: . apixaban  2.5 mg Oral BID  . budesonide (PULMICORT) nebulizer solution  0.5 mg Nebulization BID  . Chlorhexidine Gluconate Cloth  6 each Topical Daily  . digoxin  0.125 mg Oral Daily  . famotidine  20 mg Oral Q supper  . furosemide  40 mg Intravenous Q8H  . insulin aspart  2-6 Units  Subcutaneous Q4H  . ipratropium-albuterol  3 mL Nebulization Q6H  . loratadine  10 mg Oral Daily  . mouth rinse  15 mL Mouth Rinse BID  . metoprolol tartrate  12.5 mg Oral BID  . predniSONE  40 mg Oral Q breakfast   Followed by  . [START ON 07/18/2018] predniSONE  20 mg Oral Q breakfast   Followed by  . [START ON 07/25/2018] predniSONE  10 mg Oral Q breakfast   Continuous Infusions:   LOS: 1 day        Mauricio Gerome Apley, MD

## 2018-07-12 NOTE — Progress Notes (Signed)
Pt having short runs of Vtach- Pt currently asymptomatic- K+= 3.4, Mg= 1.7 Called Critical Care with this information. Will monitor and await for orders for supplement

## 2018-07-12 NOTE — Progress Notes (Signed)
Pt requested to be taken off bipap- placed on 4L [place] O2 sats are at 100%

## 2018-07-12 NOTE — Progress Notes (Addendum)
Pulm f/u note DOS: 07/12/18  S: Seen in f/u for sarcoid flare and volume overload.  No events overnight.  With NIV slept "better than I have in weeks."  Leg swelling improving.  A good deal of ectopy on monitor in addition to underlying Aflutter.  O: GEN: middle aged woman in NAD HEENT: MMM, +JVD CV: Irregular, ext warm PULM: Central wheezing, no accessory muscle use GI: Soft, +BS EXT: Some muscle wasting, 2+ LE edema NEURO: moves all 4 ext to command PSYCH: AOx3, fair insight SKIN: No rashes  BUN/Cr stable Mg and potassium a bit low ~1L neg so far  A: -Advanced sarcoidosis with suggestion of cardiac involvement by recent echo and by runs of PVCs. -Central airway obstruction by sarcoidosis -Intolerance to sustained high dose steroids due to HTN -Recurrent hypercapneic respiratory failure due to aforementioned central airway obstruction -Volume overloaded state of heart related to biventricular dysfunction with suspicion of cardiac sarcoid -Groups 2/3/5 pulmonary HTN -Insurance issues: now has to use [facility] as OP  P: - NIV for home to use at night and PRN during day.  This patient has recurrent admissions for hypercarbic respiratory failure related to her pulmonary sarcoidosis.  A NIV used at night will help prevent readmissions.  OSA is not the cause of this disorder and BIPAP is not an option at this time.  RN to notify CM regarding - Continue IV diuresis, K/Mg repletion as needed, watch BUN/Cr - OOB walking - Steroid taper as ordered, patient has appt Wednesday with [facility] pulmonary/cardiology to consider DMARDs and other advanced therapies - Will ask cardiology regarding rhythm issues, I wonder if she would benefit from either an antiarrythmic or AICD placement but suspect may need OP event monitor to determine degree of ectopy burden which again we may need to defer to [facility]: update, discussed and they agree to let [facility] cardiology do workup later this week to prevent duplicating  everything - Will check on her tomorrow to assure heading in right direction - Will reach out to daughter today  Erskine Emery MD Pulm/CCM

## 2018-07-13 DIAGNOSIS — J9601 Acute respiratory failure with hypoxia: Secondary | ICD-10-CM

## 2018-07-13 DIAGNOSIS — E1122 Type 2 diabetes mellitus with diabetic chronic kidney disease: Secondary | ICD-10-CM

## 2018-07-13 DIAGNOSIS — D8685 Sarcoid myocarditis: Secondary | ICD-10-CM

## 2018-07-13 DIAGNOSIS — E119 Type 2 diabetes mellitus without complications: Secondary | ICD-10-CM

## 2018-07-13 DIAGNOSIS — N183 Chronic kidney disease, stage 3 unspecified: Secondary | ICD-10-CM | POA: Diagnosis present

## 2018-07-13 DIAGNOSIS — I483 Typical atrial flutter: Secondary | ICD-10-CM

## 2018-07-13 LAB — MAGNESIUM: Magnesium: 2.5 mg/dL — ABNORMAL HIGH (ref 1.7–2.4)

## 2018-07-13 LAB — BASIC METABOLIC PANEL
Anion gap: 11 (ref 5–15)
BUN: 43 mg/dL — ABNORMAL HIGH (ref 6–20)
CO2: 24 mmol/L (ref 22–32)
Calcium: 9.1 mg/dL (ref 8.9–10.3)
Chloride: 108 mmol/L (ref 98–111)
Creatinine, Ser: 1.92 mg/dL — ABNORMAL HIGH (ref 0.44–1.00)
GFR calc Af Amer: 33 mL/min — ABNORMAL LOW (ref >60)
GFR calc non Af Amer: 28 mL/min — ABNORMAL LOW (ref >60)
Glucose, Bld: 121 mg/dL — ABNORMAL HIGH (ref 70–99)
Potassium: 3.6 mmol/L (ref 3.5–5.1)
Sodium: 143 mmol/L (ref 135–145)

## 2018-07-13 LAB — GLUCOSE, CAPILLARY
Glucose-Capillary: 128 mg/dL — ABNORMAL HIGH (ref 70–99)
Glucose-Capillary: 138 mg/dL — ABNORMAL HIGH (ref 70–99)
Glucose-Capillary: 157 mg/dL — ABNORMAL HIGH (ref 70–99)
Glucose-Capillary: 139 mg/dL — ABNORMAL HIGH (ref 70–99)

## 2018-07-13 LAB — ANGIOTENSIN CONVERTING ENZYME: Angiotensin-Converting Enzyme: 82 U/L (ref 14–82)

## 2018-07-13 MED ORDER — POTASSIUM CHLORIDE CRYS ER 20 MEQ PO TBCR
30.0000 meq | EXTENDED_RELEASE_TABLET | Freq: Three times a day (TID) | ORAL | Status: AC
  Administered 2018-07-13 (×2): 30 meq via ORAL
  Filled 2018-07-13 (×2): qty 1

## 2018-07-13 MED ORDER — ACETAMINOPHEN 325 MG PO TABS
650.0000 mg | ORAL_TABLET | Freq: Four times a day (QID) | ORAL | Status: DC | PRN
  Administered 2018-07-14: 650 mg via ORAL
  Filled 2018-07-13: qty 2

## 2018-07-13 MED ORDER — FUROSEMIDE 40 MG PO TABS
40.0000 mg | ORAL_TABLET | Freq: Two times a day (BID) | ORAL | Status: DC
  Administered 2018-07-13 – 2018-07-14 (×2): 40 mg via ORAL
  Filled 2018-07-13 (×2): qty 1

## 2018-07-13 NOTE — Progress Notes (Signed)
SATURATION QUALIFICATIONS: (This note is used to comply with regulatory documentation for home oxygen)  Patient Saturations on Room Air at Rest = 100%  Patient Saturations on Room Air while Ambulating = 98%   Please briefly explain why patient needs home oxygen: The patient did not require supplemental oxygen to maintain adequate oxygen saturations during ambulation.   Leighton Ruff, PT, DPT  Acute Rehabilitation Services  Pager: [phone] Office: [phone]

## 2018-07-13 NOTE — Progress Notes (Signed)
Inpatient Diabetes Program Recommendations  AACE/ADA: New Consensus Statement on Inpatient Glycemic Control (2015)  Target Ranges:  Prepandial:   less than 140 mg/dL      Peak postprandial:   less than 180 mg/dL (1-2 hours)      Critically ill patients:  140 - 180 mg/dL   Lab Results  Component Value Date   GLUCAP 138 (H) 07/13/2018   HGBA1C 5.5 06/26/2018  Results for HENRETTA, QUIST (MRN [id number]) as of 07/13/2018 13:08  Ref. Range 07/12/2018 11:49 07/12/2018 17:04 07/12/2018 22:33 07/13/2018 07:48 07/13/2018 12:29  Glucose-Capillary Latest Ref Range: 70 - 99 mg/dL 142 (H) 137 (H) 147 (H) 128 (H) 138 (H)    Review of Glycemic Control  Diabetes history: none noted Outpatient Diabetes medications: none Current orders for Inpatient glycemic control:Novolog SENSITIVE correction scale TID & HS  Inpatient Diabetes Program Recommendations:   Received diabetes coordinator consult for new onset diabetes. Noted that patient's  hgbA1C is 5.5%.  No diabetes medications taken at home.   Diagnosis of diabetes is a HgbA1C of 6.5% per American Diabetes Association. Will continue to follow blood sugars while in the hospital. Will need to follow up with PCP at discharge for glucose control while on steroids.   Harvel Ricks RN BSN CDE Diabetes Coordinator Pager: [phone]  8am-5pm

## 2018-07-13 NOTE — Progress Notes (Signed)
Pulm f/u note DOS: 07/12/18  S: Seen in f/u for sarcoid flare and volume overload.  No events overnight.  Leg swelling continues to improve.  O: GEN: middle aged woman in NAD HEENT: MMM, JVD improved CV: Irregular, ext warm PULM: Central wheezing, no accessory muscle use GI: Soft, +BS EXT: Some muscle wasting, 1+ L>R LE edema NEURO: moves all 4 ext to command PSYCH: AOx3, fair insight SKIN: No rashes  BUN/Cr stable   A: -Advanced sarcoidosis with suggestion of cardiac involvement by recent echo and by variable blocks on telemetry -Central airway obstruction by sarcoidosis -Intolerance to sustained high dose steroids due to HTN -Recurrent hypercapneic respiratory failure due to aforementioned central airway obstruction -Volume overloaded state of heart related to biventricular dysfunction with suspicion of cardiac sarcoid, improving -Groups 2/3/5 pulmonary HTN -Insurance issues: now has to use [facility] as OP  P: - NIV for home to use at night and PRN during day.  This patient has recurrent admissions for hypercarbic respiratory failure related to her pulmonary sarcoidosis.  A NIV used at night will help prevent readmissions.  OSA is not the cause of this disorder and BIPAP is not an option at this time.  RN to notify CM regarding - PO diuresis fine - OOB walking - Steroid taper as ordered, patient has appt Wednesday with [facility] pulmonary/cardiology to consider DMARDs and other advanced therapies - OP w/u for cardiac involvement per [facility] cardiology to prevent duplicate workup - Patient instructed to ease up on albuterol at home if causing palpitations - Will s/o, please call if questions or concerns  Erskine Emery MD Pulm/CCM

## 2018-07-13 NOTE — Progress Notes (Addendum)
PROGRESS NOTE    KHAMORA KARAN  GGE:[id number] DOB: October 21, 1960 DOA: 07/11/2018 PCP: Cassandria Anger, MD    Brief Narrative:  58 year old female who presented with respiratory distress.  She does have the significant past medical history for pulmonary and cardiac sarcoid.  Recent hospitalization for respiratory failure requiring invasive mechanical ventilation.  Rapidly progressive worsening dyspnea, to the point where she developed acute respiratory distress.  She was placed on noninvasive mechanical ventilation in the emergency department, her blood pressure was 119/96, heart rate 78, temperature 97, respiratory rate 25, 100% oxygen saturation, BiPAP 12/6, 40% FiO2.  Set expiratory wheezing, and rhonchi at bases, heart S1-S2 present and rhythmic, abdomen soft nontender, no lower extremity edema.  Arterial pH 7.13, PCO2 58, PO2 282, bicarb 22, oxygen saturation 100%.  Sodium 139, potassium 3.3, chloride 107, bicarb 16, glucose 171, BUN 29, creatinine 1.94, BNP 2992, troponin high-sensitivity 37.  Chest radiograph with hyperinflation, bilateral apical lobes scaring more right than left.  EKG 111 bpm, left axis deviation, atrial flutter with variable block, positive PACs, PVCs and right bundle branch block.   Patient was admitted to the hospital working diagnosis of acute on chronic hypercapnic respiratory failure.  Assessment & Plan:   Principal Problem:   Acute hypercapnic respiratory failure (HCC) Active Problems:   Cardiac sarcoidosis   Asthma, moderate persistent   Pulmonary sarcoidosis (HCC)   Atrial flutter (HCC)   Right middle lobe syndrome   Lactic acidosis   Elevated brain natriuretic peptide (BNP) level   Acute renal failure superimposed on stage 3 chronic kidney disease ([place])   1. Acute on chronic hypercapnic respiratory failure due to pulmonary sarcoidosis and COPD acute exacerbation. Patient tolerating well non invasive mechanical ventilation - continue systemic  steroids - continue budesonide - B-agonist bronchodilators are not really helpful for this resp problem (central airway compromise, not bronchospasm) and may contribute to arrythmia per CCM, but ok to use it if she feels it is helping - needs home NIV before can dc home, see CCM's notes, for use at night and PRN during day, this is to help prevent readmissions; OSA is not the issue and bipap not an option at this time - has [facility] pulm appt set up for next week on Wed per patient  2. Cardiac sarcoidosis/ diastolic heart failure decompensation/ LV EF 60 to 65% with reduced RV systolic function: Advanced sarcoidosis with suggestion of cardiac involvement by recent echo and by runs of PVCs. - central airway obstruction by sarcoidosis - atrial arrhythmia on the monitor w ekg suggestive for a flutter with variable block - will need further work up with heart failure clinic at [facility] - continue furosemide , ^ to 40 po bid until discharge - continue with metoprolol 12.5 mg - continue with digoxin - continue anticoagulation for atrial flutter - has [facility] cardiology appt set up for next week on Thursday per patient  3. T2DM. Will continue glucose cover and monitoring with insulin sliding scale. Patient is tolerating po well. BS"s well controlled.    4. AKi on CKD stage 3a: baseline creat is 1.2- 1.7, today creat is 1.9  5. Hypokalemia: po Kdur at 30 bid ordered   DVT prophylaxis: apixaban  Code Status: full Family Communication: no family at the bedside  Disposition Plan/ discharge barriers: dc to home, pending home non invasive mechanical ventilation, anticipate dc home in about 1 day  Kelly Splinter MD  pgr [phone] 07/13/2018, 1:17 PM   Consultants:   Pulmonary  Subjective: Patient is feeling better  Objective: Vitals:   07/13/18 0331 07/13/18 0517 07/13/18 0828 07/13/18 0908  BP:  130/86  (!) 123/91  Pulse: 76 69 75 84  Resp: (!) 28 (!) 22 18   Temp:  97.6 F  (36.4 C)    TempSrc:  Oral    SpO2: 100% 98% 97%   Weight:  53.8 kg      Intake/Output Summary (Last 24 hours) at 07/13/2018 1305 Last data filed at 07/13/2018 0400 Gross per 24 hour  Intake 360 ml  Output 750 ml  Net -390 ml   Filed Weights   07/11/18 0500 07/12/18 0438 07/13/18 0517  Weight: 53.1 kg 60.1 kg 53.8 kg    Examination:   General: Not in pain or dyspnea, deconditioned and ill looking appearing  Neurology: Awake and alert, non focal  E ENT: mild pallor, no icterus, oral mucosa moist Cardiovascular: No JVD. S1-S2 present, rhythmic, no gallops, rubs, or murmurs. 2+ bilat LE Pulmonary: decreased breath sounds bilaterally, decreased air movement, no significant wheezing or rhonchi, faint scattered rales. Gastrointestinal. Abdomen with no organomegaly, non tender, no rebound or guarding Skin. No rashes Musculoskeletal: no joint deformities     Data Reviewed: I have personally reviewed following labs and imaging studies  CBC: Recent Labs  Lab 07/11/18 0233 07/11/18 0237  WBC 8.1  --   NEUTROABS 5.2  --   HGB 15.1* 14.3  HCT 47.9* 42.0  MCV 100.0  --   PLT 165  --    Basic Metabolic Panel: Recent Labs  Lab 07/11/18 0233 07/11/18 0237 07/12/18 0527 07/13/18 0714  NA 139 134* 142 143  K 3.3* 5.2* 3.4* 3.6  CL 107  --  103 108  CO2 16*  --  23 24  GLUCOSE 171*  --  115* 121*  BUN 29*  --  36* 43*  CREATININE 1.94*  --  1.94* 1.92*  CALCIUM 9.2  --  9.1 9.1  MG  --   --  1.7 2.5*   GFR: Estimated Creatinine Clearance: 27.1 mL/min (A) (by C-G formula based on SCr of 1.92 mg/dL (H)). Liver Function Tests: Recent Labs  Lab 07/11/18 0233  AST 54*  ALT 51*  ALKPHOS 134*  BILITOT 2.4*  PROT 6.9  ALBUMIN 3.5   No results for input(s): LIPASE, AMYLASE in the last 168 hours. No results for input(s): AMMONIA in the last 168 hours. Coagulation Profile: No results for input(s): INR, PROTIME in the last 168 hours. Cardiac Enzymes: No results for  input(s): CKTOTAL, CKMB, CKMBINDEX, TROPONINI in the last 168 hours. BNP (last 3 results) No results for input(s): PROBNP in the last 8760 hours. HbA1C: No results for input(s): HGBA1C in the last 72 hours. CBG: Recent Labs  Lab 07/12/18 1149 07/12/18 1704 07/12/18 2233 07/13/18 0748 07/13/18 1229  GLUCAP 142* 137* 147* 128* 138*   Lipid Profile: No results for input(s): CHOL, HDL, LDLCALC, TRIG, CHOLHDL, LDLDIRECT in the last 72 hours. Thyroid Function Tests: No results for input(s): TSH, T4TOTAL, FREET4, T3FREE, THYROIDAB in the last 72 hours. Anemia Panel: No results for input(s): VITAMINB12, FOLATE, FERRITIN, TIBC, IRON, RETICCTPCT in the last 72 hours.    Radiology Studies: I have reviewed all of the imaging during this hospital visit personally     Scheduled Meds: . apixaban  2.5 mg Oral BID  . budesonide (PULMICORT) nebulizer solution  0.5 mg Nebulization BID  . digoxin  0.125 mg Oral Daily  . famotidine  20 mg  Oral Q supper  . furosemide  40 mg Oral Daily  . insulin aspart  0-9 Units Subcutaneous TID WC  . ipratropium-albuterol  3 mL Nebulization Q6H  . loratadine  10 mg Oral Daily  . mouth rinse  15 mL Mouth Rinse BID  . metoprolol tartrate  12.5 mg Oral BID  . predniSONE  40 mg Oral Q breakfast   Followed by  . [START ON 07/18/2018] predniSONE  20 mg Oral Q breakfast   Followed by  . [START ON 07/25/2018] predniSONE  10 mg Oral Q breakfast   Continuous Infusions:   LOS: 2 days

## 2018-07-13 NOTE — Progress Notes (Signed)
RT NOTES: Attempted to obtain ABG as ordered. Unsuccessful after multiple redirects. Patient refused any more attempts, offered to get another RT to try and patient refused. RN Venora Maples notified.

## 2018-07-13 NOTE — TOC Initial Note (Signed)
Transition of Care [facility]) - Initial/Assessment Note    Patient Details  Name: [name] MRN: [id number] Date of Birth: November 05, 1960  Transition of Care [facility]) CM/SW Contact:    Bethena Roys, RN Phone Number: 07/13/2018, 1:16 PM  Clinical Narrative: Pt presented for SOB- Respiratory failure. PTA from home was previously in the hospital and set up for outpatient rehab services. Pt has DME Oxygen, RW, Nebulizer machine and tub bench. Pt in need of DME Trilogy vent. CM did page MD on call for critical care- no return phone call back. Patient will need an additional ABG and CM can order the Trilogy. CM will continue to monitor for DME and home needs.                   Expected Discharge Plan: [place] Barriers to Discharge: Continued Medical Work up   Patient Goals and CMS Choice        Expected Discharge Plan and Services Expected Discharge Plan: [place] In-house Referral: NA Discharge Planning Services: CM Consult Post Acute Care Choice: Durable Medical Equipment Living arrangements for the past 2 months: Single Family Home                 DME Arranged: Ventilator(Trilogy Vent) DME Agency: [employer] Date DME Agency Contacted: 07/13/18 Time DME Agency Contacted: [phone] Representative spoke with at DME Agency: [place]            Prior Living Arrangements/Services Living arrangements for the past 2 months: [place] with:: Self(sister had been visiting) Patient language and need for interpreter reviewed:: Yes Do you feel safe going back to the place where you live?: Yes      Need for Family Participation in Patient Care: Yes (Comment) Care giver support system in place?: Yes (comment) Current home services: DME(HAs nebulizer machine, oxygen, RW and tub bench) Criminal Activity/Legal Involvement Pertinent to Current Situation/Hospitalization: No - Comment as needed  Activities of Daily Living       Permission Sought/Granted Permission sought to share information with : Family Supports, Chartered certified accountant granted to share information with : Yes, Verbal Permission Granted     Permission granted to share info w AGENCY: [employer]        Emotional Assessment Appearance:: Appears stated age Attitude/Demeanor/Rapport: Engaged Affect (typically observed): Accepting Orientation: : Oriented to Self, Oriented to Place, Oriented to  Time, Oriented to Situation Alcohol / Substance Use: Not Applicable Psych Involvement: No (comment)  Admission diagnosis:  Sarcoidosis [D86.9] Acute respiratory failure with hypoxia (HCC) [J96.01] Acute renal failure superimposed on stage 3 chronic kidney disease, unspecified acute renal failure type ([place]) [N17.9, N18.3] Patient Active Problem List   Diagnosis Date Noted  . Right middle lobe syndrome 07/11/2018  . Lactic acidosis 07/11/2018  . Elevated brain natriuretic peptide (BNP) level 07/11/2018  . Acute renal failure superimposed on stage 3 chronic kidney disease ([place]) 07/11/2018  . Acute hypercapnic respiratory failure ([place]) 06/24/2018  . Pulmonary sarcoidosis ([place])   . PUD (peptic ulcer disease)   . Multiple thyroid nodules   . Heart murmur   . Depression   . Cataract   . Atrial flutter ([place])   . Anxiety   . Allergy   . AAA (abdominal aortic aneurysm) ([place])   . Weight loss 02/29/2016  . Atrial fibrillation ([place]) 01/23/2016  . Tubular adenoma of colon 07/15/2015  . Acute maxillary sinusitis 04/08/2015  . Colon cancer screening 02/21/2015  .  Abdominal pain, left lower quadrant 06/20/2014  . Glaucoma 05/31/2014  . Loose stools 05/31/2014  . Dislocation of finger PIP joint 01/03/2014  . Finger pain, right 12/28/2013  . B12 deficiency 12/15/2013  . Hypercalcemia 09/10/2013  . Creatinine elevation 09/10/2013  . Nonallopathic lesion of cervical region 06/11/2013  . Elevated LFTs 05/21/2013  . Greater trochanteric  bursitis of right hip 05/14/2013  . Hypokalemia 04/12/2013  . Right acetabular fracture ([place]) 04/02/2013  . Acute right hip pain 03/31/2013  . Right sciatic nerve pain 02/26/2013  . Neck pain on right side 02/26/2013  . Exposure to the flu 01/25/2013  . Fall due to stumbling 07/24/2012  . Onychomycosis 04/27/2012  . Dizziness 01/14/2012  . Abdominal pain, left upper quadrant 09/30/2011  . Irritable bladder 09/02/2011  . Hyperglycemia 09/02/2011  . Nasal turbinate hypertrophy 03/08/2011  . Hypersomnia 09/12/2010  . VAGINITIS 02/27/2010  . Headache(784.0) 02/13/2010  . TMJ PAIN 10/20/2009  . GRIEF REACTION 02/23/2009  . PERIMENOPAUSAL SYNDROME 05/24/2008  . FATIGUE 12/07/2007  . TACHYCARDIA 12/07/2007  . GANGLION CYST, WRIST, LEFT 07/17/2007  . 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 Allergic rhinitis 03/24/2007  . Osteoarthritis 03/24/2007  . HAND PAIN 03/24/2007  . TINEA PEDIS 12/10/2006  . Depression with anxiety 12/10/2006  . Asthma, moderate persistent 12/10/2006  . GLAUCOMA NOS 11/06/2006  . Essential hypertension 11/06/2006  . OSTEOPOROSIS 11/06/2006   PCP:  Cassandria Anger, MD Pharmacy:   [id number] - [place], [address] [place] [address] Phone: [phone] Fax: [phone]     Social Determinants of Health (SDOH) Interventions    Readmission Risk Interventions No flowsheet data found.

## 2018-07-13 NOTE — Evaluation (Signed)
Physical Therapy Evaluation Patient Details Name: [name] MRN: [id number] DOB: 15-Oct-1960 Today's Date: 07/13/2018   History of Present Illness  Pt is a 58 y/o female admitted secondary to worsening SOB and acute hypercapnic respiratory failure. PMH includes pulmonary and cardiac sarcoidosis, asthma, DM, HTN, and CKD.   Clinical Impression  Pt admitted secondary to problem above with deficits below. Pt with mild SOB during gait, however, oxygen sats WFL on RA throughout mobility tasks. Required min guard A for mobility using RW this session. Pt reports her sister will be staying with her at d/c. Will continue to follow acutely to maximize functional mobility independence and safety.     Follow Up Recommendations Home health PT;Supervision for mobility/OOB    Equipment Recommendations  None recommended by PT    Recommendations for Other Services       Precautions / Restrictions Precautions Precautions: Fall Restrictions Weight Bearing Restrictions: No      Mobility  Bed Mobility Overal bed mobility: Needs Assistance Bed Mobility: Supine to Sit     Supine to sit: Supervision     General bed mobility comments: Supervision for safety and line management.   Transfers Overall transfer level: Needs assistance Equipment used: Rolling walker (2 wheeled) Transfers: Sit to/from Stand Sit to Stand: Min guard         General transfer comment: Min guard for safety.   Ambulation/Gait Ambulation/Gait assistance: Min guard Gait Distance (Feet): 75 Feet Assistive device: Rolling walker (2 wheeled) Gait Pattern/deviations: Step-through pattern;Decreased stride length Gait velocity: Decreased   General Gait Details: Slow, cautious gait with use of RW. Mild SOB noted, however, oxygen sats WFL on RA throughout.   Stairs            Wheelchair Mobility    Modified Rankin (Stroke Patients Only)       Balance Overall balance assessment: Mild deficits observed,  not formally tested                                           Pertinent Vitals/Pain Pain Assessment: No/denies pain    Home Living Family/patient expects to be discharged to:: Private residence Living Arrangements: Other relatives(sister) Available Help at Discharge: Family Type of Home: House Home Access: Stairs to enter Entrance Stairs-Rails: None Entrance Stairs-Number of Steps: 1 Home Layout: One level Home Equipment: Environmental consultant - 2 wheels;Bedside commode;Shower seat      Prior Function Level of Independence: Independent with assistive device(s)         Comments: Was using RW for ambulation      Hand Dominance        Extremity/Trunk Assessment   Upper Extremity Assessment Upper Extremity Assessment: Overall WFL for tasks assessed    Lower Extremity Assessment Lower Extremity Assessment: Generalized weakness    Cervical / Trunk Assessment Cervical / Trunk Assessment: Normal  Communication   Communication: No difficulties  Cognition Arousal/Alertness: Awake/alert Behavior During Therapy: WFL for tasks assessed/performed Overall Cognitive Status: Within Functional Limits for tasks assessed                                        General Comments General comments (skin integrity, edema, etc.): Brief period of HR up to 130 bpm at end of gait. Pt reported sensation of heart fluttering. Very brief  and returned to low 90s.     Exercises     Assessment/Plan    PT Assessment Patient needs continued PT services  PT Problem List Decreased strength;Decreased balance;Decreased activity tolerance;Decreased mobility;Decreased knowledge of use of DME       PT Treatment Interventions DME instruction;Gait training;Functional mobility training;Therapeutic activities;Therapeutic exercise;Balance training;Patient/family education;Stair training    PT Goals (Current goals can be found in the Care Plan section)  Acute Rehab PT  Goals Patient Stated Goal: to get stronger PT Goal Formulation: With patient Time For Goal Achievement: 07/27/18 Potential to Achieve Goals: Good    Frequency Min 3X/week   Barriers to discharge        Co-evaluation               AM-PAC PT "6 Clicks" Mobility  Outcome Measure Help needed turning from your back to your side while in a flat bed without using bedrails?: None Help needed moving from lying on your back to sitting on the side of a flat bed without using bedrails?: A Little Help needed moving to and from a bed to a chair (including a wheelchair)?: A Little Help needed standing up from a chair using your arms (e.g., wheelchair or bedside chair)?: A Little Help needed to walk in hospital room?: A Little Help needed climbing 3-5 steps with a railing? : A Little 6 Click Score: 19    End of Session   Activity Tolerance: Patient tolerated treatment well Patient left: in chair;with call bell/phone within reach Nurse Communication: Mobility status PT Visit Diagnosis: Muscle weakness (generalized) (M62.81)    Time: 2482-5003 PT Time Calculation (min) (ACUTE ONLY): 18 min   Charges:   PT Evaluation $PT Eval Low Complexity: [place], PT, DPT  Acute Rehabilitation Services  Pager: [phone] Office: [phone]   Rudean Hitt 07/13/2018, 4:01 PM

## 2018-07-14 ENCOUNTER — Inpatient Hospital Stay (HOSPITAL_COMMUNITY): Payer: BLUE CROSS/BLUE SHIELD

## 2018-07-14 DIAGNOSIS — D869 Sarcoidosis, unspecified: Secondary | ICD-10-CM

## 2018-07-14 LAB — SPIROMETRY WITH GRAPH
FEF 25-75 Pre: 0.31 L/sec
FEF2575-%Pred-Pre: 16 %
FEV1-%Pred-Pre: 39 %
FEV1-Pre: 0.71 L
FEV1FVC-%Pred-Pre: 67 %
FEV6-%Pred-Pre: 58 %
FEV6-Pre: 1.27 L
FEV6FVC-%Pred-Pre: 100 %
FVC-%Pred-Pre: 57 %
FVC-Pre: 1.32 L
Pre FEV1/FVC ratio: 54 %
Pre FEV6/FVC Ratio: 97 %

## 2018-07-14 LAB — GLUCOSE, CAPILLARY
Glucose-Capillary: 124 mg/dL — ABNORMAL HIGH (ref 70–99)
Glucose-Capillary: 139 mg/dL — ABNORMAL HIGH (ref 70–99)

## 2018-07-14 MED ORDER — METOPROLOL TARTRATE 25 MG PO TABS: 12.5000 mg | ORAL_TABLET | Freq: Two times a day (BID) | ORAL | 1 refills | Status: AC

## 2018-07-14 MED ORDER — PREDNISONE 10 MG PO TABS: 10.0000 mg | ORAL_TABLET | Freq: Every day | ORAL | 3 refills | Status: AC

## 2018-07-14 MED ORDER — PREDNISONE 20 MG PO TABS: 40.0000 mg | ORAL_TABLET | Freq: Every day | ORAL | 0 refills | Status: AC

## 2018-07-14 MED ORDER — FUROSEMIDE 40 MG PO TABS: 40.0000 mg | ORAL_TABLET | Freq: Every day | ORAL | Status: DC

## 2018-07-14 MED ORDER — LORATADINE 10 MG PO TABS: 10.0000 mg | ORAL_TABLET | Freq: Every day | ORAL | 5 refills | Status: AC

## 2018-07-14 MED ORDER — FUROSEMIDE 40 MG PO TABS: 40.0000 mg | ORAL_TABLET | Freq: Every day | ORAL | 1 refills | Status: AC

## 2018-07-14 MED ORDER — PREDNISONE 20 MG PO TABS: 20.0000 mg | ORAL_TABLET | Freq: Every day | ORAL | 0 refills | Status: AC

## 2018-07-14 MED ORDER — BUDESONIDE 0.5 MG/2ML IN SUSP: 0.5000 mg | Freq: Two times a day (BID) | RESPIRATORY_TRACT | 12 refills | Status: AC

## 2018-07-14 NOTE — Progress Notes (Signed)
Pt placed back on BIPAP after coming off due to panic attack.. PT tolerating well at this time.  RT will continue to monitor.

## 2018-07-14 NOTE — Progress Notes (Signed)
Pt was placed back on BIPAP and tolerating well but within a few minutes had taken BIPAP back off stated she just couldn't tolerate it.  Her heart rate increased into the 120's. Bipap placed on standby, pt began to calm down.  RT will continue to monitor.

## 2018-07-14 NOTE — TOC Transition Note (Signed)
Transition of Care [facility]) - CM/SW Discharge Note   Patient Details  Name: [name] MRN: [id number] Date of Birth: 1960/03/23  Transition of Care [facility]) CM/SW Contact:  Bethena Roys, RN Phone Number: 07/14/2018, 4:18 PM   Clinical Narrative:   CM worked diligently to see if she could get patient approved for Trilogy vent. Pt did not qualify due to PFT numbers and patient declines to get another ABG. Staff RN and MD made aware. CM called several [place] Agencies to schedule HH PT- no one would accept the patient at this time. CM even called [employer] 1-800 number and could not get assistance from a case manager regarding d/c planning. Patient is aware to have PCP set her up with a [facility] agency. Nurse at PCP office has been trying to, however has had no luck. CM did make patient aware when open enrollment begins to look for another insurance that will best fit her and her healthcare needs.     Barriers to Discharge: No Barriers Identified(unable to get patient approved for Trilogy vent. Pt declined 2nd ABG.)   Patient Goals and CMS Choice     Choice offered to / list presented to : Patient  Discharge Placement                       Discharge Plan and Services In-house Referral: NA Discharge Planning Services: CM Consult Post Acute Care Choice: Home Health          DME Arranged: (Unable to get the patient approved for Triology) DME Agency: [employer] Date DME Agency Contacted: 07/13/18 Time DME Agency Contacted: [phone] Representative spoke with at DME Agency: [place]: (Unable to get a HH [facility] scheduled due to insurance.)          Social Determinants of Health (SDOH) Interventions     Readmission Risk Interventions No flowsheet data found.

## 2018-07-14 NOTE — Progress Notes (Signed)
Physical Therapy Treatment Patient Details Name: [name] MRN: [id number] DOB: 10-Sep-1960 Today's Date: 07/14/2018    History of Present Illness Pt is a 58 y/o female admitted secondary to worsening SOB and acute hypercapnic respiratory failure. PMH includes pulmonary and cardiac sarcoidosis, asthma, DM, HTN, and CKD.     PT Comments    Pt EOB on arrival and sharing that she had a panic attack last night but that she feels better today and very willing to walk. Pt with increased gait tolerance today with continued use of RW and spO2 >98% on RA. Pt to [facility] end of session with prolonged need to void and therefore HEP deferred with RN aware of mobility and position. Will continue to follow and encouraged mobility with nursing assist.     Follow Up Recommendations  Home health PT;Supervision for mobility/OOB     Equipment Recommendations  None recommended by PT    Recommendations for Other Services       Precautions / Restrictions Precautions Precautions: Fall    Mobility  Bed Mobility               General bed mobility comments: EOB on arrival  Transfers Overall transfer level: Modified independent   Transfers: Sit to/from Stand           General transfer comment: pt able to stand from bed and to [facility] without physical assist or cues  Ambulation/Gait Ambulation/Gait assistance: Supervision Gait Distance (Feet): 250 Feet Assistive device: Rolling walker (2 wheeled) Gait Pattern/deviations: Step-through pattern;Decreased stride length   Gait velocity interpretation: 1.31 - 2.62 ft/sec, indicative of limited community ambulator General Gait Details: slow, cautious gait with cues for posture, position in RW with encouragement to maximize distance. pt with SpO 2 98% on RA with gait, HR 79-88   Stairs             Wheelchair Mobility    Modified Rankin (Stroke Patients Only)       Balance Overall balance assessment: Mild deficits observed, not  formally tested                                          Cognition Arousal/Alertness: Awake/alert Behavior During Therapy: WFL for tasks assessed/performed Overall Cognitive Status: Within Functional Limits for tasks assessed                                        Exercises      General Comments        Pertinent Vitals/Pain Pain Assessment: No/denies pain    Home Living                      Prior Function            PT Goals (current goals can now be found in the care plan section) Progress towards PT goals: Progressing toward goals    Frequency    Min 3X/week      PT Plan Current plan remains appropriate    Co-evaluation              AM-PAC PT "6 Clicks" Mobility   Outcome Measure  Help needed turning from your back to your side while in a flat bed without using bedrails?: None Help needed moving from lying on  your back to sitting on the side of a flat bed without using bedrails?: None Help needed moving to and from a bed to a chair (including a wheelchair)?: None Help needed standing up from a chair using your arms (e.g., wheelchair or bedside chair)?: A Little Help needed to walk in hospital room?: A Little Help needed climbing 3-5 steps with a railing? : A Little 6 Click Score: 21    End of Session Equipment Utilized During Treatment: Gait belt Activity Tolerance: Patient tolerated treatment well Patient left: Other (comment);with call bell/phone within reach(BSC with RN aware) Nurse Communication: Mobility status PT Visit Diagnosis: Muscle weakness (generalized) (M62.81);Other abnormalities of gait and mobility (R26.89)     Time: 7579-7282 PT Time Calculation (min) (ACUTE ONLY): 32 min  Charges:  $Gait Training: 8-22 mins $Therapeutic Activity: 8-22 mins                     [place], PT Acute Rehabilitation Services Pager: [phone] Office: [place] 07/14/2018, 12:54 PM

## 2018-07-14 NOTE — Progress Notes (Signed)
Pt was place on bipap at approximately 2345- at [place] nonsustained up to 150's- upon checking on pt she had taken off the bipap and said she was feeling a lot of anxiety-"like a panic attack" from the bipap. She has Atarax on her MAR, but did not want it due to "makes her have tremors," she stated that tylenol always helps her calm down. While in the room after administering the tylenol pt HR back down in the 60's-80's. Plan to continue to let her calm down and call respiratory to place the bipap after about 20-30 mins.

## 2018-07-14 NOTE — Discharge Summary (Addendum)
Physician Discharge Summary  Patient ID: [name] MRN: [id number] DOB/AGE: 18-Sep-1960 58 y.o.  Admit date: 07/11/2018 Discharge date: 07/14/2018  Admission Diagnoses: Acute Hypoxic/Hypercarbic Respiratory Failure presumed secondary to Sarcoid Flare H/O Pulmonary Sarcoidosis H/O Asthma Hypertension H/O Atrial fib Acute on Chronic Kidney Disease Stage 3 Lactic Acidosis secondary to hypoperfusion  H/O PUD Hyperglycemia secondary to steroids   Discharge Diagnoses:  Principal Problem:   Acute hypercapnic respiratory failure (HCC) Active Problems:   Pulmonary sarcoidosis (HCC)   Cardiac sarcoidosis   Atrial flutter (HCC)   Acute renal failure superimposed on stage 3 chronic kidney disease (HCC)   CKD stage 3 secondary to diabetes (HCC)   Asthma, moderate persistent   Right middle lobe syndrome   Lactic acidosis   Elevated brain natriuretic peptide (BNP) level   Type 2 diabetes mellitus with hemoglobin A1c goal of less than 7.0% (HCC)   Discharged Condition: fair  HPI: 58 year old female who presented with respiratory distress.  She does have the significant past medical history for pulmonary and cardiac sarcoid.  Recent hospitalization for respiratory failure requiring invasive mechanical ventilation.  Rapidly progressive worsening dyspnea, to the point where she developed acute respiratory distress.  She was placed on noninvasive mechanical ventilation in the emergency department, her blood pressure was 119/96, heart rate 78, temperature 97, respiratory rate 25, 100% oxygen saturation, BiPAP 12/6, 40% FiO2.  Set expiratory wheezing, and rhonchi at bases, heart S1-S2 present and rhythmic, abdomen soft nontender, no lower extremity edema.  Arterial pH 7.13, PCO2 58, PO2 282, bicarb 22, oxygen saturation 100%.  Sodium 139, potassium 3.3, chloride 107, bicarb 16, glucose 171, BUN 29, creatinine 1.94, BNP 2992, troponin high-sensitivity 37.  Chest radiograph with hyperinflation,  bilateral apical lobes scaring more right than left.  EKG 111 bpm, left axis deviation, atrial flutter with variable block, positive PACs, PVCs and right bundle branch block.   Patient was admitted to the hospital working diagnosis of acute on chronic hypercapnic respiratory failure.  Hospital Course:  1. Acute on chronic hypercapnic respiratory failure due to pulmonary sarcoidosis and COPD acute exacerbation - seen by CCM/ Pulm Dr D. Smith, patient's sarcoid has remodeled her airways, she has issues w/ diffuse upper airway obstruction and need positive pressure ventilation at home to help her w/ sleep.  Per CCM bronchodilators won't help since this is not a small airway issue. - she needs to be seen by [facility] pulm and has 1st appt tomorrow 7/1   - we attempted to get patient a positive pressure ventilator for home use, but ABG could not be obtained for bipap and pt needed certain PFT values for the Trilogy machine and she didn't qualify, therefore is being sent home w/o positive pressure ventilator device at home, she should f/t with pulm at [facility] about this and if becomes SOB at night should go to [facility] ED - prednisone taper as ordered - continue budesonide  2. Cardiac sarcoidosis ?/ diastolic heart failure decompensation/ LV EF 60 to 65% with reduced RV systolic function: Advanced sarcoidosis with suggestion of cardiac involvement by recent echo and by runs of PVCs. - central airway obstruction by sarcoidosis, see #1 - atrial arrhythmia on the monitor w ekg > a flutter with variable block - will need further work up with heart failure clinic at [facility] - continue furosemide 40 qd at dc - continue with metoprolol 12.5 mg bid - continue with digoxin - continue anticoagulation for atrial flutter eliquis 2.5 bid as prior to admit -  has [facility] cardiology appt set up for next week on Thursday 07/22/18 per patient  3. T2DM- used low dose SSI while here, BS's low 100's to 150 max. No meds at  dc.    4. AKi on CKD stage 3a: baseline creat is 1.2- 1.7, today creat is 1.9  5. Hypokalemia: po KCl 20 /day at dc   Consultants:   Pulmonary    Discharge Exam: Blood pressure (!) 151/85, pulse 83, temperature (!) 97.4 F (36.3 C), temperature source Oral, resp. rate (!) 22, weight 54 kg, SpO2 99 %.  General: Not in pain or dyspnea, deconditioned and ill looking appearing  Neurology: Awake and alert, non focal  E ENT: mild pallor, no icterus, oral mucosa moist Cardiovascular: No JVD. S1-S2 present, rhythmic, no gallops, rubs, or murmurs. 2+ bilat LE Pulmonary: decreased breath sounds bilaterally, decreased air movement, no significant wheezing or rhonchi, faint scattered rales. Gastrointestinal. Abdomen with no organomegaly, non tender, no rebound or guarding Skin. No rashes Musculoskeletal: no joint deformities  Disposition: Discharge disposition: 01-Home or Self Care        Allergies as of 07/14/2018      Reactions   Bystolic [nebivolol Hcl] Shortness Of Breath   Doxycycline Shortness Of Breath   Amoxicillin-pot Clavulanate    REACTION: GI symptoms   Amoxicillin-pot Clavulanate Other (See Comments)   REACTION: GI symptoms   Beta Adrenergic Blockers Other (See Comments)   Pt states she is not to be prescribed any medications ending in -lol because they cause her dizziness, lethargy and tongue swelling.   Cardizem [diltiazem Hcl] Other (See Comments)   HAs Pt can take Norvasc HAs Pt can take Norvasc   Carvedilol Other (See Comments)   REACTION: tired REACTION: tired   Cefdinir Diarrhea   Cephalexin Diarrhea   REACTION: unspecified   Hydrochlorothiazide Other (See Comments)   Dropped BP and caused hair loss    Influenza Vaccines Other (See Comments)   Allergic to flu vaccinations per pt.  Soreness and "cold symptoms" per pt Allergic to flu vaccinations per pt.  Soreness and "cold symptoms" per pt   Metoprolol Swelling   Cold feet   Pneumovax  [pneumococcal Polysaccharide Vaccine]    ?reaction   Prednisone Hypertension, Other (See Comments)   Sulfamethoxazole    Other reaction(s): Unknown REACTION: unspecified REACTION: unspecified   Sulfasalazine    Other reaction(s): Unknown   Sulfonamide Derivatives    Verapamil Other (See Comments)   fatigue fatigue   Eliquis [apixaban] Rash      Medication List    STOP taking these medications   fexofenadine 180 MG tablet Commonly known as: ALLEGRA Replaced by: loratadine 10 MG tablet   hydrOXYzine 50 MG capsule Commonly known as: Vistaril     TAKE these medications   albuterol 108 (90 Base) MCG/ACT inhaler Commonly known as: VENTOLIN HFA Inhale 1 puff into the lungs every 6 (six) hours as needed for shortness of breath.   albuterol (2.5 MG/3ML) 0.083% nebulizer solution Commonly known as: PROVENTIL Take 3 mLs (2.5 mg total) by nebulization every 4 (four) hours as needed for wheezing or shortness of breath.   budesonide 0.5 MG/2ML nebulizer solution Commonly known as: PULMICORT Take 2 mLs (0.5 mg total) by nebulization 2 (two) times daily.   digoxin 0.125 MG tablet Commonly known as: LANOXIN Take 1 tablet (0.125 mg total) by mouth daily. What changed: additional instructions   Eliquis 2.5 MG Tabs tablet Generic drug: apixaban Take 2.5 mg by mouth 2 (two)  times daily.   furosemide 40 MG tablet Commonly known as: LASIX Take 1 tablet (40 mg total) by mouth daily. Start taking on: July 15, 2018 What changed:   medication strength  how much to take   HAIR SKIN & NAILS ADVANCED PO Take 2 capsules by mouth daily.   loratadine 10 MG tablet Commonly known as: CLARITIN Take 1 tablet (10 mg total) by mouth daily. Start taking on: July 15, 2018 Replaces: fexofenadine 180 MG tablet   MELATONIN PO Take 1 tablet by mouth at bedtime.   metoprolol tartrate 25 MG tablet Commonly known as: LOPRESSOR Take 0.5 tablets (12.5 mg total) by mouth 2 (two) times daily.    predniSONE 20 MG tablet Commonly known as: DELTASONE Take 2 tablets (40 mg total) by mouth daily with breakfast for 2 days. Start taking on: July 15, 2018   predniSONE 20 MG tablet Commonly known as: DELTASONE Take 1 tablet (20 mg total) by mouth daily with breakfast for 7 days. Start taking on: July 18, 2018   predniSONE 10 MG tablet Commonly known as: DELTASONE Take 1 tablet (10 mg total) by mouth daily with breakfast. Start taking on: July 25, 2018   Vitamin B12 500 MCG Tabs Take 500 mcg by mouth daily. Reported on 06/20/2015      Follow-up Information    Reita Cliche, MD Follow up on 07/24/2018.   Specialty: Internal Medicine Why: @ 9:45 am for hosptial follow up appointment wih Primary Care Provider-If you need to reschedule please call the office.  Contact information: [address] [place] [address] [phone]        Lafonda Mosses, MD Follow up on 07/15/2018.   Specialty: Pulmonary Disease Why: @ 2:40 pm for hospital follow up appointment. Please arrive at 2:25 pm. Please discuss with pulmonary that you will need a trilogy vent for home.  Contact information: [place] [address] [phone]           Signed: Sol Blazing 07/14/2018, 2:34 PM

## 2018-07-15 NOTE — Care Management (Addendum)
1006 07-15-18 Late Entry: CM pursued home health services for patient due to her respiratory status- respiratory failure. Pt is in need of trilogy vent for prn use in the day and at night. CM did discuss returning to outpatient rehab services, however patient felt like she would benefit from [facility] services due to respiratory status and limited transportation. Patient has home oxygen via [employer]- CM did reach back out to [employer] 07-15-18 and they are continuing to work on [employer] for patient. CM did call the MD Gallemore's office as well to see if message can be sent to Bricelyn at the office to continue to work on [employer] to see if insurance will approve. No further needs from CM at this time. Bethena Roys, RN,BSN Case Manager [phone]

## 2018-07-16 LAB — CULTURE, BLOOD (ROUTINE X 2)
Culture: NO GROWTH
Culture: NO GROWTH
Special Requests: ADEQUATE

## 2019-02-11 ENCOUNTER — Telehealth: Payer: Self-pay | Admitting: Hospice

## 2019-02-11 NOTE — Telephone Encounter (Signed)
[facility] Palliative appt scheduled for 02-18-19 at 1:00. SBB

## 2019-02-16 ENCOUNTER — Telehealth: Payer: Self-pay

## 2019-02-16 NOTE — Telephone Encounter (Signed)
Received message that patient's sister called and would like a call back. Phone call placed to sister who stated that she was told that there was nothing more they could do in the hospital. Sister inquired if that means if patient is retaining fluid, will they do anything for her? Discussed treatment for symptoms vs treatment of disease. Sister expressed understanding. Visit with Palliative NP confirmed for Thursday 02/18/2019

## 2019-02-18 ENCOUNTER — Other Ambulatory Visit: Payer: Self-pay

## 2019-02-18 ENCOUNTER — Other Ambulatory Visit: Payer: BLUE CROSS/BLUE SHIELD | Admitting: Hospice

## 2019-02-18 MED ORDER — MILRINONE LACTATE IN DEXTROSE 40-5 MG/200ML-% IV SOLN
0.13 | INTRAVENOUS | Status: DC
Start: ? — End: 2019-02-18

## 2019-02-18 MED ORDER — DIGOXIN 125 MCG PO TABS
0.13 | ORAL_TABLET | ORAL | Status: DC
Start: 2019-02-22 — End: 2019-02-18

## 2019-02-18 MED ORDER — PREDNISONE 10 MG PO TABS
10.00 | ORAL_TABLET | ORAL | Status: DC
Start: 2019-02-22 — End: 2019-02-18

## 2019-02-18 MED ORDER — POTASSIUM CHLORIDE 20 MEQ PO PACK
20.00 | PACK | ORAL | Status: DC
Start: 2019-02-22 — End: 2019-02-18

## 2019-02-18 MED ORDER — MELATONIN 3 MG PO TABS
3.00 | ORAL_TABLET | ORAL | Status: DC
Start: ? — End: 2019-02-18

## 2019-02-20 MED ORDER — ALPRAZOLAM 0.25 MG PO TABS
0.25 | ORAL_TABLET | ORAL | Status: DC
Start: ? — End: 2019-02-20

## 2019-02-20 MED ORDER — GENERIC EXTERNAL MEDICATION
80.00 | Status: DC
Start: 2019-02-21 — End: 2019-02-20

## 2019-02-20 MED ORDER — ACETAMINOPHEN 500 MG PO TABS
1000.00 | ORAL_TABLET | ORAL | Status: DC
Start: ? — End: 2019-02-20

## 2019-02-20 MED ORDER — GENERIC EXTERNAL MEDICATION
10.00 | Status: DC
Start: ? — End: 2019-02-20

## 2019-02-22 MED ORDER — CETIRIZINE HCL 5 MG PO TABS
5.00 | ORAL_TABLET | ORAL | Status: DC
Start: 2019-02-21 — End: 2019-02-22

## 2019-02-24 ENCOUNTER — Telehealth: Payer: Self-pay

## 2019-02-24 NOTE — Telephone Encounter (Signed)
Palliative care visit scheduled for 02/18/2019 not made as patient is in the hospital.

## 2019-03-15 DEATH — deceased

## 2019-05-24 IMAGING — MR MR ABDOMEN WO/W CM
10 of 18 series · 21 of 48 positions shown · IV contrast (6 GAD)
Comparison: Multiple exams, including 05/13/2016 MRI

CLINICAL DATA: Thoracic and intra-abdominal sarcoidosis. Liver mass
in 5812 was biopsied revealing noncaseating granuloma. Occasional
periumbilical pain.

EXAM:
MRI ABDOMEN WITHOUT AND WITH CONTRAST
TECHNIQUE: Multiplanar multisequence MR imaging of the abdomen was performed
both before and after the administration of intravenous contrast.
CONTRAST:  6 cc Gadavist

[Series 4: cor ssfse nav · coronal · 6.0mm · 0.59mm/px · 2 of 31 slices shown]
[im 1/31]
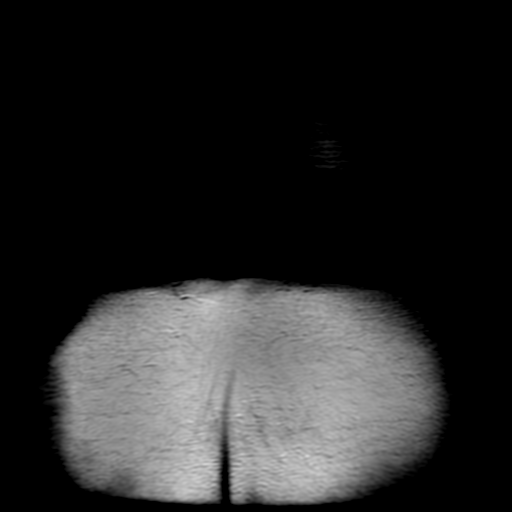
[im 31/31]
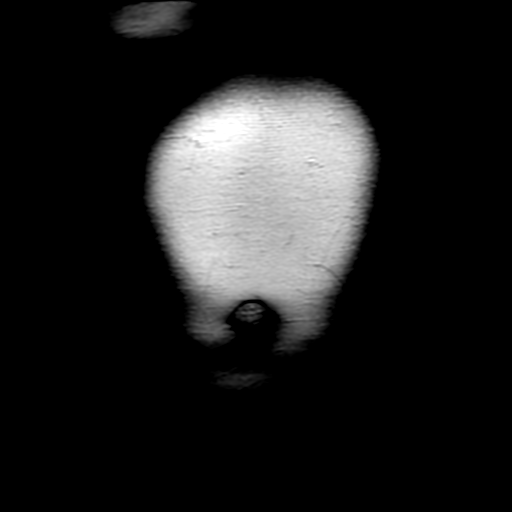

[Series 6: ax ssfse nav · axial · 6.0mm · 0.59mm/px · z∈[-22,+188]mm · 2 of 36 slices shown]
[im 1/36]
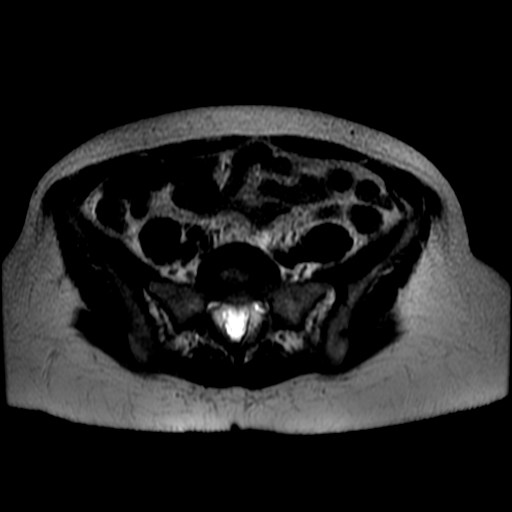
[im 36/36]
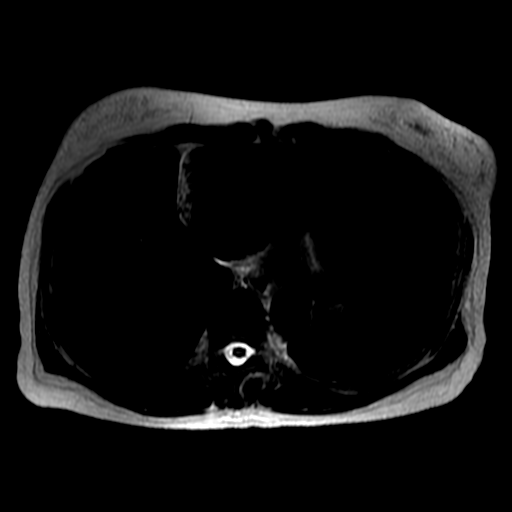

[Series 7: T2 fat-sat · axial · 6.0mm · 0.59mm/px · z∈[-22,+188]mm · 2 of 36 slices shown]
[im 1/36]
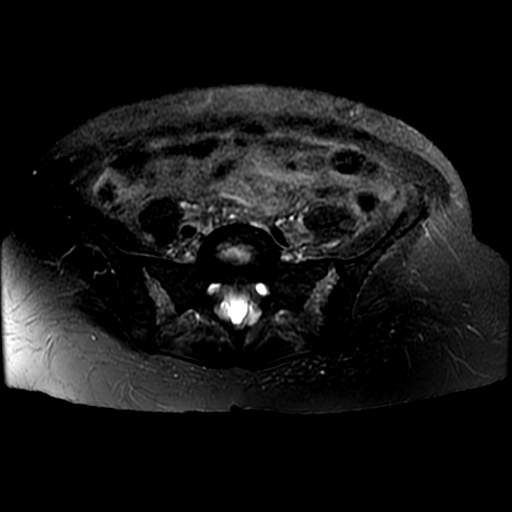
[im 36/36]
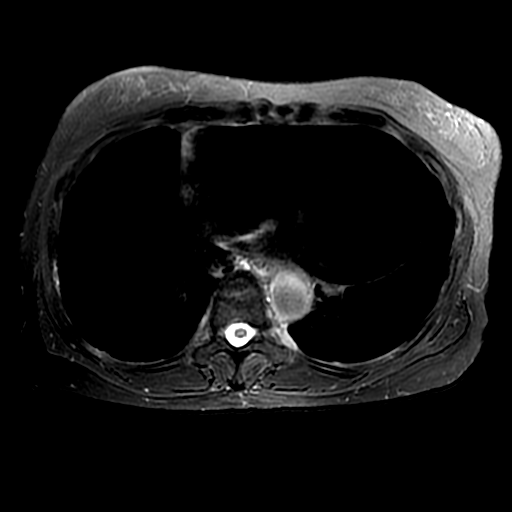

[Series 8: DWI b500 · axial · 8.0mm · 1.17mm/px · 1 of 46 slices shown]
[im 1/46]
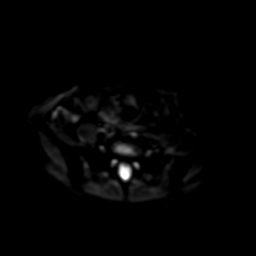

[Series 11: T1 dynamic · coronal · 3.4mm · 1.17mm/px · 3 of 112 slices shown]
[im 1/112]
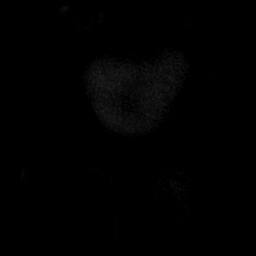
[im 56/112]
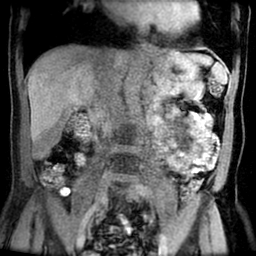
[im 112/112]
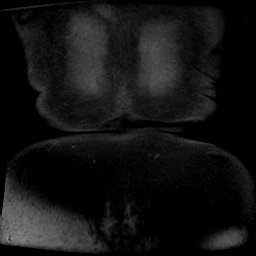

[Series 850: ADC · axial · 8.0mm · 1.17mm/px · 1 of 23 slices shown]
[im 1/23]
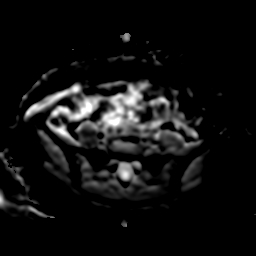

[Series 1000: T1 dynamic post-contrast · axial · non-contrast · 4.0mm · 0.59mm/px · z∈[-24,+190]mm · 3 of 108 slices shown (1 of 4)]
[im 1/108]
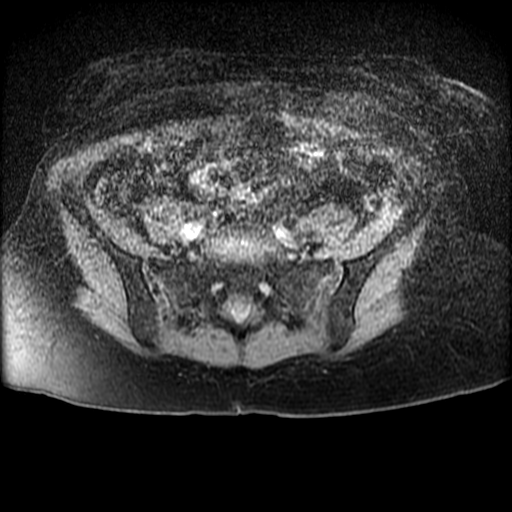
[im 54/108]
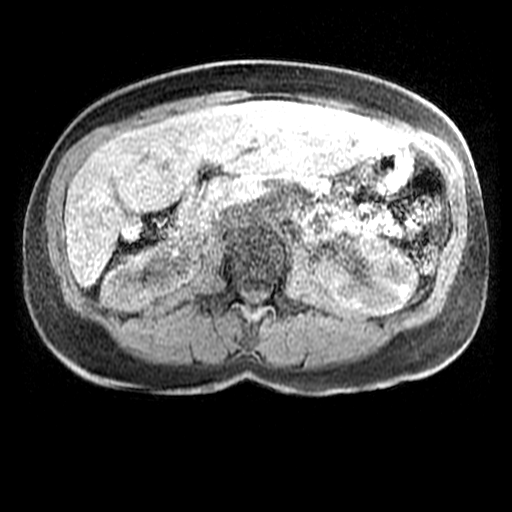
[im 108/108]
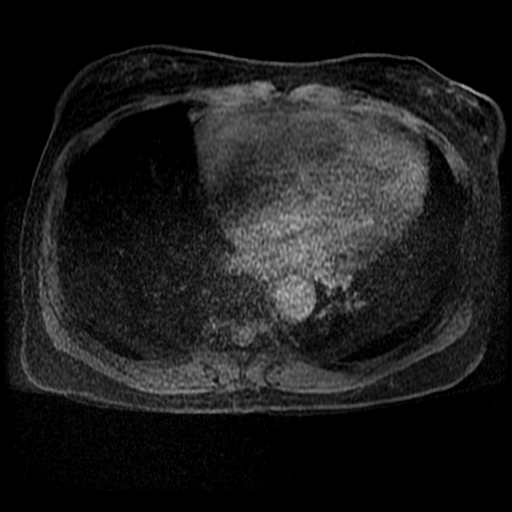

[Series 1001: T1 dynamic post-contrast · axial · non-contrast · 4.0mm · 0.59mm/px · z∈[-24,+190]mm · 3 of 108 slices shown (2 of 4)]
[im 1/108]
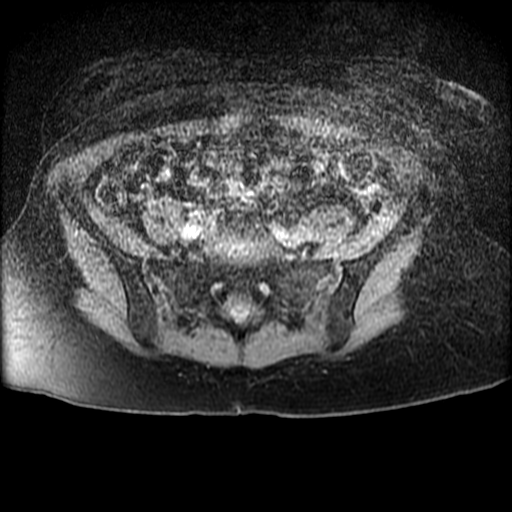
[im 54/108]
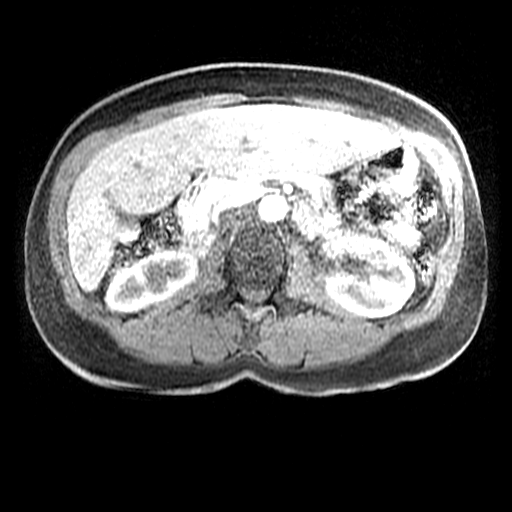
[im 108/108]
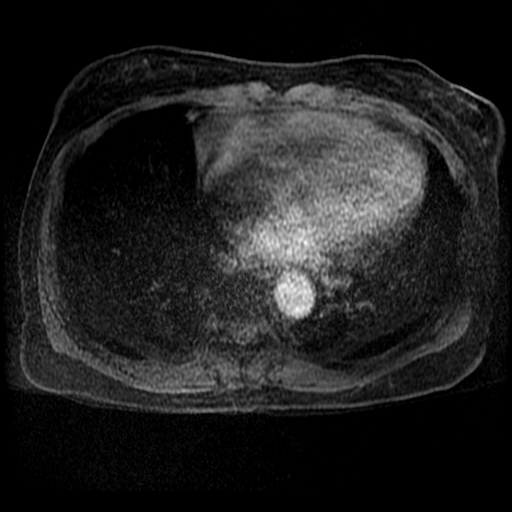

[Series 1002: T1 dynamic post-contrast · axial · non-contrast · 4.0mm · 0.59mm/px · z∈[-24,+190]mm · 3 of 108 slices shown (3 of 4)]
[im 1/108]
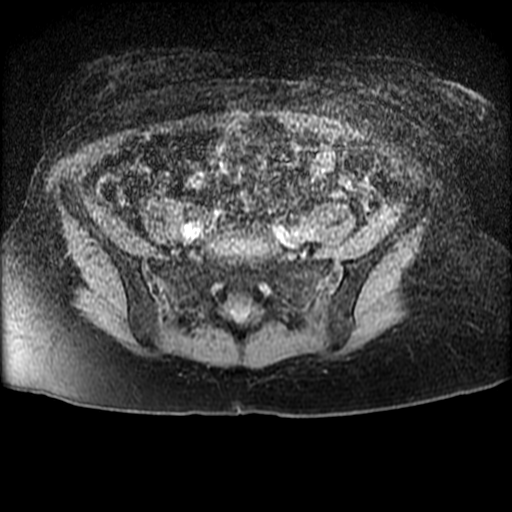
[im 54/108]
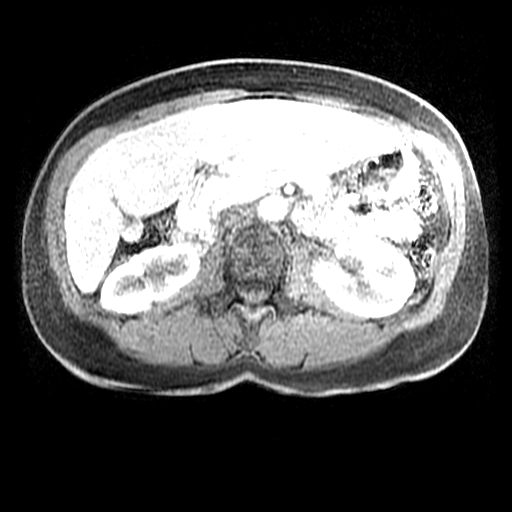
[im 108/108]
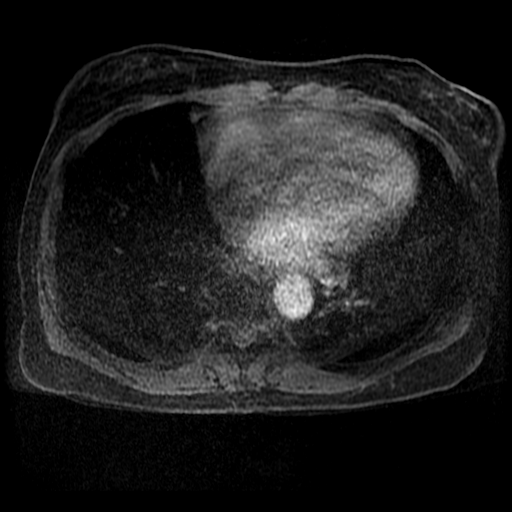

[Series 1003: T1 dynamic post-contrast · axial · non-contrast · 4.0mm · 0.59mm/px · 1 of 108 slices shown (4 of 4)]
[im 1/108]
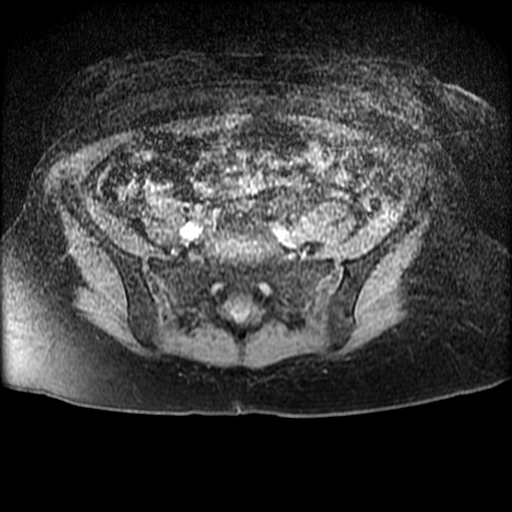

[21 of 48 positions shown; findings below may reference images not displayed]

FINDINGS: Lower chest: Mild cardiomegaly.

Hepatobiliary: Geographic high T2 and low T1 signal intensity in the
right hepatic lobe. Bridging of involvement approximately 7.2 by
by 7.4 cm today and previously by my measurements 8.6 by 7.2 by
approximately 8.2 cm. Vessels appear to pass through this lesion
compatible with an infiltrative process. No new or separate lesion
in the liver is observed.

There is prominence the lateral segment left hepatic lobe and
caudate lobe, with relative atrophy of the right hepatic lobe.
Gallbladder unremarkable.

The region of geographic infiltration enhances relatively similar to
the liver on all phases today. On the prior exam we used Eovist and
the lesion was more conspicuous on the 20 minutes images.

Pancreas:  Unremarkable

Spleen:  Unremarkable

Adrenals/Urinary Tract: There are 3 right and 6 left T1 signal
hyperintensities in the kidneys. A 0.8 by 0.6 cm high T1 signal in
the left mid kidney also has low T2 signal. Some of these complex
lesions are new compared to the prior exam, although there was also
a 2.1 cm complex lesion of the right mid kidney previously which has
resolved. There is clear misregistration on the subtraction images
making it difficult to be certain that none of these lesions are
enhancing. Because of the small size of most of the lesions, direct
signal intensity measurements are subject to volume averaging and
accordingly are not reliable. Overall I expect that these are
complex but benign cysts. All are below 1 cm in diameter and many
are below 5 mm in diameter.

Stomach/Bowel: Unremarkable

Vascular/Lymphatic:  Unremarkable

Other:  No supplemental non-categorized findings.

Musculoskeletal: Unremarkable
IMPRESSION: 1. Roughly similar appearance of the geographic infiltrative region
of high T2 and low T1 signal intensity in the right hepatic lobe.
Previous biopsy revealed granulomatous tissue in the patient has
known sarcoidosis.
2. Chronic prominence of the lateral segment left hepatic lobe and
caudate lobe. This can be an early morphologic signs of cirrhosis.
3. There are approximately 9 small complex lesions scattered in the
kidneys. These favor complex cysts although for the most part are
too small to characterize. Subtraction imaging is not highly helpful
in this case due to misregistration in the very small size of many
of these lesions. If the patient has hematuria or if otherwise
clinically warranted, surveillance imaging might be considered. Some
of these lesions are new compared to 05/13/2016 although others are
stable or have resolved from that exam.
4. Mild cardiomegaly.

## 2020-02-19 IMAGING — US US RENAL
1 series · 14 of 25 positions shown · non-contrast
Comparison: 10/13/2017 abdominal MRI

CLINICAL DATA: Acute renal failure.  Sarcoidosis

EXAM:
RENAL / URINARY TRACT ULTRASOUND COMPLETE

[Series 1: us renal · 14 of 34 slices shown]
[im 1/34]
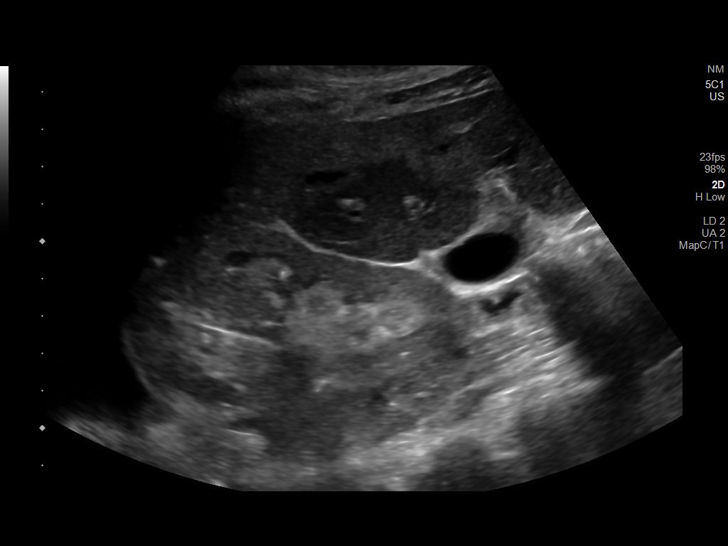
[im 3/34]
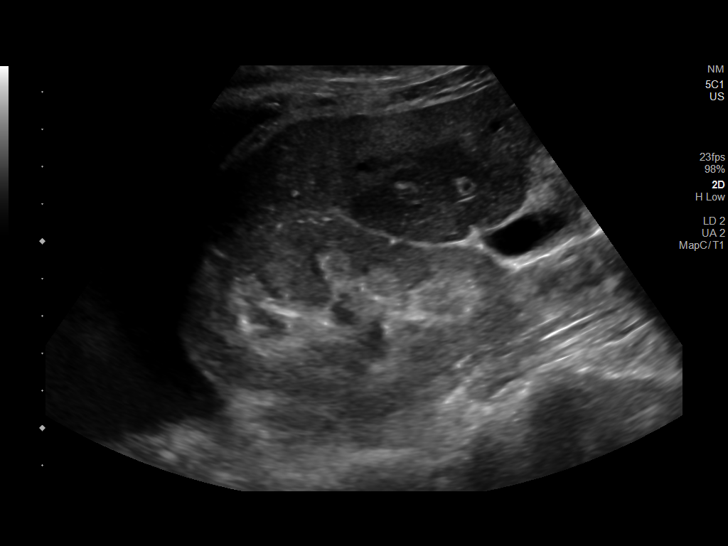
[im 6/34]
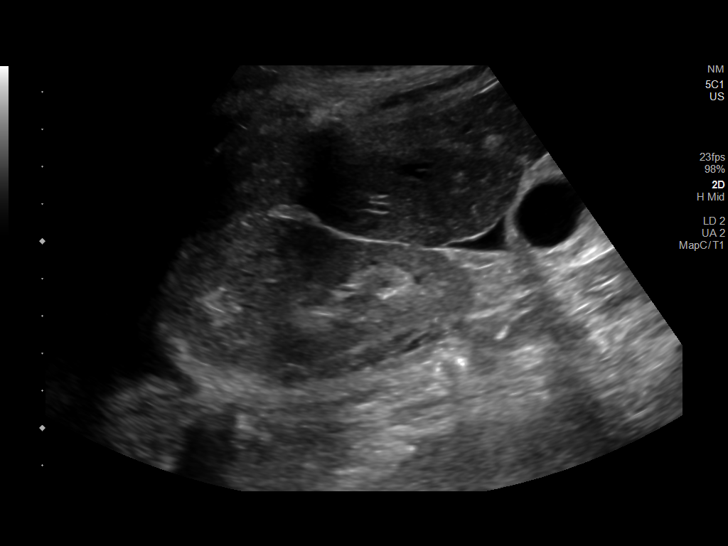
[im 9/34]
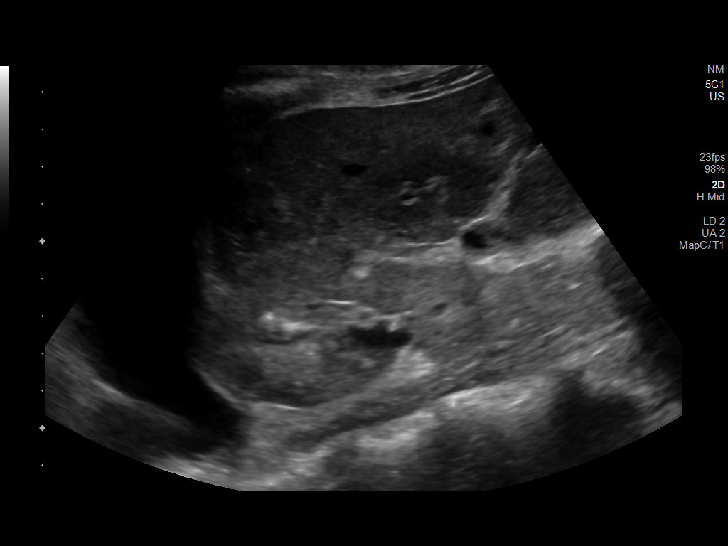
[im 12/34]
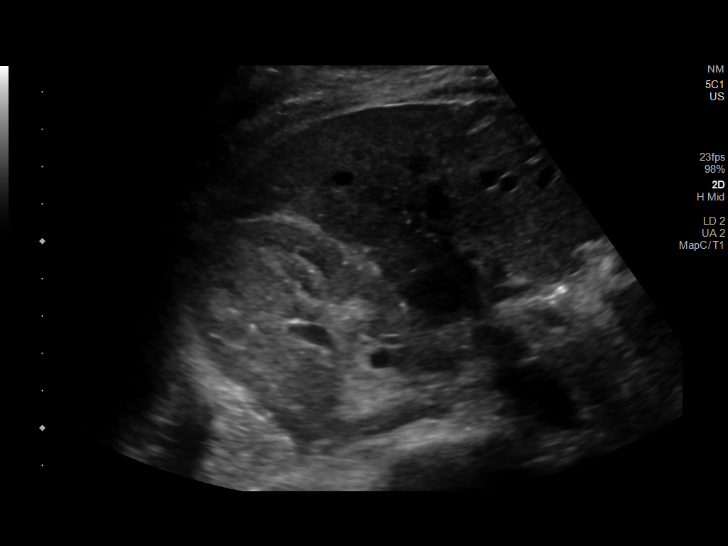
[im 13/34]
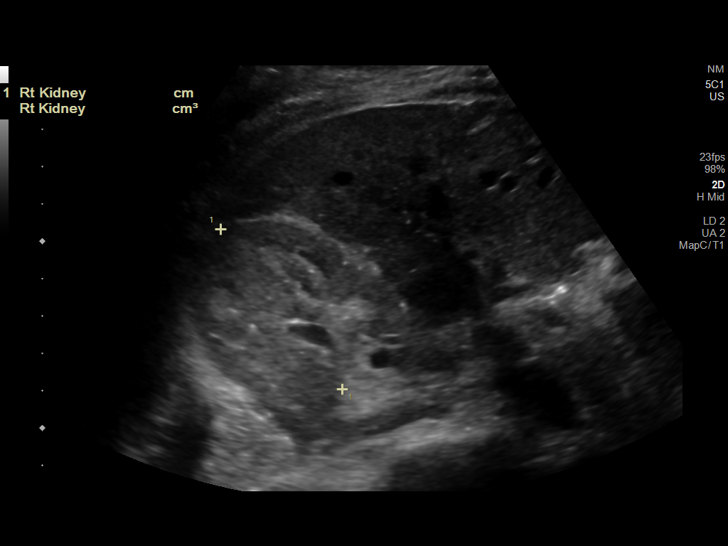
[im 16/34]
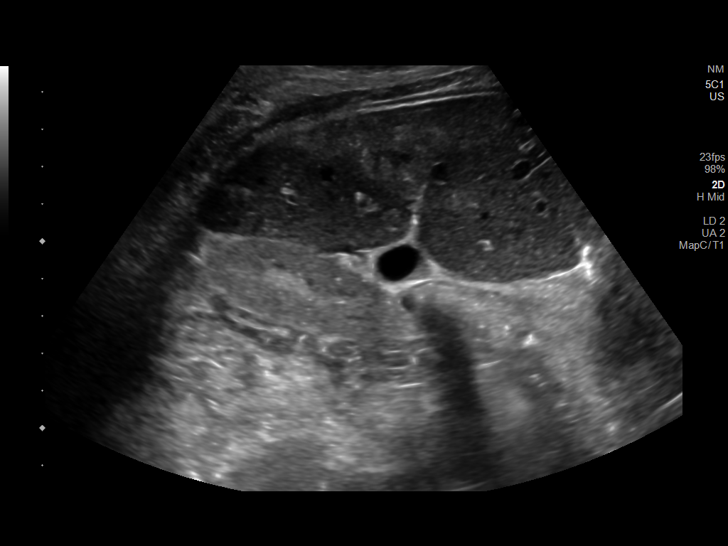
[im 18/34]
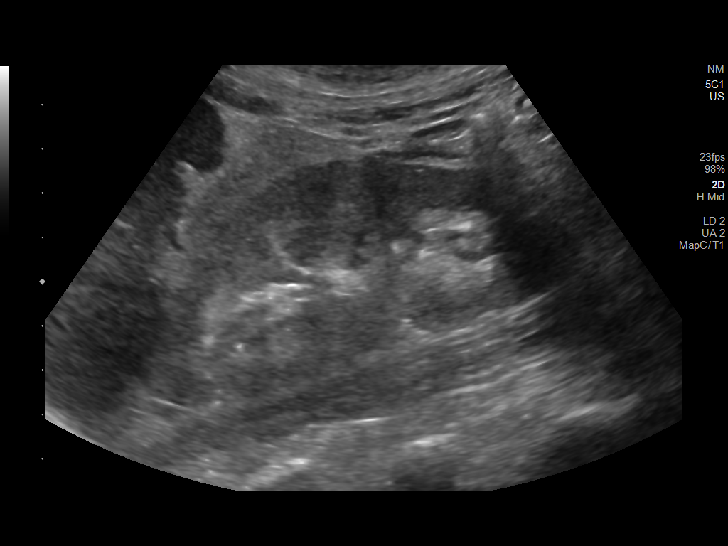
[im 21/34]
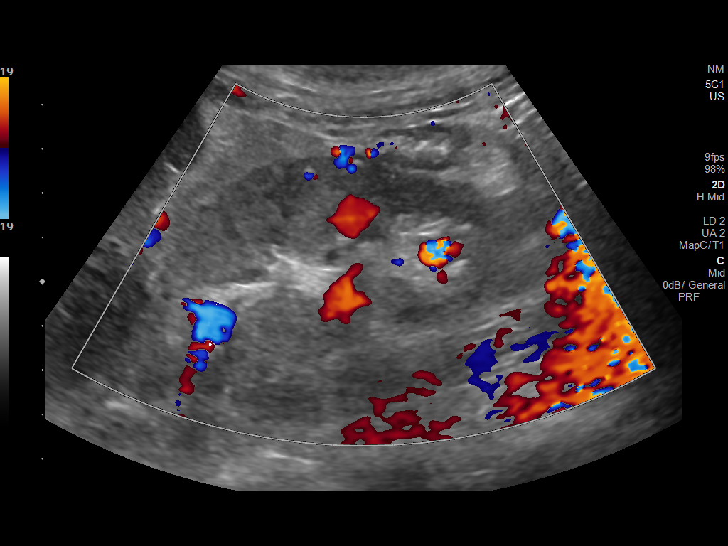
[im 23/34]
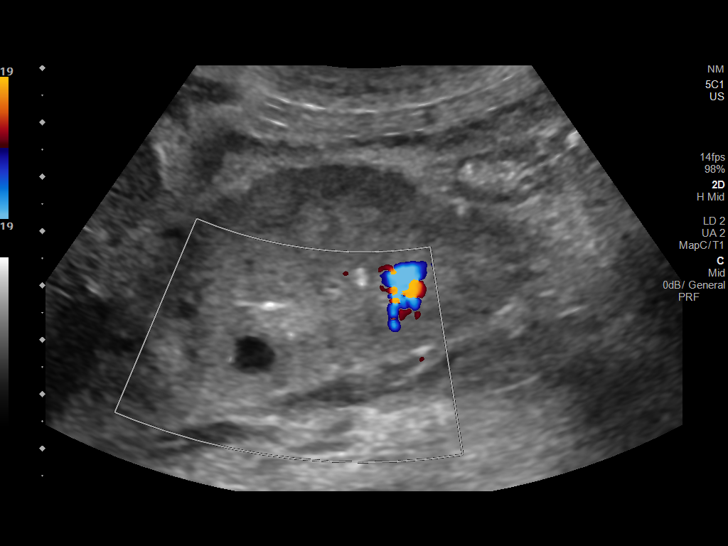
[im 25/34]
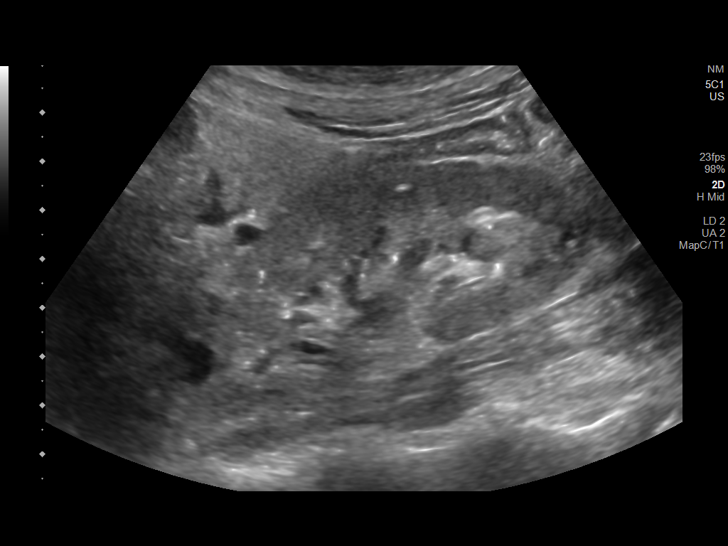
[im 28/34]
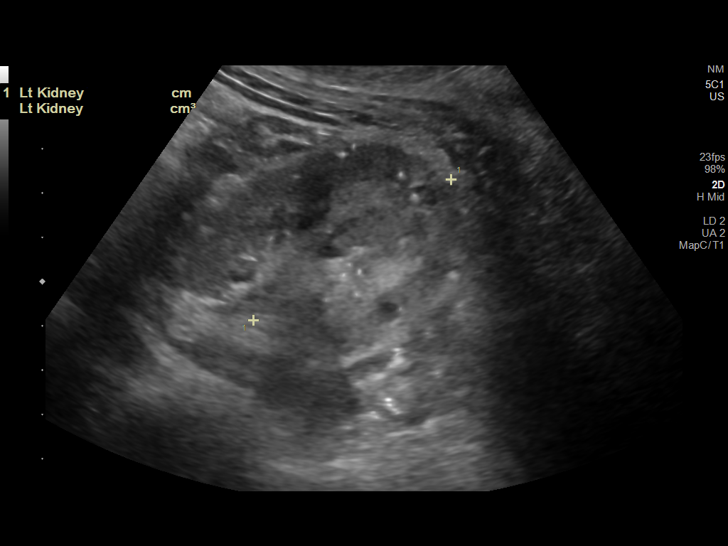
[im 31/34]
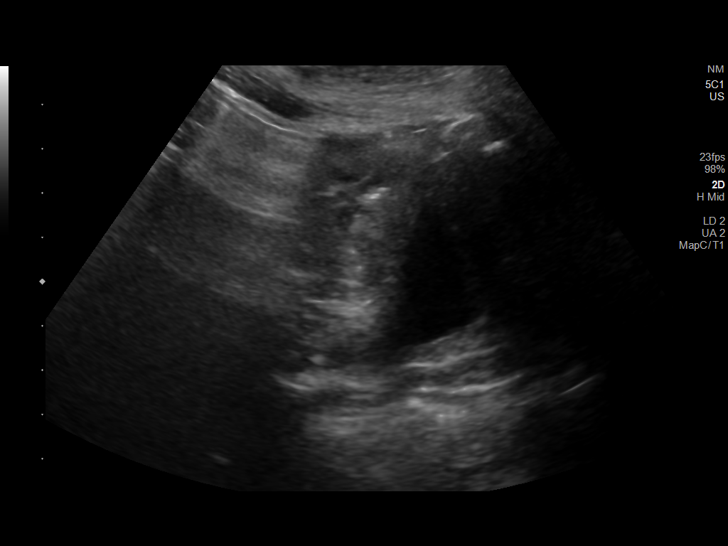
[im 34/34]
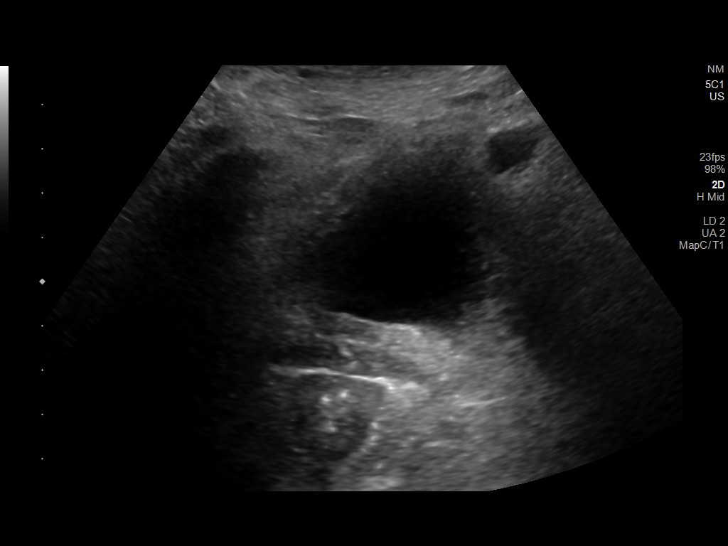

[14 of 25 positions shown; findings below may reference images not displayed]

FINDINGS: Right Kidney:

Renal measurements: 9 x 5 x 5 cm = volume: 120 mL. Increased
echogenicity. No hydronephrosis or mass.

Left Kidney:

Renal measurements: 10 x 5 x 5.5 cm = volume: 134 mL. Increased
echogenicity. Incidental 8 mm cyst. Cysts are underestimated
relative to 7457 MRI. No hydronephrosis.

Bladder:

Appears normal for degree of limited bladder distention.
IMPRESSION: Medical renal disease without hydronephrosis or mass.
